# Patient Record
Sex: Female | Born: 1935 | Race: White | Hispanic: No | Marital: Married | Smoking: Never smoker
Health system: Southern US, Community
[De-identification: ages and names within clinical notes are randomized; demographics above are authoritative.]

## PROBLEM LIST (undated history)

## (undated) DIAGNOSIS — G629 Polyneuropathy, unspecified: Secondary | ICD-10-CM

## (undated) DIAGNOSIS — S22089A Unspecified fracture of T11-T12 vertebra, initial encounter for closed fracture: Secondary | ICD-10-CM

## (undated) DIAGNOSIS — F329 Major depressive disorder, single episode, unspecified: Secondary | ICD-10-CM

## (undated) DIAGNOSIS — D0510 Intraductal carcinoma in situ of unspecified breast: Secondary | ICD-10-CM

## (undated) DIAGNOSIS — R232 Flushing: Principal | ICD-10-CM

## (undated) DIAGNOSIS — I351 Nonrheumatic aortic (valve) insufficiency: Secondary | ICD-10-CM

## (undated) DIAGNOSIS — E039 Hypothyroidism, unspecified: Secondary | ICD-10-CM

## (undated) DIAGNOSIS — R1310 Dysphagia, unspecified: Principal | ICD-10-CM

## (undated) DIAGNOSIS — I2510 Atherosclerotic heart disease of native coronary artery without angina pectoris: Secondary | ICD-10-CM

## (undated) DIAGNOSIS — C9000 Multiple myeloma not having achieved remission: Secondary | ICD-10-CM

## (undated) DIAGNOSIS — C50919 Malignant neoplasm of unspecified site of unspecified female breast: Secondary | ICD-10-CM

## (undated) DIAGNOSIS — I10 Essential (primary) hypertension: Secondary | ICD-10-CM

## (undated) DIAGNOSIS — F411 Generalized anxiety disorder: Secondary | ICD-10-CM

## (undated) HISTORY — DX: Intraductal carcinoma in situ of unspecified breast: D05.10

## (undated) HISTORY — DX: Multiple myeloma not having achieved remission: C90.00

## (undated) HISTORY — DX: Major depressive disorder, single episode, unspecified: F32.9

## (undated) HISTORY — DX: Hypothyroidism, unspecified: E03.9

## (undated) HISTORY — DX: Flushing: R23.2

## (undated) HISTORY — PX: INCISIONAL HERNIA REPAIR: SHX193

## (undated) HISTORY — DX: Dysphagia, unspecified: R13.10

## (undated) HISTORY — DX: Nonrheumatic aortic (valve) insufficiency: I35.1

## (undated) HISTORY — DX: Malignant neoplasm of unspecified site of unspecified female breast: C50.919

## (undated) HISTORY — PX: OTHER SURGICAL HISTORY: SHX169

## (undated) HISTORY — DX: Polyneuropathy, unspecified: G62.9

## (undated) HISTORY — DX: Essential (primary) hypertension: I10

---

## 1998-05-20 ENCOUNTER — Ambulatory Visit (HOSPITAL_COMMUNITY): Admission: RE | Admit: 1998-05-20 | Discharge: 1998-05-20 | Payer: Self-pay | Admitting: Emergency Medicine

## 1998-06-29 ENCOUNTER — Emergency Department (HOSPITAL_COMMUNITY): Admission: EM | Admit: 1998-06-29 | Discharge: 1998-06-29 | Payer: Self-pay

## 1998-07-24 ENCOUNTER — Observation Stay (HOSPITAL_COMMUNITY): Admission: RE | Admit: 1998-07-24 | Discharge: 1998-07-25 | Payer: Self-pay | Admitting: General Surgery

## 1998-07-24 ENCOUNTER — Encounter: Payer: Self-pay | Admitting: General Surgery

## 1999-04-13 ENCOUNTER — Other Ambulatory Visit: Admission: RE | Admit: 1999-04-13 | Discharge: 1999-04-13 | Payer: Self-pay | Admitting: Gynecology

## 1999-04-30 ENCOUNTER — Encounter: Admission: RE | Admit: 1999-04-30 | Discharge: 1999-04-30 | Payer: Self-pay | Admitting: Gynecology

## 1999-04-30 ENCOUNTER — Encounter: Payer: Self-pay | Admitting: Gynecology

## 1999-05-05 ENCOUNTER — Encounter: Admission: RE | Admit: 1999-05-05 | Discharge: 1999-05-05 | Payer: Self-pay | Admitting: Emergency Medicine

## 1999-05-05 ENCOUNTER — Encounter: Payer: Self-pay | Admitting: Emergency Medicine

## 2000-03-28 ENCOUNTER — Encounter: Payer: Self-pay | Admitting: Emergency Medicine

## 2000-03-28 ENCOUNTER — Emergency Department (HOSPITAL_COMMUNITY): Admission: EM | Admit: 2000-03-28 | Discharge: 2000-03-28 | Payer: Self-pay | Admitting: Emergency Medicine

## 2000-05-12 ENCOUNTER — Encounter: Payer: Self-pay | Admitting: Emergency Medicine

## 2000-05-12 ENCOUNTER — Encounter: Admission: RE | Admit: 2000-05-12 | Discharge: 2000-05-12 | Payer: Self-pay | Admitting: Emergency Medicine

## 2000-09-07 ENCOUNTER — Other Ambulatory Visit: Admission: RE | Admit: 2000-09-07 | Discharge: 2000-09-07 | Payer: Self-pay | Admitting: Gynecology

## 2000-09-13 ENCOUNTER — Encounter: Payer: Self-pay | Admitting: Gynecology

## 2000-09-13 ENCOUNTER — Encounter: Admission: RE | Admit: 2000-09-13 | Discharge: 2000-09-13 | Payer: Self-pay | Admitting: Gynecology

## 2001-04-22 ENCOUNTER — Encounter (INDEPENDENT_AMBULATORY_CARE_PROVIDER_SITE_OTHER): Payer: Self-pay | Admitting: *Deleted

## 2001-04-22 ENCOUNTER — Ambulatory Visit (HOSPITAL_BASED_OUTPATIENT_CLINIC_OR_DEPARTMENT_OTHER): Admission: RE | Admit: 2001-04-22 | Discharge: 2001-04-22 | Payer: Self-pay | Admitting: Surgery

## 2001-09-28 ENCOUNTER — Encounter: Payer: Self-pay | Admitting: Specialist

## 2001-09-28 ENCOUNTER — Encounter: Admission: RE | Admit: 2001-09-28 | Discharge: 2001-09-28 | Payer: Self-pay | Admitting: Specialist

## 2001-09-29 ENCOUNTER — Ambulatory Visit (HOSPITAL_BASED_OUTPATIENT_CLINIC_OR_DEPARTMENT_OTHER): Admission: RE | Admit: 2001-09-29 | Discharge: 2001-09-29 | Payer: Self-pay | Admitting: Specialist

## 2001-12-08 ENCOUNTER — Encounter: Payer: Self-pay | Admitting: Emergency Medicine

## 2001-12-08 ENCOUNTER — Encounter: Admission: RE | Admit: 2001-12-08 | Discharge: 2001-12-08 | Payer: Self-pay | Admitting: Emergency Medicine

## 2002-02-06 ENCOUNTER — Other Ambulatory Visit: Admission: RE | Admit: 2002-02-06 | Discharge: 2002-02-06 | Payer: Self-pay | Admitting: Gynecology

## 2003-02-05 ENCOUNTER — Encounter: Admission: RE | Admit: 2003-02-05 | Discharge: 2003-02-05 | Payer: Self-pay | Admitting: Obstetrics and Gynecology

## 2003-02-05 ENCOUNTER — Encounter: Payer: Self-pay | Admitting: Obstetrics and Gynecology

## 2003-06-07 ENCOUNTER — Encounter: Admission: RE | Admit: 2003-06-07 | Discharge: 2003-06-07 | Payer: Self-pay | Admitting: Emergency Medicine

## 2003-06-20 ENCOUNTER — Encounter: Admission: RE | Admit: 2003-06-20 | Discharge: 2003-06-20 | Payer: Self-pay | Admitting: Obstetrics and Gynecology

## 2003-07-04 ENCOUNTER — Encounter: Admission: RE | Admit: 2003-07-04 | Discharge: 2003-07-04 | Payer: Self-pay | Admitting: Obstetrics and Gynecology

## 2003-08-29 ENCOUNTER — Ambulatory Visit (HOSPITAL_COMMUNITY): Admission: RE | Admit: 2003-08-29 | Discharge: 2003-08-29 | Payer: Self-pay | Admitting: Gastroenterology

## 2004-07-02 ENCOUNTER — Ambulatory Visit (HOSPITAL_BASED_OUTPATIENT_CLINIC_OR_DEPARTMENT_OTHER): Admission: RE | Admit: 2004-07-02 | Discharge: 2004-07-02 | Payer: Self-pay | Admitting: General Surgery

## 2004-07-02 ENCOUNTER — Ambulatory Visit (HOSPITAL_COMMUNITY): Admission: RE | Admit: 2004-07-02 | Discharge: 2004-07-02 | Payer: Self-pay | Admitting: General Surgery

## 2004-10-07 ENCOUNTER — Encounter: Admission: RE | Admit: 2004-10-07 | Discharge: 2004-10-07 | Payer: Self-pay | Admitting: Obstetrics and Gynecology

## 2004-12-16 ENCOUNTER — Other Ambulatory Visit: Admission: RE | Admit: 2004-12-16 | Discharge: 2004-12-16 | Payer: Self-pay | Admitting: Obstetrics and Gynecology

## 2005-01-13 ENCOUNTER — Ambulatory Visit: Payer: Self-pay | Admitting: Cardiovascular Disease

## 2005-01-19 ENCOUNTER — Ambulatory Visit: Payer: Self-pay

## 2005-01-23 ENCOUNTER — Encounter: Admission: RE | Admit: 2005-01-23 | Discharge: 2005-01-23 | Payer: Self-pay | Admitting: General Surgery

## 2005-01-27 ENCOUNTER — Ambulatory Visit (HOSPITAL_COMMUNITY): Admission: RE | Admit: 2005-01-27 | Discharge: 2005-01-27 | Payer: Self-pay | Admitting: General Surgery

## 2005-01-27 ENCOUNTER — Ambulatory Visit (HOSPITAL_BASED_OUTPATIENT_CLINIC_OR_DEPARTMENT_OTHER): Admission: RE | Admit: 2005-01-27 | Discharge: 2005-01-27 | Payer: Self-pay | Admitting: General Surgery

## 2005-01-27 ENCOUNTER — Encounter (INDEPENDENT_AMBULATORY_CARE_PROVIDER_SITE_OTHER): Payer: Self-pay | Admitting: *Deleted

## 2005-03-13 ENCOUNTER — Ambulatory Visit: Payer: Self-pay | Admitting: Cardiovascular Disease

## 2005-04-17 ENCOUNTER — Ambulatory Visit: Payer: Self-pay | Admitting: Cardiovascular Disease

## 2005-06-04 ENCOUNTER — Ambulatory Visit: Payer: Self-pay

## 2005-06-04 ENCOUNTER — Encounter: Payer: Self-pay | Admitting: Internal Medicine

## 2005-06-08 ENCOUNTER — Ambulatory Visit: Payer: Self-pay | Admitting: Cardiology

## 2005-06-08 ENCOUNTER — Ambulatory Visit: Payer: Self-pay | Admitting: Cardiovascular Disease

## 2005-10-14 ENCOUNTER — Encounter: Admission: RE | Admit: 2005-10-14 | Discharge: 2005-10-14 | Payer: Self-pay | Admitting: Obstetrics and Gynecology

## 2005-12-02 ENCOUNTER — Ambulatory Visit: Payer: Self-pay | Admitting: Cardiovascular Disease

## 2006-03-11 ENCOUNTER — Ambulatory Visit: Payer: Self-pay | Admitting: Cardiovascular Disease

## 2006-05-13 ENCOUNTER — Encounter: Payer: Self-pay | Admitting: Cardiology

## 2006-05-13 ENCOUNTER — Ambulatory Visit: Payer: Self-pay

## 2006-06-08 ENCOUNTER — Ambulatory Visit: Payer: Self-pay | Admitting: Cardiovascular Disease

## 2006-10-21 ENCOUNTER — Encounter: Admission: RE | Admit: 2006-10-21 | Discharge: 2006-10-21 | Payer: Self-pay | Admitting: Family Medicine

## 2007-06-10 ENCOUNTER — Ambulatory Visit: Payer: Self-pay | Admitting: Cardiovascular Disease

## 2007-06-21 ENCOUNTER — Encounter: Payer: Self-pay | Admitting: Cardiovascular Disease

## 2007-06-21 ENCOUNTER — Ambulatory Visit: Payer: Self-pay

## 2007-08-03 ENCOUNTER — Encounter: Admission: RE | Admit: 2007-08-03 | Discharge: 2007-08-03 | Payer: Self-pay | Admitting: Obstetrics and Gynecology

## 2007-08-31 ENCOUNTER — Encounter: Admission: RE | Admit: 2007-08-31 | Discharge: 2007-08-31 | Payer: Self-pay | Admitting: Emergency Medicine

## 2007-11-17 ENCOUNTER — Ambulatory Visit: Payer: Self-pay

## 2008-01-04 ENCOUNTER — Encounter: Admission: RE | Admit: 2008-01-04 | Discharge: 2008-01-04 | Payer: Self-pay | Admitting: Obstetrics and Gynecology

## 2008-01-23 ENCOUNTER — Encounter: Admission: RE | Admit: 2008-01-23 | Discharge: 2008-01-23 | Payer: Self-pay | Admitting: General Surgery

## 2008-01-31 ENCOUNTER — Encounter: Admission: RE | Admit: 2008-01-31 | Discharge: 2008-01-31 | Payer: Self-pay | Admitting: General Surgery

## 2008-02-22 ENCOUNTER — Ambulatory Visit: Payer: Self-pay | Admitting: Hematology

## 2008-03-01 ENCOUNTER — Ambulatory Visit: Admission: RE | Admit: 2008-03-01 | Discharge: 2008-05-30 | Payer: Self-pay | Admitting: Radiation Oncology

## 2008-05-10 ENCOUNTER — Ambulatory Visit: Payer: Self-pay | Admitting: Hematology

## 2008-05-10 LAB — COMPREHENSIVE METABOLIC PANEL
ALT: 12 U/L (ref 0–35)
Albumin: 3.6 g/dL (ref 3.5–5.2)
CO2: 25 mEq/L (ref 19–32)
Calcium: 8.9 mg/dL (ref 8.4–10.5)
Chloride: 103 mEq/L (ref 96–112)
Glucose, Bld: 77 mg/dL (ref 70–99)
Potassium: 4.2 mEq/L (ref 3.5–5.3)
Sodium: 137 mEq/L (ref 135–145)
Total Protein: 6.8 g/dL (ref 6.0–8.3)

## 2008-05-10 LAB — CBC WITH DIFFERENTIAL/PLATELET
BASO%: 0.4 % (ref 0.0–2.0)
Eosinophils Absolute: 0 10*3/uL (ref 0.0–0.5)
LYMPH%: 25.5 % (ref 14.0–48.0)
MCHC: 34.8 g/dL (ref 32.0–36.0)
MONO#: 0.2 10*3/uL (ref 0.1–0.9)
NEUT#: 1.6 10*3/uL (ref 1.5–6.5)
Platelets: 189 10*3/uL (ref 145–400)
RBC: 3.34 10*6/uL — ABNORMAL LOW (ref 3.70–5.32)
RDW: 13.7 % (ref 11.3–14.5)
WBC: 2.5 10*3/uL — ABNORMAL LOW (ref 3.9–10.0)
lymph#: 0.6 10*3/uL — ABNORMAL LOW (ref 0.9–3.3)

## 2008-06-05 ENCOUNTER — Ambulatory Visit: Payer: Self-pay | Admitting: Cardiovascular Disease

## 2008-06-21 ENCOUNTER — Ambulatory Visit: Payer: Self-pay

## 2008-06-21 ENCOUNTER — Encounter: Payer: Self-pay | Admitting: Cardiovascular Disease

## 2008-08-06 ENCOUNTER — Ambulatory Visit: Payer: Self-pay | Admitting: Oncology

## 2008-08-08 LAB — COMPREHENSIVE METABOLIC PANEL
Albumin: 3.6 g/dL (ref 3.5–5.2)
Alkaline Phosphatase: 36 U/L — ABNORMAL LOW (ref 39–117)
BUN: 19 mg/dL (ref 6–23)
CO2: 23 mEq/L (ref 19–32)
Calcium: 8.2 mg/dL — ABNORMAL LOW (ref 8.4–10.5)
Chloride: 102 mEq/L (ref 96–112)
Glucose, Bld: 91 mg/dL (ref 70–99)
Potassium: 4.6 mEq/L (ref 3.5–5.3)
Sodium: 140 mEq/L (ref 135–145)
Total Protein: 6.8 g/dL (ref 6.0–8.3)

## 2008-08-08 LAB — CBC WITH DIFFERENTIAL/PLATELET
Basophils Absolute: 0 10*3/uL (ref 0.0–0.1)
Eosinophils Absolute: 0 10*3/uL (ref 0.0–0.5)
HGB: 10.9 g/dL — ABNORMAL LOW (ref 11.6–15.9)
MONO#: 0.2 10*3/uL (ref 0.1–0.9)
MONO%: 5.7 % (ref 0.0–13.0)
NEUT#: 2.4 10*3/uL (ref 1.5–6.5)
RBC: 3.08 10*6/uL — ABNORMAL LOW (ref 3.70–5.32)
RDW: 14.2 % (ref 11.3–14.5)
WBC: 3.5 10*3/uL — ABNORMAL LOW (ref 3.9–10.0)
lymph#: 0.9 10*3/uL (ref 0.9–3.3)

## 2008-08-21 ENCOUNTER — Encounter: Admission: RE | Admit: 2008-08-21 | Discharge: 2008-08-21 | Payer: Self-pay | Admitting: Oncology

## 2008-11-01 ENCOUNTER — Ambulatory Visit: Payer: Self-pay | Admitting: Oncology

## 2008-11-15 ENCOUNTER — Other Ambulatory Visit: Admission: RE | Admit: 2008-11-15 | Discharge: 2008-11-15 | Payer: Self-pay | Admitting: Oncology

## 2008-11-15 ENCOUNTER — Ambulatory Visit (HOSPITAL_COMMUNITY): Admission: RE | Admit: 2008-11-15 | Discharge: 2008-11-15 | Payer: Self-pay | Admitting: Oncology

## 2008-11-15 ENCOUNTER — Encounter: Payer: Self-pay | Admitting: Oncology

## 2008-11-15 LAB — CBC WITH DIFFERENTIAL/PLATELET
Basophils Absolute: 0 10*3/uL (ref 0.0–0.1)
EOS%: 0.4 % (ref 0.0–7.0)
Eosinophils Absolute: 0 10*3/uL (ref 0.0–0.5)
HCT: 31.4 % — ABNORMAL LOW (ref 34.8–46.6)
HGB: 10.9 g/dL — ABNORMAL LOW (ref 11.6–15.9)
MCH: 35.2 pg — ABNORMAL HIGH (ref 25.1–34.0)
NEUT#: 1.4 10*3/uL — ABNORMAL LOW (ref 1.5–6.5)
NEUT%: 59.5 % (ref 38.4–76.8)
RDW: 13.4 % (ref 11.2–14.5)
lymph#: 0.8 10*3/uL — ABNORMAL LOW (ref 0.9–3.3)

## 2008-11-16 LAB — KAPPA/LAMBDA LIGHT CHAINS
Kappa free light chain: 4.29 mg/dL — ABNORMAL HIGH (ref 0.33–1.94)
Kappa:Lambda Ratio: 3.49 — ABNORMAL HIGH (ref 0.26–1.65)
Lambda Free Lght Chn: 1.23 mg/dL (ref 0.57–2.63)

## 2008-12-17 ENCOUNTER — Ambulatory Visit: Payer: Self-pay | Admitting: Oncology

## 2008-12-19 LAB — CBC WITH DIFFERENTIAL/PLATELET
BASO%: 0 % (ref 0.0–2.0)
EOS%: 0.2 % (ref 0.0–7.0)
MCH: 35.9 pg — ABNORMAL HIGH (ref 25.1–34.0)
MCHC: 35.3 g/dL (ref 31.5–36.0)
RBC: 2.98 10*6/uL — ABNORMAL LOW (ref 3.70–5.45)
RDW: 13.6 % (ref 11.2–14.5)
lymph#: 0.7 10*3/uL — ABNORMAL LOW (ref 0.9–3.3)

## 2008-12-19 LAB — BASIC METABOLIC PANEL
Chloride: 102 mEq/L (ref 96–112)
Creatinine, Ser: 0.75 mg/dL (ref 0.40–1.20)
Potassium: 4.2 mEq/L (ref 3.5–5.3)
Sodium: 138 mEq/L (ref 135–145)

## 2008-12-24 LAB — IMMUNOFIXATION ELECTROPHORESIS
IgA: 30 mg/dL — ABNORMAL LOW (ref 68–378)
IgG (Immunoglobin G), Serum: 1960 mg/dL — ABNORMAL HIGH (ref 694–1618)

## 2009-01-03 DIAGNOSIS — L259 Unspecified contact dermatitis, unspecified cause: Secondary | ICD-10-CM

## 2009-01-03 DIAGNOSIS — M129 Arthropathy, unspecified: Secondary | ICD-10-CM | POA: Insufficient documentation

## 2009-01-03 DIAGNOSIS — Z780 Asymptomatic menopausal state: Secondary | ICD-10-CM | POA: Insufficient documentation

## 2009-01-04 ENCOUNTER — Ambulatory Visit: Payer: Self-pay | Admitting: Cardiovascular Disease

## 2009-01-04 DIAGNOSIS — C9002 Multiple myeloma in relapse: Secondary | ICD-10-CM | POA: Insufficient documentation

## 2009-01-04 DIAGNOSIS — I10 Essential (primary) hypertension: Secondary | ICD-10-CM | POA: Insufficient documentation

## 2009-01-10 LAB — COMPREHENSIVE METABOLIC PANEL
Albumin: 3.3 g/dL — ABNORMAL LOW (ref 3.5–5.2)
BUN: 19 mg/dL (ref 6–23)
Calcium: 8.7 mg/dL (ref 8.4–10.5)
Chloride: 100 mEq/L (ref 96–112)
Glucose, Bld: 91 mg/dL (ref 70–99)
Potassium: 4.5 mEq/L (ref 3.5–5.3)

## 2009-01-10 LAB — CBC WITH DIFFERENTIAL/PLATELET
Basophils Absolute: 0 10*3/uL (ref 0.0–0.1)
Eosinophils Absolute: 0 10*3/uL (ref 0.0–0.5)
HCT: 29.3 % — ABNORMAL LOW (ref 34.8–46.6)
HGB: 10.1 g/dL — ABNORMAL LOW (ref 11.6–15.9)
MCV: 100.9 fL (ref 79.5–101.0)
NEUT#: 2.6 10*3/uL (ref 1.5–6.5)
NEUT%: 77.6 % — ABNORMAL HIGH (ref 38.4–76.8)
RDW: 13.9 % (ref 11.2–14.5)
lymph#: 0.7 10*3/uL — ABNORMAL LOW (ref 0.9–3.3)

## 2009-01-10 LAB — URIC ACID: Uric Acid, Serum: 4.8 mg/dL (ref 2.4–7.0)

## 2009-01-10 LAB — LACTATE DEHYDROGENASE: LDH: 117 U/L (ref 94–250)

## 2009-01-15 ENCOUNTER — Ambulatory Visit: Payer: Self-pay | Admitting: Oncology

## 2009-01-17 LAB — COMPREHENSIVE METABOLIC PANEL
Albumin: 3.3 g/dL — ABNORMAL LOW (ref 3.5–5.2)
CO2: 26 mEq/L (ref 19–32)
Calcium: 8.5 mg/dL (ref 8.4–10.5)
Chloride: 103 mEq/L (ref 96–112)
Glucose, Bld: 90 mg/dL (ref 70–99)
Sodium: 136 mEq/L (ref 135–145)
Total Bilirubin: 0.4 mg/dL (ref 0.3–1.2)
Total Protein: 6.2 g/dL (ref 6.0–8.3)

## 2009-01-17 LAB — LACTATE DEHYDROGENASE: LDH: 130 U/L (ref 94–250)

## 2009-01-17 LAB — CBC WITH DIFFERENTIAL/PLATELET
Eosinophils Absolute: 0 10*3/uL (ref 0.0–0.5)
HCT: 28.5 % — ABNORMAL LOW (ref 34.8–46.6)
LYMPH%: 21.3 % (ref 14.0–49.7)
MONO#: 0.2 10*3/uL (ref 0.1–0.9)
NEUT#: 1.7 10*3/uL (ref 1.5–6.5)
Platelets: 110 10*3/uL — ABNORMAL LOW (ref 145–400)
RBC: 2.88 10*6/uL — ABNORMAL LOW (ref 3.70–5.45)
WBC: 2.5 10*3/uL — ABNORMAL LOW (ref 3.9–10.3)

## 2009-01-17 LAB — URIC ACID: Uric Acid, Serum: 4.1 mg/dL (ref 2.4–7.0)

## 2009-01-28 ENCOUNTER — Telehealth: Payer: Self-pay | Admitting: Cardiovascular Disease

## 2009-01-28 LAB — CBC WITH DIFFERENTIAL/PLATELET
BASO%: 1.3 % (ref 0.0–2.0)
EOS%: 0.6 % (ref 0.0–7.0)
HGB: 9.2 g/dL — ABNORMAL LOW (ref 11.6–15.9)
MCH: 33.5 pg (ref 25.1–34.0)
MCHC: 34.1 g/dL (ref 31.5–36.0)
MONO%: 4.4 % (ref 0.0–14.0)
RBC: 2.75 10*6/uL — ABNORMAL LOW (ref 3.70–5.45)
RDW: 14.8 % — ABNORMAL HIGH (ref 11.2–14.5)
lymph#: 0.7 10*3/uL — ABNORMAL LOW (ref 0.9–3.3)

## 2009-01-30 LAB — SPEP & IFE WITH QIG
Albumin ELP: 51.1 % — ABNORMAL LOW (ref 55.8–66.1)
Beta 2: 2.3 % — ABNORMAL LOW (ref 3.2–6.5)
Gamma Globulin: 24.6 % — ABNORMAL HIGH (ref 11.1–18.8)
IgA: 32 mg/dL — ABNORMAL LOW (ref 68–378)
IgM, Serum: 92 mg/dL (ref 60–263)

## 2009-01-30 LAB — KAPPA/LAMBDA LIGHT CHAINS
Kappa:Lambda Ratio: 2.34 — ABNORMAL HIGH (ref 0.26–1.65)
Lambda Free Lght Chn: 3.41 mg/dL — ABNORMAL HIGH (ref 0.57–2.63)

## 2009-01-30 LAB — COMPREHENSIVE METABOLIC PANEL
AST: 15 U/L (ref 0–37)
Alkaline Phosphatase: 48 U/L (ref 39–117)
Glucose, Bld: 87 mg/dL (ref 70–99)
Sodium: 140 mEq/L (ref 135–145)
Total Bilirubin: 0.3 mg/dL (ref 0.3–1.2)
Total Protein: 6.3 g/dL (ref 6.0–8.3)

## 2009-02-05 LAB — CBC WITH DIFFERENTIAL/PLATELET
Basophils Absolute: 0 10*3/uL (ref 0.0–0.1)
Eosinophils Absolute: 0 10*3/uL (ref 0.0–0.5)
HCT: 26.6 % — ABNORMAL LOW (ref 34.8–46.6)
HGB: 8.9 g/dL — ABNORMAL LOW (ref 11.6–15.9)
LYMPH%: 66.7 % — ABNORMAL HIGH (ref 14.0–49.7)
MCV: 98.9 fL (ref 79.5–101.0)
MONO%: 3.7 % (ref 0.0–14.0)
NEUT#: 0.3 10*3/uL — CL (ref 1.5–6.5)
NEUT%: 27.7 % — ABNORMAL LOW (ref 38.4–76.8)
Platelets: 119 10*3/uL — ABNORMAL LOW (ref 145–400)

## 2009-02-18 ENCOUNTER — Ambulatory Visit: Payer: Self-pay | Admitting: Oncology

## 2009-02-20 LAB — CBC WITH DIFFERENTIAL/PLATELET
BASO%: 0.7 % (ref 0.0–2.0)
EOS%: 0.6 % (ref 0.0–7.0)
HCT: 28.2 % — ABNORMAL LOW (ref 34.8–46.6)
MCH: 35.4 pg — ABNORMAL HIGH (ref 25.1–34.0)
MCHC: 34.3 g/dL (ref 31.5–36.0)
MONO#: 0.3 10*3/uL (ref 0.1–0.9)
NEUT%: 59.2 % (ref 38.4–76.8)
RBC: 2.73 10*6/uL — ABNORMAL LOW (ref 3.70–5.45)
WBC: 3.5 10*3/uL — ABNORMAL LOW (ref 3.9–10.3)
lymph#: 1.1 10*3/uL (ref 0.9–3.3)

## 2009-02-20 LAB — BASIC METABOLIC PANEL
Calcium: 9 mg/dL (ref 8.4–10.5)
Chloride: 103 mEq/L (ref 96–112)
Creatinine, Ser: 1.03 mg/dL (ref 0.40–1.20)
Sodium: 136 mEq/L (ref 135–145)

## 2009-02-22 ENCOUNTER — Telehealth: Payer: Self-pay | Admitting: Cardiovascular Disease

## 2009-03-13 LAB — CBC WITH DIFFERENTIAL/PLATELET
BASO%: 0.2 % (ref 0.0–2.0)
Basophils Absolute: 0 10*3/uL (ref 0.0–0.1)
EOS%: 0.9 % (ref 0.0–7.0)
HCT: 27 % — ABNORMAL LOW (ref 34.8–46.6)
HGB: 9.4 g/dL — ABNORMAL LOW (ref 11.6–15.9)
LYMPH%: 11.8 % — ABNORMAL LOW (ref 14.0–49.7)
MCH: 36.1 pg — ABNORMAL HIGH (ref 25.1–34.0)
MCHC: 35 g/dL (ref 31.5–36.0)
MCV: 103.3 fL — ABNORMAL HIGH (ref 79.5–101.0)
NEUT%: 80 % — ABNORMAL HIGH (ref 38.4–76.8)
Platelets: 181 10*3/uL (ref 145–400)

## 2009-03-18 ENCOUNTER — Ambulatory Visit: Payer: Self-pay | Admitting: Oncology

## 2009-03-20 LAB — COMPREHENSIVE METABOLIC PANEL
AST: 21 U/L (ref 0–37)
Albumin: 3.3 g/dL — ABNORMAL LOW (ref 3.5–5.2)
BUN: 19 mg/dL (ref 6–23)
Calcium: 8.8 mg/dL (ref 8.4–10.5)
Chloride: 103 mEq/L (ref 96–112)
Potassium: 3.9 mEq/L (ref 3.5–5.3)

## 2009-03-20 LAB — CBC WITH DIFFERENTIAL/PLATELET
BASO%: 0.9 % (ref 0.0–2.0)
EOS%: 2 % (ref 0.0–7.0)
MCH: 34.3 pg — ABNORMAL HIGH (ref 25.1–34.0)
MCHC: 33.8 g/dL (ref 31.5–36.0)
MCV: 101.5 fL — ABNORMAL HIGH (ref 79.5–101.0)
MONO%: 9.9 % (ref 0.0–14.0)
NEUT%: 67.3 % (ref 38.4–76.8)
RDW: 16.7 % — ABNORMAL HIGH (ref 11.2–14.5)
lymph#: 0.9 10*3/uL (ref 0.9–3.3)

## 2009-03-22 LAB — PROTEIN ELECTROPHORESIS, SERUM
Alpha-2-Globulin: 12.4 % — ABNORMAL HIGH (ref 7.1–11.8)
Beta Globulin: 5.4 % (ref 4.7–7.2)
Gamma Globulin: 18.6 % (ref 11.1–18.8)
M-Spike, %: 0.98 g/dL

## 2009-03-22 LAB — KAPPA/LAMBDA LIGHT CHAINS: Kappa free light chain: 2.34 mg/dL — ABNORMAL HIGH (ref 0.33–1.94)

## 2009-03-22 LAB — IGG, IGA, IGM: IgM, Serum: 70 mg/dL (ref 60–263)

## 2009-04-15 ENCOUNTER — Ambulatory Visit: Payer: Self-pay | Admitting: Oncology

## 2009-04-17 LAB — CBC WITH DIFFERENTIAL/PLATELET
Basophils Absolute: 0 10*3/uL (ref 0.0–0.1)
Eosinophils Absolute: 0.3 10*3/uL (ref 0.0–0.5)
HGB: 9.9 g/dL — ABNORMAL LOW (ref 11.6–15.9)
MCV: 101.7 fL — ABNORMAL HIGH (ref 79.5–101.0)
MONO#: 0.3 10*3/uL (ref 0.1–0.9)
MONO%: 10.7 % (ref 0.0–14.0)
NEUT#: 1.3 10*3/uL — ABNORMAL LOW (ref 1.5–6.5)
RDW: 14.4 % (ref 11.2–14.5)

## 2009-04-19 LAB — PROTEIN ELECTROPHORESIS, SERUM
Alpha-2-Globulin: 12.7 % — ABNORMAL HIGH (ref 7.1–11.8)
Beta Globulin: 5.6 % (ref 4.7–7.2)
Gamma Globulin: 17.3 % (ref 11.1–18.8)
M-Spike, %: 0.84 g/dL

## 2009-04-19 LAB — COMPREHENSIVE METABOLIC PANEL
AST: 16 U/L (ref 0–37)
Alkaline Phosphatase: 52 U/L (ref 39–117)
BUN: 17 mg/dL (ref 6–23)
Calcium: 8.2 mg/dL — ABNORMAL LOW (ref 8.4–10.5)
Chloride: 103 mEq/L (ref 96–112)
Creatinine, Ser: 0.72 mg/dL (ref 0.40–1.20)
Glucose, Bld: 91 mg/dL (ref 70–99)

## 2009-04-19 LAB — IGG, IGA, IGM
IgA: 46 mg/dL — ABNORMAL LOW (ref 68–378)
IgM, Serum: 76 mg/dL (ref 60–263)

## 2009-04-19 LAB — KAPPA/LAMBDA LIGHT CHAINS: Lambda Free Lght Chn: 2.1 mg/dL (ref 0.57–2.63)

## 2009-04-24 LAB — CBC WITH DIFFERENTIAL/PLATELET
Basophils Absolute: 0.1 10*3/uL (ref 0.0–0.1)
Eosinophils Absolute: 0.1 10*3/uL (ref 0.0–0.5)
HCT: 31.1 % — ABNORMAL LOW (ref 34.8–46.6)
HGB: 10.3 g/dL — ABNORMAL LOW (ref 11.6–15.9)
LYMPH%: 34.6 % (ref 14.0–49.7)
MCHC: 33.1 g/dL (ref 31.5–36.0)
MONO#: 0.3 10*3/uL (ref 0.1–0.9)
NEUT#: 1.2 10*3/uL — ABNORMAL LOW (ref 1.5–6.5)
NEUT%: 47.3 % (ref 38.4–76.8)
Platelets: 207 10*3/uL (ref 145–400)
WBC: 2.4 10*3/uL — ABNORMAL LOW (ref 3.9–10.3)

## 2009-05-15 ENCOUNTER — Ambulatory Visit: Payer: Self-pay | Admitting: Oncology

## 2009-05-15 LAB — CBC WITH DIFFERENTIAL/PLATELET
BASO%: 2.5 % — ABNORMAL HIGH (ref 0.0–2.0)
Basophils Absolute: 0.1 10*3/uL (ref 0.0–0.1)
EOS%: 3.2 % (ref 0.0–7.0)
HCT: 32.4 % — ABNORMAL LOW (ref 34.8–46.6)
LYMPH%: 22.3 % (ref 14.0–49.7)
MCH: 33.9 pg (ref 25.1–34.0)
MCHC: 33.6 g/dL (ref 31.5–36.0)
NEUT%: 57.8 % (ref 38.4–76.8)
Platelets: 144 10*3/uL — ABNORMAL LOW (ref 145–400)

## 2009-05-17 LAB — COMPREHENSIVE METABOLIC PANEL
Albumin: 3.9 g/dL (ref 3.5–5.2)
Alkaline Phosphatase: 53 U/L (ref 39–117)
BUN: 16 mg/dL (ref 6–23)
CO2: 26 mEq/L (ref 19–32)
Calcium: 8.8 mg/dL (ref 8.4–10.5)
Chloride: 100 mEq/L (ref 96–112)
Glucose, Bld: 76 mg/dL (ref 70–99)
Potassium: 4.1 mEq/L (ref 3.5–5.3)

## 2009-05-17 LAB — SPEP & IFE WITH QIG
Alpha-1-Globulin: 4.7 % (ref 2.9–4.9)
Alpha-2-Globulin: 12.6 % — ABNORMAL HIGH (ref 7.1–11.8)
Beta 2: 2.7 % — ABNORMAL LOW (ref 3.2–6.5)
Beta Globulin: 5.8 % (ref 4.7–7.2)
Gamma Globulin: 14.3 % (ref 11.1–18.8)

## 2009-05-17 LAB — KAPPA/LAMBDA LIGHT CHAINS
Kappa free light chain: 1.79 mg/dL (ref 0.33–1.94)
Kappa:Lambda Ratio: 1.13 (ref 0.26–1.65)
Lambda Free Lght Chn: 1.58 mg/dL (ref 0.57–2.63)

## 2009-06-03 ENCOUNTER — Encounter: Payer: Self-pay | Admitting: Cardiovascular Disease

## 2009-06-04 ENCOUNTER — Ambulatory Visit: Payer: Self-pay

## 2009-06-04 ENCOUNTER — Ambulatory Visit (HOSPITAL_COMMUNITY): Admission: RE | Admit: 2009-06-04 | Discharge: 2009-06-04 | Payer: Self-pay | Admitting: Cardiovascular Disease

## 2009-06-04 ENCOUNTER — Ambulatory Visit: Payer: Self-pay | Admitting: Cardiology

## 2009-06-04 ENCOUNTER — Encounter: Payer: Self-pay | Admitting: Cardiovascular Disease

## 2009-06-05 ENCOUNTER — Ambulatory Visit: Payer: Self-pay | Admitting: Cardiovascular Disease

## 2009-06-05 DIAGNOSIS — I447 Left bundle-branch block, unspecified: Secondary | ICD-10-CM

## 2009-06-13 ENCOUNTER — Ambulatory Visit: Payer: Self-pay | Admitting: Oncology

## 2009-06-18 LAB — CBC WITH DIFFERENTIAL/PLATELET
Basophils Absolute: 0 10*3/uL (ref 0.0–0.1)
EOS%: 6.1 % (ref 0.0–7.0)
HGB: 10 g/dL — ABNORMAL LOW (ref 11.6–15.9)
MCH: 32.8 pg (ref 25.1–34.0)
MCV: 100.7 fL (ref 79.5–101.0)
MONO%: 17.8 % — ABNORMAL HIGH (ref 0.0–14.0)
NEUT#: 1.4 10*3/uL — ABNORMAL LOW (ref 1.5–6.5)
RBC: 3.05 10*6/uL — ABNORMAL LOW (ref 3.70–5.45)
RDW: 15.6 % — ABNORMAL HIGH (ref 11.2–14.5)
lymph#: 0.6 10*3/uL — ABNORMAL LOW (ref 0.9–3.3)
nRBC: 0 % (ref 0–0)

## 2009-06-20 LAB — PROTEIN ELECTROPHORESIS, SERUM
Albumin ELP: 63.4 % (ref 55.8–66.1)
Alpha-1-Globulin: 4.7 % (ref 2.9–4.9)
Alpha-2-Globulin: 11.4 % (ref 7.1–11.8)
Beta 2: 2.1 % — ABNORMAL LOW (ref 3.2–6.5)
Gamma Globulin: 12.6 % (ref 11.1–18.8)

## 2009-06-20 LAB — COMPREHENSIVE METABOLIC PANEL
AST: 30 U/L (ref 0–37)
Albumin: 3.5 g/dL (ref 3.5–5.2)
Alkaline Phosphatase: 48 U/L (ref 39–117)
Glucose, Bld: 84 mg/dL (ref 70–99)
Potassium: 4.1 mEq/L (ref 3.5–5.3)
Sodium: 137 mEq/L (ref 135–145)
Total Bilirubin: 0.5 mg/dL (ref 0.3–1.2)
Total Protein: 5.6 g/dL — ABNORMAL LOW (ref 6.0–8.3)

## 2009-06-20 LAB — IGG, IGA, IGM
IgA: 41 mg/dL — ABNORMAL LOW (ref 68–378)
IgM, Serum: 79 mg/dL (ref 60–263)

## 2009-06-20 LAB — KAPPA/LAMBDA LIGHT CHAINS: Lambda Free Lght Chn: 2.03 mg/dL (ref 0.57–2.63)

## 2009-06-21 ENCOUNTER — Encounter: Payer: Self-pay | Admitting: Oncology

## 2009-06-21 ENCOUNTER — Ambulatory Visit: Admission: RE | Admit: 2009-06-21 | Discharge: 2009-06-21 | Payer: Self-pay | Admitting: Oncology

## 2009-06-21 ENCOUNTER — Ambulatory Visit: Payer: Self-pay | Admitting: Vascular Surgery

## 2009-07-11 ENCOUNTER — Ambulatory Visit: Payer: Self-pay | Admitting: Oncology

## 2009-07-15 LAB — CBC WITH DIFFERENTIAL/PLATELET
BASO%: 1.2 % (ref 0.0–2.0)
Basophils Absolute: 0 10*3/uL (ref 0.0–0.1)
EOS%: 4.2 % (ref 0.0–7.0)
Eosinophils Absolute: 0.1 10*3/uL (ref 0.0–0.5)
MONO#: 0.5 10*3/uL (ref 0.1–0.9)
MONO%: 13.9 % (ref 0.0–14.0)
NEUT%: 56.1 % (ref 38.4–76.8)
RDW: 15.5 % — ABNORMAL HIGH (ref 11.2–14.5)
WBC: 3.4 10*3/uL — ABNORMAL LOW (ref 3.9–10.3)
lymph#: 0.8 10*3/uL — ABNORMAL LOW (ref 0.9–3.3)
nRBC: 0 % (ref 0–0)

## 2009-07-17 LAB — PROTEIN ELECTROPHORESIS, SERUM
Alpha-1-Globulin: 4.2 % (ref 2.9–4.9)
Beta Globulin: 5.6 % (ref 4.7–7.2)
Gamma Globulin: 12.6 % (ref 11.1–18.8)
M-Spike, %: 0.41 g/dL
Total Protein, Serum Electrophoresis: 5.4 g/dL — ABNORMAL LOW (ref 6.0–8.3)

## 2009-07-17 LAB — COMPREHENSIVE METABOLIC PANEL
ALT: 23 U/L (ref 0–35)
BUN: 15 mg/dL (ref 6–23)
CO2: 26 mEq/L (ref 19–32)
Chloride: 103 mEq/L (ref 96–112)
Creatinine, Ser: 0.93 mg/dL (ref 0.40–1.20)
Glucose, Bld: 83 mg/dL (ref 70–99)
Sodium: 136 mEq/L (ref 135–145)

## 2009-07-26 ENCOUNTER — Telehealth: Payer: Self-pay | Admitting: Cardiovascular Disease

## 2009-08-12 ENCOUNTER — Ambulatory Visit: Payer: Self-pay | Admitting: Oncology

## 2009-08-12 LAB — CBC WITH DIFFERENTIAL/PLATELET
BASO%: 0.5 % (ref 0.0–2.0)
EOS%: 1.1 % (ref 0.0–7.0)
HGB: 11.1 g/dL — ABNORMAL LOW (ref 11.6–15.9)
MCH: 33.3 pg (ref 25.1–34.0)
MCHC: 33.3 g/dL (ref 31.5–36.0)
RBC: 3.33 10*6/uL — ABNORMAL LOW (ref 3.70–5.45)
RDW: 15.3 % — ABNORMAL HIGH (ref 11.2–14.5)
lymph#: 1.2 10*3/uL (ref 0.9–3.3)
nRBC: 0 % (ref 0–0)

## 2009-08-12 LAB — COMPREHENSIVE METABOLIC PANEL
AST: 29 U/L (ref 0–37)
Albumin: 3.7 g/dL (ref 3.5–5.2)
Alkaline Phosphatase: 50 U/L (ref 39–117)
Potassium: 4 mEq/L (ref 3.5–5.3)
Sodium: 134 mEq/L — ABNORMAL LOW (ref 135–145)
Total Protein: 6.5 g/dL (ref 6.0–8.3)

## 2009-08-14 LAB — PROTEIN ELECTROPHORESIS, SERUM
Beta 2: 3.4 % (ref 3.2–6.5)
Beta Globulin: 5.5 % (ref 4.7–7.2)
Gamma Globulin: 14.3 % (ref 11.1–18.8)

## 2009-08-22 ENCOUNTER — Encounter: Admission: RE | Admit: 2009-08-22 | Discharge: 2009-08-22 | Payer: Self-pay | Admitting: Oncology

## 2009-09-09 LAB — CBC WITH DIFFERENTIAL/PLATELET
BASO%: 1 % (ref 0.0–2.0)
EOS%: 2 % (ref 0.0–7.0)
HCT: 31 % — ABNORMAL LOW (ref 34.8–46.6)
LYMPH%: 28.8 % (ref 14.0–49.7)
MCH: 33 pg (ref 25.1–34.0)
MCHC: 33.5 g/dL (ref 31.5–36.0)
MONO%: 8.6 % (ref 0.0–14.0)
NEUT%: 59.6 % (ref 38.4–76.8)
lymph#: 1.1 10*3/uL (ref 0.9–3.3)

## 2009-09-09 LAB — BASIC METABOLIC PANEL
BUN: 17 mg/dL (ref 6–23)
Potassium: 4.5 mEq/L (ref 3.5–5.3)
Sodium: 135 mEq/L (ref 135–145)

## 2009-09-11 ENCOUNTER — Ambulatory Visit: Payer: Self-pay | Admitting: Oncology

## 2009-09-11 LAB — IGG, IGA, IGM
IgA: 75 mg/dL (ref 68–378)
IgG (Immunoglobin G), Serum: 785 mg/dL (ref 694–1618)

## 2009-09-11 LAB — PROTEIN ELECTROPHORESIS, SERUM
Alpha-1-Globulin: 4.5 % (ref 2.9–4.9)
Beta 2: 3.2 % (ref 3.2–6.5)
Gamma Globulin: 15.4 % (ref 11.1–18.8)

## 2009-09-11 LAB — KAPPA/LAMBDA LIGHT CHAINS
Kappa free light chain: 1.81 mg/dL (ref 0.33–1.94)
Lambda Free Lght Chn: 2.97 mg/dL — ABNORMAL HIGH (ref 0.57–2.63)

## 2009-09-12 ENCOUNTER — Ambulatory Visit (HOSPITAL_COMMUNITY): Admission: RE | Admit: 2009-09-12 | Discharge: 2009-09-12 | Payer: Self-pay | Admitting: Oncology

## 2009-10-07 LAB — CBC WITH DIFFERENTIAL/PLATELET
BASO%: 1.8 % (ref 0.0–2.0)
EOS%: 5.7 % (ref 0.0–7.0)
MCH: 32.7 pg (ref 25.1–34.0)
MCV: 98.4 fL (ref 79.5–101.0)
MONO%: 12.9 % (ref 0.0–14.0)
RBC: 3.06 10*6/uL — ABNORMAL LOW (ref 3.70–5.45)
RDW: 15 % — ABNORMAL HIGH (ref 11.2–14.5)

## 2009-10-07 LAB — COMPREHENSIVE METABOLIC PANEL
AST: 23 U/L (ref 0–37)
BUN: 12 mg/dL (ref 6–23)
Calcium: 8.8 mg/dL (ref 8.4–10.5)
Chloride: 100 mEq/L (ref 96–112)
Creatinine, Ser: 0.69 mg/dL (ref 0.40–1.20)
Total Bilirubin: 0.8 mg/dL (ref 0.3–1.2)

## 2009-10-09 LAB — KAPPA/LAMBDA LIGHT CHAINS
Kappa free light chain: 1.71 mg/dL (ref 0.33–1.94)
Lambda Free Lght Chn: 2.6 mg/dL (ref 0.57–2.63)

## 2009-10-09 LAB — IGG, IGA, IGM
IgG (Immunoglobin G), Serum: 649 mg/dL — ABNORMAL LOW (ref 694–1618)
IgM, Serum: 96 mg/dL (ref 60–263)

## 2009-10-09 LAB — PROTEIN ELECTROPHORESIS, SERUM
Albumin ELP: 62 % (ref 55.8–66.1)
Alpha-2-Globulin: 12.9 % — ABNORMAL HIGH (ref 7.1–11.8)
M-Spike, %: 0.38 g/dL
Total Protein, Serum Electrophoresis: 5.6 g/dL — ABNORMAL LOW (ref 6.0–8.3)

## 2009-10-16 ENCOUNTER — Encounter (INDEPENDENT_AMBULATORY_CARE_PROVIDER_SITE_OTHER): Payer: Self-pay | Admitting: *Deleted

## 2009-11-01 ENCOUNTER — Ambulatory Visit: Payer: Self-pay | Admitting: Oncology

## 2009-11-04 LAB — CBC WITH DIFFERENTIAL/PLATELET
BASO%: 0.6 % (ref 0.0–2.0)
HCT: 30.8 % — ABNORMAL LOW (ref 34.8–46.6)
LYMPH%: 18.4 % (ref 14.0–49.7)
MCH: 33.3 pg (ref 25.1–34.0)
MCHC: 33.8 g/dL (ref 31.5–36.0)
MCV: 98.7 fL (ref 79.5–101.0)
MONO%: 16.2 % — ABNORMAL HIGH (ref 0.0–14.0)
NEUT%: 62.3 % (ref 38.4–76.8)
Platelets: 132 10*3/uL — ABNORMAL LOW (ref 145–400)
RBC: 3.12 10*6/uL — ABNORMAL LOW (ref 3.70–5.45)
nRBC: 0 % (ref 0–0)

## 2009-11-04 LAB — COMPREHENSIVE METABOLIC PANEL
ALT: 24 U/L (ref 0–35)
AST: 22 U/L (ref 0–37)
BUN: 14 mg/dL (ref 6–23)
Calcium: 8.3 mg/dL — ABNORMAL LOW (ref 8.4–10.5)
Creatinine, Ser: 0.71 mg/dL (ref 0.40–1.20)
Total Bilirubin: 0.7 mg/dL (ref 0.3–1.2)

## 2009-11-06 LAB — PROTEIN ELECTROPHORESIS, SERUM
Alpha-1-Globulin: 6.7 % — ABNORMAL HIGH (ref 2.9–4.9)
Alpha-2-Globulin: 12.4 % — ABNORMAL HIGH (ref 7.1–11.8)
Beta Globulin: 6.2 % (ref 4.7–7.2)
Gamma Globulin: 10.2 % — ABNORMAL LOW (ref 11.1–18.8)
M-Spike, %: 0.31 g/dL
Total Protein, Serum Electrophoresis: 5.8 g/dL — ABNORMAL LOW (ref 6.0–8.3)

## 2009-11-06 LAB — KAPPA/LAMBDA LIGHT CHAINS
Kappa free light chain: 1.34 mg/dL (ref 0.33–1.94)
Lambda Free Lght Chn: 1.37 mg/dL (ref 0.57–2.63)

## 2009-11-29 ENCOUNTER — Ambulatory Visit: Payer: Self-pay | Admitting: Oncology

## 2009-12-03 LAB — COMPREHENSIVE METABOLIC PANEL
ALT: 27 U/L (ref 0–35)
Albumin: 3.5 g/dL (ref 3.5–5.2)
CO2: 28 mEq/L (ref 19–32)
Calcium: 8.8 mg/dL (ref 8.4–10.5)
Chloride: 100 mEq/L (ref 96–112)
Glucose, Bld: 124 mg/dL — ABNORMAL HIGH (ref 70–99)
Potassium: 3.6 mEq/L (ref 3.5–5.3)
Sodium: 136 mEq/L (ref 135–145)
Total Bilirubin: 0.6 mg/dL (ref 0.3–1.2)
Total Protein: 6.4 g/dL (ref 6.0–8.3)

## 2009-12-03 LAB — CBC WITH DIFFERENTIAL/PLATELET
Basophils Absolute: 0 10*3/uL (ref 0.0–0.1)
Eosinophils Absolute: 0 10*3/uL (ref 0.0–0.5)
HGB: 11.2 g/dL — ABNORMAL LOW (ref 11.6–15.9)
MCV: 97.9 fL (ref 79.5–101.0)
MONO#: 0.1 10*3/uL (ref 0.1–0.9)
NEUT#: 2.4 10*3/uL (ref 1.5–6.5)
RBC: 3.35 10*6/uL — ABNORMAL LOW (ref 3.70–5.45)
RDW: 15.5 % — ABNORMAL HIGH (ref 11.2–14.5)
WBC: 2.7 10*3/uL — ABNORMAL LOW (ref 3.9–10.3)
nRBC: 0 % (ref 0–0)

## 2009-12-05 LAB — KAPPA/LAMBDA LIGHT CHAINS: Lambda Free Lght Chn: 1.9 mg/dL (ref 0.57–2.63)

## 2009-12-05 LAB — PROTEIN ELECTROPHORESIS, SERUM
Albumin ELP: 59.5 % (ref 55.8–66.1)
Beta Globulin: 6.9 % (ref 4.7–7.2)
M-Spike, %: 0.26 g/dL
Total Protein, Serum Electrophoresis: 6.2 g/dL (ref 6.0–8.3)

## 2009-12-18 ENCOUNTER — Ambulatory Visit: Payer: Self-pay | Admitting: Cardiovascular Disease

## 2010-01-01 ENCOUNTER — Ambulatory Visit: Payer: Self-pay | Admitting: Oncology

## 2010-01-03 LAB — CBC WITH DIFFERENTIAL/PLATELET
Eosinophils Absolute: 0.2 10*3/uL (ref 0.0–0.5)
HCT: 30.6 % — ABNORMAL LOW (ref 34.8–46.6)
LYMPH%: 19.4 % (ref 14.0–49.7)
MCHC: 33.3 g/dL (ref 31.5–36.0)
MONO#: 0.6 10*3/uL (ref 0.1–0.9)
NEUT#: 1.5 10*3/uL (ref 1.5–6.5)
NEUT%: 50.2 % (ref 38.4–76.8)
Platelets: 191 10*3/uL (ref 145–400)
WBC: 3 10*3/uL — ABNORMAL LOW (ref 3.9–10.3)

## 2010-01-03 LAB — COMPREHENSIVE METABOLIC PANEL
BUN: 18 mg/dL (ref 6–23)
CO2: 26 mEq/L (ref 19–32)
Calcium: 8.5 mg/dL (ref 8.4–10.5)
Chloride: 103 mEq/L (ref 96–112)
Creatinine, Ser: 0.88 mg/dL (ref 0.40–1.20)
Glucose, Bld: 87 mg/dL (ref 70–99)

## 2010-01-08 LAB — KAPPA/LAMBDA LIGHT CHAINS
Kappa free light chain: 1.2 mg/dL (ref 0.33–1.94)
Lambda Free Lght Chn: 1.74 mg/dL (ref 0.57–2.63)

## 2010-01-08 LAB — PROTEIN ELECTROPHORESIS, SERUM
Alpha-1-Globulin: 6.7 % — ABNORMAL HIGH (ref 2.9–4.9)
Alpha-2-Globulin: 13.9 % — ABNORMAL HIGH (ref 7.1–11.8)
Total Protein, Serum Electrophoresis: 6.2 g/dL (ref 6.0–8.3)

## 2010-01-31 ENCOUNTER — Ambulatory Visit: Payer: Self-pay | Admitting: Oncology

## 2010-01-31 LAB — CBC WITH DIFFERENTIAL/PLATELET
BASO%: 0.3 % (ref 0.0–2.0)
Eosinophils Absolute: 0 10*3/uL (ref 0.0–0.5)
MCHC: 33.4 g/dL (ref 31.5–36.0)
MCV: 98.5 fL (ref 79.5–101.0)
MONO#: 0.1 10*3/uL (ref 0.1–0.9)
MONO%: 1.7 % (ref 0.0–14.0)
NEUT#: 3.4 10*3/uL (ref 1.5–6.5)
RBC: 3.28 10*6/uL — ABNORMAL LOW (ref 3.70–5.45)
RDW: 15.8 % — ABNORMAL HIGH (ref 11.2–14.5)
WBC: 3.6 10*3/uL — ABNORMAL LOW (ref 3.9–10.3)
nRBC: 0 % (ref 0–0)

## 2010-01-31 LAB — COMPREHENSIVE METABOLIC PANEL
ALT: 21 U/L (ref 0–35)
AST: 30 U/L (ref 0–37)
Alkaline Phosphatase: 59 U/L (ref 39–117)
CO2: 25 mEq/L (ref 19–32)
Sodium: 137 mEq/L (ref 135–145)
Total Bilirubin: 0.7 mg/dL (ref 0.3–1.2)
Total Protein: 5.7 g/dL — ABNORMAL LOW (ref 6.0–8.3)

## 2010-01-31 LAB — MAGNESIUM: Magnesium: 1.8 mg/dL (ref 1.5–2.5)

## 2010-02-04 LAB — PROTEIN ELECTROPHORESIS, SERUM
Alpha-1-Globulin: 4.7 % (ref 2.9–4.9)
Alpha-2-Globulin: 12.2 % — ABNORMAL HIGH (ref 7.1–11.8)
Beta 2: 3.5 % (ref 3.2–6.5)
Beta Globulin: 6.3 % (ref 4.7–7.2)
Gamma Globulin: 9.3 % — ABNORMAL LOW (ref 11.1–18.8)
M-Spike, %: 0.2 g/dL

## 2010-02-28 LAB — CBC WITH DIFFERENTIAL/PLATELET
BASO%: 0.2 % (ref 0.0–2.0)
EOS%: 0.2 % (ref 0.0–7.0)
MCH: 32.2 pg (ref 25.1–34.0)
MCHC: 33 g/dL (ref 31.5–36.0)
MCV: 97.6 fL (ref 79.5–101.0)
MONO%: 2.7 % (ref 0.0–14.0)
RBC: 3.35 10*6/uL — ABNORMAL LOW (ref 3.70–5.45)
RDW: 15.3 % — ABNORMAL HIGH (ref 11.2–14.5)

## 2010-03-04 LAB — PROTEIN ELECTROPHORESIS, SERUM
Alpha-1-Globulin: 6 % — ABNORMAL HIGH (ref 2.9–4.9)
Beta 2: 3.3 % (ref 3.2–6.5)
Gamma Globulin: 9.3 % — ABNORMAL LOW (ref 11.1–18.8)

## 2010-03-04 LAB — COMPREHENSIVE METABOLIC PANEL
ALT: 20 U/L (ref 0–35)
AST: 23 U/L (ref 0–37)
Albumin: 3.7 g/dL (ref 3.5–5.2)
Alkaline Phosphatase: 65 U/L (ref 39–117)
BUN: 17 mg/dL (ref 6–23)
Potassium: 4.1 mEq/L (ref 3.5–5.3)
Sodium: 137 mEq/L (ref 135–145)

## 2010-03-06 ENCOUNTER — Ambulatory Visit: Payer: Self-pay | Admitting: Oncology

## 2010-03-28 LAB — CBC WITH DIFFERENTIAL/PLATELET
Basophils Absolute: 0 10*3/uL (ref 0.0–0.1)
EOS%: 7 % (ref 0.0–7.0)
Eosinophils Absolute: 0.2 10*3/uL (ref 0.0–0.5)
LYMPH%: 23.6 % (ref 14.0–49.7)
MCH: 32.7 pg (ref 25.1–34.0)
MCV: 98.1 fL (ref 79.5–101.0)
MONO%: 18.7 % — ABNORMAL HIGH (ref 0.0–14.0)
NEUT#: 1.4 10*3/uL — ABNORMAL LOW (ref 1.5–6.5)
Platelets: 158 10*3/uL (ref 145–400)
RBC: 3.18 10*6/uL — ABNORMAL LOW (ref 3.70–5.45)
RDW: 15.9 % — ABNORMAL HIGH (ref 11.2–14.5)

## 2010-03-28 LAB — BASIC METABOLIC PANEL
BUN: 12 mg/dL (ref 6–23)
Calcium: 8.6 mg/dL (ref 8.4–10.5)
Glucose, Bld: 89 mg/dL (ref 70–99)
Sodium: 138 mEq/L (ref 135–145)

## 2010-04-01 LAB — KAPPA/LAMBDA LIGHT CHAINS
Kappa free light chain: 1.29 mg/dL (ref 0.33–1.94)
Kappa:Lambda Ratio: 0.65 (ref 0.26–1.65)
Lambda Free Lght Chn: 1.97 mg/dL (ref 0.57–2.63)

## 2010-04-01 LAB — PROTEIN ELECTROPHORESIS, SERUM
Albumin ELP: 64.4 % (ref 55.8–66.1)
M-Spike, %: 0.18 g/dL
Total Protein, Serum Electrophoresis: 5.6 g/dL — ABNORMAL LOW (ref 6.0–8.3)

## 2010-04-23 ENCOUNTER — Ambulatory Visit: Payer: Self-pay | Admitting: Oncology

## 2010-04-25 LAB — CBC WITH DIFFERENTIAL/PLATELET
BASO%: 0.3 % (ref 0.0–2.0)
Basophils Absolute: 0 10*3/uL (ref 0.0–0.1)
Eosinophils Absolute: 0.1 10*3/uL (ref 0.0–0.5)
HGB: 10.5 g/dL — ABNORMAL LOW (ref 11.6–15.9)
LYMPH%: 21.9 % (ref 14.0–49.7)
MCV: 97.5 fL (ref 79.5–101.0)
MONO%: 20.3 % — ABNORMAL HIGH (ref 0.0–14.0)
NEUT#: 2.1 10*3/uL (ref 1.5–6.5)
NEUT%: 55.7 % (ref 38.4–76.8)
RBC: 3.26 10*6/uL — ABNORMAL LOW (ref 3.70–5.45)
RDW: 15.9 % — ABNORMAL HIGH (ref 11.2–14.5)
WBC: 3.8 10*3/uL — ABNORMAL LOW (ref 3.9–10.3)

## 2010-04-25 LAB — BASIC METABOLIC PANEL
CO2: 32 mEq/L (ref 19–32)
Chloride: 101 mEq/L (ref 96–112)
Glucose, Bld: 86 mg/dL (ref 70–99)
Potassium: 3.5 mEq/L (ref 3.5–5.3)
Sodium: 137 mEq/L (ref 135–145)

## 2010-04-29 LAB — PROTEIN ELECTROPHORESIS, SERUM
Albumin ELP: 63.7 % (ref 55.8–66.1)
Beta 2: 3.1 % — ABNORMAL LOW (ref 3.2–6.5)
Beta Globulin: 6.5 % (ref 4.7–7.2)
M-Spike, %: 0.19 g/dL

## 2010-04-29 LAB — KAPPA/LAMBDA LIGHT CHAINS
Kappa free light chain: 1.23 mg/dL (ref 0.33–1.94)
Kappa:Lambda Ratio: 0.55 (ref 0.26–1.65)

## 2010-05-23 ENCOUNTER — Ambulatory Visit: Payer: Self-pay | Admitting: Oncology

## 2010-05-23 LAB — CBC WITH DIFFERENTIAL/PLATELET
Basophils Absolute: 0 10*3/uL (ref 0.0–0.1)
EOS%: 1.8 % (ref 0.0–7.0)
HCT: 32.3 % — ABNORMAL LOW (ref 34.8–46.6)
HGB: 10.9 g/dL — ABNORMAL LOW (ref 11.6–15.9)
MCH: 32.8 pg (ref 25.1–34.0)
MONO#: 0.6 10*3/uL (ref 0.1–0.9)
NEUT#: 2.5 10*3/uL (ref 1.5–6.5)
NEUT%: 63.3 % (ref 38.4–76.8)
RDW: 15.5 % — ABNORMAL HIGH (ref 11.2–14.5)
WBC: 4 10*3/uL (ref 3.9–10.3)
lymph#: 0.8 10*3/uL — ABNORMAL LOW (ref 0.9–3.3)

## 2010-05-23 LAB — BASIC METABOLIC PANEL
BUN: 18 mg/dL (ref 6–23)
Chloride: 102 mEq/L (ref 96–112)
Potassium: 4.1 mEq/L (ref 3.5–5.3)
Sodium: 137 mEq/L (ref 135–145)

## 2010-05-28 LAB — PROTEIN ELECTROPHORESIS, SERUM
Albumin ELP: 64.5 % (ref 55.8–66.1)
Alpha-1-Globulin: 4.7 % (ref 2.9–4.9)
Alpha-2-Globulin: 11.4 % (ref 7.1–11.8)
Beta 2: 3.5 % (ref 3.2–6.5)
Beta Globulin: 6.1 % (ref 4.7–7.2)
Total Protein, Serum Electrophoresis: 5.7 g/dL — ABNORMAL LOW (ref 6.0–8.3)

## 2010-05-28 LAB — KAPPA/LAMBDA LIGHT CHAINS
Kappa:Lambda Ratio: 0.61 (ref 0.26–1.65)
Lambda Free Lght Chn: 1.52 mg/dL (ref 0.57–2.63)

## 2010-06-20 LAB — CBC WITH DIFFERENTIAL/PLATELET
BASO%: 0.6 % (ref 0.0–2.0)
EOS%: 2.4 % (ref 0.0–7.0)
HCT: 32.4 % — ABNORMAL LOW (ref 34.8–46.6)
MCH: 33.5 pg (ref 25.1–34.0)
MCHC: 34.1 g/dL (ref 31.5–36.0)
MCV: 98.4 fL (ref 79.5–101.0)
MONO%: 18.9 % — ABNORMAL HIGH (ref 0.0–14.0)
NEUT%: 54.5 % (ref 38.4–76.8)
lymph#: 0.6 10*3/uL — ABNORMAL LOW (ref 0.9–3.3)

## 2010-06-20 LAB — BASIC METABOLIC PANEL
BUN: 18 mg/dL (ref 6–23)
Calcium: 9.1 mg/dL (ref 8.4–10.5)
Chloride: 104 mEq/L (ref 96–112)
Creatinine, Ser: 0.77 mg/dL (ref 0.40–1.20)

## 2010-06-25 ENCOUNTER — Ambulatory Visit: Payer: Self-pay | Admitting: Cardiovascular Disease

## 2010-06-25 ENCOUNTER — Ambulatory Visit (HOSPITAL_COMMUNITY)
Admission: RE | Admit: 2010-06-25 | Discharge: 2010-06-25 | Payer: Self-pay | Source: Home / Self Care | Attending: Cardiovascular Disease | Admitting: Cardiovascular Disease

## 2010-06-25 ENCOUNTER — Encounter: Payer: Self-pay | Admitting: Cardiovascular Disease

## 2010-06-25 ENCOUNTER — Ambulatory Visit: Payer: Self-pay

## 2010-06-25 DIAGNOSIS — G608 Other hereditary and idiopathic neuropathies: Secondary | ICD-10-CM | POA: Insufficient documentation

## 2010-07-04 ENCOUNTER — Ambulatory Visit
Admission: RE | Admit: 2010-07-04 | Discharge: 2010-07-04 | Payer: Self-pay | Source: Home / Self Care | Attending: Vascular Surgery | Admitting: Vascular Surgery

## 2010-07-16 ENCOUNTER — Ambulatory Visit: Payer: Self-pay | Admitting: Oncology

## 2010-07-18 LAB — BASIC METABOLIC PANEL
BUN: 17 mg/dL (ref 6–23)
CO2: 27 mEq/L (ref 19–32)
Calcium: 8.8 mg/dL (ref 8.4–10.5)
Chloride: 103 mEq/L (ref 96–112)
Creatinine, Ser: 0.72 mg/dL (ref 0.40–1.20)
Glucose, Bld: 93 mg/dL (ref 70–99)
Potassium: 5.5 mEq/L — ABNORMAL HIGH (ref 3.5–5.3)
Sodium: 137 mEq/L (ref 135–145)

## 2010-07-18 LAB — CBC WITH DIFFERENTIAL/PLATELET
BASO%: 1.3 % (ref 0.0–2.0)
Basophils Absolute: 0 10*3/uL (ref 0.0–0.1)
EOS%: 1.6 % (ref 0.0–7.0)
Eosinophils Absolute: 0.1 10*3/uL (ref 0.0–0.5)
HCT: 32.3 % — ABNORMAL LOW (ref 34.8–46.6)
HGB: 11.1 g/dL — ABNORMAL LOW (ref 11.6–15.9)
LYMPH%: 19.1 % (ref 14.0–49.7)
MCH: 33.7 pg (ref 25.1–34.0)
MCHC: 34.5 g/dL (ref 31.5–36.0)
MCV: 97.9 fL (ref 79.5–101.0)
MONO#: 0.5 10*3/uL (ref 0.1–0.9)
MONO%: 13.1 % (ref 0.0–14.0)
NEUT#: 2.4 10*3/uL (ref 1.5–6.5)
NEUT%: 64.9 % (ref 38.4–76.8)
Platelets: 143 10*3/uL — ABNORMAL LOW (ref 145–400)
RBC: 3.3 10*6/uL — ABNORMAL LOW (ref 3.70–5.45)
RDW: 15.9 % — ABNORMAL HIGH (ref 11.2–14.5)
WBC: 3.7 10*3/uL — ABNORMAL LOW (ref 3.9–10.3)
lymph#: 0.7 10*3/uL — ABNORMAL LOW (ref 0.9–3.3)

## 2010-07-26 ENCOUNTER — Other Ambulatory Visit: Payer: Self-pay | Admitting: Oncology

## 2010-07-26 ENCOUNTER — Encounter: Payer: Self-pay | Admitting: Obstetrics and Gynecology

## 2010-07-26 DIAGNOSIS — Z9889 Other specified postprocedural states: Secondary | ICD-10-CM

## 2010-07-28 ENCOUNTER — Encounter: Payer: Self-pay | Admitting: Oncology

## 2010-08-07 ENCOUNTER — Other Ambulatory Visit: Payer: Self-pay | Admitting: Oncology

## 2010-08-07 DIAGNOSIS — Z9889 Other specified postprocedural states: Secondary | ICD-10-CM

## 2010-08-07 NOTE — Letter (Signed)
Summary: Appointment - Reminder 2  [employer], Main Office  [address]   [place], [place] [address]   Phone: [phone]  Fax: [phone]     October 16, 2009 MRN: [id number]   [facility] [address] [place], [place]  [address]   Dear Ms. Gillum,  Our records indicate that it is time to schedule a follow-up appointment with Dr. Eden Emms. It is very important that we reach you to schedule this appointment. We look forward to participating in your health care needs. Please contact us at the number listed above at your earliest convenience to schedule your appointment.  If you are unable to make an appointment at this time, give [place] a call so we can update our records.     Sincerely,  [facility] D[facility] Scheduling Team

## 2010-08-07 NOTE — Assessment & Plan Note (Signed)
Summary: 6 months/sl/need echo sameday per nishan/sl   Primary Natasha Jackson:  Dr. Jarold Motto  CC:  pt stopped taken diovan due to fatigue.  History of Present Illness: Natasha Jackson is seen today for F/U of her AR and HTN.  She has been through a lot lately.  She had her therapy for breast cancer including XRT.  unfortunately she has now been diagnosed with multiple myeloma.  This was worked up because of anemia.  As confirmed by bone marrow biopsy.  She apparently has bone lesions on her forehead and right leg.  His been seen for second opinion at [facility].  Her M spike is declining and her disease seems to be stabilized for now..  Her cardiac standpoint her hypertension is well controlled.  She has moderate aortic insufficiency.  I don't think the XRT would've involved her aortic valve as they are able to target radiation therapy very well these days..  She has not had significant LV cavity dilatation or dysfunction in the past.  She has not had any exertional dyspnea PND or orthopnea no palpitations or syncope.  Her functional status is still excellent. I reviewed her echo from today and she has mild LVE, moderate AR with trileaflet valve and moderate MR which is a little worse than I remember her having  Current Problems (verified): 1)  Left Bundle Branch Block  (ICD-426.3) 2)  Multiple Myeloma  (ICD-203.00) 3)  Essential Hypertension, Benign  (ICD-401.1) 4)  Arthritis  (ICD-716.90) 5)  Dermatitis  (ICD-692.9) 6)  Postmenopausal Status  (ICD-V49.81) 7)  Aortic Insufficiency  (ICD-424.1)  Current Medications (verified): 1)  Multivitamins   Tabs (Multiple Vitamin) .[name].. 1 Tab By Mouth Once Daily 2)  Revlimid  (Lenalidomide) .[name].. 1 Tab By Mouth Once Daily For 21 Days Then Wait 8 Days Then Start Again 3)  Aspirin Ec 325 Mg Tbec (Aspirin) .... Take One Tablet By Mouth Daily 4)  Levothyroxine Sodium 25 Mcg Tabs (Levothyroxine Sodium) .[name].. 1 Tab By Mouth Once Daily 5)  Diovan 160 Mg Tabs (Valsartan) .... Has  Not Taken For A Couple Months Take One Tablet By Mouth Daily  Allergies (verified): No Known Drug Allergies  Past History:  Past Medical History: Last updated: 01/04/2009 Breast Cancer Multiple Myeloma Aortic Regurgitattion Hypertension  Past Surgical History: Last updated: 01/03/2009  Excision of lymph node, left groin.  Repair of incisional hernia at the umbilicus with mesh.  Colonoscopy.  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   Vital Signs:  Patient profile:   77 year old female Height:      65 inches Weight:      128 pounds BMI:     21.38 Pulse rate:   66 / minute Resp:     12 per minute BP sitting:   134 / 54  (left arm)  Vitals Entered By: Kem Parkinson (June 25, 2010 1:55 PM)  Physical Exam  General:  Affect appropriate Healthy:  appears stated age HEENT: normal Neck supple with no adenopathy JVP normal no bruits no thyromegaly Lungs clear with no wheezing and good diaphragmatic motion Heart:  S1/S2 AR murmur  murmur,rub, gallop or click  PMI normal Abdomen: benighn, BS positve, no tenderness, no AAA no bruit.  No HSM or HJR Distal pulses intact with no bruits No edema Neuro non-focal Skin warm and dry    Impression & Recommendations:  Problem # 1:  ESSENTIAL HYPERTENSION, BENIGN (ICD-401.1) Well contolled  Her updated medication list for this problem includes:    Aspirin Ec 325 Mg Tbec (Aspirin) .[name]... Take one tablet by mouth daily    Diovan 160 Mg Tabs (Valsartan)  ..... Has not taken for a couple months take one tablet by mouth daily  Orders: EKG w/ Interpretation (93000)  Problem # 2:  MULTIPLE  MYELOMA (ICD-203.00) F/U Dr Stephani Police bone survey stable and on maint Rx.  F.U lab work oncology  Problem # 3:  AORTIC INSUFFICIENCY (ICD-424.1) Moderate with compensated ventricle no indication for surgery.  Prognosis limited by myeloma and age Her updated medication list for this problem includes:    Diovan 160 Mg Tabs (Valsartan) ..... Has not taken for a couple months take one tablet by mouth daily  Problem # 4:  OTHER SPECIFIED IDIOPATHIC PERIPHERAL NEUROPATHY (ICD-356.8) Complains of tingling in feet.  Pulses are fine  F/U primary consider trial of neurontin or gabipentin  Patient Instructions: 1)  Your physician recommends that you schedule a follow-up appointment in: 6 months with Dr. Eden Emms 2)  Your physician recommends that you continue on your current medications as directed. Please refer to the Current Medication list given to you today.

## 2010-08-07 NOTE — Progress Notes (Signed)
Summary: new medication - pennsaid   Phone Note Call from Patient Call back at Home Phone [phone]   Caller: Patient Reason for Call: Talk to Nurse Details for Reason: pt went to pcp on 1/20 gave pt pennsaid -  cream . pt read side effect, regarding heart problem. would like an opinion on  should she use this or not.  Initial call taken by: Lorne Skeens,  July 26, 2009 4:17 PM  Follow-up for Phone Call        spoke with pt, she has only used the cream once and was concerned about the side effects. will foward to dr Eden Emms for his review Deliah Goody, RN  July 26, 2009 4:31 PM   Additional Follow-up for Phone Call Additional follow up Details #1::        No history of CAD.  Should be ok to take it especially topically Additional Follow-up by: Colon Branch, MD, [facility],  July 28, 2009 9:31 PM     Appended Document: new medication - pennsaid  pt aware

## 2010-08-07 NOTE — Assessment & Plan Note (Signed)
Summary: f27m/rsc per pt call from NOS on 6/2/lg   Primary Provider:  Dr. Jarold Motto  CC:  Check up.  History of Present Illness: Natasha Jackson is seen today for F/U of her AR and HTN.  She has been through a lot lately.  She had her therapy for breast cancer including XRT.  unfortunately she has now been diagnosed with multiple myeloma.  This was worked up because of anemia.  As confirmed by bone marrow biopsy.  She apparently has bone lesions on her forehead and right leg.  His been seen for second opinion at [facility].  Her M spike is declining and her disease seems to be stabilized for now..  Her cardiac standpoint her hypertension is well controlled.  She has moderate aortic insufficiency.  I don't think the XRT would've involved her aortic valve as they are able to target radiation therapy very well these days..  She has not had significant LV cavity dilatation or dysfunction in the past.  She has not had any exertional dyspnea PND or orthopnea no palpitations or syncope.  Her functional status is still excellent.  I reviewed her echo from 06/04/2009 and her AR continues to be moderate with a compensated ventricle.  She will have a F/U echo in 6 months  Current Problems (verified): 1)  Left Bundle Branch Block  (ICD-426.3) 2)  Multiple Myeloma  (ICD-203.00) 3)  Essential Hypertension, Benign  (ICD-401.1) 4)  Arthritis  (ICD-716.90) 5)  Dermatitis  (ICD-692.9) 6)  Postmenopausal Status  (ICD-V49.81) 7)  Aortic Insufficiency  (ICD-424.1)  Current Medications (verified): 1)  Multivitamins   Tabs (Multiple Vitamin) .[name].. 1 Tab By Mouth Once Daily 2)  Prednisone 20 Mg Tabs (Prednisone) .... 1/2 Tab   Once Weekly 3)  Revlimid  (Lenalidomide) .[name].. 1 Tab By Mouth Once Daily For 21 Days Then Wait 8 Days Then Start Again 4)  Aspirin Ec 325 Mg Tbec (Aspirin) .... Take One Tablet By Mouth Daily 5)  Levothyroxine Sodium 25 Mcg Tabs (Levothyroxine Sodium) .[name].. 1 Tab By Mouth Once Daily 6)  Diovan 160 Mg Tabs  (Valsartan) .... Take One Tablet By Mouth Daily  Allergies (verified): No Known Drug Allergies  Past History:  Past Medical History: Last updated: 01/04/2009 Breast Cancer Multiple Myeloma Aortic Regurgitattion Hypertension  Past Surgical History: Last updated: 01/03/2009  Excision of lymph node, left groin.  Repair of incisional hernia at the umbilicus with mesh.  Colonoscopy.  Family History: Last updated: 01/04/2009 non-contributory  Social History: Last updated: 01/04/2009 Non-smoker Non-drinker Active  Review of Systems       Denies fever, malais, weight loss, blurry vision, decreased visual acuity, cough, sputum, SOB, hemoptysis, pleuritic pain, palpitaitons, heartburn, abdominal pain, melena, lower extremity edema, claudication, or rash.   Vital Signs:  Patient profile:   77 year old female Height:      65 inches Weight:      128 pounds BMI:     21.38 Pulse rate:   59 / minute BP sitting:   130 / 57  (left arm)  Vitals Entered By: Kem Parkinson (December 18, 2009 11:23 AM)  Physical Exam  General:  Affect appropriate Healthy:  appears stated age HEENT: normal Neck supple with no adenopathy JVP normal no bruits no thyromegaly Lungs clear with no wheezing and good diaphragmatic motion no rub, gallop or click PMI normal Abdomen: benighn, BS positve, no tenderness, no AAA no bruit.  No HSM or HJR Distal pulses intact with no bruits No edema Neuro non-focal Skin  warm and dry    Impression & Recommendations:  Problem # 1:  ESSENTIAL HYPERTENSION, BENIGN (ICD-401.1)  Well controlled Her updated medication list for this problem includes:    Aspirin Ec 325 Mg Tbec (Aspirin) .[name]... Take one tablet by mouth daily    Diovan 160 Mg Tabs (Valsartan) .[name]... Take one tablet by mouth daily  Her updated medication list for this problem includes:    Aspirin Ec 325 Mg Tbec (Aspirin) .[name]... Take one tablet by mouth daily    Diovan 160 Mg Tabs (Valsartan) .[name]...  Take one tablet by mouth daily  Problem # 2:  LEFT BUNDLE BRANCH BLOCK (ICD-426.3) Chronic no change from 11/10  no evidence of high grade heart block Her updated medication list for this problem includes:    Aspirin Ec 325 Mg Tbec (Aspirin) .[name]... Take one tablet by mouth daily  Problem # 3:  AORTIC INSUFFICIENCY (ICD-424.1) Pulse pressure not wide, and no change in murmur. F/U echo in 6 months Continue afterload reduction with ARB Her updated medication list for this problem includes:    Diovan 160 Mg Tabs (Valsartan) .[name]... Take one tablet by mouth daily  Orders: Echocardiogram (Echo)  Problem # 4:  MULTIPLE  MYELOMA (ICD-203.00) Continue F[facility]U at [facility].  Will likely get repeat bone survey and surveilance SPEP and UPEP Follow for worsening anema  Patient Instructions: 1)  Your physician recommends that you schedule a follow-up appointment in: 6 months with dr Eden Emms echo same day 2)  Your physician recommends that you continue on your current medications as directed. Please refer to the Current Medication list given to you today. 3)  Your physician has requested that you have an echocardiogram.  Echocardiography is a painless test that uses sound waves to create images of your heart. It provides your doctor with information about the size and shape of your heart and how well your heart's chambers and valves are working.  This procedure takes approximately one hour. There are no restrictions for this procedure.   EKG Report  Procedure date:  06/05/2009  Findings:      NSR 52 LAD LBBB

## 2010-08-15 ENCOUNTER — Other Ambulatory Visit: Payer: Self-pay | Admitting: Oncology

## 2010-08-15 ENCOUNTER — Encounter (HOSPITAL_BASED_OUTPATIENT_CLINIC_OR_DEPARTMENT_OTHER): Payer: Medicare Other | Admitting: Oncology

## 2010-08-15 DIAGNOSIS — C9000 Multiple myeloma not having achieved remission: Secondary | ICD-10-CM

## 2010-08-15 DIAGNOSIS — D0590 Unspecified type of carcinoma in situ of unspecified breast: Secondary | ICD-10-CM

## 2010-08-15 LAB — COMPREHENSIVE METABOLIC PANEL
AST: 29 U/L (ref 0–37)
BUN: 18 mg/dL (ref 6–23)
CO2: 30 mEq/L (ref 19–32)
Calcium: 9.1 mg/dL (ref 8.4–10.5)
Chloride: 102 mEq/L (ref 96–112)
Glucose, Bld: 93 mg/dL (ref 70–99)
Potassium: 3.9 mEq/L (ref 3.5–5.3)
Sodium: 139 mEq/L (ref 135–145)
Total Bilirubin: 0.7 mg/dL (ref 0.3–1.2)

## 2010-08-15 LAB — CBC WITH DIFFERENTIAL/PLATELET
HCT: 32.5 % — ABNORMAL LOW (ref 34.8–46.6)
HGB: 11 g/dL — ABNORMAL LOW (ref 11.6–15.9)
LYMPH%: 21.8 % (ref 14.0–49.7)
MCV: 97.6 fL (ref 79.5–101.0)
NEUT#: 2.1 10*3/uL (ref 1.5–6.5)
RBC: 3.34 10*6/uL — ABNORMAL LOW (ref 3.70–5.45)
lymph#: 0.7 10*3/uL — ABNORMAL LOW (ref 0.9–3.3)

## 2010-08-19 LAB — PROTEIN ELECTROPHORESIS, SERUM
Alpha-1-Globulin: 4.6 % (ref 2.9–4.9)
Alpha-2-Globulin: 11.7 % (ref 7.1–11.8)
Beta 2: 3.7 % (ref 3.2–6.5)
Gamma Globulin: 11.9 % (ref 11.1–18.8)
Total Protein, Serum Electrophoresis: 6.1 g/dL (ref 6.0–8.3)

## 2010-08-19 LAB — KAPPA/LAMBDA LIGHT CHAINS: Kappa:Lambda Ratio: 1.24 (ref 0.26–1.65)

## 2010-08-22 ENCOUNTER — Encounter (HOSPITAL_BASED_OUTPATIENT_CLINIC_OR_DEPARTMENT_OTHER): Payer: Medicare Other | Admitting: Oncology

## 2010-08-22 DIAGNOSIS — D0590 Unspecified type of carcinoma in situ of unspecified breast: Secondary | ICD-10-CM

## 2010-08-22 DIAGNOSIS — C9000 Multiple myeloma not having achieved remission: Secondary | ICD-10-CM

## 2010-08-25 ENCOUNTER — Ambulatory Visit
Admission: RE | Admit: 2010-08-25 | Discharge: 2010-08-25 | Disposition: A | Discharge status: Home / Self Care | Payer: Medicare Other | Source: Ambulatory Visit | Attending: Oncology | Admitting: Oncology

## 2010-08-25 DIAGNOSIS — Z9889 Other specified postprocedural states: Secondary | ICD-10-CM

## 2010-08-25 MED ORDER — GADOBENATE DIMEGLUMINE 529 MG/ML IV SOLN
12.0000 mL | Freq: Once | INTRAVENOUS | Status: AC | PRN
  Administered 2010-08-25: 12 mL via INTRAVENOUS

## 2010-09-12 ENCOUNTER — Other Ambulatory Visit: Payer: Self-pay | Admitting: Oncology

## 2010-09-12 ENCOUNTER — Encounter (HOSPITAL_BASED_OUTPATIENT_CLINIC_OR_DEPARTMENT_OTHER): Payer: Medicare Other | Admitting: Oncology

## 2010-09-12 DIAGNOSIS — D0590 Unspecified type of carcinoma in situ of unspecified breast: Secondary | ICD-10-CM

## 2010-09-12 DIAGNOSIS — C9000 Multiple myeloma not having achieved remission: Secondary | ICD-10-CM

## 2010-09-12 DIAGNOSIS — Z23 Encounter for immunization: Secondary | ICD-10-CM

## 2010-09-12 LAB — BASIC METABOLIC PANEL
BUN: 17 mg/dL (ref 6–23)
Chloride: 102 mEq/L (ref 96–112)
Creatinine, Ser: 0.88 mg/dL (ref 0.40–1.20)
Glucose, Bld: 93 mg/dL (ref 70–99)
Potassium: 4.2 mEq/L (ref 3.5–5.3)

## 2010-09-12 LAB — CBC WITH DIFFERENTIAL/PLATELET
Basophils Absolute: 0.1 10*3/uL (ref 0.0–0.1)
Eosinophils Absolute: 0.1 10*3/uL (ref 0.0–0.5)
HCT: 31.2 % — ABNORMAL LOW (ref 34.8–46.6)
HGB: 10.5 g/dL — ABNORMAL LOW (ref 11.6–15.9)
MCH: 32.1 pg (ref 25.1–34.0)
MCV: 95.4 fL (ref 79.5–101.0)
MONO%: 20.8 % — ABNORMAL HIGH (ref 0.0–14.0)
NEUT#: 1.6 10*3/uL (ref 1.5–6.5)
NEUT%: 50.9 % (ref 38.4–76.8)
RDW: 15.8 % — ABNORMAL HIGH (ref 11.2–14.5)
lymph#: 0.7 10*3/uL — ABNORMAL LOW (ref 0.9–3.3)

## 2010-10-10 ENCOUNTER — Other Ambulatory Visit: Payer: Self-pay | Admitting: Oncology

## 2010-10-10 ENCOUNTER — Encounter (HOSPITAL_BASED_OUTPATIENT_CLINIC_OR_DEPARTMENT_OTHER): Payer: Medicare Other | Admitting: Oncology

## 2010-10-10 DIAGNOSIS — C9000 Multiple myeloma not having achieved remission: Secondary | ICD-10-CM

## 2010-10-10 DIAGNOSIS — Z23 Encounter for immunization: Secondary | ICD-10-CM

## 2010-10-10 DIAGNOSIS — D0590 Unspecified type of carcinoma in situ of unspecified breast: Secondary | ICD-10-CM

## 2010-10-10 LAB — CBC WITH DIFFERENTIAL/PLATELET
BASO%: 1.7 % (ref 0.0–2.0)
EOS%: 1.4 % (ref 0.0–7.0)
LYMPH%: 20.8 % (ref 14.0–49.7)
MCH: 32.1 pg (ref 25.1–34.0)
MCHC: 33.3 g/dL (ref 31.5–36.0)
MCV: 96.3 fL (ref 79.5–101.0)
MONO%: 13.8 % (ref 0.0–14.0)
Platelets: 155 10*3/uL (ref 145–400)
RBC: 3.49 10*6/uL — ABNORMAL LOW (ref 3.70–5.45)
RDW: 15.9 % — ABNORMAL HIGH (ref 11.2–14.5)
nRBC: 0 % (ref 0–0)

## 2010-10-10 LAB — BASIC METABOLIC PANEL
BUN: 15 mg/dL (ref 6–23)
CO2: 27 mEq/L (ref 19–32)
Chloride: 104 mEq/L (ref 96–112)
Creatinine, Ser: 0.77 mg/dL (ref 0.40–1.20)
Glucose, Bld: 88 mg/dL (ref 70–99)

## 2010-11-07 ENCOUNTER — Encounter (HOSPITAL_BASED_OUTPATIENT_CLINIC_OR_DEPARTMENT_OTHER): Payer: Medicare Other | Admitting: Oncology

## 2010-11-07 ENCOUNTER — Other Ambulatory Visit: Payer: Self-pay | Admitting: Oncology

## 2010-11-07 DIAGNOSIS — D0590 Unspecified type of carcinoma in situ of unspecified breast: Secondary | ICD-10-CM

## 2010-11-07 DIAGNOSIS — Z23 Encounter for immunization: Secondary | ICD-10-CM

## 2010-11-07 DIAGNOSIS — C9000 Multiple myeloma not having achieved remission: Secondary | ICD-10-CM

## 2010-11-07 LAB — CBC WITH DIFFERENTIAL/PLATELET
Basophils Absolute: 0.1 10*3/uL (ref 0.0–0.1)
EOS%: 1.1 % (ref 0.0–7.0)
Eosinophils Absolute: 0.1 10*3/uL (ref 0.0–0.5)
HCT: 31.8 % — ABNORMAL LOW (ref 34.8–46.6)
HGB: 10.7 g/dL — ABNORMAL LOW (ref 11.6–15.9)
MCH: 32.1 pg (ref 25.1–34.0)
MCV: 95.5 fL (ref 79.5–101.0)
MONO%: 11.1 % (ref 0.0–14.0)
NEUT%: 67.9 % (ref 38.4–76.8)

## 2010-11-07 LAB — COMPREHENSIVE METABOLIC PANEL
Albumin: 3.6 g/dL (ref 3.5–5.2)
Alkaline Phosphatase: 75 U/L (ref 39–117)
BUN: 16 mg/dL (ref 6–23)
CO2: 27 mEq/L (ref 19–32)
Glucose, Bld: 93 mg/dL (ref 70–99)
Potassium: 3.6 mEq/L (ref 3.5–5.3)
Total Bilirubin: 0.3 mg/dL (ref 0.3–1.2)

## 2010-11-11 LAB — KAPPA/LAMBDA LIGHT CHAINS
Kappa free light chain: 1.28 mg/dL (ref 0.33–1.94)
Lambda Free Lght Chn: 2.81 mg/dL — ABNORMAL HIGH (ref 0.57–2.63)

## 2010-11-11 LAB — PROTEIN ELECTROPHORESIS, SERUM
Alpha-2-Globulin: 11 % (ref 7.1–11.8)
Beta Globulin: 6.1 % (ref 4.7–7.2)
Gamma Globulin: 11.9 % (ref 11.1–18.8)
Total Protein, Serum Electrophoresis: 6.1 g/dL (ref 6.0–8.3)

## 2010-11-14 ENCOUNTER — Encounter: Payer: Medicare Other | Admitting: Oncology

## 2010-11-18 NOTE — Assessment & Plan Note (Signed)
[facility]                            CARDIOLOGY OFFICE NOTE   Natasha Jackson                         MRN:          [id number]  DATE:06/05/2008                            DOB:          04-23-1926    HISTORY OF PRESENT ILLNESS:  Natasha Jackson returns today for followup.  When I  last saw her, she had some medical concerns.  She had recurrent lesions  on her forehead with a history of squamous cell carcinoma.  She saw Dr.  Donzetta Starch and had some topical therapy for it.  She subsequently was  diagnosed with breast cancer, this involved the right breast.  She had a  lumpectomy type surgery and then had adjuvant radiation.  Fortunately  most of the radiation port was on the right side.  She has moderate  aortic insufficiency and I talked to Dr. Dayton Scrape about trying not to  radiate the left side in regards to accelerating any aortic valve  degeneration.   She has been doing well.  She seems little less depressed.  She is not  thrilled about being on tamoxifen.  I tried to reassure her about this.  She was on estrogen replacement and this has had to be stopped.  She has  debilitating hot flashes.  She was placed on Effexor to try to mitigate  this.  I told her to look for some natural remedies as well.   Otherwise, she is doing well.  She is not having significant  palpitations, PND, or orthopnea.  There is no dyspnea.   There is no chest pain.  She has a long standing history of an IVCD that  is likely related to her aortic insufficiency and no documented coronary  artery disease.  Her last Myoview in Nov 17, 2007 was nonischemic with  an EF of 65%.   MEDICATIONS:  Her meds include  1. Multivitamin.  2. Thiamine 160 a day.  3. Aspirin a day.  4. Meloxicam.  5. Levothyroxine 3 times a week.  6. Effexor 37.5.  7. Tamoxifen tablets once a day.   PHYSICAL EXAMINATION:  GENERAL:  Remarkable for healthy-appearing middle-  aged white female, in no distress.   She does have some papular type  lesions on her forehead.  VITAL SIGNS:  Weight is 134, blood pressure 112/60, pulse 67 and  regular, respiratory rate 14, afebrile.  HEENT:  Unremarkable.  NECK:  Carotids are normal without bruit.  No lymphadenopathy,  thyromegaly, or JVP elevation.  LUNGS:  Clear.  Good diaphragmatic motion.  No wheezing.  S1 and S2 with  an aortic insufficiency murmur.  PMI normal.  ABDOMEN:  Benign.  Bowel  sounds positive.  No AAA, no tenderness, no bruit, no  hepatosplenomegaly, no hepatojugular reflux, and no tenderness.  EXTREMITIES:  Distal pulses are intact.  No edema.  NEUROLOGIC:  Nonfocal.  SKIN:  Warm and dry.  No edema status post right lumpectomy in the right  lower quadrant.   IMPRESSION:  1. Aortic insufficiency, moderate.  Followup 2-D echocardiogram,      particularly in the setting  of recent radiation.  Continue      afterload reduction with Diovan.  2. Postmenopausal with significant hot flashes.  Estrogen stopped due      to hormone positive breast cancer.  Continue Effexor.  3. Dermatitis.  Followup with Dr. Donzetta Starch, currently stable.      Continue to apply topical solution to the forehead.  Overall, I      think she is doing well.  I will see her back in 6 months.     Natasha Pick. Eden Emms, MD, [facility]  Electronically Signed    PCN/MedQ  DD: 06/05/2008  DT: 06/06/2008  Job #: [id number]

## 2010-11-18 NOTE — Assessment & Plan Note (Signed)
[facility]                            CARDIOLOGY OFFICE NOTE   Natasha Jackson, Natasha Jackson                         MRN:          [id number]  DATE:06/10/2007                            DOB:          [birth year]    Natasha Jackson returns today for a followup.  She had multiple issues to discuss  today.   I follow her for aortic insufficiency.  Her last echo was May 25, 2006.  She needs to have a followup echo this year.  Her EF was 55%.  Her left ventricular end diastolic dimension was 47 and systolic  dimension was 28.  She had moderate aortic insufficiency.   She has been doing well.  She takes good care of her teeth and sees a  dentist every 6 months.  There has been no fever.  No evidence of PND or  orthopnea.  No congestive heart failure, palpitations, or syncope.   The patient has had a stress Myoview in 2006, but none since then.  She  is not getting any chest pain.  She has been compliant with her meds.  She has hypertension and takes Diovan for afterload reduction and to  treat her hypertension.   REVIEW OF SYSTEMS:  Remarkable for multiple issues.  She has recently  been diagnosed with hypothyroidism and is on replacement.  She had  multiple questions about side effects of this medicine.  I tried to  reassure her that, if anything, it should help give her more energy and  boost her metabolism.  She has significant arthritis in her hands and is  on meloxicam.  I told her this would be fine.  More concerning, is she  has a history of melanoma and squamous cell carcinoma.  The melanoma was  on the left thigh, the squamous cell carcinoma was on the right leg.  She has had a fairly progressive rash on her forehead in the last 2 to 3  months.  She has tried some Benadryl cream because it is itchy.  However, I think it needs to be seen by Dr. Donzetta Starch, her  dermatologist, sooner rather than later.  Review of systems otherwise  negative.   CURRENT  MEDICATIONS:  1. Premarin 0.625 a day.  2. Multivitamin.  3. Diovan 160.  4. An aspirin a day.  5. Meloxicam.  6. Levothyroxine dose unknown.   EXAM:  Remarkable for a healthy-appearing middle-aged white female in no  distress.  Blood pressure 131/53 with a mildly widened pulse pressure, pulse 57,  weight 135 and afebrile, respiratory rate 14.  HEENT:  Remarkable for a rash on her forehead.  She has fairly flat  papular-type lesions over the forehead.  There are a couple under her  eyes.  They are not erythematous.  They are not scaly, but I do not know  exactly what they are.  No carotid bruits.  No lymphadenopathy,  thyromegaly, or JVP elevation.  Carotids are not bisferiens.  LUNGS:  Clear with good diaphragmatic motion.  No wheezing.  S1, S2 with an aortic insufficiency murmur.  ABDOMEN:  Benign.  Bowel sounds are positive.  No AAA.  No  hepatosplenomegaly.  No hepatojugular reflux.  Distal pulses are intact.  No edema.  NEURO:  Nonfocal.  No muscular weakness.  SKIN:  Warm and dry other than a rash on her forehead.   EKG shows an IVCD, which is chronic.  Poor R wave progression, left axis  deviation.   IMPRESSION:  1. Aortic insufficiency, followup echo.  No need for SBE prophylaxis.      Continue afterload reduction.  2. Hypertension.  Low salt diet.  Continue Diovan 160 a day.  3. Rash on forehead, somewhat concerning given her history of squamous      cell carcinoma and melanoma.  Followup with Dr. Donzetta Starch next      week.  We will try to call and expedite the appointment.  4. Recently diagnosed hypothyroidism, follow up with Dr. Lorenz Coaster.  TSH      and T4.  Continue Synthroid at current dose.  5. Intraventricular conduction defect with abnormal EKG.  It really      has not changed at all since comparison in May 2007.  I suspect      that we will want to do a stress test on her next year since it has      been 2 years since her last one.  6. Arthritis.  Currently  fairly stable.  Continue aspirin therapy and      meloxicam p.r.n.   I will see her back in about 6 months so long as her echo does not show  a change in the severity of her AR or left ventricular function.     [name]. Eden Emms, MD, [facility]  Electronically Signed    PCN/MedQ  DD: 06/10/2007  DT: 06/10/2007  Job #: [id number]

## 2010-11-18 NOTE — Procedures (Signed)
DUPLEX DEEP VENOUS EXAM - LOWER EXTREMITY   INDICATION:  Right leg pain for 2 months.   HISTORY:  Edema:  Two months.  Trauma/Surgery:  Basal cell carcinoma removal, right calf.  Pain:  Yes.  PE:  No.  Previous DVT:  No.  Anticoagulants:  No.  Other:   DUPLEX EXAM:                CFV   SFV   PopV  PTV    GSV                R  L  R  L  R  L  R   L  R  L  Thrombosis    o  o  o     o     o      o  Spontaneous   +  +  +     +     o      +  Phasic        +  +  +     +     o      +  Augmentation  +  +  +     +     +      +  Compressible  +  +  +     +     o      +  Competent     +  +  +     +     o      +   Legend:  + - yes  o - no  p - partial  D - decreased   IMPRESSION:  1. No evidence of deep venous thrombosis in the right lower extremity.  2. Left common femoral vein is within normal limits.   Results called to Fannie Knee Drinkard on 07/04/2010 at 2:30 p.m.     _____________________________  Larina Earthly, M.D.   LT/MEDQ  D:  07/04/2010  T:  07/04/2010  Job:  [id number]

## 2010-11-21 ENCOUNTER — Encounter (HOSPITAL_BASED_OUTPATIENT_CLINIC_OR_DEPARTMENT_OTHER): Payer: Medicare Other | Admitting: Oncology

## 2010-11-21 DIAGNOSIS — D0590 Unspecified type of carcinoma in situ of unspecified breast: Secondary | ICD-10-CM

## 2010-11-21 DIAGNOSIS — C9000 Multiple myeloma not having achieved remission: Secondary | ICD-10-CM

## 2010-11-21 NOTE — Op Note (Signed)
NAMEJAMIN, Natasha Jackson                            ACCOUNT NO.:  [id number]   MEDICAL RECORD NO.:  [id number]                   PATIENT TYPE:  AMB   LOCATION:  ENDO                                 FACILITY:  [facility]   PHYSICIAN:  Petra Kuba, M.D.                 DATE OF BIRTH:  [birth year]   DATE OF PROCEDURE:  08/29/2003  DATE OF DISCHARGE:                                 OPERATIVE REPORT   PROCEDURE:  Colonoscopy.   INDICATIONS FOR PROCEDURE:  Screening.  Consent was signed after risks,  benefits, methods, and options were thoroughly discussed in the office.   MEDICATIONS USED:  Demerol 70, Versed 7.   PROCEDURE:  Rectal inspection was pertinent for external hemorrhoids, small.  Digital exam was negative.  First, the pediatric video adjustable  colonoscope was inserted but due to increased looping and tortuosity was  unable to advance through the sigmoid despite abdominal pressure, so we  elected to withdraw.  Other than a rare occasional left sided diverticula,  no abnormalities were seen.  Anorectal pull through and retroflexion  confirmed some small hemorrhoids and a small anal papillae.  The scope was  removed and we inserted the upper endoscope and, again, we turned her on her  left side and had difficulty getting through this area, but in rolling her  on her back, we were able to advance through the sigmoid.  Once through the  sigmoid, with abdominal pressure, was able to be advanced to the mid  transverse.  With withdrawing the loop, we were easily able to advance to  the cecum which was identified by the appendiceal orifice and the ileocecal  valve.  The prep on the right side was fair, there was some stool adherent  to the wall which could not be washed off, the rest of the prep was  adequate.  On slow withdrawal through the colon, it was a tortuous colon and  when we fell back around the loop we tried to readvance around the curves to  decrease the chance of missing things,  but other than a rare left sided  diverticula, no abnormalities were seen.  The sigmoid was not well seen due  to tortuosity, looping, and some wall edema from advancing the scopes, but  no obvious abnormality was seen.  Again, anorectal pull through and  retroflexion back in the rectum confirmed the above findings.  The scope was  straightened, air was suctioned, scope removed.  The patient tolerated the  procedure well.  There was no obvious complications.   ENDOSCOPIC DIAGNOSIS:  1. Internal and external small hemorrhoids with a small anal papillae.  2. Tortuous looping sigmoid.  3. Rare left sided tiny diverticulum.  4. Unable to advance the pediatric colonoscope but able to advance the upper     endoscope to the cecum without any other abnormalities being seen.   PLAN:  If  doing well medically in five years, may want to consider a virtual  colonoscopy at that junction if widely available.  Happy to see back p.r.n.  Otherwise, return to the care of Dr. Lorenz Coaster for the customary health care  maintenance to include yearly rectals and guaiacs.                                               Petra Kuba, M.D.    MEM/MEDQ  D:  08/29/2003  T:  08/29/2003  Job:  [id number]   cc:   Reuben Likes, M.D.  [address]  [place]  [place] [address]  Fax: [phone]

## 2010-11-21 NOTE — Assessment & Plan Note (Signed)
[facility]                            CARDIOLOGY OFFICE NOTE   [name], [name]                         MRN:          [id number]  DATE:06/08/2006                            DOB:          1925-12-31    Natasha Jackson returns today for followup.  She has moderate aortic insufficiency.  She had a negative stress test in July 2006.   Her last echo done May 25, 2006, showed end diastolic dimension of  47 mm and then a systolic dimension of 28 mm with normal LV function  with moderate aortic insufficiency.   She has been on afterload reduction with Diovan 160 daily.  The patient  has had no significant shortness of breath, PND, or orthopnea.   MEDICATIONS:  Include:  1. Premarin 0.625 mg daily.  2. Diovan 160.  3. Aspirin daily.   EXAM:  HEENT:  Normal.  There is no widened pulse pressure.  VITAL SIGNS:  Blood pressure is 124/70.  Pulse is 80 and regular.  LUNGS:  Clear.  There is a diastolic AI murmur.  ABDOMEN:  Benign.  Lower extremities intact pulses.  No edema.  neuro: non-focal  Skin: warma and dry   IMPRESSION:  Moderate aortic insufficiency.  Good left ventricular size.  Afterload reduction with Diovan.  Followup echocardiogram in a year.  Subacute bacterial endocarditis prophylaxis will continue.  She is doing  well and is asymptomatic.     [name]. Eden Emms, MD, [facility]  Electronically Signed    PCN/MedQ  DD: 06/08/2006  DT: 06/09/2006  Job #: [id number]

## 2010-11-21 NOTE — Assessment & Plan Note (Signed)
[facility]                              CARDIOLOGY OFFICE NOTE   Natasha, Jackson                         MRN:          [id number]  DATE:03/11/2006                            DOB:          1925/11/08    Natasha Jackson returns today for followup.  She looks wonderful.  She has moderate  aortic insufficiency.  She is due to have a followup echocardiogram in  November.  She has not had any significant PND or orthopnea.  She does  occasionally feel a strong forceful pulse in her carotids.  I explained the  reason for this to her in regards to increased forward stroke volume.   The patient has been active.   Her teeth are in good shape.   PHYSICAL EXAMINATION:  VITAL SIGNS:  Blood pressure 120/60.  Her pulse  pressure is not particular wide.  She is in sinus rhythm at a rate of 60.  LUNGS:  Clear.  NECK:  Carotids were prominent.  CARDIOVASCULAR:  There was an S1 and S2 with an aortic insufficiency murmur.  ABDOMEN:  Benign.  EXTREMITIES:  Lower extremities with intact pulses and no edema.   She has had some problem with a bit of a cough.  I do not think this is from  her medications.  I think she has postnasal drip.  She apparently was  treated for some reflux empirically by Dr. Lorenz Jackson.  I explained to her that  taking some Sudafed or an expectorant at night may be helpful.   She is tolerating her Diovan at 160 a day for outflow reduction.   IMPRESSION:  Stable.  Moderate aortic insufficiency, asymptomatic.  Followup  echocardiogram to assess left ventricular size and compensation.   Continue Diovan.   Followup with Dr. Lorenz Jackson regarding postnasal drip and reflux.                               Natasha Jackson. Natasha Emms, MD, [facility]    PCN/MedQ  DD:  03/11/2006  DT:  03/11/2006  Job #:  [id number]   cc:   Natasha Jackson, M.D.

## 2010-11-21 NOTE — Op Note (Signed)
NAMEMISSI, MCMACKIN                  ACCOUNT NO.:  [id number]   MEDICAL RECORD NO.:  [id number]          PATIENT TYPE:  AMB   LOCATION:  DSC                          FACILITY:  [facility]   PHYSICIAN:  Leonie Man, M.D.   DATE OF BIRTH:  01-18-1926   DATE OF PROCEDURE:  07/02/2004  DATE OF DISCHARGE:                                 OPERATIVE REPORT   PREOPERATIVE DIAGNOSES:  Incisional hernias; 1 at the umbilicus and 2 in the  epigastrium.   POSTOPERATIVE DIAGNOSES:  Incisional hernias; 1 at the umbilicus and 2 in  the epigastrium.   PROCEDURE:  1.  Repair of incisional hernia at the umbilicus with mesh.  2.  Repair of incisional hernia in the epigastrium, no mesh.   ANESTHESIA:  General.   INDICATIONS FOR PROCEDURE:  Ms. Knack is a 77 year old lady who is status  post laparoscopic cholecystectomy.  She is also status post pelvic surgery  and presents with an incisional hernia presenting at the umbilicus, just  adjacent to her incision at the umbilicus, and again in the epigastrium just  adjacent to the epigastric port placement.  These have been causing some  pain and discomfort and she comes to the operating room for repair, after  the risks and potential benefits have been discussed.  All questions  answered and consent obtained.   DESCRIPTION OF PROCEDURE:  Following the induction of satisfactory general  anesthesia, the patient was positioned supinely and the abdomen prepped and  draped routinely.  The umbilical hernia incision was approached from an  infraumbilical incision, deepened through the skin and subcutaneous tissue,  carried down to the hernia sac and dissecting the hernia sac from the  underside of the umbilicus, carrying the dissection cephalad and inferior.  The sac was freed and carried down to the fascia, and the redundant sac was  removed and discarded.   The incarcerated omental contents were reduced back into the peritoneal  cavity.  I placed a Seprafilm  slurry into the abdominal cavity to ward off  adhesions to the mesh. I used a Bard mesh plug of medium size, and this was  sutured into the defect with interrupted  0-Novofil sutures.  This was placed in interrupted fashion.  The repair was  noted to be intact.  Sponge and instrument counts were verified.  The  subcutaneous tissues were closed in a running suture of 2-0 Vicryl, skin was  closed with running 4-0 Monocryl suture.   Attention was then turned to the epigastric area where the previously  transverse epigastric incision was opened up through the skin, and  subcutaneous tissue, deepened to the region of the hernia.  The hernia was  dissected free on all sides, and the incarcerated contents were reduced into  the peritoneal cavity.  There was no tension on the suture line, and the  defect was then closed directly with interrupted  0-Novofil sutures.  Sponge, instruments and sharps counts again verified.  All areas of dissection checked for hemostasis and noted to be dry.   Subcutaneous tissues closed with running 3-0 Vicryl suture  and skin was  closed with running 4-0 Monocryl suture. All the wounds were then reinforced  with Steri-Strips and sterile dressings applied, the anesthetic reversed.  The patient was removed from the operating room to the recovery room in  stable condition.  She tolerated the procedure well.      Patr   PB/MEDQ  D:  07/02/2004  T:  07/02/2004  Job:  [id number]   cc:   Reuben Likes, M.D.  [address]  [place]  [place] [address]  Fax: [phone]

## 2010-11-21 NOTE — Op Note (Signed)
[place]  Patient:    Natasha Jackson, Natasha Jackson Visit Number: [id number] MRN: [id number]          Service Type: DSU Location: [facility] Attending Physician:  Charlton Haws Dictated by:   Currie Paris, M.D. Proc. Date: 04/22/01 Admit Date:  04/22/2001   CC:         Gretta Cool, M.D.   Operative Report  ACCOUNT NO. [id number]. CCS [id number].  PREOPERATIVE DIAGNOSIS:  Ventral incisional hernia.  POSTOPERATIVE DIAGNOSIS:  Lipoma of incision.  PROCEDURE:  Excision lipoma, lower midline incision.  SURGEON:  Currie Paris, M.D.  ANESTHESIA:  MAC.  CLINICAL HISTORY:  This patient has presented with a soft, rounded mass of the lower midline incision, which I thought was some fat chronically incarcerated within a small ventral incisional hernia.  After discussion with the patient, we elected to explore the area with plans to repair the hernia.  DESCRIPTION OF PROCEDURE:  Patient brought to the operating room, and the area in question was identified and marked.  She was given IV sedation.  The area was prepped with Betadine and anesthetized with a combination of 1% Xylocaine mixed equally with 0.5% Marcaine.  The old scar was excised and the fatty protrusion noted immediately subcutaneously.  It was fairly well-circumscribed and using blunt dissection and a little bit of cautery, I was able to get around it and find that it was a lipoma, which did not protrude through the scar but was through the fascia.  To make sure that I had not overlooked a hernia, I went down to the fascia and exposed it circumferentially for a few centimeters just to be as sure as possible that I was not overlooking a small ventral hernia.  Once everything appeared intact, I went ahead and closed using some 3-0 Vicryl to tack the subcutaneous tissues down to the fascia to try to reduce the chance of a seroma and then closed the skin with some 4-0 Monocryl  subcuticular plus Steri-Strips.  The patient tolerated the procedure well.  There were no operative complications.  All counts were correct. Dictated by:   Currie Paris, M.D. Attending Physician:  Charlton Haws DD:  04/22/01 TD:  04/23/01 Job: [id number]

## 2010-11-21 NOTE — Op Note (Signed)
Natasha, Jackson                  ACCOUNT NO.:  [id number]   MEDICAL RECORD NO.:  [id number]          PATIENT TYPE:  AMB   LOCATION:  DSC                          FACILITY:  [facility]   PHYSICIAN:  Leonie Man, M.D.   DATE OF BIRTH:  [birth year]   DATE OF PROCEDURE:  01/27/2005  DATE OF DISCHARGE:                                 OPERATIVE REPORT   PREOPERATIVE DIAGNOSIS:  The left inguinal lymphadenopathy, rule out  recurrent melanoma.   POSTOPERATIVE DIAGNOSIS:  The left inguinal lymphadenopathy, rule out  recurrent melanoma.   PROCEDURE:  Excision of lymph node, left groin.   SURGEON:  Leonie Man, M.D.   ASSISTANT:  OR tech.   ANESTHESIA:  General.   Natasha Jackson is a 77 year old lady who underwent excision of a melanoma of the  right thigh approximately three years ago. She has recently noted a mass in  the right groin which is nontender and has not increased in size  significantly since it was discovered some two to three weeks prior to her  first visit with [place]. Because of her previous history of a melanoma of the  thigh, the patient comes to the operating room now for excision of this node  for pathologic evaluation.   PROCEDURE:  Following the induction of satisfactory general anesthesia, the  patient is positioned supinely and the left groin is prepped and draped to  be included in a sterile operative field. An obliquely placed groin incision  is made over the mass, deepened through skin and subcutaneous tissue with  dissection carried down to the lymph nodes. There were two moderately large  lymph nodes matted together and this were dissected free from the  surrounding soft tissue maintaining hemostasis with electrocautery during  the course of the dissection.  At the end of dissection, the masses were  removed and forwarded for pathologic evaluation. Hemostasis was assured with  electrocautery and sponge, instrument and sharp counts verified. The  subcutaneous  tissues were then closed with a  running 3-0 Vicryl suture. Skin closed with running 4-0 Monocryl suture and  reinforced with Steri-Strips. Sterile dressings applied. Anesthetic  reversed. The patient removed from the operating room to the recovery room  in stable condition. She tolerated the procedure well.       PB/MEDQ  D:  01/27/2005  T:  01/27/2005  Job:  [id number]   cc:   Reuben Likes, M.D.  [address]  [place]  [place] [address]  Fax: [phone]

## 2010-11-21 NOTE — Op Note (Signed)
[place]. [facility]  Patient:    Natasha Jackson, Natasha Jackson Visit Number: [id number] MRN: [id number]          Service Type: DSU Location: [facility] Attending Physician:  Gustavus Messing Dictated by:   Yaakov Guthrie. Shon Hough, M.D. Proc. Date: 09/29/01 Admit Date:  09/29/2001                             Operative Report  PREOPERATIVE DIAGNOSIS:  POSTOPERATIVE DIAGNOSIS:  PROCEDURES: 1. Wide excision of lesion. 2. Full-thickness skin graft coverage taken from the right groin.  SURGEON:  Yaakov Guthrie. Shon Hough, M.D.  ANESTHESIA:  MAC.  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  The area measures approximately 1 x 1.5-2 cm, diffuse, and reddish.  DESCRIPTION OF PROCEDURE:  Preop was done to the areas of the right lower leg and right groin using Betadine soap solution and walled off with sterile towels and drapes so as to make a sterile field.  One percent Xylocaine with epinephrine was injected locally.  A total 20 cc excision was made of the lesion widely down to underlying fat.  The specimen knife and pickups were then taken from the table.  Hemostasis was maintained with the Bovie on coagulation.  A pattern was then drawn and superimposed upon the right groin area.  Full-thickness skin graft taken in elliptical fashion in the groin crease line down to underlying subcutaneous tissue.  Superior and inferior flaps were freed and then subcutaneous closure was done with 3-0 Vicryl x 2 layers and then a running subcuticular stitch of 3-0 Vicryl.  The full-thickness skin graft was defatted and then placed over the defect and secured with 4-0 Prolene.  A standard dressing was made using Xeroform, 4 x 4s, Kerlix, and ABD.  Pathology was sent for frozen sections, showing clearance of the margins.  The patient will be discharged today to be seen back in my office in about five days.  She is to  call for any medical problems.  Medications will include Tylox and Duricef for analgesia and antibiotic coverage respectively. Dictated by:   Yaakov Guthrie. Shon Hough, M.D. Attending Physician:  Gustavus Messing DD:  09/29/01 TD:  09/29/01 Job: [id number]

## 2010-11-28 ENCOUNTER — Encounter: Payer: Self-pay | Admitting: Cardiovascular Disease

## 2010-12-05 ENCOUNTER — Other Ambulatory Visit: Payer: Self-pay | Admitting: Oncology

## 2010-12-05 ENCOUNTER — Encounter (HOSPITAL_BASED_OUTPATIENT_CLINIC_OR_DEPARTMENT_OTHER): Payer: Medicare Other | Admitting: Oncology

## 2010-12-05 DIAGNOSIS — D0590 Unspecified type of carcinoma in situ of unspecified breast: Secondary | ICD-10-CM

## 2010-12-05 DIAGNOSIS — C9000 Multiple myeloma not having achieved remission: Secondary | ICD-10-CM

## 2010-12-05 LAB — CBC WITH DIFFERENTIAL/PLATELET
Basophils Absolute: 0 10*3/uL (ref 0.0–0.1)
Eosinophils Absolute: 0.1 10*3/uL (ref 0.0–0.5)
HGB: 10.6 g/dL — ABNORMAL LOW (ref 11.6–15.9)
LYMPH%: 17.2 % (ref 14.0–49.7)
MCV: 96 fL (ref 79.5–101.0)
MONO%: 14.2 % — ABNORMAL HIGH (ref 0.0–14.0)
NEUT#: 2.2 10*3/uL (ref 1.5–6.5)
Platelets: 143 10*3/uL — ABNORMAL LOW (ref 145–400)

## 2010-12-05 LAB — BASIC METABOLIC PANEL
CO2: 28 mEq/L (ref 19–32)
Calcium: 8.4 mg/dL (ref 8.4–10.5)
Creatinine, Ser: 0.6 mg/dL (ref 0.50–1.10)
Glucose, Bld: 88 mg/dL (ref 70–99)

## 2010-12-08 ENCOUNTER — Encounter: Payer: Self-pay | Admitting: Cardiovascular Disease

## 2010-12-08 ENCOUNTER — Ambulatory Visit (INDEPENDENT_AMBULATORY_CARE_PROVIDER_SITE_OTHER): Payer: Medicare Other | Admitting: Cardiovascular Disease

## 2010-12-08 DIAGNOSIS — I359 Nonrheumatic aortic valve disorder, unspecified: Secondary | ICD-10-CM

## 2010-12-08 DIAGNOSIS — I059 Rheumatic mitral valve disease, unspecified: Secondary | ICD-10-CM

## 2010-12-08 DIAGNOSIS — I351 Nonrheumatic aortic (valve) insufficiency: Secondary | ICD-10-CM

## 2010-12-08 DIAGNOSIS — I340 Nonrheumatic mitral (valve) insufficiency: Secondary | ICD-10-CM

## 2010-12-08 DIAGNOSIS — Z780 Asymptomatic menopausal state: Secondary | ICD-10-CM

## 2010-12-08 DIAGNOSIS — C9000 Multiple myeloma not having achieved remission: Secondary | ICD-10-CM

## 2010-12-08 NOTE — Assessment & Plan Note (Signed)
Sympathomimetic effect of Effexor not great for heart and patient has stomach intolerance.  FU ob/gyn to seek alternative Rx

## 2010-12-08 NOTE — Progress Notes (Signed)
Natasha Jackson is seen today for F/U of her AR and HTN. She has been through a lot lately. She had her therapy for breast cancer including XRT. unfortunately she has now been diagnosed with multiple myeloma. This was worked up because of anemia. As confirmed by bone marrow biopsy. She apparently has bone lesions on her forehead and right leg. His been seen for second opinion at [facility]. Her M spike is declining and her disease seems to be stabilized for now.. Her revlamid is down to 10mg   Her cardiac standpoint her hypertension is well controlled. She has moderate aortic insufficiency. I don't think the XRT would've involved her aortic valve as they are able to target radiation therapy very well these days.. She has not had significant LV cavity dilatation or dysfunction in the past. She has not had any exertional dyspnea PND or orthopnea no palpitations or syncope. Her functional status is still excellent. I reviewed her echo from12/11  and she has mild LVE, moderate AR with trileaflet valve and moderate MR.  On Metanx for neuropathy in R foot.  Tried Effexor for hot flashes but stomach could not tolerate it.  Encouraged her to F/U with Dr Milinda Pointer oncology regarding need for FU bone survey and Dr Duane Lope OB/GYN regarding alternative Rx for hot flahes  ROS: Denies fever, malais, weight loss, blurry vision, decreased visual acuity, cough, sputum, SOB, hemoptysis, pleuritic pain, palpitaitons, heartburn, abdominal pain, melena, lower extremity edema, claudication, or rash.  All other systems reviewed and negative  General: Affect appropriate Healthy:  appears stated age HEENT: normal Neck supple with no adenopathy JVP normal no bruits no thyromegaly Lungs clear with no wheezing and good diaphragmatic motion Heart:  S1/S2 AS/AR   Murmur, no rub, gallop or click PMI normal Abdomen: benighn, BS positve, no tenderness, no AAA no bruit.  No HSM or HJR Distal pulses intact with no bruits No edema Neuro non-focal Skin  warm and dry No muscular weakness   Current Outpatient Prescriptions  Medication Sig Dispense Refill  . aspirin 325 MG tablet Take 325 mg by mouth daily.        . Lenalidomide (REVLIMID PO) 1 tab daily for 21 days then wait 8 days and start again       . levothyroxine (SYNTHROID, LEVOTHROID) 25 MCG tablet Take 25 mcg by mouth daily.        . Multiple Vitamin (MULTIVITAMIN) tablet Take 1 tablet by mouth daily.          Allergies  Review of patient's allergies indicates not on file.  Electrocardiogram:  Assessment and Plan

## 2010-12-08 NOTE — Patient Instructions (Addendum)
Your physician recommends that you schedule a follow-up appointment in: December 2012 WITH  DR Eden Emms ECHO SAME DAY  Your physician recommends that you continue on your current medications as directed. Please refer to the Current Medication list given to you today.  Your physician has requested that you have an echocardiogram. Echocardiography is a painless test that uses sound waves to create images of your heart. It provides your doctor with information about the size and shape of your heart and how well your heart's chambers and valves are working. This procedure takes approximately one hour. There are no restrictions for this procedure. DUE IN DECEMBER  SEE DR Eden Emms SAME DAY DX AS AND MR

## 2010-12-08 NOTE — Assessment & Plan Note (Signed)
Murmur not impressive. F/U echo 12/12.  Did not tolerate Diovan.  Consider ARB if LV appears dilated or AR worsens

## 2010-12-08 NOTE — Assessment & Plan Note (Signed)
F/U Dr Milinda Pointer.  Probably needs bone survey.  Continue Revlamid and Metanx.  M spike improved

## 2010-12-09 LAB — PROTEIN ELECTROPHORESIS, SERUM: Gamma Globulin: 11.9 % (ref 11.1–18.8)

## 2011-01-02 ENCOUNTER — Encounter (HOSPITAL_BASED_OUTPATIENT_CLINIC_OR_DEPARTMENT_OTHER): Payer: Medicare Other | Admitting: Oncology

## 2011-01-02 ENCOUNTER — Other Ambulatory Visit: Payer: Self-pay | Admitting: Oncology

## 2011-01-02 DIAGNOSIS — D0590 Unspecified type of carcinoma in situ of unspecified breast: Secondary | ICD-10-CM

## 2011-01-02 DIAGNOSIS — C9000 Multiple myeloma not having achieved remission: Secondary | ICD-10-CM

## 2011-01-02 LAB — CBC WITH DIFFERENTIAL/PLATELET
BASO%: 0.6 % (ref 0.0–2.0)
EOS%: 2.6 % (ref 0.0–7.0)
LYMPH%: 15.5 % (ref 14.0–49.7)
MCHC: 34.5 g/dL (ref 31.5–36.0)
MONO#: 0.4 10*3/uL (ref 0.1–0.9)
Platelets: 157 10*3/uL (ref 145–400)
RBC: 3.17 10*6/uL — ABNORMAL LOW (ref 3.70–5.45)
WBC: 3.9 10*3/uL (ref 3.9–10.3)
lymph#: 0.6 10*3/uL — ABNORMAL LOW (ref 0.9–3.3)

## 2011-01-02 LAB — BASIC METABOLIC PANEL
CO2: 28 mEq/L (ref 19–32)
Calcium: 8.9 mg/dL (ref 8.4–10.5)
Sodium: 138 mEq/L (ref 135–145)

## 2011-01-06 LAB — PROTEIN ELECTROPHORESIS, SERUM
Albumin ELP: 62.8 % (ref 55.8–66.1)
Alpha-1-Globulin: 4.7 % (ref 2.9–4.9)
Alpha-2-Globulin: 11.3 % (ref 7.1–11.8)
Gamma Globulin: 11.5 % (ref 11.1–18.8)

## 2011-01-22 ENCOUNTER — Other Ambulatory Visit: Payer: Self-pay | Admitting: Obstetrics and Gynecology

## 2011-01-30 ENCOUNTER — Other Ambulatory Visit: Payer: Self-pay | Admitting: Medical

## 2011-01-30 ENCOUNTER — Encounter (HOSPITAL_BASED_OUTPATIENT_CLINIC_OR_DEPARTMENT_OTHER): Payer: Medicare Other | Admitting: Oncology

## 2011-01-30 DIAGNOSIS — Z170 Estrogen receptor positive status [ER+]: Secondary | ICD-10-CM

## 2011-01-30 DIAGNOSIS — C9000 Multiple myeloma not having achieved remission: Secondary | ICD-10-CM

## 2011-01-30 DIAGNOSIS — D0590 Unspecified type of carcinoma in situ of unspecified breast: Secondary | ICD-10-CM

## 2011-01-30 LAB — COMPREHENSIVE METABOLIC PANEL
BUN: 13 mg/dL (ref 6–23)
CO2: 28 mEq/L (ref 19–32)
Calcium: 8.6 mg/dL (ref 8.4–10.5)
Chloride: 100 mEq/L (ref 96–112)
Creatinine, Ser: 0.69 mg/dL (ref 0.50–1.10)
Glucose, Bld: 85 mg/dL (ref 70–99)
Total Bilirubin: 0.3 mg/dL (ref 0.3–1.2)

## 2011-01-30 LAB — CBC WITH DIFFERENTIAL/PLATELET
Basophils Absolute: 0.1 10*3/uL (ref 0.0–0.1)
Eosinophils Absolute: 0.1 10*3/uL (ref 0.0–0.5)
HCT: 32 % — ABNORMAL LOW (ref 34.8–46.6)
HGB: 10.9 g/dL — ABNORMAL LOW (ref 11.6–15.9)
LYMPH%: 19.9 % (ref 14.0–49.7)
MCHC: 34.1 g/dL (ref 31.5–36.0)
MONO#: 0.6 10*3/uL (ref 0.1–0.9)
NEUT#: 2.4 10*3/uL (ref 1.5–6.5)
NEUT%: 59.3 % (ref 38.4–76.8)
Platelets: 161 10*3/uL (ref 145–400)
WBC: 4.1 10*3/uL (ref 3.9–10.3)

## 2011-02-03 LAB — PROTEIN ELECTROPHORESIS, SERUM
Albumin ELP: 63.2 % (ref 55.8–66.1)
Alpha-2-Globulin: 11.5 % (ref 7.1–11.8)
Beta Globulin: 5.8 % (ref 4.7–7.2)
Total Protein, Serum Electrophoresis: 6 g/dL (ref 6.0–8.3)

## 2011-02-03 LAB — KAPPA/LAMBDA LIGHT CHAINS
Kappa free light chain: 1.8 mg/dL (ref 0.33–1.94)
Kappa:Lambda Ratio: 0.81 (ref 0.26–1.65)
Lambda Free Lght Chn: 2.21 mg/dL (ref 0.57–2.63)

## 2011-03-02 ENCOUNTER — Encounter (HOSPITAL_BASED_OUTPATIENT_CLINIC_OR_DEPARTMENT_OTHER): Payer: Medicare Other | Admitting: Oncology

## 2011-03-02 ENCOUNTER — Other Ambulatory Visit: Payer: Self-pay | Admitting: Oncology

## 2011-03-02 DIAGNOSIS — D0590 Unspecified type of carcinoma in situ of unspecified breast: Secondary | ICD-10-CM

## 2011-03-02 DIAGNOSIS — C9000 Multiple myeloma not having achieved remission: Secondary | ICD-10-CM

## 2011-03-02 LAB — CBC WITH DIFFERENTIAL/PLATELET
BASO%: 0.3 % (ref 0.0–2.0)
Eosinophils Absolute: 0.1 10*3/uL (ref 0.0–0.5)
LYMPH%: 22.7 % (ref 14.0–49.7)
MCHC: 33.6 g/dL (ref 31.5–36.0)
MONO#: 0.4 10*3/uL (ref 0.1–0.9)
MONO%: 13.3 % (ref 0.0–14.0)
NEUT#: 1.8 10*3/uL (ref 1.5–6.5)
Platelets: 169 10*3/uL (ref 145–400)
RBC: 3.19 10*6/uL — ABNORMAL LOW (ref 3.70–5.45)
RDW: 16.1 % — ABNORMAL HIGH (ref 11.2–14.5)
WBC: 3 10*3/uL — ABNORMAL LOW (ref 3.9–10.3)

## 2011-03-05 ENCOUNTER — Encounter (HOSPITAL_BASED_OUTPATIENT_CLINIC_OR_DEPARTMENT_OTHER): Payer: Medicare Other | Admitting: Oncology

## 2011-03-05 ENCOUNTER — Other Ambulatory Visit: Payer: Self-pay | Admitting: Oncology

## 2011-03-05 DIAGNOSIS — D0590 Unspecified type of carcinoma in situ of unspecified breast: Secondary | ICD-10-CM

## 2011-03-05 DIAGNOSIS — C9000 Multiple myeloma not having achieved remission: Secondary | ICD-10-CM

## 2011-03-05 LAB — CORTISOL: Cortisol, Plasma: 10.3 ug/dL

## 2011-03-17 ENCOUNTER — Other Ambulatory Visit: Payer: Self-pay | Admitting: Dermatology

## 2011-04-16 ENCOUNTER — Other Ambulatory Visit: Payer: Self-pay | Admitting: Internal Medicine

## 2011-04-16 DIAGNOSIS — H532 Diplopia: Secondary | ICD-10-CM

## 2011-04-22 ENCOUNTER — Other Ambulatory Visit: Payer: Medicare Other

## 2011-04-27 ENCOUNTER — Ambulatory Visit
Admission: RE | Admit: 2011-04-27 | Discharge: 2011-04-27 | Disposition: A | Discharge status: Home / Self Care | Payer: Medicare Other | Source: Ambulatory Visit | Attending: Internal Medicine | Admitting: Internal Medicine

## 2011-04-27 DIAGNOSIS — H532 Diplopia: Secondary | ICD-10-CM

## 2011-04-27 MED ORDER — GADOBENATE DIMEGLUMINE 529 MG/ML IV SOLN
12.0000 mL | Freq: Once | INTRAVENOUS | Status: AC | PRN
  Administered 2011-04-27: 12 mL via INTRAVENOUS

## 2011-04-29 ENCOUNTER — Other Ambulatory Visit: Payer: Self-pay | Admitting: Oncology

## 2011-04-29 ENCOUNTER — Encounter (HOSPITAL_BASED_OUTPATIENT_CLINIC_OR_DEPARTMENT_OTHER): Payer: Medicare Other | Admitting: Oncology

## 2011-04-29 DIAGNOSIS — D0590 Unspecified type of carcinoma in situ of unspecified breast: Secondary | ICD-10-CM

## 2011-04-29 DIAGNOSIS — C9000 Multiple myeloma not having achieved remission: Secondary | ICD-10-CM

## 2011-04-29 LAB — CBC WITH DIFFERENTIAL/PLATELET
Basophils Absolute: 0 10*3/uL (ref 0.0–0.1)
EOS%: 0.5 % (ref 0.0–7.0)
HCT: 31.6 % — ABNORMAL LOW (ref 34.8–46.6)
HGB: 10.7 g/dL — ABNORMAL LOW (ref 11.6–15.9)
LYMPH%: 29.2 % (ref 14.0–49.7)
MCH: 33.1 pg (ref 25.1–34.0)
MCV: 98.2 fL (ref 79.5–101.0)
MONO%: 12.2 % (ref 0.0–14.0)
NEUT%: 57.6 % (ref 38.4–76.8)

## 2011-05-01 ENCOUNTER — Encounter: Payer: Self-pay | Admitting: *Deleted

## 2011-05-01 LAB — COMPREHENSIVE METABOLIC PANEL
AST: 47 U/L — ABNORMAL HIGH (ref 0–37)
Alkaline Phosphatase: 91 U/L (ref 39–117)
BUN: 17 mg/dL (ref 6–23)
Creatinine, Ser: 0.81 mg/dL (ref 0.50–1.10)

## 2011-05-01 LAB — PROTEIN ELECTROPHORESIS, SERUM
Alpha-1-Globulin: 4.6 % (ref 2.9–4.9)
Alpha-2-Globulin: 11.5 % (ref 7.1–11.8)
Beta 2: 3.4 % (ref 3.2–6.5)
Gamma Globulin: 12.6 % (ref 11.1–18.8)

## 2011-05-01 LAB — KAPPA/LAMBDA LIGHT CHAINS
Kappa free light chain: 2.94 mg/dL — ABNORMAL HIGH (ref 0.33–1.94)
Kappa:Lambda Ratio: 1.2 (ref 0.26–1.65)

## 2011-05-04 ENCOUNTER — Other Ambulatory Visit: Payer: Self-pay | Admitting: Dermatology

## 2011-06-01 ENCOUNTER — Other Ambulatory Visit: Payer: Self-pay | Admitting: Oncology

## 2011-06-01 ENCOUNTER — Other Ambulatory Visit (HOSPITAL_BASED_OUTPATIENT_CLINIC_OR_DEPARTMENT_OTHER): Payer: Medicare Other | Admitting: Lab

## 2011-06-01 DIAGNOSIS — C9000 Multiple myeloma not having achieved remission: Secondary | ICD-10-CM

## 2011-06-01 DIAGNOSIS — D0590 Unspecified type of carcinoma in situ of unspecified breast: Secondary | ICD-10-CM

## 2011-06-01 LAB — CBC WITH DIFFERENTIAL/PLATELET
Eosinophils Absolute: 0 10*3/uL (ref 0.0–0.5)
HCT: 33.6 % — ABNORMAL LOW (ref 34.8–46.6)
LYMPH%: 16.9 % (ref 14.0–49.7)
MONO#: 0.4 10*3/uL (ref 0.1–0.9)
NEUT#: 3.2 10*3/uL (ref 1.5–6.5)
Platelets: 197 10*3/uL (ref 145–400)
RBC: 3.35 10*6/uL — ABNORMAL LOW (ref 3.70–5.45)
WBC: 4.3 10*3/uL (ref 3.9–10.3)
lymph#: 0.7 10*3/uL — ABNORMAL LOW (ref 0.9–3.3)

## 2011-06-03 LAB — COMPREHENSIVE METABOLIC PANEL
Albumin: 4.2 g/dL (ref 3.5–5.2)
CO2: 23 mEq/L (ref 19–32)
Calcium: 9.4 mg/dL (ref 8.4–10.5)
Chloride: 104 mEq/L (ref 96–112)
Glucose, Bld: 86 mg/dL (ref 70–99)
Sodium: 139 mEq/L (ref 135–145)
Total Bilirubin: 0.5 mg/dL (ref 0.3–1.2)
Total Protein: 6.2 g/dL (ref 6.0–8.3)

## 2011-06-03 LAB — PROTEIN ELECTROPHORESIS, SERUM
Albumin ELP: 63.9 % (ref 55.8–66.1)
Beta Globulin: 5.3 % (ref 4.7–7.2)
Total Protein, Serum Electrophoresis: 6.2 g/dL (ref 6.0–8.3)

## 2011-06-03 LAB — KAPPA/LAMBDA LIGHT CHAINS
Kappa free light chain: 1.38 mg/dL (ref 0.33–1.94)
Kappa:Lambda Ratio: 1.02 (ref 0.26–1.65)
Lambda Free Lght Chn: 1.35 mg/dL (ref 0.57–2.63)

## 2011-06-05 ENCOUNTER — Telehealth: Payer: Self-pay | Admitting: Oncology

## 2011-06-05 ENCOUNTER — Ambulatory Visit (HOSPITAL_BASED_OUTPATIENT_CLINIC_OR_DEPARTMENT_OTHER): Payer: Medicare Other | Admitting: Oncology

## 2011-06-05 VITALS — BP 152/68 | HR 57 | Temp 97.0°F | Resp 20 | Ht 64.5 in | Wt 129.1 lb

## 2011-06-05 DIAGNOSIS — Z853 Personal history of malignant neoplasm of breast: Secondary | ICD-10-CM

## 2011-06-05 DIAGNOSIS — C9000 Multiple myeloma not having achieved remission: Secondary | ICD-10-CM

## 2011-06-05 DIAGNOSIS — R5381 Other malaise: Secondary | ICD-10-CM

## 2011-06-05 DIAGNOSIS — D0590 Unspecified type of carcinoma in situ of unspecified breast: Secondary | ICD-10-CM

## 2011-06-05 DIAGNOSIS — G589 Mononeuropathy, unspecified: Secondary | ICD-10-CM

## 2011-06-05 NOTE — Progress Notes (Signed)
[facility]  OFFICE PROGRESS NOTE  DIAGNOSIS AND PAST THERAPY:   1. 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  She finished adjuvant radiation therapy May 25, 2008.  She was on adjuvant tamoxifen from November 2009 to June 2010, which was discontinued due to intolerance of hot flash/night sweat and new diagnosis of myeloma. 2.  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  CURRENT THERAPY: 1.  For history of DCIS, she is on watchful observation. 2.  For multiple myeloma, she was started on Revlimid/melphalan/dexamethasone.  She had grade 3 pancytopenia.  Therefore since the second cycle she has Jackson receiving Revlimid day 1, through 21 every 28 day and dexamet  INTERVAL HISTORY: Natasha Jackson 77 y.o. female returns for regular follow up.  She is here with her husband.  She reports feeling relatively well.  Despite stopping Revlimid 3 months ago, she still has cold sensation and sometime numbness in her bilateral ankles but spares her hands.  This sensation does not interfere with her activities of daily living.  She still has hot flash during the day time and not during the night time.  She does self breast exam but has not found any abnormal breast findings.  She has light appetite but stable weight.    Patient denies fatigue, headache, visual changes, confusion, drenching night sweats, palpable lymph node swelling, mucositis, odynophagia, dysphagia, nausea vomiting, jaundice,  chest pain, palpitation, shortness of breath, dyspnea on exertion, productive cough, gum bleeding, epistaxis, hematemesis, hemoptysis, abdominal pain, abdominal swelling, early satiety, melena, hematochezia, hematuria, skin rash, spontaneous bleeding, joint swelling, joint pain, heat or cold intolerance, bowel bladder incontinence, back pain, focal motor weakness, paresthesia, depression, suicidal or homocidal ideation, feeling hopelessness.   MEDICAL HISTORY: Past Medical History  Diagnosis Date  . Breast cancer   . Multiple myeloma   . Aortic regurgitation   . HTN (hypertension)     SURGICAL HISTORY:  Past Surgical History  Procedure Date  . Lymph node excision - left groin   . Incisional hernia repair     MEDICATIONS: Current Outpatient Prescriptions  Medication Sig Dispense Refill  . aspirin 325 MG tablet Take 325 mg by mouth daily.        [name] gabapentin (NEURONTIN) 100 MG capsule Take 100 mg by mouth 2 (two) times daily. May increase to twice daily if tolerates      . L-Methylfolate-B6-B12 (METANX PO) Take by mouth.        . levothyroxine (SYNTHROID, LEVOTHROID) 25 MCG tablet Take 50 mcg by mouth daily.       . meloxicam (MOBIC) 7.5 MG tablet       . Multiple Vitamin (MULTIVITAMIN) tablet Take 1 tablet by mouth daily.        [name] VITAMIN D-VITAMIN K PO Take 1 tablet by mouth daily.          ALLERGIES:   has no known allergies.  REVIEW OF SYSTEMS:  The rest of the 14-point review of system was  negative.   Filed Vitals:   06/05/11 1055  BP: 152/68  Pulse: 57  Temp: 97 F (36.1 C)  Resp: 20   Wt Readings from Last 3 Encounters:  06/05/11 129 lb 1.6 oz (58.559 kg)  03/05/11 128 lb 4.8 oz (58.196 kg)  12/08/10 129 lb (58.514 kg)   ECOG Performance status: 0  PHYSICAL EXAMINATION:   General:  well-nourished in no acute distress.  Eyes:  no scleral icterus.  ENT:  There were no oropharyngeal lesions.  Neck was without thyromegaly.  Lymphatics:  Negative cervical,  supraclavicular or axillary adenopathy.  Respiratory: lungs were clear bilaterally without wheezing or crackles.  Cardiovascular:  Regular rate and rhythm, S1/S2, without murmur, rub or gallop.  There was no pedal edema.  GI:  abdomen was soft, flat, nontender, nondistended, without organomegaly.  Muscoloskeletal:  no spinal tenderness of palpation of vertebral spine.  Skin exam was without echymosis, petichae.  Neuro exam was nonfocal.  Patient was able to get on and off exam table without assistance.  Gait was normal.  Patient was alerted and oriented.  Attention was good.   Language was appropriate.  Mood was normal without depression.  Speech was not pressured.  Thought content was not tangential.  Bilateral breast exam was negative.     LABORATORY/RADIOLOGY DATA: CBC    Component Value Date/Time   WBC 4.3 06/01/2011 1208   RBC 3.35* 06/01/2011 1208   HGB 11.5* 06/01/2011 1208   HCT 33.6* 06/01/2011 1208   PLT 197 06/01/2011 1208   MCV 100.3 06/01/2011 1208   MCH 34.4* 06/01/2011 1208   MCH 33.7 07/18/2010 1534   MCHC 34.3 06/01/2011 1208   RDW 16.7* 06/01/2011 1208   LYMPHSABS 0.7* 06/01/2011 1208   MONOABS 0.4 06/01/2011 1208   EOSABS 0.0 06/01/2011 1208   BASOSABS 0.0 06/01/2011 1208   Cr 0.8; Ca 9.4; normal kappa:lambda ratio; SPEP:  M-spike negative.    ASSESSMENT AND PLAN:   1. History of multiple myeloma:  I discussed with her that there is no evidence of disease recurrence despite being off of maintenance Revlimid.  Her cold sensation in the feet is thus unlikely due to Revlimid due to the fact that most drug-related neuropathy involves all extremities and not just low extremities and that she has Jackson off of Revlimid for 3 months, this should improve.  Regardless, she feels comfortable continuing with observation and not resuming Revlimid.  There is no definitive, robust overall survival benefit, just disease free survival with maintenance therapy.  Therefore, I support her  decision to continue observation.  Next SPEP and light chain here will be in 3 months.  2. Hot flash and night sweats secondary to history of menopause:  She does not want any med for this.  We discussed again the risk of hormonal replacement therapy and increased risk of recurrent breast cancer.  She does not want to resume hormonal replacement therapy.  3. Neuropathy:  On gabapentin per PCP. 4. Fatigue:  Improved from prior being off of Revlimid.  5. History of ductal carcinoma in situ:  On observation.  Her next mammogram is due in January 2013.  She declined any more therapy because of severe hot flash and night sweat.  The length of time of the face-to-face encounter was 30 minutes. More than 50% of time was spent counseling and coordination of care.

## 2011-06-05 NOTE — Telephone Encounter (Signed)
gve the pt her feb,march 2013 appt calendar

## 2011-06-06 ENCOUNTER — Other Ambulatory Visit: Payer: Self-pay | Admitting: Oncology

## 2011-06-08 ENCOUNTER — Telehealth: Payer: Self-pay | Admitting: Oncology

## 2011-06-08 NOTE — Telephone Encounter (Signed)
S/w the pt and she is aware of her cancelled appts for dec and jan. And to keep the appts for feb and march 2013

## 2011-06-22 ENCOUNTER — Other Ambulatory Visit: Payer: Medicare Other | Admitting: Lab

## 2011-07-20 ENCOUNTER — Telehealth: Payer: Self-pay | Admitting: *Deleted

## 2011-07-20 NOTE — Telephone Encounter (Signed)
Pt called to ask if she can have a referral from Dr. Gaylyn Rong for some Hot Water Therapy to help the neuropathy in her feet?  Per Dr. Gaylyn Rong, pt should speak w/ her PCP regarding this as he feels her neuropathy is not related to her Multiple Myeloma or her treatment here.   Called pt and instructed on above.  She states she has appt to see her Family Practitioner in Feb. And will ask him at that time.

## 2011-07-27 ENCOUNTER — Other Ambulatory Visit: Payer: Medicare Other | Admitting: Lab

## 2011-07-27 ENCOUNTER — Ambulatory Visit: Payer: Medicare Other | Admitting: Oncology

## 2011-08-12 ENCOUNTER — Telehealth: Payer: Self-pay

## 2011-08-12 NOTE — Telephone Encounter (Signed)
Pt called complaining of chest tightness off and on x 3-4 days (5/10 in severity).  The pain is worse when she takes a deep breath.  It is exertional and relieved by rest.  The chest tightness is accompanied by sob and nausea.  I told pt to go to the ER now.

## 2011-08-28 ENCOUNTER — Ambulatory Visit
Admission: RE | Admit: 2011-08-28 | Discharge: 2011-08-28 | Disposition: A | Discharge status: Home / Self Care | Payer: Medicare Other | Source: Ambulatory Visit | Attending: Oncology | Admitting: Oncology

## 2011-08-28 DIAGNOSIS — Z853 Personal history of malignant neoplasm of breast: Secondary | ICD-10-CM

## 2011-08-28 DIAGNOSIS — C9000 Multiple myeloma not having achieved remission: Secondary | ICD-10-CM

## 2011-09-03 ENCOUNTER — Other Ambulatory Visit (HOSPITAL_BASED_OUTPATIENT_CLINIC_OR_DEPARTMENT_OTHER): Payer: Medicare Other | Admitting: Lab

## 2011-09-03 DIAGNOSIS — Z853 Personal history of malignant neoplasm of breast: Secondary | ICD-10-CM

## 2011-09-03 DIAGNOSIS — C9000 Multiple myeloma not having achieved remission: Secondary | ICD-10-CM

## 2011-09-03 LAB — CBC WITH DIFFERENTIAL/PLATELET
BASO%: 0.2 % (ref 0.0–2.0)
EOS%: 0.6 % (ref 0.0–7.0)
MCH: 34.2 pg — ABNORMAL HIGH (ref 25.1–34.0)
MCHC: 33.8 g/dL (ref 31.5–36.0)
MCV: 101.2 fL — ABNORMAL HIGH (ref 79.5–101.0)
MONO%: 6.9 % (ref 0.0–14.0)
RDW: 13.8 % (ref 11.2–14.5)
lymph#: 1 10*3/uL (ref 0.9–3.3)

## 2011-09-08 LAB — COMPREHENSIVE METABOLIC PANEL
ALT: 18 U/L (ref 0–35)
AST: 30 U/L (ref 0–37)
Albumin: 3.8 g/dL (ref 3.5–5.2)
Alkaline Phosphatase: 61 U/L (ref 39–117)
BUN: 19 mg/dL (ref 6–23)
Calcium: 9.2 mg/dL (ref 8.4–10.5)
Chloride: 101 mEq/L (ref 96–112)
Creatinine, Ser: 0.96 mg/dL (ref 0.50–1.10)
Potassium: 4.2 mEq/L (ref 3.5–5.3)

## 2011-09-08 LAB — PROTEIN ELECTROPHORESIS, SERUM, WITH REFLEX
Alpha-1-Globulin: 4 % (ref 2.9–4.9)
Alpha-2-Globulin: 10.8 % (ref 7.1–11.8)
Beta 2: 3.9 % (ref 3.2–6.5)
Gamma Globulin: 11.3 % (ref 11.1–18.8)

## 2011-09-08 LAB — IFE INTERPRETATION

## 2011-09-08 LAB — KAPPA/LAMBDA LIGHT CHAINS: Kappa free light chain: 1.68 mg/dL (ref 0.33–1.94)

## 2011-09-10 ENCOUNTER — Ambulatory Visit (HOSPITAL_BASED_OUTPATIENT_CLINIC_OR_DEPARTMENT_OTHER): Payer: Medicare Other | Admitting: Oncology

## 2011-09-10 DIAGNOSIS — D0590 Unspecified type of carcinoma in situ of unspecified breast: Secondary | ICD-10-CM

## 2011-09-10 DIAGNOSIS — D0510 Intraductal carcinoma in situ of unspecified breast: Secondary | ICD-10-CM

## 2011-09-10 DIAGNOSIS — C9000 Multiple myeloma not having achieved remission: Secondary | ICD-10-CM

## 2011-09-13 NOTE — Progress Notes (Signed)
[facility] OFFICE PROGRESS NOTE  DIAGNOSIS AND PAST THERAPY:   1. 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  She finished adjuvant radiation therapy May 25, 2008.  She was on adjuvant tamoxifen from November 2009 to June 2010, which was discontinued due to intolerance of hot flash/night sweat and new diagnosis of myeloma. 2.  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  CURRENT THERAPY: 1.  For history of DCIS, she is on watchful observation. 2.  For multiple myeloma, she was started on Revlimid/melphalan/dexamethasone.  She had grade 3 pancytopenia.  Therefore since the second cycle she has been receiving Revlimid day 1, through 21 every 28 day and dexamet  INTERVAL HISTORY: Natasha Jackson 78 y.o. female returns for regular follow up with her husband.  She reports feeling well.  She still has neuropathy in the bilateral feet but not in her hands.  She is taking Neurontin with some improvement of her symptoms. She her dose of Neurontin was increased, she felt quite sleepy and thus is only taking Neurontin 100mg  PO daily at this time.  She still has hot flash but no night sweat.  She thinks that with taking Neurontin, her hot flash has also improved.  She performs self breast exam about once a month and has not found any breast abnormality.  With regard to her history of myeloma, she denies bone pain above and beyond her mild OA in hips and shoulders. She has mild fatigue; however,  she is independent of all activities of daily living.   Patient denies headache, visual changes, confusion, drenching night sweats, palpable lymph node swelling, mucositis, odynophagia, dysphagia, nausea vomiting, jaundice, chest pain, palpitation, shortness of breath, dyspnea on exertion, productive cough, gum bleeding, epistaxis, hematemesis, hemoptysis, abdominal pain, abdominal swelling, early satiety, melena, hematochezia, hematuria, skin rash, spontaneous bleeding, joint swelling, joint pain, heat or cold intolerance, bowel bladder incontinence, back pain, focal motor weakness, paresthesia, depression, suicidal or homocidal ideation, feeling hopelessness.    MEDICAL HISTORY: Past Medical History  Diagnosis Date  . Breast cancer   . Multiple myeloma   . Aortic regurgitation   . HTN (hypertension)     SURGICAL HISTORY:  Past Surgical History  Procedure Date  . Lymph node excision - left groin   . Incisional hernia repair     MEDICATIONS: Current Outpatient Prescriptions  Medication Sig Dispense Refill  . aspirin 325 MG tablet Take 325 mg by mouth daily.        [name] gabapentin (NEURONTIN) 100 MG capsule Take 100 mg by mouth at bedtime. May increase to twice daily if tolerates      . levothyroxine (SYNTHROID, LEVOTHROID) 25 MCG tablet Take 50 mcg by mouth daily.       . meloxicam (MOBIC) 7.5 MG tablet Take 7.5 mg by mouth 2 (two) times daily as needed.       . Multiple Vitamin (MULTIVITAMIN) tablet Take 1 tablet by mouth daily.        [name] VITAMIN  D-VITAMIN K PO Take 1 tablet by mouth daily.          ALLERGIES:   has no known allergies.  REVIEW OF SYSTEMS:  The rest of the 14-point review of system was negative.   Filed Vitals:   09/10/11 1454  BP: 147/63  Pulse: 58  Temp: 97.6 F (36.4 C)   Wt Readings from Last 3 Encounters:  09/10/11 133 lb 14.4 oz (60.737 kg)  06/05/11 129 lb 1.6 oz (58.559 kg)  03/05/11 128 lb 4.8 oz (58.196 kg)   ECOG Performance status:  0  PHYSICAL EXAMINATION:   General:  Thin-appearing woman in no acute distress.  Eyes:  no scleral icterus.  ENT:  There were no oropharyngeal lesions.  Neck was without thyromegaly.  Lymphatics:  Negative cervical, supraclavicular or axillary adenopathy.  Respiratory: lungs were clear bilaterally without wheezing or crackles.  Cardiovascular:  Regular rate and rhythm, S1/S2, without murmur, rub or gallop.  There was no pedal edema.  GI:  abdomen was soft, flat, nontender, nondistended, without organomegaly.  Muscoloskeletal:  no spinal tenderness of palpation of vertebral spine.  Skin exam was without echymosis, petichae.  Neuro exam was nonfocal.  Patient was able to get on and off exam table without assistance.  Gait was normal.  Patient was alerted and oriented.  Attention was good.   Language was appropriate.  Mood was normal without depression.  Speech was not pressured.  Thought content was not tangential.  Bilateral breast exam was negative.     LABORATORY/RADIOLOGY DATA: CBC    Component Value Date/Time   WBC 4.1 09/03/2011 1434   RBC 3.47* 09/03/2011 1434   HGB 11.9 09/03/2011 1434   HCT 35.1 09/03/2011 1434   PLT 184 09/03/2011 1434   MCV 101.2* 09/03/2011 1434   MCH 34.2* 09/03/2011 1434   MCH 33.7 07/18/2010 1534   MCHC 33.8 09/03/2011 1434   RDW 13.8 09/03/2011 1434   LYMPHSABS 1.0 09/03/2011 1434   MONOABS 0.3 09/03/2011 1434   EOSABS 0.0 09/03/2011 1434   BASOSABS 0.0 09/03/2011 1434   Cr 0.96; Ca 9.2; normal kappa:lambda ratio; SPEP:  M-spike negative.    ASSESSMENT AND PLAN:   1. History of multiple myeloma: There is no evidence of disease recurrence.  Her Hgb, Cr, Calcium were normal.  Her M-spike, light chains were both negative.  I again recommended watchful observation as she did not tolerate maintenance therapy Revlimid very well with fatigue and neuropathy.   2. Hot flash and night sweats secondary to history of menopause:  She has slight improvement with Neurontin.   3. Neuropathy:  Cannot tell whether this is 100% from Revlmid or not. Her symptoms persisted after discontinuation of Revlmid.  I advised her to increased gabapentin to either 200mg  PO qhs or 100mg  PO BID.  4. Fatigue:  Improved from prior being off of Revlimid.  5. History of ductal carcinoma in situ:  There is no evidence of disease recurrence.  On observation now.  She did not tolerate tamoxifen. Her las mammogram in Feb 2013 was negative.  Her next mammogram is due in Feb 2014.   6. Follow up:  Lab/RV with me in about 5 months.   The length of time of the face-to-face encounter was 15 minutes. More than 50% of time was spent counseling and coordination of care.

## 2011-11-02 ENCOUNTER — Other Ambulatory Visit: Payer: Self-pay | Admitting: Dermatology

## 2011-11-26 ENCOUNTER — Other Ambulatory Visit: Payer: Self-pay | Admitting: Cardiovascular Disease

## 2011-12-23 ENCOUNTER — Other Ambulatory Visit: Payer: Self-pay | Admitting: Oncology

## 2011-12-23 ENCOUNTER — Telehealth: Payer: Self-pay | Admitting: *Deleted

## 2011-12-23 ENCOUNTER — Encounter: Payer: Self-pay | Admitting: Oncology

## 2011-12-23 DIAGNOSIS — G620 Drug-induced polyneuropathy: Secondary | ICD-10-CM | POA: Insufficient documentation

## 2011-12-23 DIAGNOSIS — R232 Flushing: Secondary | ICD-10-CM | POA: Insufficient documentation

## 2011-12-23 DIAGNOSIS — T451X5A Adverse effect of antineoplastic and immunosuppressive drugs, initial encounter: Secondary | ICD-10-CM | POA: Insufficient documentation

## 2011-12-23 DIAGNOSIS — G629 Polyneuropathy, unspecified: Secondary | ICD-10-CM

## 2011-12-23 HISTORY — DX: Polyneuropathy, unspecified: G62.9

## 2011-12-23 HISTORY — DX: Flushing: R23.2

## 2011-12-23 MED ORDER — VENLAFAXINE HCL 25 MG PO TABS: 25.0000 mg | ORAL_TABLET | Freq: Two times a day (BID) | ORAL | Status: DC

## 2011-12-23 NOTE — Telephone Encounter (Signed)
VM from pt states wants to try Effexor again.  Has not taken in over a year, but heard it might help her neuropathy in her feet and she wants to try it again for her hot flashes as well.  Asks if Dr. Gaylyn Rong will be willing to re- prescribe the Effexor?

## 2011-12-23 NOTE — Telephone Encounter (Signed)
Called pt to notify Dr. Gaylyn Rong did prescribe the Effexor, Rx to [facility].  If effective and pt tolerates well then he may increase dose.  She verbalized understanding.

## 2012-01-05 ENCOUNTER — Ambulatory Visit (INDEPENDENT_AMBULATORY_CARE_PROVIDER_SITE_OTHER): Payer: Medicare Other | Admitting: Cardiovascular Disease

## 2012-01-05 ENCOUNTER — Encounter: Payer: Self-pay | Admitting: Cardiovascular Disease

## 2012-01-05 VITALS — BP 153/64 | HR 58 | Wt 132.0 lb

## 2012-01-05 DIAGNOSIS — G589 Mononeuropathy, unspecified: Secondary | ICD-10-CM

## 2012-01-05 DIAGNOSIS — I359 Nonrheumatic aortic valve disorder, unspecified: Secondary | ICD-10-CM

## 2012-01-05 DIAGNOSIS — G629 Polyneuropathy, unspecified: Secondary | ICD-10-CM

## 2012-01-05 DIAGNOSIS — I351 Nonrheumatic aortic (valve) insufficiency: Secondary | ICD-10-CM

## 2012-01-05 DIAGNOSIS — I10 Essential (primary) hypertension: Secondary | ICD-10-CM

## 2012-01-05 DIAGNOSIS — C9000 Multiple myeloma not having achieved remission: Secondary | ICD-10-CM

## 2012-01-05 DIAGNOSIS — I447 Left bundle-branch block, unspecified: Secondary | ICD-10-CM

## 2012-01-05 MED ORDER — LOSARTAN POTASSIUM 25 MG PO TABS: 25.0000 mg | ORAL_TABLET | Freq: Every day | ORAL | Status: DC

## 2012-01-05 NOTE — Assessment & Plan Note (Signed)
No help wit Metanx Continue neurontin

## 2012-01-05 NOTE — Assessment & Plan Note (Signed)
Stable no m-spike on recent lab work  F/U oncology

## 2012-01-05 NOTE — Assessment & Plan Note (Signed)
F/U echo Need to watch ascending aortic root as well.  Cozaar should help with BP and afterload reduction

## 2012-01-05 NOTE — Patient Instructions (Addendum)
Your physician wants you to follow-up in:  6  MONTHS WITH DR Haywood Filler will receive a reminder letter in the mail two months in advance. If you don't receive a letter, please call our office to schedule the follow-up appointment. Your physician has recommended you make the following change in your medication: START LOSARTAN 25 MG  1 EVERY DAY Your physician has requested that you have an echocardiogram. Echocardiography is a painless test that uses sound waves to create images of your heart. It provides your doctor with information about the size and shape of your heart and how well your heart's chambers and valves are working. This procedure takes approximately one hour. There are no restrictions for this procedure. DX AR

## 2012-01-05 NOTE — Assessment & Plan Note (Signed)
Add Cozaar Low sodium diet Advized to buy Omron cuff for home

## 2012-01-05 NOTE — Progress Notes (Signed)
Patient ID: Natasha Jackson, female   DOB: 16-Apr-1926, 78 y.o.   MRN: [id number] Jakerria is seen today for F/U of her AR and HTN  She is a breast cancer survivor and has myeloma that is stable.  She does have lytic bone lesions but no pain and is currently not on rvlimide   She has not had significant LV cavity dilatation or dysfunction in the past. She has not had any exertional dyspnea PND or orthopnea no palpitations or syncope. Her functional status is still excellent. I reviewed her echo from12/11 and she has mild LVE, moderate AR with trileaflet valve and moderate MR. On gabipentan  for neuropathy in R foot. Tried Effexor for hot flashes but stomach could not tolerate it. Encouraged her to F/U with Dr Milinda Pointer oncology regarding need for FU bone survey and Dr Duane Lope OB/GYN regarding alternative Rx for hot flahes.  BP seems to be running higher.  Discussed starting cozaar for this and AR   ROS: Denies fever, malais, weight loss, blurry vision, decreased visual acuity, cough, sputum, SOB, hemoptysis, pleuritic pain, palpitaitons, heartburn, abdominal pain, melena, lower extremity edema, claudication, or rash.  All other systems reviewed and negative  General: Affect appropriate Healthy:  appears stated age HEENT: normal Neck supple with no adenopathy JVP normal no bruits no thyromegaly Lungs clear with no wheezing and good diaphragmatic motion Heart:  S1/S2 AR  murmur, no rub, gallop or click PMI normal Abdomen: benighn, BS positve, no tenderness, no AAA no bruit.  No HSM or HJR Distal pulses intact with no bruits No edema Neuro non-focal Skin warm and dry No muscular weakness   Current Outpatient Prescriptions  Medication Sig Dispense Refill  . ALPHA LIPOIC ACID PO Take 1 tablet by mouth daily.      [name] gabapentin (NEURONTIN) 100 MG capsule Take 100 mg by mouth at bedtime. May increase to twice daily if tolerates      . levothyroxine (SYNTHROID, LEVOTHROID) 50 MCG tablet Take 50 mcg by mouth  daily.      . meloxicam (MOBIC) 7.5 MG tablet Take 7.5 mg by mouth 2 (two) times daily as needed.       [name] VITAMIN D-VITAMIN K PO Take 1 tablet by mouth daily.          Allergies  Review of patient's allergies indicates no known allergies.  Electrocardiogram:  NSR rate 58 LBBB LAD  Assessment and Plan

## 2012-01-05 NOTE — Assessment & Plan Note (Signed)
Stable with no change on ECG  NO syncope or high grade AV block

## 2012-01-12 ENCOUNTER — Ambulatory Visit (HOSPITAL_COMMUNITY): Payer: Medicare Other | Attending: Cardiology | Admitting: Radiology

## 2012-01-12 DIAGNOSIS — C9000 Multiple myeloma not having achieved remission: Secondary | ICD-10-CM | POA: Insufficient documentation

## 2012-01-12 DIAGNOSIS — I517 Cardiomegaly: Secondary | ICD-10-CM | POA: Insufficient documentation

## 2012-01-12 DIAGNOSIS — I059 Rheumatic mitral valve disease, unspecified: Secondary | ICD-10-CM | POA: Insufficient documentation

## 2012-01-12 DIAGNOSIS — I10 Essential (primary) hypertension: Secondary | ICD-10-CM | POA: Insufficient documentation

## 2012-01-12 DIAGNOSIS — C50919 Malignant neoplasm of unspecified site of unspecified female breast: Secondary | ICD-10-CM | POA: Insufficient documentation

## 2012-01-12 DIAGNOSIS — I351 Nonrheumatic aortic (valve) insufficiency: Secondary | ICD-10-CM

## 2012-01-12 DIAGNOSIS — I447 Left bundle-branch block, unspecified: Secondary | ICD-10-CM | POA: Insufficient documentation

## 2012-01-12 DIAGNOSIS — I359 Nonrheumatic aortic valve disorder, unspecified: Secondary | ICD-10-CM | POA: Insufficient documentation

## 2012-01-12 DIAGNOSIS — Z923 Personal history of irradiation: Secondary | ICD-10-CM | POA: Insufficient documentation

## 2012-01-12 NOTE — Progress Notes (Signed)
Echocardiogram performed.  

## 2012-01-15 ENCOUNTER — Telehealth: Payer: Self-pay | Admitting: *Deleted

## 2012-01-15 NOTE — Telephone Encounter (Signed)
SPOKE WITH PT RECEIVED MESSAGE WHEN PT WAS HERE FOR ECHO THE OTHER DAY C/O NAUSEA AND DIARRHEA  AFTER STARTING LOSARTAN  STOPPED MED AND SYMPTOMS RESOLVED . PT WILL MONITOR B/P AND CALL IN COUPLE OF WEEKS WITH READINGS  LAST READING WAS GOOD PER PT WILL FORWARD TO DR Eden Emms FOR REVIEW./CY

## 2012-01-15 NOTE — Telephone Encounter (Signed)
Ok to observe BP

## 2012-01-26 ENCOUNTER — Other Ambulatory Visit: Payer: Self-pay | Admitting: Dermatology

## 2012-01-29 ENCOUNTER — Other Ambulatory Visit (HOSPITAL_BASED_OUTPATIENT_CLINIC_OR_DEPARTMENT_OTHER): Payer: Medicare Other | Admitting: Lab

## 2012-01-29 DIAGNOSIS — C9000 Multiple myeloma not having achieved remission: Secondary | ICD-10-CM

## 2012-01-29 DIAGNOSIS — D0510 Intraductal carcinoma in situ of unspecified breast: Secondary | ICD-10-CM

## 2012-01-29 DIAGNOSIS — D0590 Unspecified type of carcinoma in situ of unspecified breast: Secondary | ICD-10-CM

## 2012-01-29 LAB — COMPREHENSIVE METABOLIC PANEL
ALT: 16 U/L (ref 0–35)
CO2: 29 mEq/L (ref 19–32)
Calcium: 9 mg/dL (ref 8.4–10.5)
Chloride: 102 mEq/L (ref 96–112)
Creatinine, Ser: 0.76 mg/dL (ref 0.50–1.10)
Glucose, Bld: 81 mg/dL (ref 70–99)
Sodium: 137 mEq/L (ref 135–145)
Total Protein: 6.1 g/dL (ref 6.0–8.3)

## 2012-01-29 LAB — CBC WITH DIFFERENTIAL/PLATELET
BASO%: 0.5 % (ref 0.0–2.0)
HGB: 11.5 g/dL — ABNORMAL LOW (ref 11.6–15.9)
MCH: 34.1 pg — ABNORMAL HIGH (ref 25.1–34.0)
MCV: 101.8 fL — ABNORMAL HIGH (ref 79.5–101.0)
NEUT%: 69.9 % (ref 38.4–76.8)
Platelets: 174 10*3/uL (ref 145–400)
RBC: 3.38 10*6/uL — ABNORMAL LOW (ref 3.70–5.45)
RDW: 13.6 % (ref 11.2–14.5)

## 2012-02-02 LAB — PROTEIN ELECTROPHORESIS, SERUM
M-Spike, %: 0.26 g/dL
Total Protein, Serum Electrophoresis: 6 g/dL (ref 6.0–8.3)

## 2012-02-02 LAB — KAPPA/LAMBDA LIGHT CHAINS
Kappa free light chain: 1.36 mg/dL (ref 0.33–1.94)
Lambda Free Lght Chn: 1.18 mg/dL (ref 0.57–2.63)

## 2012-02-04 ENCOUNTER — Telehealth: Payer: Self-pay | Admitting: Oncology

## 2012-02-04 ENCOUNTER — Ambulatory Visit (HOSPITAL_BASED_OUTPATIENT_CLINIC_OR_DEPARTMENT_OTHER): Payer: Medicare Other | Admitting: Oncology

## 2012-02-04 VITALS — BP 147/58 | HR 61 | Temp 96.8°F | Ht 64.5 in | Wt 132.6 lb

## 2012-02-04 DIAGNOSIS — D0590 Unspecified type of carcinoma in situ of unspecified breast: Secondary | ICD-10-CM

## 2012-02-04 DIAGNOSIS — C9000 Multiple myeloma not having achieved remission: Secondary | ICD-10-CM

## 2012-02-04 NOTE — Patient Instructions (Signed)
1. History of myeloma; off of Revlimid chemo for a while.  Now, M-spike starts to accumulate.  You still do not have anemia, renal failure, high calcium.  This is best classified as smoldering myeloma, not active.   2.  Recommendation:  Repeat bone marrow biopsy.  If plasma cell >10%, we may consider resuming Revlimid chemo.  If plasma cell <10%, I would recommend watchful observation.

## 2012-02-04 NOTE — Progress Notes (Signed)
[facility]  Telephone:([phone] [phone]   OFFICE PROGRESS NOTE   Cc:  Thayer Headings, MD  DIAGNOSIS AND PAST THERAPY:  1. 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 She finished adjuvant radiation therapy May 25, 2008. She was on adjuvant tamoxifen from November 2009 to June 2010, which was discontinued due to intolerance of hot flash/night sweat and new diagnosis of myeloma.  2. 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 She presented with anemia.  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   CURRENT THERAPY:  1. For history of DCIS, she is on watchful observation.  2. For multiple myeloma, she was started on Revlimid/melphalan/dexamethasone. She had grade 3 pancytopenia. Therefore since the second cycle she has been receiving Revlimid day 1, through 21 every 28 day and dexamethasone.   She stopped Revlimid in 02/2011 due to persistent neuropathy.  She has been on watchful observation for myeloma.    INTERVAL HISTORY: Natasha Jackson 78 y.o. female returns for regular follow up with her husband.  She still has moderate bilateral feet neuropathy.  She cannot walk barefeet.  It wakes her up at night.  She does not have any neuropathy in her hands.  Otherwise, she feels well.  She performs self-breast exam on a regular basis and has not had any problem.  She denied SOB, CP, DOE, bone pain.   Patient denies fever, anorexia, weight loss, fatigue, headache, visual changes, confusion, drenching night  sweats, palpable lymph node swelling, mucositis, odynophagia, dysphagia, nausea vomiting, jaundice, chest pain, palpitation, shortness of breath, dyspnea on exertion, productive cough, gum bleeding, epistaxis, hematemesis, hemoptysis, abdominal pain, abdominal swelling, early satiety, melena, hematochezia, hematuria, skin rash, spontaneous bleeding, joint swelling, joint pain, heat or cold intolerance, bowel bladder incontinence, back pain, focal motor weakness, depression.   Past Medical History  Diagnosis Date  . Breast cancer   . Multiple myeloma   . Aortic regurgitation   . HTN (hypertension)   . Hot flash not due to menopause 12/23/2011  . Neuropathy 12/23/2011    Past Surgical History  Procedure Date  . Lymph node excision - left groin   . Incisional hernia repair     Current Outpatient Prescriptions  Medication Sig Dispense Refill  . aspirin 325 MG EC tablet Take 325 mg by mouth daily.      [name] gabapentin (NEURONTIN) 100 MG capsule Take 100 mg by mouth at bedtime. May increase to twice daily if tolerates      . levothyroxine (SYNTHROID, LEVOTHROID) 50 MCG tablet Take 50 mcg by mouth daily.      [name] losartan (COZAAR) 25 MG tablet Take 1 tablet (25 mg total) by mouth daily.  90 tablet  3  . meloxicam (MOBIC) 7.5 MG tablet Take 7.5 mg by mouth 2 (two) times daily as needed.       [name] VITAMIN D-VITAMIN K PO Take 1 tablet by mouth daily.          ALLERGIES:   has no known allergies.  REVIEW OF SYSTEMS:  The rest of the 14-point review of system was negative.   Filed Vitals:   02/04/12 1331  BP: 147/58  Pulse: 61  Temp: 96.8 F (36 C)   Wt Readings from Last 3 Encounters:  02/04/12 132 lb 9.6 oz (60.147 kg)  01/05/12 132 lb (59.875 kg)  09/10/11 133 lb 14.4 oz (60.737 kg)   ECOG Performance status: 1  PHYSICAL EXAMINATION:   General: thin-appearing woman, in no acute distress.  Eyes:  no scleral icterus.  ENT:  There were no oropharyngeal lesions.  Neck was without  thyromegaly.  Lymphatics:  Negative cervical, supraclavicular or axillary adenopathy.  Respiratory: lungs were clear bilaterally without wheezing or crackles.  Cardiovascular:  Regular rate and rhythm, S1/S2, without murmur, rub or gallop.  There was no pedal edema.  GI:  abdomen was soft, flat, nontender, nondistended, without organomegaly.  Muscoloskeletal:  no spinal tenderness of palpation of vertebral spine.  Skin exam was without echymosis, petichae.  Neuro exam was nonfocal.  Patient was able to get on and off exam table without assistance.  Gait was normal.  Patient was alerted and oriented.  Attention was good.   Language was appropriate.  Mood was normal without depression.  Speech was not pressured.  Thought content was not tangential.     LABORATORY/RADIOLOGY DATA:  Lab Results  Component Value Date   WBC 4.2 01/29/2012   HGB 11.5* 01/29/2012   HCT 34.4* 01/29/2012   PLT 174 01/29/2012   GLUCOSE 81 01/29/2012   ALKPHOS 68 01/29/2012   ALT 16 01/29/2012   AST 25 01/29/2012   NA 137 01/29/2012   K 4.5 01/29/2012   CL 102 01/29/2012   CREATININE 0.76 01/29/2012   BUN 20 01/29/2012   CO2 29 01/29/2012    ASSESSMENT AND PLAN:   1. History of multiple myeloma:  Her M spike is again positive. However this is very low. This is most likely due to smoldering myeloma or possible MGUS or possible spurious positive M spike. She does not have anemia, renal insufficiency, or hypercalcemia to say this is active multiple myeloma. I recommended two options. The first option is aggressive which is proceeding with diagnostic bone marrow biopsy at this time. If the bone a biopsy shows more than 10% of cells and she can resume myeloma therapy. On the 10th of the bone biopsy now she doesn't have percent that we should recommend watchful observation. The second option is conservative which is repeating M spike in about 2 months. She would like to wait for repeat lab test about 2 months. We talked about potential  option for therapy if she were to resume myeloma therapy. The first option is to resume Revlimid however she does want to do this because of possible worsening of her neuropathy. The second option is subcutaneous Velcade which is very good chance of response however it is a slight chance of neuropathy as well. In addition, Velcade would require weekly injection in the clinic.  The last option is pamolidomide was approved by the FDA for patients who have recurrent multiple myeloma which may have less chance of neuropathy. She like this option the breast. 2. Hot flash and night sweats secondary to history of menopause: She does not want any med for this. We discussed again the risk of hormonal replacement therapy and increased risk of recurrent breast cancer. She does not want to resume hormonal replacement therapy.  3. Neuropathy: On gabapentin per PCP. 4. Hypothyroidism:  She is on synthroid per  PCP.  5. History of ductal carcinoma in situ: On observation. Her next mammogram is due in Feb 2014.  Her last mammogram in 08/2011 was negative.  She declined any more therapy because of severe hot flash and night sweat.

## 2012-02-04 NOTE — Telephone Encounter (Signed)
appts made and printed for pt,pt req to see dr ha!

## 2012-03-25 ENCOUNTER — Telehealth: Payer: Self-pay | Admitting: *Deleted

## 2012-03-25 NOTE — Telephone Encounter (Signed)
Pt called to ask if ok w/ Dr. Gaylyn Rong for her to take tylenol pm to help her sleep.  Informed pt ok to take any over the counter sleep aids as directed on package per Dr. Gaylyn Rong.  Will not interfere w/ her multiple myeloma.   Instructed if insomnia continues to be a problem to consult w/ her PCP.  She verbalized understanding.

## 2012-05-02 ENCOUNTER — Other Ambulatory Visit (HOSPITAL_BASED_OUTPATIENT_CLINIC_OR_DEPARTMENT_OTHER): Payer: Medicare Other | Admitting: Lab

## 2012-05-02 DIAGNOSIS — C9000 Multiple myeloma not having achieved remission: Secondary | ICD-10-CM

## 2012-05-02 LAB — CBC WITH DIFFERENTIAL/PLATELET
Eosinophils Absolute: 0 10*3/uL (ref 0.0–0.5)
HCT: 35.5 % (ref 34.8–46.6)
LYMPH%: 20.6 % (ref 14.0–49.7)
MCV: 102 fL — ABNORMAL HIGH (ref 79.5–101.0)
MONO#: 0.3 10*3/uL (ref 0.1–0.9)
MONO%: 6.5 % (ref 0.0–14.0)
NEUT#: 3.1 10*3/uL (ref 1.5–6.5)
NEUT%: 71.7 % (ref 38.4–76.8)
Platelets: 192 10*3/uL (ref 145–400)
RBC: 3.48 10*6/uL — ABNORMAL LOW (ref 3.70–5.45)

## 2012-05-02 LAB — COMPREHENSIVE METABOLIC PANEL (CC13)
AST: 24 U/L (ref 5–34)
Albumin: 3.7 g/dL (ref 3.5–5.0)
BUN: 23 mg/dL (ref 7.0–26.0)
CO2: 24 mEq/L (ref 22–29)
Calcium: 9.2 mg/dL (ref 8.4–10.4)
Chloride: 102 mEq/L (ref 98–107)
Creatinine: 1 mg/dL (ref 0.6–1.1)
Glucose: 93 mg/dl (ref 70–99)
Potassium: 4.3 mEq/L (ref 3.5–5.1)

## 2012-05-03 NOTE — Patient Instructions (Addendum)
1.  Diagnosis: history of multiple myeloma. 2.  Impression:  No evidence of active disease at this time.  There is no anemia, renal problem, or high calcium.  Mspike and serum light chain from 05/02/12 have slightly increased.  However, again, no end organ damage to suggest active myeloma.  Therefore, the best diagnosis would be smoldering myeloma (as opposed to active myeloma) 3.  Recommendation:  Follow up in about 3 months with myeloma lab again.

## 2012-05-04 ENCOUNTER — Ambulatory Visit (HOSPITAL_BASED_OUTPATIENT_CLINIC_OR_DEPARTMENT_OTHER): Payer: Medicare Other | Admitting: Oncology

## 2012-05-04 ENCOUNTER — Telehealth: Payer: Self-pay | Admitting: Oncology

## 2012-05-04 VITALS — BP 140/62 | HR 60 | Temp 97.3°F | Resp 18 | Ht 64.5 in | Wt 134.8 lb

## 2012-05-04 DIAGNOSIS — Z23 Encounter for immunization: Secondary | ICD-10-CM

## 2012-05-04 DIAGNOSIS — R079 Chest pain, unspecified: Secondary | ICD-10-CM

## 2012-05-04 DIAGNOSIS — D0510 Intraductal carcinoma in situ of unspecified breast: Secondary | ICD-10-CM

## 2012-05-04 DIAGNOSIS — C9000 Multiple myeloma not having achieved remission: Secondary | ICD-10-CM

## 2012-05-04 DIAGNOSIS — D0590 Unspecified type of carcinoma in situ of unspecified breast: Secondary | ICD-10-CM

## 2012-05-04 DIAGNOSIS — R0781 Pleurodynia: Secondary | ICD-10-CM

## 2012-05-04 LAB — PROTEIN ELECTROPHORESIS, SERUM
Alpha-2-Globulin: 11 % (ref 7.1–11.8)
Beta 2: 4 % (ref 3.2–6.5)
Beta Globulin: 5.8 % (ref 4.7–7.2)
Gamma Globulin: 12.3 % (ref 11.1–18.8)
M-Spike, %: 0.42 g/dL
Total Protein, Serum Electrophoresis: 6.3 g/dL (ref 6.0–8.3)

## 2012-05-04 MED ORDER — INFLUENZA VIRUS VACC SPLIT PF IM SUSP
0.5000 mL | INTRAMUSCULAR | Status: AC
  Administered 2012-05-04: 0.5 mL via INTRAMUSCULAR
  Filled 2012-05-04: qty 0.5

## 2012-05-04 NOTE — Telephone Encounter (Signed)
appts made and printed for pt aom °

## 2012-05-04 NOTE — Progress Notes (Signed)
[facility]  Telephone:([phone] Fax:([phone]   OFFICE PROGRESS NOTE   Cc:  Thayer Headings, MD  DIAGNOSIS AND PAST THERAPY:  1. 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 She finished adjuvant radiation therapy May 25, 2008. She was on adjuvant tamoxifen from November 2009 to June 2010, which was discontinued due to intolerance of hot flash/night sweat and new diagnosis of myeloma.  2. 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 She presented with anemia. 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   CURRENT THERAPY:  1. For history of DCIS, she is on watchful observation.  2. For multiple myeloma, she was started on Revlimid/melphalan/dexamethasone. She had grade 3 pancytopenia. Therefore since the second cycle she has been receiving Revlimid day 1, through 21 every 28 day and dexamethasone. She stopped Revlimid in 02/2011 due to persistent neuropathy. She has been on watchful observation for myeloma.   INTERVAL HISTORY: Natasha Jackson 78 y.o. female returns for regular follow up with her husband.  She reports feeling relatively well.  She still has lower extremity neuropathy, mild; worse with cold weather, improved with massage or warmth, not interfere with her sleep or walking.  She has mild left upper posterior rib discomfort.  Pain is mild, crampy, not clear inciting/alleviating factors. It does not interfere with her breathing, activities.  She has intermittent discomfort in the right breast lumpectomy  scar.   Patient denies fever, anorexia, weight loss, fatigue, headache, visual changes, confusion, drenching night sweats, palpable lymph node swelling, mucositis, odynophagia, dysphagia, nausea vomiting, jaundice, chest pain, palpitation, shortness of breath, dyspnea on exertion, productive cough, gum bleeding, epistaxis, hematemesis, hemoptysis, abdominal pain, abdominal swelling, early satiety, melena, hematochezia, hematuria, skin rash, spontaneous bleeding, joint swelling, heat or cold intolerance, bowel bladder incontinence, focal motor weakness, depression.    Past Medical History  Diagnosis Date  . Breast cancer   . Multiple myeloma   . Aortic regurgitation   . HTN (hypertension)   . Hot flash not due to menopause 12/23/2011  . Neuropathy 12/23/2011    Past Surgical History  Procedure Date  . Lymph node excision - left groin   . Incisional hernia repair     Current Outpatient Prescriptions  Medication Sig Dispense Refill  . aspirin 325 MG EC tablet Take 325 mg by mouth daily.      [name] gabapentin (NEURONTIN) 100 MG capsule Take 200 mg by mouth at bedtime. May increase to twice daily if tolerates      . levothyroxine (SYNTHROID, LEVOTHROID) 50 MCG tablet Take 50 mcg by mouth daily.      [name] losartan (COZAAR) 25 MG tablet Take 1 tablet (25 mg total) by mouth daily.  90 tablet  3  . meloxicam (MOBIC) 7.5 MG tablet Take 7.5 mg by mouth 2 (two) times daily as needed.       [name] VITAMIN D-VITAMIN K PO Take 1 tablet by mouth daily. With Calcium 500 mg       Current Facility-Administered Medications  Medication Dose Route Frequency Provider  Last Rate Last Dose  . influenza  inactive virus vaccine (FLUZONE/FLUARIX) injection 0.5 mL  0.5 mL Intramuscular Tomorrow-1000 Exie Parody, MD   0.5 mL at 05/04/12 1401    ALLERGIES:   has no known allergies.  REVIEW OF SYSTEMS:  The rest of the 14-point review of system was negative.   Filed Vitals:   05/04/12 1330  BP: 140/62  Pulse: 60  Temp:  97.3 F (36.3 C)  Resp: 18   Wt Readings from Last 3 Encounters:  05/04/12 134 lb 12.8 oz (61.145 kg)  02/04/12 132 lb 9.6 oz (60.147 kg)  01/05/12 132 lb (59.875 kg)   ECOG Performance status: 0  PHYSICAL EXAMINATION:   General:  Thin-appearing woman, in no acute distress.  Eyes:  no scleral icterus.  ENT:  There were no oropharyngeal lesions.  Neck was without thyromegaly.  Lymphatics:  Negative cervical, supraclavicular or axillary adenopathy.  Respiratory: lungs were clear bilaterally without wheezing or crackles.  Cardiovascular:  Regular rate and rhythm, S1/S2, without murmur, rub or gallop.  There was no pedal edema.  GI:  abdomen was soft, flat, nontender, nondistended, without organomegaly.  Muscoloskeletal:  no spinal tenderness of palpation of vertebral spine.  There was mild discomfort to palpation of left upper back by the scapula without erythema, skin rash, palpable mass.  Skin exam was without echymosis, petichae.  Neuro exam was nonfocal.  Patient was able to get on and off exam table without assistance.  Gait was normal.  Patient was alerted and oriented.  Attention was good.   Language was appropriate.  Mood was normal without depression.  Speech was not pressured.  Thought content was not tangential.  Bilateral breast exam was negative for any palpable mass, nipple discharge, nipple inversion, skin thickening. There was normal right breast lumpectomy scar. There was pain on breast exam.    LABORATORY/RADIOLOGY DATA:  Lab Results  Component Value Date   WBC 4.3 05/02/2012   HGB 12.2 05/02/2012   HCT 35.5 05/02/2012   PLT 192 05/02/2012   GLUCOSE 93 05/02/2012   ALKPHOS 67 05/02/2012   ALT 19 05/02/2012   AST 24 05/02/2012   NA 135* 05/02/2012   K 4.3 05/02/2012   CL 102 05/02/2012   CREATININE 1.0 05/02/2012   BUN 23.0 05/02/2012   CO2 24 05/02/2012    ASSESSMENT AND PLAN:   1. History of multiple myeloma: Her M spike slightly increased again but her light  chain were normal.  She does not have anemia, renal failure, hypercalcemia.  I requested skeletal survey to see if there are lytic lesion to warrant treatment.  If there is no new lytic lesion, I will recommend watchful observation.  Patient was OK with this plan.  2. Rib pain: plain film to rule out lytic lesion.  If there is severe lytic lesion, we may consider irradiation.  3. Neuropathy: On gabapentin per PCP. 4. Hypothyroidism: She is on synthroid per PCP.  5. History of ductal carcinoma in situ: On observation. Her next mammogram is due in Feb 2014 which I ordered today.  6. Follow up:  In about 3 months unless skeletal Xray is abnormal.  7. Primary care:  She expressed informed understanding and received a flu shot today.     The length of time of the face-to-face encounter was 20 minutes. More than 50% of time was spent counseling and coordination of care.

## 2012-05-10 ENCOUNTER — Ambulatory Visit (HOSPITAL_COMMUNITY)
Admission: RE | Admit: 2012-05-10 | Discharge: 2012-05-10 | Disposition: A | Discharge status: Home / Self Care | Payer: Medicare Other | Source: Ambulatory Visit | Attending: Oncology | Admitting: Oncology

## 2012-05-10 DIAGNOSIS — R079 Chest pain, unspecified: Secondary | ICD-10-CM | POA: Insufficient documentation

## 2012-05-10 DIAGNOSIS — R0781 Pleurodynia: Secondary | ICD-10-CM

## 2012-05-10 DIAGNOSIS — Z23 Encounter for immunization: Secondary | ICD-10-CM

## 2012-05-10 DIAGNOSIS — C9000 Multiple myeloma not having achieved remission: Secondary | ICD-10-CM | POA: Insufficient documentation

## 2012-05-11 ENCOUNTER — Encounter: Payer: Self-pay | Admitting: Oncology

## 2012-05-19 ENCOUNTER — Telehealth: Payer: Self-pay | Admitting: *Deleted

## 2012-05-19 NOTE — Telephone Encounter (Signed)
That’s fine with me

## 2012-05-19 NOTE — Telephone Encounter (Signed)
Called pt back and informed ok w/ Dr. Gaylyn Rong to get the Tdap vaccine.  She verbalized understanding.

## 2012-05-19 NOTE — Telephone Encounter (Signed)
Pt left VM asking if ok to get the whooping cough or Tdap vaccine?   States it was recommended so she can be around newborn great grand baby due in January.  States would need the vaccine before Christmas if ok w/ Dr. Gaylyn Rong.

## 2012-07-28 ENCOUNTER — Other Ambulatory Visit (HOSPITAL_BASED_OUTPATIENT_CLINIC_OR_DEPARTMENT_OTHER): Payer: Medicare Other | Admitting: Lab

## 2012-07-28 DIAGNOSIS — Z23 Encounter for immunization: Secondary | ICD-10-CM

## 2012-07-28 DIAGNOSIS — C9000 Multiple myeloma not having achieved remission: Secondary | ICD-10-CM

## 2012-07-28 DIAGNOSIS — R0781 Pleurodynia: Secondary | ICD-10-CM

## 2012-07-28 LAB — CBC WITH DIFFERENTIAL/PLATELET
BASO%: 0.6 % (ref 0.0–2.0)
EOS%: 0.7 % (ref 0.0–7.0)
HCT: 36.9 % (ref 34.8–46.6)
MCH: 34 pg (ref 25.1–34.0)
MCHC: 33.9 g/dL (ref 31.5–36.0)
MONO#: 0.2 10*3/uL (ref 0.1–0.9)
NEUT%: 72.6 % (ref 38.4–76.8)
RBC: 3.68 10*6/uL — ABNORMAL LOW (ref 3.70–5.45)
RDW: 14.5 % (ref 11.2–14.5)
WBC: 4.5 10*3/uL (ref 3.9–10.3)
lymph#: 1 10*3/uL (ref 0.9–3.3)

## 2012-07-28 LAB — COMPREHENSIVE METABOLIC PANEL (CC13)
AST: 26 U/L (ref 5–34)
Albumin: 3.6 g/dL (ref 3.5–5.0)
Alkaline Phosphatase: 70 U/L (ref 40–150)
Potassium: 4.1 mEq/L (ref 3.5–5.1)
Sodium: 137 mEq/L (ref 136–145)
Total Bilirubin: 0.46 mg/dL (ref 0.20–1.20)
Total Protein: 7.1 g/dL (ref 6.4–8.3)

## 2012-08-01 LAB — PROTEIN ELECTROPHORESIS, SERUM
Albumin ELP: 60.2 % (ref 55.8–66.1)
Alpha-1-Globulin: 3.8 % (ref 2.9–4.9)
Alpha-2-Globulin: 11.6 % (ref 7.1–11.8)
Total Protein, Serum Electrophoresis: 6.8 g/dL (ref 6.0–8.3)

## 2012-08-01 LAB — KAPPA/LAMBDA LIGHT CHAINS
Kappa free light chain: 2.21 mg/dL — ABNORMAL HIGH (ref 0.33–1.94)
Lambda Free Lght Chn: 1.44 mg/dL (ref 0.57–2.63)

## 2012-08-04 ENCOUNTER — Telehealth: Payer: Self-pay | Admitting: Oncology

## 2012-08-04 ENCOUNTER — Ambulatory Visit (HOSPITAL_BASED_OUTPATIENT_CLINIC_OR_DEPARTMENT_OTHER): Payer: Medicare Other | Admitting: Oncology

## 2012-08-04 ENCOUNTER — Other Ambulatory Visit: Payer: Self-pay | Admitting: *Deleted

## 2012-08-04 ENCOUNTER — Encounter: Payer: Self-pay | Admitting: Oncology

## 2012-08-04 VITALS — BP 146/66 | HR 60 | Temp 96.7°F | Resp 18 | Ht 64.5 in | Wt 135.8 lb

## 2012-08-04 DIAGNOSIS — G589 Mononeuropathy, unspecified: Secondary | ICD-10-CM

## 2012-08-04 DIAGNOSIS — D0590 Unspecified type of carcinoma in situ of unspecified breast: Secondary | ICD-10-CM

## 2012-08-04 DIAGNOSIS — D0510 Intraductal carcinoma in situ of unspecified breast: Secondary | ICD-10-CM

## 2012-08-04 DIAGNOSIS — C9000 Multiple myeloma not having achieved remission: Secondary | ICD-10-CM

## 2012-08-04 HISTORY — DX: Intraductal carcinoma in situ of unspecified breast: D05.10

## 2012-08-04 MED ORDER — DEXAMETHASONE 4 MG PO TABS: 20.0000 mg | ORAL_TABLET | ORAL | Status: DC

## 2012-08-04 MED ORDER — LENALIDOMIDE 10 MG PO CAPS: 10.0000 mg | ORAL_CAPSULE | Freq: Every day | ORAL | Status: DC

## 2012-08-04 MED ORDER — GABAPENTIN 100 MG PO CAPS: 200.0000 mg | ORAL_CAPSULE | Freq: Two times a day (BID) | ORAL | Status: DC

## 2012-08-04 NOTE — Telephone Encounter (Signed)
appts made and printed for  Pt aom °

## 2012-08-04 NOTE — Telephone Encounter (Signed)
Pt re-enrolled in [employer] Revlimid program.  Berkley Harvey #[id number].  Rx and Software engineer forwarded to Thelma Barge in [facility] Dept.

## 2012-08-04 NOTE — Progress Notes (Signed)
[facility]  [phone] Fax:(336) [phone]   OFFICE PROGRESS NOTE   Cc:  Thayer Headings, MD  DIAGNOSIS AND PAST THERAPY:  1. 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 She finished adjuvant radiation therapy May 25, 2008. She was on adjuvant tamoxifen from November 2009 to June 2010, which was discontinued due to intolerance of hot flash/night sweat and new diagnosis of myeloma.  2. 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 She presented with anemia. 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   CURRENT THERAPY:  1. For history of DCIS, she is on watchful observation.  2. For multiple myeloma, she was started on Revlimid/melphalan/dexamethasone. She had grade 3 pancytopenia. Therefore since the second cycle she has been receiving Revlimid day 1, through 21 every 28 day and dexamethasone. She stopped Revlimid in 02/2011 due to persistent neuropathy. She has been on watchful observation for myeloma.   INTERVAL HISTORY: Natasha Jackson 79 y.o. female returns for regular follow up with her husband.  She reports feeling relatively well.  She still has lower extremity neuropathy; thinks it has worsened since her last visit. It not interfere with her sleep or walking.  On Neurontin 200 mg at bedtime. She has asked if she can take 200 mg BID. Denies pain. She has intermittent discomfort in the right breast lumpectomy scar.   Patient denies fever, anorexia, weight loss, fatigue, headache, visual changes,  confusion, drenching night sweats, palpable lymph node swelling, mucositis, odynophagia, dysphagia, nausea vomiting, jaundice, chest pain, palpitation, shortness of breath, dyspnea on exertion, productive cough, gum bleeding, epistaxis, hematemesis, hemoptysis, abdominal pain, abdominal swelling, early satiety, melena, hematochezia, hematuria, skin rash, spontaneous bleeding, joint swelling, heat or cold intolerance, bowel bladder incontinence, focal motor weakness, depression.    Past Medical History  Diagnosis Date  . Breast cancer   . Multiple myeloma(203.0)   . Aortic regurgitation   . HTN (hypertension)   . Hot flash not due to menopause 12/23/2011  . Neuropathy 12/23/2011  . DCIS (ductal carcinoma in situ) 08/04/2012    Past Surgical History  Procedure Date  . Lymph node excision - left groin   . Incisional hernia repair     Current Outpatient Prescriptions  Medication Sig Dispense Refill  . aspirin 325 MG EC tablet Take 325 mg by mouth daily.      [name] dexamethasone (DECADRON) 4 MG tablet Take 5 tablets (20 mg total) by mouth once a week.  60 tablet  1  . gabapentin (NEURONTIN) 100 MG capsule Take 2 capsules (200 mg total) by mouth 2 (two) times daily.  120 capsule  3  . lenalidomide (REVLIMID) 10 MG capsule Take 1 capsule (10 mg total) by mouth daily. Take for 21 days then take 7 days off.  21 capsule  0  . levothyroxine (SYNTHROID, LEVOTHROID) 50 MCG tablet Take 50 mcg by mouth daily.      [name] losartan (COZAAR) 25 MG tablet Take 1 tablet (25 mg total) by mouth daily.  90 tablet  3  . meloxicam (  MOBIC) 7.5 MG tablet Take 7.5 mg by mouth 2 (two) times daily as needed.       [name] VITAMIN D-VITAMIN K PO Take 1 tablet by mouth daily. With Calcium 500 mg        ALLERGIES:   has no known allergies.  REVIEW OF SYSTEMS:  The rest of the 14-point review of system was negative.   Filed Vitals:   08/04/12 1352  BP: 146/66  Pulse: 60  Temp: 96.7 F (35.9 C)  Resp: 18   Wt Readings from  Last 3 Encounters:  08/04/12 135 lb 12.8 oz (61.598 kg)  05/04/12 134 lb 12.8 oz (61.145 kg)  02/04/12 132 lb 9.6 oz (60.147 kg)   ECOG Performance status: 0  PHYSICAL EXAMINATION:   General:  Thin-appearing woman, in no acute distress.  Eyes:  no scleral icterus.  ENT:  There were no oropharyngeal lesions.  Neck was without thyromegaly.  Lymphatics:  Negative cervical, supraclavicular or axillary adenopathy.  Respiratory: lungs were clear bilaterally without wheezing or crackles.  Cardiovascular:  Regular rate and rhythm, S1/S2, without murmur, rub or gallop.  There was no pedal edema.  GI:  abdomen was soft, flat, nontender, nondistended, without organomegaly.  Muscoloskeletal:  no spinal tenderness of palpation of vertebral spine.  There was mild discomfort to palpation of left upper back by the scapula without erythema, skin rash, palpable mass.  Skin exam was without echymosis, petichae.  Neuro exam was nonfocal.  Patient was able to get on and off exam table without assistance.  Gait was normal.  Patient was alerted and oriented.  Attention was good.   Language was appropriate.  Mood was normal without depression.  Speech was not pressured.  Thought content was not tangential.  Bilateral breast exam was negative for any palpable mass, nipple discharge, nipple inversion, skin thickening. There was normal right breast lumpectomy scar. There was pain on breast exam.    LABORATORY/RADIOLOGY DATA:  Lab Results  Component Value Date   WBC 4.5 07/28/2012   HGB 12.5 07/28/2012   HCT 36.9 07/28/2012   PLT 210 07/28/2012   GLUCOSE 116* 07/28/2012   ALKPHOS 70 07/28/2012   ALT 20 07/28/2012   AST 26 07/28/2012   NA 137 07/28/2012   K 4.1 07/28/2012   CL 101 07/28/2012   CREATININE 0.9 07/28/2012   BUN 21.0 07/28/2012   CO2 27 07/28/2012    ASSESSMENT AND PLAN:   1. History of multiple myeloma: Her M spike slightly increased again and light chains rising. She does not have anemia, renal failure,  hypercalcemia.  Discussed options with her today including watchful observation versus Revlimid 10 mg daily; 3 weeks on and 1 week off with Dexamethasone 10 mg weekly. Patient expresses some concern about side effects, but ultimately decided to pursue treatment with Revlimid and Dexamethasone. Consent signed. Rx given to managed care  To help with co-assistance.  2. Neuropathy: Worsening due to her report today. I have written a new prescription for Neurontin 200 mg BID per her request. 3. Hypothyroidism: She is on synthroid per PCP.  4. History of ductal carcinoma in situ: On observation. Her next mammogram is scheduled for Feb 2014.  5. Follow up:  6 weeks.      The length of time of the face-to-face encounter was 30 minutes. More than 50% of time was spent counseling and coordination of care.

## 2012-08-09 ENCOUNTER — Other Ambulatory Visit: Payer: Self-pay

## 2012-08-09 DIAGNOSIS — C9000 Multiple myeloma not having achieved remission: Secondary | ICD-10-CM

## 2012-08-09 MED ORDER — LENALIDOMIDE 10 MG PO CAPS: 10.0000 mg | ORAL_CAPSULE | Freq: Every day | ORAL | Status: DC

## 2012-08-09 NOTE — Telephone Encounter (Signed)
Faxed Rx for Revlimid to [facility] [phone] / [phone]-office); Revlimid scheduled to be delivered to patient within 24 hours after receiving Rx; patient aware.

## 2012-08-11 ENCOUNTER — Encounter: Payer: Self-pay | Admitting: Oncology

## 2012-08-11 ENCOUNTER — Telehealth: Payer: Self-pay | Admitting: *Deleted

## 2012-08-11 NOTE — Telephone Encounter (Signed)
Pt asks if Dr. Gaylyn Rong has opinion on her continuing to take losartan?  Asks if this is hard on her kidneys?   Her Revlimid has not arrived yet.  I spoke w/ [facility] and they have processed her Rx but will contact pt regarding her co payment. Pt may need assistance w/ her co pay and they will help pt apply if needed.  They will arrange for delivery as soon as possible.  Pt wants to know if Dr. Gaylyn Rong wanted to see her 6 weeks after last appt or 6 weeks after she starts Revlimid? Asks if next appt will need to be pushed back due to not getting the revlimid yet?

## 2012-08-11 NOTE — Telephone Encounter (Signed)
I do not have opinion re: Losartan.  She should ask her PCP re: this mediation risk and benefit.  However, there is not problem of taking Losartan and Revlimid together.  We monitor her kidney function on a regular basis.  Thanks.

## 2012-08-11 NOTE — Progress Notes (Signed)
Faxed Revlimid Rx to Ellis Savage at [facility] [phone] Fax and her Phone number is  [phone] ext [phone].

## 2012-08-12 NOTE — Telephone Encounter (Signed)
Informed pt of Dr. Lodema Pilot message below regarding Losartan.  Informed her [employer] [phone]) should be calling her soon to discuss Revlimid and help her to enroll for co pay assistance if needed.  Instructed her to call us when she actually starts the Revlimid or needs any help in getting it.  Informed we will ask Dr. Gaylyn Rong if her f/u visit needs to be moved once we actually know she is starting the revlimid.  She verbalized understanding.

## 2012-08-17 ENCOUNTER — Telehealth: Payer: Self-pay | Admitting: *Deleted

## 2012-08-17 ENCOUNTER — Encounter: Payer: Self-pay | Admitting: *Deleted

## 2012-08-17 NOTE — Progress Notes (Signed)
Received fax from [employer] ph# [phone],  Fax# [phone].  Pt has been enrolled in [employer] program.  Pt's co pay is $25 for Revlimid now for duration of 2014 to max benefit of $10,000.

## 2012-08-17 NOTE — Telephone Encounter (Signed)
Instructed pt to see PCP regarding her cold and sore throat.  Instructed to delay starting Revlimid for 10 days,  To start on 08/27/12 but call us if she is still stick.  Informed office visit will be delayed by a week and scheduling will call her w/ new date/time.  She verbalized understanding.

## 2012-08-17 NOTE — Telephone Encounter (Signed)
She can delay starting Revlimid for about 10 days.   Please send a POF to delay her visit accordingly.  THanks.

## 2012-08-17 NOTE — Telephone Encounter (Signed)
Pt will received Revlimid in mail tomorrow.  She asks if she should start it yet because she has a cold w/ coughing, congestion and sore throat x 3 days now?  Pt denies fever.  Also wants to know how long after she starts Revlimid Dr. Gaylyn Rong wants to see her?  At this point she is scheduled to return in 4 weeks.

## 2012-08-18 ENCOUNTER — Telehealth: Payer: Self-pay | Admitting: Oncology

## 2012-08-18 NOTE — Telephone Encounter (Signed)
s.w. pt and advised on 3.19.14 appt....pt ok and aware

## 2012-08-25 ENCOUNTER — Encounter: Payer: Self-pay | Admitting: Oncology

## 2012-08-29 ENCOUNTER — Ambulatory Visit
Admission: RE | Admit: 2012-08-29 | Discharge: 2012-08-29 | Disposition: A | Discharge status: Home / Self Care | Payer: Medicare Other | Source: Ambulatory Visit | Attending: Oncology | Admitting: Oncology

## 2012-08-29 DIAGNOSIS — D0510 Intraductal carcinoma in situ of unspecified breast: Secondary | ICD-10-CM

## 2012-09-07 ENCOUNTER — Other Ambulatory Visit: Payer: Self-pay | Admitting: *Deleted

## 2012-09-07 DIAGNOSIS — C9000 Multiple myeloma not having achieved remission: Secondary | ICD-10-CM

## 2012-09-07 MED ORDER — LENALIDOMIDE 10 MG PO CAPS: 10.0000 mg | ORAL_CAPSULE | Freq: Every day | ORAL | Status: DC

## 2012-09-15 ENCOUNTER — Ambulatory Visit: Payer: Medicare Other | Admitting: Oncology

## 2012-09-15 ENCOUNTER — Other Ambulatory Visit: Payer: Medicare Other | Admitting: Lab

## 2012-09-21 ENCOUNTER — Other Ambulatory Visit: Payer: Self-pay

## 2012-09-21 ENCOUNTER — Telehealth: Payer: Self-pay | Admitting: Cardiology

## 2012-09-21 ENCOUNTER — Telehealth: Payer: Self-pay | Admitting: *Deleted

## 2012-09-21 ENCOUNTER — Other Ambulatory Visit (HOSPITAL_BASED_OUTPATIENT_CLINIC_OR_DEPARTMENT_OTHER): Payer: Medicare Other | Admitting: Lab

## 2012-09-21 ENCOUNTER — Ambulatory Visit (HOSPITAL_BASED_OUTPATIENT_CLINIC_OR_DEPARTMENT_OTHER): Payer: Medicare Other | Admitting: Oncology

## 2012-09-21 VITALS — BP 127/58 | HR 47 | Temp 97.2°F | Resp 18 | Ht 64.5 in | Wt 137.1 lb

## 2012-09-21 DIAGNOSIS — C9002 Multiple myeloma in relapse: Secondary | ICD-10-CM

## 2012-09-21 DIAGNOSIS — Z87898 Personal history of other specified conditions: Secondary | ICD-10-CM

## 2012-09-21 DIAGNOSIS — C9000 Multiple myeloma not having achieved remission: Secondary | ICD-10-CM

## 2012-09-21 DIAGNOSIS — R197 Diarrhea, unspecified: Secondary | ICD-10-CM

## 2012-09-21 DIAGNOSIS — I498 Other specified cardiac arrhythmias: Secondary | ICD-10-CM

## 2012-09-21 LAB — COMPREHENSIVE METABOLIC PANEL (CC13)
ALT: 24 U/L (ref 0–55)
AST: 22 U/L (ref 5–34)
Alkaline Phosphatase: 60 U/L (ref 40–150)
Creatinine: 0.8 mg/dL (ref 0.6–1.1)
Total Bilirubin: 0.45 mg/dL (ref 0.20–1.20)

## 2012-09-21 LAB — CBC WITH DIFFERENTIAL/PLATELET
BASO%: 0.9 % (ref 0.0–2.0)
EOS%: 2.7 % (ref 0.0–7.0)
HCT: 32.7 % — ABNORMAL LOW (ref 34.8–46.6)
LYMPH%: 18.3 % (ref 14.0–49.7)
MCH: 33.8 pg (ref 25.1–34.0)
MCHC: 34.1 g/dL (ref 31.5–36.0)
NEUT%: 69.8 % (ref 38.4–76.8)
Platelets: 194 10*3/uL (ref 145–400)
lymph#: 0.9 10*3/uL (ref 0.9–3.3)

## 2012-09-21 NOTE — Progress Notes (Signed)
[facility]  Telephone:([phone] Fax:([phone]) [phone]   OFFICE PROGRESS NOTE   Cc:  Charlton Haws, MD  DIAGNOSIS AND PAST THERAPY:  1. 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 She finished adjuvant radiation therapy May 25, 2008. She was on adjuvant tamoxifen from November 2009 to June 2010, which was discontinued due to intolerance of hot flash/night sweat and new diagnosis of myeloma.  2. 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 She presented with anemia. 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   CURRENT THERAPY:  1. For history of DCIS, she is on watchful observation.  2. For multiple myeloma, she was started on Revlimid/melphalan/dexamethasone. She had grade 3 pancytopenia. Therefore since the second cycle she has been receiving Revlimid day 1, through 21 every 28 day and dexamethasone. She stopped Revlimid in 02/2011 due to persistent neuropathy. She had persistent increase in Mspike and resumed on Revlimid and Dexamethasone on 09/03/2012.   INTERVAL HISTORY: Natasha Jackson 79 y.o. female returns for regular follow up with her husband.  She reports feeling relatively well since starting Rev/Dex.  She had prolonged sinusitis in Feb 2014 and was given a long course of Zpak with complete resolution of her sinus pain/discharge.  She has not had worsening of her lower extremity neuropathy with resumption of her Revlimid. She started having diarrhea about once a day since starting Revlimid.  She denied abdominal  pain, rectal bleeding, nausea/vomiting. She has been taking Imodium if she spends a lot of time outside her house.  She does not drink a lot of water.  She had one episode of dizziness today after lunch.  She did not have this type of symptom before.  She denied chest pain, SOB, headache, palpitation.       Past Medical History  Diagnosis Date  . Breast cancer   . Multiple myeloma(203.0)   . Aortic regurgitation   . HTN (hypertension)   . Hot flash not due to menopause 12/23/2011  . Neuropathy 12/23/2011  . DCIS (ductal carcinoma in situ) 08/04/2012    Past Surgical History  Procedure Laterality Date  . Lymph node excision - left groin    . Incisional hernia repair      Current Outpatient Prescriptions  Medication Sig Dispense Refill  . aspirin 325 MG EC tablet Take 325 mg by mouth daily.      [name] dexamethasone (DECADRON) 4 MG tablet Take 5 tablets (20 mg total) by mouth once a week.  60 tablet  1  . gabapentin (NEURONTIN) 100 MG capsule Take 2 capsules (200 mg total) by mouth 2 (two) times daily.  120 capsule  3  . lenalidomide (REVLIMID) 10 MG capsule Take 1 capsule (10 mg total) by mouth daily. Take for 21 days then take 7 days off.  21 capsule  0  . levothyroxine (SYNTHROID, LEVOTHROID) 50 MCG tablet Take 50 mcg by mouth daily.      [name] loperamide (IMODIUM A-D) 2 MG tablet Take 2 mg by mouth as needed for diarrhea or  loose stools (as directed.).      [name] losartan (COZAAR) 25 MG tablet Take 1 tablet (25 mg total) by mouth daily.  90 tablet  3  . meloxicam (MOBIC) 7.5 MG tablet Take 7.5 mg by mouth 2 (two) times daily as needed.       [name] VITAMIN D-VITAMIN K PO Take 1 tablet by mouth daily. With Calcium 500 mg       No current facility-administered medications for this visit.    ALLERGIES:  has No Known Allergies.  REVIEW OF SYSTEMS:  The rest of the 14-point review of system was negative.   Filed Vitals:   09/21/12 1410  BP: 127/58  Pulse: 47  Temp: 97.2 F (36.2 C)  Resp: 18    Wt Readings from Last 3 Encounters:  09/21/12 137 lb 1.6 oz (62.188 kg)  08/04/12 135 lb 12.8 oz (61.598 kg)  05/04/12 134 lb 12.8 oz (61.145 kg)   ECOG Performance status: 0  PHYSICAL EXAMINATION:   General:  Thin-appearing woman, in no acute distress.  Eyes:  no scleral icterus.  ENT:  There were no oropharyngeal lesions.  Neck was without thyromegaly.  Lymphatics:  Negative cervical, supraclavicular or axillary adenopathy.  Respiratory: lungs were clear bilaterally without wheezing or crackles.  Cardiovascular:  Regular rate and rhythm, S1/S2, without murmur, rub or gallop.  There was no pedal edema.  GI:  abdomen was soft, flat, nontender, nondistended, without organomegaly.  Muscoloskeletal:  no spinal tenderness of palpation of vertebral spine.  There was mild discomfort to palpation of left upper back by the scapula without erythema, skin rash, palpable mass.  Skin exam was without echymosis, petichae.  Neuro exam was nonfocal.  Patient was able to get on and off exam table without assistance.  Gait was normal.  Patient was alert and oriented.  Attention was good.   Language was appropriate.  Mood was normal without depression.  Speech was not pressured.  Thought content was not tangential.  Bilateral breast exam was negative for any palpable mass, nipple discharge, nipple inversion, skin thickening. There was normal right breast lumpectomy scar. There was pain on breast exam.    LABORATORY/RADIOLOGY DATA:  Lab Results  Component Value Date   WBC 5.0 09/21/2012   HGB 11.1* 09/21/2012   HCT 32.7* 09/21/2012   PLT 194 09/21/2012   GLUCOSE 90 09/21/2012   ALKPHOS 60 09/21/2012   ALT 24 09/21/2012   AST 22 09/21/2012   NA 137 09/21/2012   K 3.7 09/21/2012   CL 103 09/21/2012   CREATININE 0.8 09/21/2012   BUN 14.2 09/21/2012   CO2 26 09/21/2012    EKG from today showed sinus bradycardia at 45 bpm; normal PR/QRS interval.  There was left axis deviation and left bundle branch block.    ASSESSMENT AND PLAN:   1. Relapsed multiple myeloma: she restarted Revlimid and Dexamethasone 19 days ago.  Plan for 21 days on, 7 days off.  She has grade 2 diarrhea.  This is most likely due to Revlimid.  I advised her to continue current therapy.  Mspike from today is still pending to assess response.   2. Symptomatic bradycardia:  She has had sinus bradycardia and left bundle branch block since 2011 per EKG on EPIC.  However, she never had symptom or this level of bradycardia and relative hypotension.  I personally contacted her cardiologist's office (Dr. Eden Emms) and spoke with Dr. Freddy Finner who graciously worked patient in to see Dr. Daleen Squibb tomorrow.  I  advised her to go to ED tonight if she has syncope or chest pain or worsening dizziness.  I also advised her to drink plenty of fluid and ask Dr. Daleen Squibb to see if she should stop her Losartan.  Revlimid chemo is not known to cause bradycardia.  That risk is <1% in case series of patients on Revlimid.  3.  Neuropathy: On gabapentin per PCP. 4. Hypothyroidism: She is on synthroid per PCP.  5. History of ductal carcinoma in situ: On observation. Her next mammogram is due in Feb 2015; her last mammogram in 10/2012 was negative.  6. Diarrhea:  Most likely due to Revlimid. She does not have fever, abdominal pain, or severe diarrhea to suggest Cdif.  I advised her to continue Imodium prn. I recommended her to refrain from dairy products.   I advised her to contact us her her diarrhea worsens.   7. Follow up:  In 1 month before the 3rd cycle of chemo.      The length of time of the face-to-face encounter was 25 minutes. More than 50% of time was spent counseling and coordination of care.

## 2012-09-21 NOTE — Telephone Encounter (Signed)
Dr. Gaylyn Rong called regarding this patient this afternoon.  She is at the oncology clinic currently being treated today.  He would like to get her seen.  She has LBBB which is chronic and HR is now down in the 40-50 range with some dizziness intermittently.  He wanted Dr. Eden Emms to see the patient but he is not here until Monday.  Added to the DOD schedule for tomorrow.

## 2012-09-21 NOTE — Telephone Encounter (Signed)
Dr. Gaylyn Rong s/w Dr. Riley Kill regarding pt's bradycardia and c/o near syncope.Natasha Jackson  Appt made for pt at [facility] for tomorrow 3/20 at 1:45 pm to see Dr. Daleen Squibb.  Left pt VM informing of this appt and to please call us back to confirm she received this message.

## 2012-09-22 ENCOUNTER — Ambulatory Visit: Payer: Medicare Other | Admitting: Cardiology

## 2012-09-22 ENCOUNTER — Ambulatory Visit (INDEPENDENT_AMBULATORY_CARE_PROVIDER_SITE_OTHER): Payer: Medicare Other | Admitting: Cardiology

## 2012-09-22 ENCOUNTER — Encounter: Payer: Self-pay | Admitting: Cardiology

## 2012-09-22 VITALS — BP 127/59 | HR 53 | Wt 134.0 lb

## 2012-09-22 DIAGNOSIS — I498 Other specified cardiac arrhythmias: Secondary | ICD-10-CM

## 2012-09-22 DIAGNOSIS — R001 Bradycardia, unspecified: Secondary | ICD-10-CM | POA: Insufficient documentation

## 2012-09-22 NOTE — Patient Instructions (Addendum)
Your physician has recommended that you wear a 24 hour holter monitor. Holter monitors are medical devices that record the heart's electrical activity. Doctors most often use these monitors to diagnose arrhythmias. Arrhythmias are problems with the speed or rhythm of the heartbeat. The monitor is a small, portable device. You can wear one while you do your normal daily activities. This is usually used to diagnose what is causing palpitations/syncope (passing out).  Your physician recommends that you schedule a follow-up appointment in: 4-6 weeks.

## 2012-09-22 NOTE — Assessment & Plan Note (Signed)
Patient with recent dizziness for 10 seconds not clearly related to bradycardia. She does have a left bundle branch block but this is old when compared to previous electrocardiograms. I will place a 24-hour Holter monitor. Note she does have multiple myeloma and would be at risk for amyloidosis as a possible contributor to conduction disease. However she is being treated for her myeloma and I think this is less likely. Cardiac MRI can be pursued in the future if her conduction system disease worsens.

## 2012-09-22 NOTE — Assessment & Plan Note (Signed)
Moderate on most recent echocardiogram. Repeat study July 2014.

## 2012-09-22 NOTE — Progress Notes (Signed)
   HPI: pleasant female followed by Dr. Eden Emms for evaluation of bradycardia; has h/o aortic insufficiency and hypertension. Last echocardiogram in July of 2013 showed an ejection fraction of 45-50% and moderate aortic insufficiency. There was mild mitral regurgitation and mild left atrial enlargement.  Patient denies dyspnea on exertion, orthopnea, PND, pedal edema, palpitations or syncope. Yesterday she turned her head and had transient dizziness for 10 seconds not associated with chest pain, palpitations and no syncope. Seen in oncology clinic and noted to be bradycardic with a heart rate of 47. Dr. Gaylyn Rong discussed with Dr Riley Kill and patient referred for evaluation today.   Current Outpatient Prescriptions  Medication Sig Dispense Refill  . aspirin 325 MG EC tablet Take 325 mg by mouth daily.      [name] dexamethasone (DECADRON) 4 MG tablet Take 5 tablets (20 mg total) by mouth once a week.  60 tablet  1  . gabapentin (NEURONTIN) 100 MG capsule 2 tabs po qhs      . lenalidomide (REVLIMID) 10 MG capsule Take 1 capsule (10 mg total) by mouth daily. Take for 21 days then take 7 days off.  21 capsule  0  . levothyroxine (SYNTHROID, LEVOTHROID) 50 MCG tablet Take 50 mcg by mouth daily.      [name] loperamide (IMODIUM A-D) 2 MG tablet Take 2 mg by mouth as needed for diarrhea or loose stools (as directed.).      [name] losartan (COZAAR) 25 MG tablet Take 1 tablet (25 mg total) by mouth daily.  90 tablet  3  . meloxicam (MOBIC) 7.5 MG tablet Take 7.5 mg by mouth 2 (two) times daily as needed.       [name] VITAMIN D-VITAMIN K PO Take 1 tablet by mouth daily. With Calcium 500 mg       No current facility-administered medications for this visit.     Past Medical History  Diagnosis Date  . Breast cancer   . Multiple myeloma(203.0)   . Aortic regurgitation   . HTN (hypertension)   . Hot flash not due to menopause 12/23/2011  . Neuropathy 12/23/2011  . DCIS (ductal carcinoma in situ) 08/04/2012    Past Surgical History    Procedure Laterality Date  . Lymph node excision - left groin    . Incisional hernia repair      History   Social History  . Marital Status: Married    Spouse Name: N/A    Number of Children: N/A  . Years of Education: N/A   Occupational History  . Not on file.   Social History Main Topics  . Smoking status: Never Smoker   . Smokeless tobacco: Not on file  . Alcohol Use: No  . Drug Use: Not on file  . Sexually Active: Not on file   Other Topics Concern  . Not on file   Social History Narrative  . No narrative on file    ROS: no fevers or chills, productive cough, hemoptysis, dysphasia, odynophagia, melena, hematochezia, dysuria, hematuria, rash, seizure activity, orthopnea, PND, pedal edema, claudication. Remaining systems are negative.  Physical Exam: Well-developed well-nourished in no acute distress.  Skin is warm and dry.  HEENT is normal.  Neck is supple.  Chest is clear to auscultation with normal expansion.  Cardiovascular exam is bradycardic Abdominal exam nontender or distended. No masses palpated. Extremities show no edema. neuro grossly intact  ECG 09/21/12 marked sinus bradycardia at a rate of 47. Left bundle branch block.

## 2012-09-22 NOTE — Assessment & Plan Note (Signed)
Blood pressure controlled. Continue present medications. 

## 2012-09-23 LAB — PROTEIN ELECTROPHORESIS, SERUM
Albumin ELP: 59.5 % (ref 55.8–66.1)
Alpha-2-Globulin: 12.3 % — ABNORMAL HIGH (ref 7.1–11.8)
Beta Globulin: 6.1 % (ref 4.7–7.2)
Total Protein, Serum Electrophoresis: 6.4 g/dL (ref 6.0–8.3)

## 2012-09-29 ENCOUNTER — Ambulatory Visit (INDEPENDENT_AMBULATORY_CARE_PROVIDER_SITE_OTHER): Payer: Medicare Other

## 2012-09-29 DIAGNOSIS — R001 Bradycardia, unspecified: Secondary | ICD-10-CM

## 2012-09-29 DIAGNOSIS — I498 Other specified cardiac arrhythmias: Secondary | ICD-10-CM

## 2012-09-30 ENCOUNTER — Encounter: Payer: Self-pay | Admitting: Cardiology

## 2012-09-30 NOTE — Progress Notes (Signed)
Placed a 24 hr  monitor and went over instructions on how to use it and when to return it.  

## 2012-10-03 ENCOUNTER — Other Ambulatory Visit: Payer: Self-pay | Admitting: Oncology

## 2012-10-03 ENCOUNTER — Telehealth: Payer: Self-pay | Admitting: *Deleted

## 2012-10-03 DIAGNOSIS — C9000 Multiple myeloma not having achieved remission: Secondary | ICD-10-CM

## 2012-10-03 NOTE — Telephone Encounter (Signed)
I'll turn in a POF.  Thanks.  

## 2012-10-03 NOTE — Telephone Encounter (Signed)
Pt left VM, she has no appts scheduled.  Says she last saw Dr. Gaylyn Rong on 3/19 and was told to f/u in one month before 3rd cycle of chemo.

## 2012-10-04 ENCOUNTER — Telehealth: Payer: Self-pay | Admitting: Oncology

## 2012-10-04 NOTE — Telephone Encounter (Signed)
S/w pt re appt for 4/28.

## 2012-10-10 ENCOUNTER — Telehealth: Payer: Self-pay

## 2012-10-10 NOTE — Telephone Encounter (Signed)
**Note De-Identified  Obfuscation** Pt given holter results and advised that Dr. Eden Emms will go over results in more detail with her at her OV on 4/22, she verbalized understanding.

## 2012-10-25 ENCOUNTER — Encounter: Payer: Self-pay | Admitting: Cardiovascular Disease

## 2012-10-25 ENCOUNTER — Telehealth: Payer: Self-pay | Admitting: *Deleted

## 2012-10-25 ENCOUNTER — Ambulatory Visit (INDEPENDENT_AMBULATORY_CARE_PROVIDER_SITE_OTHER): Payer: Medicare Other | Admitting: Cardiovascular Disease

## 2012-10-25 VITALS — BP 141/54 | HR 50 | Wt 134.0 lb

## 2012-10-25 DIAGNOSIS — I498 Other specified cardiac arrhythmias: Secondary | ICD-10-CM

## 2012-10-25 DIAGNOSIS — G629 Polyneuropathy, unspecified: Secondary | ICD-10-CM

## 2012-10-25 DIAGNOSIS — I447 Left bundle-branch block, unspecified: Secondary | ICD-10-CM

## 2012-10-25 DIAGNOSIS — R001 Bradycardia, unspecified: Secondary | ICD-10-CM

## 2012-10-25 DIAGNOSIS — I10 Essential (primary) hypertension: Secondary | ICD-10-CM

## 2012-10-25 DIAGNOSIS — I359 Nonrheumatic aortic valve disorder, unspecified: Secondary | ICD-10-CM

## 2012-10-25 DIAGNOSIS — G589 Mononeuropathy, unspecified: Secondary | ICD-10-CM

## 2012-10-25 DIAGNOSIS — C9000 Multiple myeloma not having achieved remission: Secondary | ICD-10-CM

## 2012-10-25 MED ORDER — LENALIDOMIDE 10 MG PO CAPS: 10.0000 mg | ORAL_CAPSULE | Freq: Every day | ORAL | Status: DC

## 2012-10-25 NOTE — Assessment & Plan Note (Signed)
Moderate with no cardiac enlargement BP controlled Not an operative candidate  Follow clinically

## 2012-10-25 NOTE — Telephone Encounter (Signed)
Pt called to state has not gotten refill on Revlimid yet.  She is due to start next cycle on 10/28/12.   Her next lab/office visit w/ Belenda Cruise in on 4/28.   Rx left on Dr. Lodema Pilot desk for review and signature.  Berkley Harvey #[id number].

## 2012-10-25 NOTE — Progress Notes (Signed)
Patient ID: Natasha Jackson, female   DOB: 1926-06-01, 79 y.o.   MRN: [id number] HPI: pleasant female followed by last seen by Dr Jens Som  09/22/12 for evaluation of bradycardia; has h/o aortic insufficiency and hypertension. Last echocardiogram in July of 2013 showed an ejection fraction of 45-50% and moderate aortic insufficiency. There was mild mitral regurgitation and mild left atrial enlargement. Patient denies dyspnea on exertion, orthopnea, PND, pedal edema, palpitations or syncope. Yesterday she turned her head and had transient dizziness for 10 seconds not associated with chest pain, palpitations and no syncope. Currently being Rx for myeloma with revlimid and decadron.  Bothered most by neuropathy in feet  F/U event monitor showed SR with PAC;s and PVC;s mean HR 62 with min rate early in am 46 with sinus bradycaria no AV block  ROS: Denies fever, malais, weight loss, blurry vision, decreased visual acuity, cough, sputum, SOB, hemoptysis, pleuritic pain, palpitaitons, heartburn, abdominal pain, melena, lower extremity edema, claudication, or rash.  All other systems reviewed and negative  General: Affect appropriate Healthy:  appears stated age HEENT: normal Neck supple with no adenopathy JVP normal no bruits no thyromegaly Lungs clear with no wheezing and good diaphragmatic motion Heart:  S1/S2 AR murmur, no rub, gallop or click PMI normal Abdomen: benighn, BS positve, no tenderness, no AAA no bruit.  No HSM or HJR Distal pulses intact with no bruits No edema Neuro non-focal Skin warm and dry No muscular weakness   Current Outpatient Prescriptions  Medication Sig Dispense Refill  . aspirin 325 MG EC tablet Take 325 mg by mouth daily.      [name] dexamethasone (DECADRON) 4 MG tablet Take 5 tablets (20 mg total) by mouth once a week.  60 tablet  1  . gabapentin (NEURONTIN) 100 MG capsule 2 tabs po qhs      . levothyroxine (SYNTHROID, LEVOTHROID) 50 MCG tablet Take 50 mcg by mouth daily.       [name] loperamide (IMODIUM A-D) 2 MG tablet Take 2 mg by mouth as needed for diarrhea or loose stools (as directed.).      [name] losartan (COZAAR) 25 MG tablet Take 1 tablet (25 mg total) by mouth daily.  90 tablet  3  . meloxicam (MOBIC) 7.5 MG tablet Take 7.5 mg by mouth 2 (two) times daily as needed.       [name] VITAMIN D-VITAMIN K PO Take 1 tablet by mouth daily. With Calcium 500 mg      . lenalidomide (REVLIMID) 10 MG capsule Take 1 capsule (10 mg total) by mouth daily. Take for 21 days then take 7 days off.  21 capsule  0   No current facility-administered medications for this visit.    Allergies  Review of patient's allergies indicates no known allergies.  Electrocardiogram:  3/19  SR rate 47 LBBB LAD  Assessment and Plan

## 2012-10-25 NOTE — Assessment & Plan Note (Signed)
Asymptomatic no indication for pacer Observe

## 2012-10-25 NOTE — Assessment & Plan Note (Signed)
Stable no AV block on event monitor q 6 month ECG

## 2012-10-25 NOTE — Assessment & Plan Note (Signed)
F/U with pimary Not a diabetic Consider trial of neurontin and neurology referral

## 2012-10-25 NOTE — Assessment & Plan Note (Signed)
Well controlled.  Continue current medications and low sodium Dash type diet.    

## 2012-10-25 NOTE — Patient Instructions (Signed)
Your physician wants you to follow-up in:  6 MONTHS WITH DR NISHAN  You will receive a reminder letter in the mail two months in advance. If you don't receive a letter, please call our office to schedule the follow-up appointment. Your physician recommends that you continue on your current medications as directed. Please refer to the Current Medication list given to you today. 

## 2012-10-25 NOTE — Assessment & Plan Note (Signed)
Stable f/u Dr Gaylyn Rong on revlimid and decadron

## 2012-10-28 ENCOUNTER — Telehealth: Payer: Self-pay | Admitting: *Deleted

## 2012-10-28 NOTE — Telephone Encounter (Signed)
Call from pt states she s/w [facility] and they still do not have refill on Revlimid.   This RN faxed the Revlimid rx to [facility] yesterday.   Called [employer] to check on Revlimid order.  I was told

## 2012-10-28 NOTE — Telephone Encounter (Signed)
Re- faxed Revlimid Rx to [facility] with "Adult Female Not of reproductive potential" written on Rx.  Notified pt of rx faxed again w/ more information that was requested by pharmacy.

## 2012-10-31 ENCOUNTER — Encounter: Payer: Self-pay | Admitting: Oncology

## 2012-10-31 ENCOUNTER — Ambulatory Visit (HOSPITAL_BASED_OUTPATIENT_CLINIC_OR_DEPARTMENT_OTHER): Payer: Medicare Other | Admitting: Oncology

## 2012-10-31 ENCOUNTER — Other Ambulatory Visit (HOSPITAL_BASED_OUTPATIENT_CLINIC_OR_DEPARTMENT_OTHER): Payer: Medicare Other | Admitting: Lab

## 2012-10-31 ENCOUNTER — Telehealth: Payer: Self-pay | Admitting: Oncology

## 2012-10-31 VITALS — BP 149/47 | HR 74 | Temp 97.8°F | Resp 18 | Ht 64.5 in | Wt 134.6 lb

## 2012-10-31 DIAGNOSIS — C9002 Multiple myeloma in relapse: Secondary | ICD-10-CM

## 2012-10-31 DIAGNOSIS — I498 Other specified cardiac arrhythmias: Secondary | ICD-10-CM

## 2012-10-31 DIAGNOSIS — G589 Mononeuropathy, unspecified: Secondary | ICD-10-CM

## 2012-10-31 DIAGNOSIS — D0590 Unspecified type of carcinoma in situ of unspecified breast: Secondary | ICD-10-CM

## 2012-10-31 DIAGNOSIS — C9000 Multiple myeloma not having achieved remission: Secondary | ICD-10-CM

## 2012-10-31 LAB — CBC WITH DIFFERENTIAL/PLATELET
BASO%: 3.4 % — ABNORMAL HIGH (ref 0.0–2.0)
Basophils Absolute: 0.1 10*3/uL (ref 0.0–0.1)
EOS%: 2.6 % (ref 0.0–7.0)
HCT: 33.4 % — ABNORMAL LOW (ref 34.8–46.6)
HGB: 11.3 g/dL — ABNORMAL LOW (ref 11.6–15.9)
LYMPH%: 25 % (ref 14.0–49.7)
MCH: 33.3 pg (ref 25.1–34.0)
MCHC: 33.7 g/dL (ref 31.5–36.0)
MCV: 98.7 fL (ref 79.5–101.0)
MONO%: 12.6 % (ref 0.0–14.0)
NEUT%: 56.4 % (ref 38.4–76.8)
Platelets: 221 10*3/uL (ref 145–400)
lymph#: 0.9 10*3/uL (ref 0.9–3.3)

## 2012-10-31 LAB — COMPREHENSIVE METABOLIC PANEL (CC13)
ALT: 18 U/L (ref 0–55)
CO2: 27 mEq/L (ref 22–29)
Calcium: 9 mg/dL (ref 8.4–10.4)
Chloride: 102 mEq/L (ref 98–107)
Creatinine: 1.1 mg/dL (ref 0.6–1.1)

## 2012-10-31 NOTE — Progress Notes (Signed)
[facility]  Telephone:(336) [phone] Fax:(336) [phone]   OFFICE PROGRESS NOTE   Cc:  Charlton Haws, MD  DIAGNOSIS AND PAST THERAPY:  1. 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 She finished adjuvant radiation therapy May 25, 2008. She was on adjuvant tamoxifen from November 2009 to June 2010, which was discontinued due to intolerance of hot flash/night sweat and new diagnosis of myeloma.  2. 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 She presented with anemia. 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   CURRENT THERAPY:  1. For history of DCIS, she is on watchful observation.  2. For multiple myeloma, she was started on Revlimid/Melphalandexamethasone. She had grade 3 pancytopenia. Therefore since the second cycle she has been receiving Revlimid day 1, through 21 every 28 day and dexamethasone. She stopped Revlimid in 02/2011 due to persistent neuropathy. She had persistent increase in Mspike and resumed on Revlimid and Dexamethasone on 09/03/2012.   INTERVAL HISTORY: Natasha Jackson 79 y.o. female returns for regular follow up with her husband.  She reports feeling relatively well since starting Rev/Dex. She has not had worsening of her lower extremity neuropathy with resumption of her Revlimid. Diarrhea has resolved.  She denied abdominal pain, rectal bleeding, nausea/vomiting. She has been seen by cardiology since her last visit due to bradycardia. She has not had any persistent dizziness. No chest pain, shortness  of breath, dyspnea. She denied headache, palpitation.       Past Medical History  Diagnosis Date  . Breast cancer   . Multiple myeloma(203.0)   . Aortic regurgitation   . HTN (hypertension)   . Hot flash not due to menopause 12/23/2011  . Neuropathy 12/23/2011  . DCIS (ductal carcinoma in situ) 08/04/2012    Past Surgical History  Procedure Laterality Date  . Lymph node excision - left groin    . Incisional hernia repair      Current Outpatient Prescriptions  Medication Sig Dispense Refill  . aspirin 325 MG EC tablet Take 325 mg by mouth daily.      [name] dexamethasone (DECADRON) 4 MG tablet Take 5 tablets (20 mg total) by mouth once a week.  60 tablet  1  . gabapentin (NEURONTIN) 100 MG capsule 2 tabs po qhs      . lenalidomide (REVLIMID) 10 MG capsule Take 1 capsule (10 mg total) by mouth daily. Take for 21 days then take 7 days off.  21 capsule  0  . levothyroxine (SYNTHROID, LEVOTHROID) 50 MCG tablet Take 50 mcg by mouth daily.      [name] loperamide (IMODIUM A-D) 2 MG tablet Take 2 mg by mouth as needed for diarrhea or loose stools (as directed.).      [name] losartan (COZAAR) 25 MG tablet Take 1 tablet (25 mg total) by mouth daily.  90 tablet  3  . meloxicam (MOBIC) 7.5 MG tablet Take 7.5 mg by mouth 2 (two) times daily as needed.       [name] VITAMIN D-VITAMIN K PO Take 1 tablet by  mouth daily. With Calcium 500 mg       No current facility-administered medications for this visit.    ALLERGIES:  has No Known Allergies.  REVIEW OF SYSTEMS:  The rest of the 14-point review of system was negative.   Filed Vitals:   10/31/12 1402  BP: 149/47  Pulse: 74  Temp: 97.8 F (36.6 C)  Resp: 18   Wt Readings from Last 3 Encounters:  10/31/12 134 lb 9.6 oz (61.054 kg)  10/25/12 134 lb (60.782 kg)  09/22/12 134 lb (60.782 kg)   ECOG Performance status: 0  PHYSICAL EXAMINATION:   General:  Thin-appearing woman, in no acute distress.  Eyes:  no scleral icterus.  ENT:  There were no  oropharyngeal lesions.  Neck was without thyromegaly.  Lymphatics:  Negative cervical, supraclavicular or axillary adenopathy.  Respiratory: lungs were clear bilaterally without wheezing or crackles.  Cardiovascular:  Regular rate and rhythm, S1/S2, without murmur, rub or gallop.  There was no pedal edema.  GI:  abdomen was soft, flat, nontender, nondistended, without organomegaly.  Muscoloskeletal:  no spinal tenderness of palpation of vertebral spine.  There was mild discomfort to palpation of left upper back by the scapula without erythema, skin rash, palpable mass.  Skin exam was without echymosis, petichae.  Neuro exam was nonfocal.  Patient was able to get on and off exam table without assistance.  Gait was normal.  Patient was alert and oriented.  Attention was good.   Language was appropriate.  Mood was normal without depression.  Speech was not pressured.  Thought content was not tangential.  Bilateral breast exam was negative for any palpable mass, nipple discharge, nipple inversion, skin thickening. There was normal right breast lumpectomy scar. There was pain on breast exam.    LABORATORY/RADIOLOGY DATA:  Lab Results  Component Value Date   WBC 3.6* 10/31/2012   HGB 11.3* 10/31/2012   HCT 33.4* 10/31/2012   PLT 221 10/31/2012   GLUCOSE 78 10/31/2012   ALKPHOS 69 10/31/2012   ALT 18 10/31/2012   AST 25 10/31/2012   NA 136 10/31/2012   K 4.1 10/31/2012   CL 102 10/31/2012   CREATININE 1.1 10/31/2012   BUN 21.2 10/31/2012   CO2 27 10/31/2012    ASSESSMENT AND PLAN:   1. Relapsed multiple myeloma: Recommend that she proceed with cycle 3 of her dexamethasone and Revlimid.  Plan for 21 days on, 7 days off.  Mspike from today is still pending to assess response.   2. Symptomatic bradycardia:  She is asymptomatic today and her heart rate is normal. She has had a workup by cardiology. She'll be seeing them every 6 months. 3.  Neuropathy: On gabapentin per PCP. 4. Hypothyroidism: She is on synthroid  per PCP.  5. History of ductal carcinoma in situ: On observation. Her next mammogram is due in Feb 2015; her last mammogram in 10/2012 was negative.  6. Diarrhea:  Resolved. 7. Follow up:  In 1 month before the 4th cycle of chemo.      The length of time of the face-to-face encounter was 15 minutes. More than 50% of time was spent counseling and coordination of care.

## 2012-11-01 ENCOUNTER — Other Ambulatory Visit: Payer: Self-pay | Admitting: Dermatology

## 2012-11-02 LAB — KAPPA/LAMBDA LIGHT CHAINS: Kappa free light chain: 2.23 mg/dL — ABNORMAL HIGH (ref 0.33–1.94)

## 2012-11-02 LAB — PROTEIN ELECTROPHORESIS, SERUM
Alpha-1-Globulin: 4.2 % (ref 2.9–4.9)
Beta 2: 3.8 % (ref 3.2–6.5)
Gamma Globulin: 12.8 % (ref 11.1–18.8)
M-Spike, %: 0.39 g/dL

## 2012-11-23 ENCOUNTER — Other Ambulatory Visit: Payer: Self-pay | Admitting: *Deleted

## 2012-11-23 DIAGNOSIS — C9000 Multiple myeloma not having achieved remission: Secondary | ICD-10-CM

## 2012-11-23 MED ORDER — LENALIDOMIDE 10 MG PO CAPS: 10.0000 mg | ORAL_CAPSULE | Freq: Every day | ORAL | Status: DC

## 2012-11-25 ENCOUNTER — Telehealth: Payer: Self-pay | Admitting: Oncology

## 2012-11-25 ENCOUNTER — Ambulatory Visit (HOSPITAL_BASED_OUTPATIENT_CLINIC_OR_DEPARTMENT_OTHER): Payer: Medicare Other | Admitting: Oncology

## 2012-11-25 ENCOUNTER — Other Ambulatory Visit (HOSPITAL_BASED_OUTPATIENT_CLINIC_OR_DEPARTMENT_OTHER): Payer: Medicare Other

## 2012-11-25 VITALS — BP 140/41 | HR 67 | Temp 97.6°F | Resp 18 | Ht 64.5 in | Wt 135.1 lb

## 2012-11-25 DIAGNOSIS — C9000 Multiple myeloma not having achieved remission: Secondary | ICD-10-CM

## 2012-11-25 DIAGNOSIS — C50319 Malignant neoplasm of lower-inner quadrant of unspecified female breast: Secondary | ICD-10-CM

## 2012-11-25 LAB — COMPREHENSIVE METABOLIC PANEL (CC13)
ALT: 23 U/L (ref 0–55)
CO2: 26 mEq/L (ref 22–29)
Calcium: 8.4 mg/dL (ref 8.4–10.4)
Chloride: 103 mEq/L (ref 98–107)
Potassium: 3.8 mEq/L (ref 3.5–5.1)
Sodium: 137 mEq/L (ref 136–145)
Total Protein: 5.9 g/dL — ABNORMAL LOW (ref 6.4–8.3)

## 2012-11-25 LAB — CBC WITH DIFFERENTIAL/PLATELET
BASO%: 1.4 % (ref 0.0–2.0)
HCT: 31.7 % — ABNORMAL LOW (ref 34.8–46.6)
MCHC: 33.8 g/dL (ref 31.5–36.0)
MONO#: 0.4 10*3/uL (ref 0.1–0.9)
NEUT%: 57.5 % (ref 38.4–76.8)
RBC: 3.18 10*6/uL — ABNORMAL LOW (ref 3.70–5.45)
RDW: 15.6 % — ABNORMAL HIGH (ref 11.2–14.5)
WBC: 3.4 10*3/uL — ABNORMAL LOW (ref 3.9–10.3)
lymph#: 0.7 10*3/uL — ABNORMAL LOW (ref 0.9–3.3)

## 2012-11-28 NOTE — Progress Notes (Signed)
[facility]  Telephone:(336) [phone] Fax:(336) [phone]   OFFICE PROGRESS NOTE   Cc:  Charlton Haws, MD  DIAGNOSIS AND PAST THERAPY:  1. 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 She finished adjuvant radiation therapy May 25, 2008. She was on adjuvant tamoxifen from November 2009 to June 2010, which was discontinued due to intolerance of hot flash/night sweat and new diagnosis of myeloma.  2. 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 She presented with anemia. 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   CURRENT THERAPY:  1. For history of DCIS, she is on watchful observation.  2. For multiple myeloma, she was started on Revlimid/melphalan/dexamethasone. She had grade 3 pancytopenia. Therefore since the second cycle she has been receiving Revlimid day 1, through 21 every 28 day and dexamethasone. She stopped Revlimid in 02/2011 due to persistent neuropathy. She had persistent increase in Mspike and resumed on Revlimid and Dexamethasone on 09/03/2012.   INTERVAL HISTORY: Natasha Jackson 79 y.o. female returns for regular follow up with her husband.  She reported mild fatigue a few days after Dexamethasone.  She also has mild diarrhea during the same days which improved with OTC Imodium.  Her bilateral feet neuropathy is stable and has not worsened with resumption of Revlimid.  Despite her transient fatigue, she was still able to perform all activities of daily living.  She does not feel fatigue today.  She denied  fever, mucositis, SOB, chest pain, abd pain, bleeding, skin rash.  The rest of the 14-point review of system was negative.     Past Medical History  Diagnosis Date  . Breast cancer   . Multiple myeloma(203.0)   . Aortic regurgitation   . HTN (hypertension)   . Hot flash not due to menopause 12/23/2011  . Neuropathy 12/23/2011  . DCIS (ductal carcinoma in situ) 08/04/2012    Past Surgical History  Procedure Laterality Date  . Lymph node excision - left groin    . Incisional hernia repair      Current Outpatient Prescriptions  Medication Sig Dispense Refill  . aspirin 325 MG EC tablet Take 325 mg by mouth daily.      [name] dexamethasone (DECADRON) 4 MG tablet Take 5 tablets (20 mg total) by mouth once a week.  60 tablet  1  . gabapentin (NEURONTIN) 100 MG capsule 2-3 tabs po qhs      . lenalidomide (REVLIMID) 10 MG capsule Take 1 capsule (10 mg total) by mouth daily. Take for 21 days then take 7 days off.  21 capsule  0  . levothyroxine (SYNTHROID, LEVOTHROID) 50 MCG tablet Take 50 mcg by mouth daily.      [name] loperamide (IMODIUM A-D) 2 MG tablet Take 2 mg by mouth as needed for diarrhea or loose stools (as directed.).      [name] losartan (COZAAR) 25 MG tablet Take 1 tablet (25 mg total) by mouth daily.  90 tablet  3  . meloxicam (MOBIC) 7.5 MG tablet Take 7.5 mg by mouth 2 (two)  times daily as needed.       [name] VITAMIN D-VITAMIN K PO Take 1 tablet by mouth daily. With Calcium 500 mg       No current facility-administered medications for this visit.    ALLERGIES:  has No Known Allergies.  REVIEW OF SYSTEMS:  The rest of the 14-point review of system was negative.   Filed Vitals:   11/25/12 1345  BP: 140/41  Pulse: 67  Temp: 97.6 F (36.4 C)  Resp: 18   Wt Readings from Last 3 Encounters:  11/25/12 135 lb 1.6 oz (61.281 kg)  10/31/12 134 lb 9.6 oz (61.054 kg)  10/25/12 134 lb (60.782 kg)   ECOG Performance status: 0  PHYSICAL EXAMINATION:   General:  Thin-appearing woman, in no  acute distress.  Eyes:  no scleral icterus.  ENT:  There were no oropharyngeal lesions.  Neck was without thyromegaly.  Lymphatics:  Negative cervical, supraclavicular or axillary adenopathy.  Respiratory: lungs were clear bilaterally without wheezing or crackles.  Cardiovascular:  Regular rate and rhythm, S1/S2, without murmur, rub or gallop.  There was no pedal edema.  GI:  abdomen was soft, flat, nontender, nondistended, without organomegaly.  Muscoloskeletal:  no spinal tenderness of palpation of vertebral spine.  There was mild discomfort to palpation of left upper back by the scapula without erythema, skin rash, palpable mass.  Skin exam was without echymosis, petichae.  Neuro exam was nonfocal.  Patient was able to get on and off exam table without assistance.  Gait was normal.  Patient was alert and oriented.  Attention was good.   Language was appropriate.  Mood was normal without depression.  Speech was not pressured.  Thought content was not tangential.    LABORATORY/RADIOLOGY DATA:  Lab Results  Component Value Date   WBC 3.4* 11/25/2012   HGB 10.7* 11/25/2012   HCT 31.7* 11/25/2012   PLT 202 11/25/2012   GLUCOSE 91 11/25/2012   ALKPHOS 64 11/25/2012   ALT 23 11/25/2012   AST 25 11/25/2012   NA 137 11/25/2012   K 3.8 11/25/2012   CL 103 11/25/2012   CREATININE 0.7 11/25/2012   BUN 14.8 11/25/2012   CO2 26 11/25/2012      ASSESSMENT AND PLAN:   1. Relapsed multiple myeloma: she has grade 1 fatigue, grade 1 diarrhea, grade 1 leukopenia and thrombocytopenia.  She has been having response with decreasing M-spike.  I recommended decreasing Decadron from 20mg  to 15 or 10mg .  However, she declined dose change for now.  Will continue the next cycle of Revlimid/Dex without dose change.  In the future, if her transient fatigue worsens, we will recommend dose reduction of Dexamethasone again and maybe also Revlimid if symptoms do not improve.  2. History of bradycardia:  No longer symptomatic.  She  was seen by Dr. Eden Emms with no further evaluation or intervention indicated.  3.  Neuropathy: On gabapentin per PCP.  This is most likely not due to Revlimid since it predated Revlimid and has not worsened with resumption of Revlimid this time.  4. Hypothyroidism: She is on synthroid per PCP.  5. History of ductal carcinoma in situ: On observation. Her next mammogram is due in Feb 2015; her last mammogram in 10/2012 was negative.  6. Transient diarrhea:  Due to most likely Revlimid.  It is grade 1.  She has OTC Imodium prn.  7. Follow up:  In 2 months.    I informed Ms. Soyars that I am leaving the practice.  [facility] to see a new provider when she returns.      The length of time of the face-to-face encounter was 25 minutes. More than 50% of time was spent counseling and coordination of care.

## 2012-11-30 ENCOUNTER — Other Ambulatory Visit: Payer: Self-pay | Admitting: Oncology

## 2012-11-30 ENCOUNTER — Other Ambulatory Visit: Payer: Self-pay | Admitting: *Deleted

## 2012-11-30 ENCOUNTER — Encounter: Payer: Self-pay | Admitting: Oncology

## 2012-11-30 ENCOUNTER — Telehealth: Payer: Self-pay | Admitting: *Deleted

## 2012-11-30 LAB — PROTEIN ELECTROPHORESIS, SERUM
Beta 2: 3 % — ABNORMAL LOW (ref 3.2–6.5)
Beta Globulin: 6.6 % (ref 4.7–7.2)
M-Spike, %: 0.4 g/dL

## 2012-11-30 LAB — KAPPA/LAMBDA LIGHT CHAINS: Kappa free light chain: 1.95 mg/dL — ABNORMAL HIGH (ref 0.33–1.94)

## 2012-11-30 MED ORDER — LOSARTAN POTASSIUM 25 MG PO TABS: 25.0000 mg | ORAL_TABLET | Freq: Every day | ORAL | Status: DC

## 2012-11-30 NOTE — Telephone Encounter (Signed)
Spoke w/ husband as pt was not at home.  Read him letter that Dr. Gaylyn Rong sent out today w/ M-spike results and to continue current treatment.  He verbalized understanding.

## 2012-11-30 NOTE — Telephone Encounter (Signed)
Pt calling for results of M-Spike.  They are not viewable on MyChart.  Asks if they can be released to MyChart or called w/ result.

## 2012-12-02 ENCOUNTER — Other Ambulatory Visit: Payer: Self-pay | Admitting: *Deleted

## 2012-12-02 NOTE — Telephone Encounter (Signed)
Faxed new Revlimid Rx to [facility] at Fax #[phone].   Auth # [id number].   A Rx was faxed on 5/21, but apparently to wrong fax #.  Resent today.

## 2012-12-06 ENCOUNTER — Telehealth: Payer: Self-pay | Admitting: *Deleted

## 2012-12-06 NOTE — Telephone Encounter (Signed)
Pt asking if ok to take benadryl occasionally?  She thinks Dr. Gaylyn Rong told her to avoid benadryl for some reason, but she has been taking Tylenol PM w/ 25 mg benadryl to help her sleep 2 to 3 times per month.  She wants to know if this is ok w/ Dr. Gaylyn Rong?

## 2012-12-07 NOTE — Telephone Encounter (Signed)
Infomed pt ok to take benadryl occasionally per Dr. Gaylyn Rong.  He just cautions that it can cause urinary retention if taken on regular basis.  Pt verbalized understanding.

## 2012-12-19 ENCOUNTER — Telehealth: Payer: Self-pay | Admitting: *Deleted

## 2012-12-19 NOTE — Telephone Encounter (Signed)
VM from Peggy at Dr. Elder Cyphers dental office.  Asks if any precautions they need to take with patient as far as dental procedures.  Pt possibly needs a Crown in the near future.

## 2012-12-19 NOTE — Telephone Encounter (Signed)
No precaution needed.  She is no longer on Zometa.  Thanks.

## 2012-12-20 NOTE — Telephone Encounter (Signed)
S/w Peggy at Dr. Yetta Barre office.  She requests letter or note faxed to them stating no special precautions required for dental procedures.  Letter left for Dr. Lodema Pilot signature.

## 2012-12-21 NOTE — Progress Notes (Signed)
Patient states that this is her 2nd day without taking Revlimid, medication has not been delivered to her yet.

## 2013-01-19 ENCOUNTER — Other Ambulatory Visit: Payer: Self-pay | Admitting: *Deleted

## 2013-01-19 DIAGNOSIS — C9000 Multiple myeloma not having achieved remission: Secondary | ICD-10-CM

## 2013-01-19 MED ORDER — LENALIDOMIDE 10 MG PO CAPS: 10.0000 mg | ORAL_CAPSULE | Freq: Every day | ORAL | Status: DC

## 2013-01-19 NOTE — Telephone Encounter (Signed)
Pt called yesterday to report she took her last Revlimid of this cycle on 7/15.  She will need refill soon.   Rx sent electronically to [facility].

## 2013-01-25 ENCOUNTER — Ambulatory Visit (HOSPITAL_BASED_OUTPATIENT_CLINIC_OR_DEPARTMENT_OTHER): Payer: Medicare Other | Admitting: Oncology

## 2013-01-25 ENCOUNTER — Other Ambulatory Visit (HOSPITAL_BASED_OUTPATIENT_CLINIC_OR_DEPARTMENT_OTHER): Payer: Medicare Other | Admitting: Lab

## 2013-01-25 ENCOUNTER — Encounter: Payer: Self-pay | Admitting: Oncology

## 2013-01-25 ENCOUNTER — Telehealth: Payer: Self-pay | Admitting: *Deleted

## 2013-01-25 VITALS — BP 137/58 | HR 54 | Temp 98.1°F | Resp 18 | Ht 64.0 in | Wt 132.8 lb

## 2013-01-25 DIAGNOSIS — C9000 Multiple myeloma not having achieved remission: Secondary | ICD-10-CM

## 2013-01-25 DIAGNOSIS — E039 Hypothyroidism, unspecified: Secondary | ICD-10-CM

## 2013-01-25 LAB — CBC WITH DIFFERENTIAL/PLATELET
BASO%: 2.6 % — ABNORMAL HIGH (ref 0.0–2.0)
LYMPH%: 18.5 % (ref 14.0–49.7)
MCHC: 33.5 g/dL (ref 31.5–36.0)
MCV: 99 fL (ref 79.5–101.0)
MONO#: 0.7 10*3/uL (ref 0.1–0.9)
MONO%: 15.2 % — ABNORMAL HIGH (ref 0.0–14.0)
Platelets: 230 10*3/uL (ref 145–400)
RBC: 3.28 10*6/uL — ABNORMAL LOW (ref 3.70–5.45)
WBC: 4.6 10*3/uL (ref 3.9–10.3)

## 2013-01-25 LAB — COMPREHENSIVE METABOLIC PANEL (CC13)
AST: 21 U/L (ref 5–34)
Albumin: 3.2 g/dL — ABNORMAL LOW (ref 3.5–5.0)
BUN: 19.4 mg/dL (ref 7.0–26.0)
CO2: 26 mEq/L (ref 22–29)
Calcium: 8.6 mg/dL (ref 8.4–10.4)
Chloride: 103 mEq/L (ref 98–109)
Potassium: 3.6 mEq/L (ref 3.5–5.1)

## 2013-01-25 LAB — TSH CHCC: TSH: 2.745 m(IU)/L (ref 0.308–3.960)

## 2013-01-25 NOTE — Telephone Encounter (Signed)
Message copied by Wende Mott on Wed Jan 25, 2013  4:22 PM ------      Message from: Clenton Pare R      Created: Wed Jan 25, 2013  3:54 PM       Please call pt and let her know that TSH is normal. No change in Synthroid dose. ------

## 2013-01-25 NOTE — Progress Notes (Signed)
[facility]  Telephone:(336) [phone] Fax:(336) [phone]   OFFICE PROGRESS NOTE   Cc:  Charlton Haws, MD  DIAGNOSIS AND PAST THERAPY:  1. 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 She finished adjuvant radiation therapy May 25, 2008. She was on adjuvant tamoxifen from November 2009 to June 2010, which was discontinued due to intolerance of hot flash/night sweat and new diagnosis of myeloma.  2. 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 She presented with anemia. 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   CURRENT THERAPY:  1. For history of DCIS, she is on watchful observation.  2. For multiple myeloma, she was started on Revlimid/melphalan/dexamethasone. She had grade 3 pancytopenia. Therefore since the second cycle she has been receiving Revlimid day 1, through 21 every 28 day and dexamethasone. She stopped Revlimid in 02/2011 due to persistent neuropathy. She had persistent increase in Mspike and resumed on Revlimid and Dexamethasone on 09/03/2012.   INTERVAL HISTORY: Natasha Jackson 79 y.o. female returns for regular follow up by herself.  She reported mild fatigue a few days after Dexamethasone.  She also has mild diarrhea during the same days which improved with OTC Imodium.  Her bilateral feet neuropathy is stable and has not worsened with resumption of Revlimid.  Despite her transient fatigue, she was still able to perform all activities of daily living. She denied fever, mucositis, SOB, chest pain, abd pain,  bleeding, skin rash.  Reports increased irritability and tearfulness at times. The rest of the 14-point review of system was negative.     Past Medical History  Diagnosis Date  . Breast cancer   . Multiple myeloma   . Aortic regurgitation   . HTN (hypertension)   . Hot flash not due to menopause 12/23/2011  . Neuropathy 12/23/2011  . DCIS (ductal carcinoma in situ) 08/04/2012    Past Surgical History  Procedure Laterality Date  . Lymph node excision - left groin    . Incisional hernia repair      Current Outpatient Prescriptions  Medication Sig Dispense Refill  . aspirin 325 MG EC tablet Take 325 mg by mouth daily.      [name] dexamethasone (DECADRON) 4 MG tablet Take 5 tablets (20 mg total) by mouth once a week.  60 tablet  1  . gabapentin (NEURONTIN) 100 MG capsule 2-3 tabs po qhs      . lenalidomide (REVLIMID) 10 MG capsule Take 1 capsule (10 mg total) by mouth daily. Take for 21 days then take 7 days off.  21 capsule  0  . levothyroxine (SYNTHROID, LEVOTHROID) 50 MCG tablet Take 50 mcg by mouth daily.      [name] loperamide (IMODIUM A-D) 2 MG tablet Take 2 mg by mouth as needed for diarrhea or loose stools (as directed.).      [name] losartan (COZAAR) 25 MG tablet Take 1 tablet (25 mg total) by mouth daily.  90 tablet  3  . meloxicam (MOBIC) 7.5 MG tablet Take 7.5 mg by mouth 2 (two) times daily  as needed.       [name] VITAMIN D-VITAMIN K PO Take 1 tablet by mouth daily. With Calcium 500 mg       No current facility-administered medications for this visit.    ALLERGIES:  has No Known Allergies.  REVIEW OF SYSTEMS:  The rest of the 14-point review of system was negative.   Filed Vitals:   01/25/13 1438  BP: 137/58  Pulse: 54  Temp: 98.1 F (36.7 C)  Resp: 18   Wt Readings from Last 3 Encounters:  01/25/13 132 lb 12.8 oz (60.238 kg)  11/25/12 135 lb 1.6 oz (61.281 kg)  10/31/12 134 lb 9.6 oz (61.054 kg)   ECOG Performance status: 0  PHYSICAL EXAMINATION:   General:  Thin-appearing  woman, in no acute distress.  Eyes:  no scleral icterus.  ENT:  There were no oropharyngeal lesions.  Neck was without thyromegaly.  Lymphatics:  Negative cervical, supraclavicular or axillary adenopathy.  Respiratory: lungs were clear bilaterally without wheezing or crackles.  Cardiovascular:  Regular rate and rhythm, S1/S2, without murmur, rub or gallop.  There was no pedal edema.  GI:  abdomen was soft, flat, nontender, nondistended, without organomegaly.  Muscoloskeletal:  no spinal tenderness of palpation of vertebral spine.  There was mild discomfort to palpation of left upper back by the scapula without erythema, skin rash, palpable mass.  Skin exam was without echymosis, petichae.  Neuro exam was nonfocal.  Patient was able to get on and off exam table without assistance.  Gait was normal.  Patient was alert and oriented.  Attention was good.   Language was appropriate.  Mood was normal without depression.  Speech was not pressured.  Thought content was not tangential.    LABORATORY/RADIOLOGY DATA:  Lab Results  Component Value Date   WBC 4.6 01/25/2013   HGB 10.9* 01/25/2013   HCT 32.5* 01/25/2013   PLT 230 01/25/2013   GLUCOSE 77 01/25/2013   ALKPHOS 61 01/25/2013   ALT 21 01/25/2013   AST 21 01/25/2013   NA 136 01/25/2013   K 3.6 01/25/2013   CL 103 11/25/2012   CREATININE 0.8 01/25/2013   BUN 19.4 01/25/2013   CO2 26 01/25/2013      ASSESSMENT AND PLAN:   1. Relapsed multiple myeloma: she has grade 1 fatigue, grade 1 diarrhea, grade 1 leukopenia and thrombocytopenia.  She has been having response with decreasing M-spike.  I recommended decreasing Decadron from 20mg  to 15 or 10mg .  However, she declined dose change for now.  She prefers to wait on her M spike results which return next week. If there is a decrease in the M spike, and she will will consider decreasing the dexamethasone dose. Will continue the next cycle of Revlimid/Dex without dose change.  2. History of bradycardia:  No  longer symptomatic.  She was seen by Dr. Eden Emms with no further evaluation or intervention indicated.  3.  Neuropathy: On gabapentin per PCP.  This is most likely not due to Revlimid since it predated Revlimid and has not worsened with resumption of Revlimid this time. She inquired about other medications to treat her neuropathy. We discussed increasing the dose of Neurontin versus changing to Lyrica versus changing to Cymbalta. For now, the patient prefers to stay on Neurontin until the results of her M spike return. 4. Hypothyroidism: She is on synthroid per PCP. She requested a TSH today due to her fatigue. 5. History of ductal carcinoma in situ: On observation. Her next mammogram is  due in April 2015; her last mammogram in 10/2012 was negative.  6. Transient diarrhea:  Due to most likely Revlimid.  It is grade 1.  She has OTC Imodium prn.  7. Irritability: Likely due to her dexamethasone. We discussed decreasing the dose, but again she prefers to wait until her M spike returns. We discussed adding on an antidepressant such as Zoloft or Cymbalta and she will consider this pending the results of her lab work. 8. Follow up:  In 2 months.      The length of time of the face-to-face encounter was 30 minutes. More than 50% of time was spent counseling and coordination of care.

## 2013-01-25 NOTE — Telephone Encounter (Signed)
Informed pt of TSH wnl and to continue to take same dose of synthroid.  She verbalized understanding.

## 2013-01-27 LAB — PROTEIN ELECTROPHORESIS, SERUM
Albumin ELP: 61.6 % (ref 55.8–66.1)
Alpha-1-Globulin: 4.3 % (ref 2.9–4.9)
Alpha-2-Globulin: 12.4 % — ABNORMAL HIGH (ref 7.1–11.8)
Beta Globulin: 6.8 % (ref 4.7–7.2)
Total Protein, Serum Electrophoresis: 5.5 g/dL — ABNORMAL LOW (ref 6.0–8.3)

## 2013-02-03 ENCOUNTER — Telehealth: Payer: Self-pay | Admitting: *Deleted

## 2013-02-03 NOTE — Telephone Encounter (Signed)
Pt left VM states she was given Rx for Clarithromycin for Respiratory infection.  She wants to make sure this does not interact w/ Revlimid.  Searched for interactions on [email or web address] and no interactions found between these two medications.  Attempted to call pt back and got busy signal on multiple attempts.

## 2013-02-08 ENCOUNTER — Other Ambulatory Visit: Payer: Self-pay

## 2013-02-20 ENCOUNTER — Telehealth: Payer: Self-pay | Admitting: *Deleted

## 2013-02-20 DIAGNOSIS — C9000 Multiple myeloma not having achieved remission: Secondary | ICD-10-CM

## 2013-02-20 MED ORDER — LENALIDOMIDE 10 MG PO CAPS: 10.0000 mg | ORAL_CAPSULE | Freq: Every day | ORAL | Status: DC

## 2013-02-20 NOTE — Telephone Encounter (Signed)
Pt left VM this morning states has not got Revlimid yet and due to restart tomorrow.  Rx signed and sent to [facility] for refill.  Notified pt.Natasha Jackson

## 2013-02-28 ENCOUNTER — Telehealth: Payer: Self-pay | Admitting: *Deleted

## 2013-02-28 ENCOUNTER — Other Ambulatory Visit: Payer: Self-pay | Admitting: Oncology

## 2013-02-28 DIAGNOSIS — D0510 Intraductal carcinoma in situ of unspecified breast: Secondary | ICD-10-CM

## 2013-02-28 NOTE — Telephone Encounter (Signed)
I have requested a left breast diagnostic mammogram within 1 week.

## 2013-02-28 NOTE — Telephone Encounter (Signed)
Informed pt of mammogram ordered.  She verbalized understanding.

## 2013-02-28 NOTE — Telephone Encounter (Signed)
Pt reports tender spot under left breast, no palpable lump.  States tender if she presses hard on spot, but usually only hurts when she puts on her bra or lays down flat at night.  It has been tender for a little over one month now. [facility] but they told her it was too soon,  She just had Mammogram in Feb this year.  Requesting order/referral for mammogram at [facility].

## 2013-03-01 ENCOUNTER — Other Ambulatory Visit: Payer: Self-pay | Admitting: Oncology

## 2013-03-01 ENCOUNTER — Telehealth: Payer: Self-pay | Admitting: Oncology

## 2013-03-01 DIAGNOSIS — N63 Unspecified lump in unspecified breast: Secondary | ICD-10-CM

## 2013-03-01 NOTE — Telephone Encounter (Signed)
s.w. pt and advised on 9.2.14 mammo at [facility] @ 1:30

## 2013-03-03 ENCOUNTER — Other Ambulatory Visit: Payer: Self-pay

## 2013-03-03 DIAGNOSIS — C9000 Multiple myeloma not having achieved remission: Secondary | ICD-10-CM

## 2013-03-03 MED ORDER — DEXAMETHASONE 4 MG PO TABS: 20.0000 mg | ORAL_TABLET | ORAL | Status: DC

## 2013-03-03 NOTE — Telephone Encounter (Signed)
Called pt to let her know her dexamethasone is refilled.

## 2013-03-07 ENCOUNTER — Other Ambulatory Visit: Payer: Medicare Other

## 2013-03-08 ENCOUNTER — Ambulatory Visit
Admission: RE | Admit: 2013-03-08 | Discharge: 2013-03-08 | Disposition: A | Discharge status: Home / Self Care | Payer: Medicare Other | Source: Ambulatory Visit | Attending: Oncology | Admitting: Oncology

## 2013-03-08 DIAGNOSIS — N63 Unspecified lump in unspecified breast: Secondary | ICD-10-CM

## 2013-03-08 DIAGNOSIS — D0510 Intraductal carcinoma in situ of unspecified breast: Secondary | ICD-10-CM

## 2013-03-15 ENCOUNTER — Other Ambulatory Visit: Payer: Self-pay | Admitting: *Deleted

## 2013-03-15 DIAGNOSIS — C9000 Multiple myeloma not having achieved remission: Secondary | ICD-10-CM

## 2013-03-15 MED ORDER — LENALIDOMIDE 10 MG PO CAPS: 10.0000 mg | ORAL_CAPSULE | Freq: Every day | ORAL | Status: DC

## 2013-03-21 ENCOUNTER — Other Ambulatory Visit: Payer: Medicare Other

## 2013-03-24 ENCOUNTER — Telehealth: Payer: Self-pay | Admitting: *Deleted

## 2013-03-24 NOTE — Telephone Encounter (Signed)
sw pt gv appt for 03/29/13 w/labs@ 2:15pm and ov@ 2:45pm. Pt is aware...td

## 2013-03-29 ENCOUNTER — Telehealth: Payer: Self-pay | Admitting: Hematology and Oncology

## 2013-03-29 ENCOUNTER — Ambulatory Visit (HOSPITAL_BASED_OUTPATIENT_CLINIC_OR_DEPARTMENT_OTHER): Payer: Medicare Other | Admitting: Hematology and Oncology

## 2013-03-29 ENCOUNTER — Other Ambulatory Visit (HOSPITAL_BASED_OUTPATIENT_CLINIC_OR_DEPARTMENT_OTHER): Payer: Medicare Other

## 2013-03-29 ENCOUNTER — Encounter: Payer: Self-pay | Admitting: Hematology and Oncology

## 2013-03-29 VITALS — BP 139/57 | HR 57 | Temp 97.1°F | Resp 18 | Ht 64.0 in | Wt 130.2 lb

## 2013-03-29 DIAGNOSIS — C9000 Multiple myeloma not having achieved remission: Secondary | ICD-10-CM

## 2013-03-29 LAB — CBC WITH DIFFERENTIAL/PLATELET
BASO%: 1.9 % (ref 0.0–2.0)
EOS%: 1.8 % (ref 0.0–7.0)
HGB: 11.2 g/dL — ABNORMAL LOW (ref 11.6–15.9)
MCH: 33.4 pg (ref 25.1–34.0)
MCHC: 33.8 g/dL (ref 31.5–36.0)
NEUT#: 3.1 10*3/uL (ref 1.5–6.5)
NEUT%: 70.7 % (ref 38.4–76.8)
WBC: 4.3 10*3/uL (ref 3.9–10.3)
lymph#: 0.7 10*3/uL — ABNORMAL LOW (ref 0.9–3.3)

## 2013-03-29 LAB — COMPREHENSIVE METABOLIC PANEL (CC13)
Alkaline Phosphatase: 64 U/L (ref 40–150)
BUN: 16.7 mg/dL (ref 7.0–26.0)
Creatinine: 0.8 mg/dL (ref 0.6–1.1)
Glucose: 101 mg/dl (ref 70–140)
Sodium: 137 mEq/L (ref 136–145)
Total Bilirubin: 0.61 mg/dL (ref 0.20–1.20)

## 2013-03-29 MED ORDER — VITAMIN D 1000 UNITS PO TABS: 1000.0000 [IU] | ORAL_TABLET | Freq: Every day | ORAL | Status: DC

## 2013-03-29 NOTE — Progress Notes (Signed)
[facility] OFFICE PROGRESS NOTE  Charlton Haws, MD [phone] [address] [place] [address]  DIAGNOSIS: History of right breast DCIS status post lumpectomy, radiation therapy and endocrine therapy and multiple myeloma undergoing chemotherapy  SUMMARY OF ONCOLOGIC HISTORY: 1. 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 She finished adjuvant radiation therapy May 25, 2008. She was on adjuvant tamoxifen from November 2009 to June 2010, which was discontinued due to intolerance of hot flash/night sweat and new diagnosis of myeloma.  2. 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  CURRENT THERAPY:  1. For history of DCIS, she is on watchful observation.  2. For multiple myeloma, she was started on Revlimid/melphalan/dexamethasone. She had grade 3 pancytopenia. Therefore since the second cycle she has been receiving Revlimid day 1, through 21 every 28 day and dexamethasone.   INTERVAL HISTORY: Natasha Jackson 79 y.o. female returns for routine followup returning to her history of multiple myeloma. The patient is currently on 10 mg days 1-21, rest 7days, and weekly dexamethasone at 20 mg she takes on Sunday evening. She has not been given IV Zometa for some time. She complained of intermittent left chest wall discomfort radiating to her back. She had a mammogram evaluation done recently and diagnostic ultrasound is unrevealing. According to her at a rate pain is persistent but  she does not take any pain medication for that. He radiates to the back pressure reading at about 2-3/10 pain. Approximately a month ago, she was diagnosed with upper respiratory tract infection was placed on an inhaler and clarithromycin and cause altered taste sensation. It has not improved her cough is getting better. She usually get diarrhea after dexamethasone 2 days later associated with cramps. She has occasional mild hot flashes. She has not lost any weight recently. No recent bleeding.  I have reviewed the past medical history, past surgical history, social history and family history with the patient and they are unchanged from previous note.  ALLERGIES:  has No Known Allergies.  MEDICATIONS: has a current medication list which includes the following prescription(s): aspirin, dexamethasone, gabapentin, lenalidomide, levothyroxine, loperamide, losartan, meloxicam, vitamin d-vitamin k, and cholecalciferol.  REVIEW OF SYSTEMS:   Constitutional: Denies fevers, chills or abnormal weight loss Eyes: Denies blurriness of vision Ears, nose, mouth, throat, and face: Denies mucositis or sore throat Respiratory: Denies cough, dyspnea or wheezes Cardiovascular: Denies palpitation, chest discomfort or lower extremity swelling Gastrointestinal:  Denies nausea, heartburn or change in bowel habits Skin: Denies abnormal skin rashes Lymphatics: Denies new lymphadenopathy or easy bruising Neurological:Denies numbness, tingling or new weaknesses Behavioral/Psych: Mood is stable, no new changes  All other systems were reviewed with the patient and are negative.  PHYSICAL EXAMINATION: ECOG PERFORMANCE STATUS: 1 - Symptomatic but completely ambulatory  Filed Vitals:   03/29/13 1439  BP: 139/57  Pulse: 57  Temp: 97.1 F (36.2 C)  Resp: 18    GENERAL:alert, no distress and comfortable SKIN: skin color, texture, turgor are normal, no rashes  or significant lesions EYES: normal, Conjunctiva are pink  and non-injected, sclera clear OROPHARYNX:no exudate, no erythema and lips, buccal mucosa, and tongue normal  NECK: supple, thyroid normal size, non-tender, without nodularity LYMPH:  no palpable lymphadenopathy in the cervical, axillary or inguinal LUNGS: clear to auscultation and percussion with normal breathing effort HEART: regular rate & rhythm and no murmurs and no lower extremity edema ABDOMEN:abdomen soft, non-tender and normal bowel sounds Musculoskeletal:no cyanosis of digits and no clubbing. Chest wall examination did not reveal any lumps or tenderness on palpation  NEURO: alert & oriented x 3 with fluent speech, no focal motor/sensory deficits  LABORATORY DATA:  I have reviewed the data as listed    Component Value Date/Time   NA 137 03/29/2013 1422   NA 137 01/29/2012 1513   K 3.3* 03/29/2013 1422   K 4.5 01/29/2012 1513   CL 103 11/25/2012 1328   CL 102 01/29/2012 1513   CO2 25 03/29/2013 1422   CO2 29 01/29/2012 1513   GLUCOSE 101 03/29/2013 1422   GLUCOSE 91 11/25/2012 1328   GLUCOSE 81 01/29/2012 1513   BUN 16.7 03/29/2013 1422   BUN 20 01/29/2012 1513   CREATININE 0.8 03/29/2013 1422   CREATININE 0.76 01/29/2012 1513   CALCIUM 8.5 03/29/2013 1422   CALCIUM 9.0 01/29/2012 1513   PROT 5.9* 03/29/2013 1422   PROT 6.1 01/29/2012 1513   ALBUMIN 3.3* 03/29/2013 1422   ALBUMIN 3.5 01/29/2012 1513   AST 17 03/29/2013 1422   AST 25 01/29/2012 1513   ALT 14 03/29/2013 1422   ALT 16 01/29/2012 1513   ALKPHOS 64 03/29/2013 1422   ALKPHOS 68 01/29/2012 1513   BILITOT 0.61 03/29/2013 1422   BILITOT 0.3 01/29/2012 1513    I No results found for this basename: SPEP, UPEP,  kappa and lambda light chains    Lab Results  Component Value Date   WBC 4.3 03/29/2013   NEUTROABS 3.1 03/29/2013   HGB 11.2* 03/29/2013   HCT 33.0* 03/29/2013   MCV 98.8 03/29/2013   PLT 199 03/29/2013      Chemistry      Component Value Date/Time   NA 137 03/29/2013 1422   NA 137 01/29/2012 1513   K 3.3* 03/29/2013  1422   K 4.5 01/29/2012 1513   CL 103 11/25/2012 1328   CL 102 01/29/2012 1513   CO2 25 03/29/2013 1422   CO2 29 01/29/2012 1513   BUN 16.7 03/29/2013 1422   BUN 20 01/29/2012 1513   CREATININE 0.8 03/29/2013 1422   CREATININE 0.76 01/29/2012 1513      Component Value Date/Time   CALCIUM 8.5 03/29/2013 1422   CALCIUM 9.0 01/29/2012 1513   ALKPHOS 64 03/29/2013 1422   ALKPHOS 68 01/29/2012 1513   AST 17 03/29/2013 1422   AST 25 01/29/2012 1513   ALT 14 03/29/2013 1422   ALT 16 01/29/2012 1513   BILITOT 0.61 03/29/2013 1422   BILITOT 0.3 01/29/2012 1513     RADIOGRAPHIC STUDIES: I have reviewed the results of her recent mammogram which was reported as negative  ASSESSMENT: Multiple myeloma, on dexamethasone and history of DCIS on observation   PLAN:  #1 multiple myeloma Her last myeloma panel showed that her M spike is improving. She is tolerating Revlimid well. Her NCCN guidelines, IV Zometa should be given along with her Revlimid for treatment of multiple myeloma. I recommended she get a dental evaluation over the next few weeks. On the next return visit, I  will schedule Zometa to be given on the same day after she sees me. I recommend to reduce the dexamethasone by one pill every week. In the future, I hope we can get away with minimum amount of dexamethasone as she's not tolerating it well. I also recommended she take dexamethasone in the morning instead of evening. #2 history of DCIS Her most recent mammogram was negative #3 musculoskeletal pain She could have a possible hairline fracture. I recommend increase vitamin D supplements. I would order a skeletal survey to be done before her next visit. #4 Preventive care Should have received influenza vaccination recently #5 hypokalemia This is likely due to recent diarrhea. She is taking Imodium as needed. She felt that her diarrhea is related to dexamethasone. Hopefully by reducing the dose, diarrhea might improve #6 altered taste  sensation This could be residual effect from her recent antibiotic therapy and her upper respiratory tract infection. We will observe for now.  All questions were answered. The patient knows to call the clinic with any problems, questions or concerns. We can certainly see the patient much sooner if necessary. No barriers to learning was detected.   [facility], Eviana Sibilia, MD 03/29/2013 3:16 PM

## 2013-03-29 NOTE — Telephone Encounter (Signed)
gv and pritned appt sched and avs for pt for OCT °

## 2013-03-30 ENCOUNTER — Telehealth: Payer: Self-pay | Admitting: *Deleted

## 2013-03-30 LAB — KAPPA/LAMBDA LIGHT CHAINS: Kappa free light chain: 1.46 mg/dL (ref 0.33–1.94)

## 2013-03-30 NOTE — Telephone Encounter (Signed)
Per staff message and POF I have scheduled appts.  JMW  

## 2013-04-01 ENCOUNTER — Other Ambulatory Visit: Payer: Self-pay | Admitting: Oncology

## 2013-04-03 LAB — PROTEIN ELECTROPHORESIS, SERUM
Alpha-1-Globulin: 6 % — ABNORMAL HIGH (ref 2.9–4.9)
Gamma Globulin: 9.6 % — ABNORMAL LOW (ref 11.1–18.8)
M-Spike, %: 0.27 g/dL

## 2013-04-04 ENCOUNTER — Telehealth: Payer: Self-pay

## 2013-04-04 NOTE — Telephone Encounter (Signed)
Told Ms. Natasha Jackson that her M-spike and light chain levels look good from 03-29-13 per Dr. Bertis Ruddy. Keep appointment as scheduled on 04-26-13.

## 2013-04-10 ENCOUNTER — Telehealth: Payer: Self-pay | Admitting: *Deleted

## 2013-04-10 NOTE — Telephone Encounter (Signed)
Informed pt being on chemo and steroid can increase risk of infections.  She can try pyridium otc for UTI symptoms per Dr. Bertis Ruddy, her symptoms may not be UTI and follow w/ PCP for symptoms. Pt states has appt w/ PCP tomorrow and she is taking pyridium for the discomfort.

## 2013-04-10 NOTE — Telephone Encounter (Signed)
Message copied by Wende Mott on Mon Apr 10, 2013  2:47 PM ------      Message from: [facility], [place]      Created: Mon Apr 10, 2013  2:09 PM      Regarding: UTI symptoms       To answer the question about UTI symptoms, not directly related to dexamethasone. But any chemo patients can get infections.            Sometimes, a symptom like UTI may not necessary be UTI but could be due to vaginal irritation or just discomfort. She can get pyridium OTC and try that ------

## 2013-04-10 NOTE — Telephone Encounter (Signed)
Pt left VM to report she has recurrent UTI symptoms.  Completed course of Nitrofurantoin 2 days ago.  Her symptoms resolved but have now returned.  She has a call into her PCP to report symptoms.  She wants Dr. Bertis Ruddy to be aware and asks if UTI can be "side effect" from the dexamethasone and if Dr. Bertis Ruddy has any instructions?

## 2013-04-19 ENCOUNTER — Ambulatory Visit (HOSPITAL_COMMUNITY)
Admission: RE | Admit: 2013-04-19 | Discharge: 2013-04-19 | Disposition: A | Discharge status: Home / Self Care | Payer: Medicare Other | Source: Ambulatory Visit | Attending: Hematology and Oncology | Admitting: Hematology and Oncology

## 2013-04-19 ENCOUNTER — Other Ambulatory Visit: Payer: Self-pay | Admitting: *Deleted

## 2013-04-19 ENCOUNTER — Other Ambulatory Visit (HOSPITAL_BASED_OUTPATIENT_CLINIC_OR_DEPARTMENT_OTHER): Payer: Medicare Other | Admitting: Lab

## 2013-04-19 DIAGNOSIS — M899 Disorder of bone, unspecified: Secondary | ICD-10-CM | POA: Insufficient documentation

## 2013-04-19 DIAGNOSIS — C50919 Malignant neoplasm of unspecified site of unspecified female breast: Secondary | ICD-10-CM

## 2013-04-19 DIAGNOSIS — M5030 Other cervical disc degeneration, unspecified cervical region: Secondary | ICD-10-CM | POA: Insufficient documentation

## 2013-04-19 DIAGNOSIS — I517 Cardiomegaly: Secondary | ICD-10-CM | POA: Insufficient documentation

## 2013-04-19 DIAGNOSIS — C9000 Multiple myeloma not having achieved remission: Secondary | ICD-10-CM

## 2013-04-19 DIAGNOSIS — R079 Chest pain, unspecified: Secondary | ICD-10-CM | POA: Insufficient documentation

## 2013-04-19 LAB — CBC WITH DIFFERENTIAL/PLATELET
BASO%: 2.4 % — ABNORMAL HIGH (ref 0.0–2.0)
Eosinophils Absolute: 0.1 10*3/uL (ref 0.0–0.5)
MCHC: 33.1 g/dL (ref 31.5–36.0)
MONO#: 0.7 10*3/uL (ref 0.1–0.9)
MONO%: 16.3 % — ABNORMAL HIGH (ref 0.0–14.0)
NEUT#: 2.2 10*3/uL (ref 1.5–6.5)
RBC: 3.19 10*6/uL — ABNORMAL LOW (ref 3.70–5.45)
RDW: 17.1 % — ABNORMAL HIGH (ref 11.2–14.5)
WBC: 4.2 10*3/uL (ref 3.9–10.3)

## 2013-04-19 LAB — COMPREHENSIVE METABOLIC PANEL (CC13)
Albumin: 3.1 g/dL — ABNORMAL LOW (ref 3.5–5.0)
BUN: 21 mg/dL (ref 7.0–26.0)
CO2: 24 mEq/L (ref 22–29)
Calcium: 8.8 mg/dL (ref 8.4–10.4)
Chloride: 105 mEq/L (ref 98–109)
Glucose: 87 mg/dl (ref 70–140)
Potassium: 3.7 mEq/L (ref 3.5–5.1)

## 2013-04-19 MED ORDER — LENALIDOMIDE 10 MG PO CAPS: 10.0000 mg | ORAL_CAPSULE | Freq: Every day | ORAL | Status: DC

## 2013-04-19 NOTE — Telephone Encounter (Signed)
Notified pt of Revlimid refill faxed to [facility].  She verbalized understanding.

## 2013-04-21 LAB — SPEP & IFE WITH QIG
Alpha-1-Globulin: 4.7 % (ref 2.9–4.9)
Alpha-2-Globulin: 11.7 % (ref 7.1–11.8)
Beta Globulin: 6.5 % (ref 4.7–7.2)
IgG (Immunoglobin G), Serum: 517 mg/dL — ABNORMAL LOW (ref 690–1700)
M-Spike, %: 0.25 g/dL
Total Protein, Serum Electrophoresis: 5.5 g/dL — ABNORMAL LOW (ref 6.0–8.3)

## 2013-04-21 LAB — BETA 2 MICROGLOBULIN, SERUM: Beta-2 Microglobulin: 1.49 mg/L (ref 1.01–1.73)

## 2013-04-21 LAB — KAPPA/LAMBDA LIGHT CHAINS: Kappa free light chain: 1.5 mg/dL (ref 0.33–1.94)

## 2013-04-26 ENCOUNTER — Telehealth: Payer: Self-pay | Admitting: *Deleted

## 2013-04-26 ENCOUNTER — Ambulatory Visit (HOSPITAL_BASED_OUTPATIENT_CLINIC_OR_DEPARTMENT_OTHER): Payer: Medicare Other | Admitting: Hematology and Oncology

## 2013-04-26 ENCOUNTER — Ambulatory Visit: Payer: BC Managed Care – PPO

## 2013-04-26 ENCOUNTER — Ambulatory Visit: Payer: BC Managed Care – PPO | Admitting: Lab

## 2013-04-26 VITALS — BP 140/60 | HR 61 | Temp 97.0°F | Resp 20 | Ht 64.0 in | Wt 130.7 lb

## 2013-04-26 DIAGNOSIS — Z853 Personal history of malignant neoplasm of breast: Secondary | ICD-10-CM

## 2013-04-26 DIAGNOSIS — C9000 Multiple myeloma not having achieved remission: Secondary | ICD-10-CM

## 2013-04-26 DIAGNOSIS — R232 Flushing: Secondary | ICD-10-CM

## 2013-04-26 DIAGNOSIS — IMO0001 Reserved for inherently not codable concepts without codable children: Secondary | ICD-10-CM

## 2013-04-26 MED ORDER — CITALOPRAM HYDROBROMIDE 20 MG PO TABS
20.0000 mg | ORAL_TABLET | Freq: Every day | ORAL | Status: DC
Start: 2013-04-26 — End: 2013-05-01

## 2013-04-26 MED ORDER — VITAMIN D 1000 UNITS PO TABS: 1000.0000 [IU] | ORAL_TABLET | Freq: Every day | ORAL | Status: DC

## 2013-04-26 NOTE — Progress Notes (Signed)
[facility] OFFICE PROGRESS NOTE  Patient Care Team: Wendall Stade, MD as PCP - General (Cardiology) Thayer Headings, MD (Internal Medicine) Gaylord Shih, MD as Attending Physician (Cardiology)  DIAGNOSIS: History of right breast DCIS status post lumpectomy, radiation therapy and endocrine therapy and multiple myeloma undergoing chemotherapy  SUMMARY OF ONCOLOGIC HISTORY: 1. 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 She finished adjuvant radiation therapy May 25, 2008. She was on adjuvant tamoxifen from November 2009 to June 2010, which was discontinued due to intolerance of hot flash/night sweat and new diagnosis of myeloma.  2. 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  CURRENT THERAPY:  1. For history of DCIS, she is on watchful observation.  2. For multiple myeloma, she was started on Revlimid/melphalan/dexamethasone. She had grade 3 pancytopenia. Therefore since the second cycle she has been receiving Revlimid day 1, through 21 every 28 day and dexamethasone. The patient had been on current treatment for 4 years.  INTERVAL HISTORY: Natasha Jackson 79 y.o. female returns for further followup. She continued to complain of intermittent hot flashes. She would like to take something for this. She denies any recent back pain. Denies any probable lumps or bumps. She denies any recent fever, chills, night sweats or abnormal weight loss  I have reviewed the past medical  history, past surgical history, social history and family history with the patient and they are unchanged from previous note.  ALLERGIES:  has No Known Allergies.  MEDICATIONS:  Current Outpatient Prescriptions  Medication Sig Dispense Refill  . aspirin 325 MG EC tablet Take 325 mg by mouth daily.      [name] dexamethasone (DECADRON) 4 MG tablet Take 4 mg by mouth once a week.      . gabapentin (NEURONTIN) 100 MG capsule 2-3 tabs po qhs      . lenalidomide (REVLIMID) 10 MG capsule Take 1 capsule (10 mg total) by mouth daily. Take for 21 days then take 7 days off.  21 capsule  0  . levothyroxine (SYNTHROID, LEVOTHROID) 50 MCG tablet Take 50 mcg by mouth daily.      [name] loperamide (IMODIUM A-D) 2 MG tablet Take 2 mg by mouth as needed for diarrhea or loose stools (as directed.).      [name] losartan (COZAAR) 25 MG tablet Take 1 tablet (25 mg total) by mouth daily.  90 tablet  3  . meloxicam (MOBIC) 7.5 MG tablet Take 7.5 mg by mouth 2 (two) times daily as needed.       [name] VITAMIN D-VITAMIN K PO Take 1 tablet by mouth daily. With Calcium 500 mg      . cholecalciferol (VITAMIN D) 1000 UNITS tablet Take 1 tablet (1,000 Units total) by mouth daily.  90 tablet  3  . citalopram (CELEXA) 20 MG tablet Take 1 tablet (20 mg total) by mouth daily.  30 tablet  3   No current facility-administered medications for this visit.    REVIEW OF SYSTEMS:  Constitutional: Denies fevers, chills or abnormal weight loss Eyes: Denies blurriness of vision Ears, nose, mouth, throat, and face: Denies mucositis or sore throat Respiratory: Denies cough, dyspnea or wheezes Cardiovascular: Denies palpitation, chest discomfort or lower extremity swelling Gastrointestinal:  Denies nausea, heartburn or change in bowel habits Skin: Denies abnormal skin rashes Lymphatics: Denies new lymphadenopathy or easy bruising Neurological:Denies numbness, tingling or new weaknesses Behavioral/Psych: Mood is stable, no new changes  All other  systems were reviewed with the patient and are negative.  PHYSICAL EXAMINATION: ECOG PERFORMANCE STATUS: 0 - Asymptomatic  Filed Vitals:   04/26/13 1357  BP: 140/60  Pulse: 61  Temp: 97 F (36.1 C)  Resp: 20   Filed Weights   04/26/13 1357  Weight: 130 lb 11.2 oz (59.285 kg)    GENERAL:alert, no distress and comfortable. Patient and look thin but not cachectic. SKIN: skin color, texture, turgor are normal, no rashes or significant lesions EYES: normal, Conjunctiva are pink and non-injected, sclera clear OROPHARYNX:no exudate, no erythema and lips, buccal mucosa, and tongue normal  NECK: supple, thyroid normal size, non-tender, without nodularity LYMPH:  no palpable lymphadenopathy in the cervical, axillary or inguinal LUNGS: clear to auscultation and percussion with normal breathing effort HEART: regular rate & rhythm and no murmurs and no lower extremity edema ABDOMEN:abdomen soft, non-tender and normal bowel sounds Musculoskeletal:no cyanosis of digits and no clubbing  NEURO: alert & oriented x 3 with fluent speech, no focal motor/sensory deficits  LABORATORY DATA:  I have reviewed the data as listed    Component Value Date/Time   NA 136 04/19/2013 1431   NA 137 01/29/2012 1513   K 3.7 04/19/2013 1431   K 4.5 01/29/2012 1513   CL 103 11/25/2012 1328   CL 102 01/29/2012 1513   CO2 24 04/19/2013 1431   CO2 29 01/29/2012 1513   GLUCOSE 87 04/19/2013 1431   GLUCOSE 91 11/25/2012 1328   GLUCOSE 81 01/29/2012 1513   BUN 21.0 04/19/2013 1431   BUN 20 01/29/2012 1513   CREATININE 0.8 04/19/2013 1431   CREATININE 0.76 01/29/2012 1513   CALCIUM 8.8 04/19/2013 1431   CALCIUM 9.0 01/29/2012 1513   PROT 5.8* 04/19/2013 1431   PROT 6.1 01/29/2012 1513   ALBUMIN 3.1* 04/19/2013 1431   ALBUMIN 3.5 01/29/2012 1513   AST 18 04/19/2013 1431   AST 25 01/29/2012 1513   ALT 15 04/19/2013 1431   ALT 16 01/29/2012 1513   ALKPHOS 55 04/19/2013 1431   ALKPHOS 68 01/29/2012 1513   BILITOT 0.47  04/19/2013 1431   BILITOT 0.3 01/29/2012 1513    No results found for this basename: SPEP, UPEP,  kappa and lambda light chains    Lab Results  Component Value Date   WBC 4.2 04/19/2013   NEUTROABS 2.2 04/19/2013   HGB 10.4* 04/19/2013   HCT 31.6* 04/19/2013   MCV 98.8 04/19/2013   PLT 231 04/19/2013      Chemistry      Component Value Date/Time   NA 136 04/19/2013 1431   NA 137 01/29/2012 1513   K 3.7 04/19/2013 1431   K 4.5 01/29/2012 1513   CL 103 11/25/2012 1328   CL 102 01/29/2012 1513   CO2 24 04/19/2013 1431   CO2 29 01/29/2012 1513   BUN 21.0 04/19/2013 1431   BUN 20 01/29/2012 1513   CREATININE 0.8 04/19/2013 1431   CREATININE 0.76 01/29/2012 1513      Component Value Date/Time   CALCIUM 8.8 04/19/2013 1431  CALCIUM 9.0 01/29/2012 1513   ALKPHOS 55 04/19/2013 1431   ALKPHOS 68 01/29/2012 1513   AST 18 04/19/2013 1431   AST 25 01/29/2012 1513   ALT 15 04/19/2013 1431   ALT 16 01/29/2012 1513   BILITOT 0.47 04/19/2013 1431   BILITOT 0.3 01/29/2012 1513     RADIOGRAPHIC STUDIES: I have personally reviewed the radiological images as listed and agreed with the findings in the report. Skeletal survey showed no evidence of fracture or new disease  ASSESSMENT:  #1 multiple myeloma #2 hot flashes  PLAN:  #1 multiple myeloma The patient has been on Revlimid for 4 years. She had not had a bone marrow biopsy repeated. I did not find a 24-hour urine collection either. Or a 24-hour urine collection for light chains. I'm going to order a bone marrow aspirate and biopsy to assess her response to myeloma. The patient would like to procedure to be done under sedation. Her blood work suggests that she has achieved very good partial response. At that week should probably hold her treatment until further notice. She is in agreement. #2 hot flashes I recommend that trial off citalopram to treat her hot flashes. #3 preventive care I recommend calcium and vitamin D supplements. I  gave her prescription of vitamin D to take. #4 musculoskeletal pain A skeletal survey showed no evidence of new fracture. We will observe.  Orders Placed This Encounter  Procedures  . Immunofixation interpretive, urine    Standing Status: Future     Number of Occurrences:      Standing Expiration Date: 04/26/2014  . Protein Electro, 24-Hour Urine    Standing Status: Future     Number of Occurrences:      Standing Expiration Date: 04/26/2014  . IFE, Urine (with Tot Prot)    Standing Status: Future     Number of Occurrences:      Standing Expiration Date: 04/26/2014   All questions were answered. The patient knows to call the clinic with any problems, questions or concerns. No barriers to learning was detected.    [facility], Landon Bassford, MD 04/26/2013 2:25 PM

## 2013-04-26 NOTE — Telephone Encounter (Signed)
appts made and printed...td 

## 2013-04-27 ENCOUNTER — Telehealth: Payer: Self-pay | Admitting: *Deleted

## 2013-04-27 NOTE — Telephone Encounter (Signed)
Pt scheduled for [facility] in Short Stay on Nov 7th at 8am.  She needs to arrive at 7 am.  Notified Lupita Leash in [employer].   Dr. Bertis Ruddy aware.  Left VM for pt to return call to nurse back for appt details, instructions.

## 2013-04-28 NOTE — Telephone Encounter (Signed)
Informed pt of date/time for [facility] on Nov 7,  To arrive at Short Stay at 7 am,  NPO after midnight and need driver home.  She verbalized understanding.

## 2013-05-01 ENCOUNTER — Telehealth: Payer: Self-pay | Admitting: *Deleted

## 2013-05-01 DIAGNOSIS — R232 Flushing: Secondary | ICD-10-CM

## 2013-05-01 DIAGNOSIS — C9000 Multiple myeloma not having achieved remission: Secondary | ICD-10-CM

## 2013-05-01 MED ORDER — CITALOPRAM HYDROBROMIDE 20 MG PO TABS: 20.0000 mg | ORAL_TABLET | Freq: Every day | ORAL | Status: DC

## 2013-05-01 MED ORDER — VITAMIN D 1000 UNITS PO TABS: 1000.0000 [IU] | ORAL_TABLET | Freq: Every day | ORAL | Status: DC

## 2013-05-01 NOTE — Telephone Encounter (Signed)
Pt left VM states she lost the Rx Dr. Bertis Ruddy gave her for Vitamin D and Celexa.  Reordered Rxs electronically to [facility].  Notified pt.

## 2013-05-02 ENCOUNTER — Other Ambulatory Visit: Payer: Self-pay | Admitting: Dermatology

## 2013-05-03 LAB — UIFE/LIGHT CHAINS/TP QN, 24-HR UR
Albumin, U: DETECTED
Free Kappa/Lambda Ratio: 12 ratio — ABNORMAL HIGH (ref 2.04–10.37)
Free Lambda Excretion/Day: 1.69 mg/d
Free Lambda Lt Chains,Ur: 0.13 mg/dL (ref 0.02–0.67)
Free Lt Chn Excr Rate: 20.28 mg/d
Time: 24 hours
Total Protein, Urine-Ur/day: 29 mg/d (ref 10–140)
Total Protein, Urine: 2.2 mg/dL
Volume, Urine: 1300 mL

## 2013-05-03 LAB — UPEP/TP, 24-HR URINE
Collection Interval: 24 hours
Total Protein, Urine: <3 mg/dL
Total Volume, Urine: 1300 mL

## 2013-05-09 ENCOUNTER — Encounter (HOSPITAL_COMMUNITY): Payer: Self-pay | Admitting: Pharmacy Technician

## 2013-05-10 ENCOUNTER — Other Ambulatory Visit: Payer: Self-pay | Admitting: Hematology and Oncology

## 2013-05-10 DIAGNOSIS — C9000 Multiple myeloma not having achieved remission: Secondary | ICD-10-CM

## 2013-05-10 MED ORDER — MIDAZOLAM HCL 10 MG/2ML IJ SOLN
10.0000 mg | Freq: Once | INTRAMUSCULAR | Status: DC
  Filled 2013-05-10: qty 2

## 2013-05-10 MED ORDER — MORPHINE SULFATE 10 MG/ML IJ SOLN: 10.0000 mg | Freq: Once | INTRAMUSCULAR | Status: DC

## 2013-05-11 ENCOUNTER — Other Ambulatory Visit: Payer: Self-pay

## 2013-05-12 ENCOUNTER — Encounter (HOSPITAL_COMMUNITY): Payer: Self-pay

## 2013-05-12 ENCOUNTER — Ambulatory Visit (HOSPITAL_COMMUNITY)
Admission: RE | Admit: 2013-05-12 | Discharge: 2013-05-12 | Disposition: A | Discharge status: Home / Self Care | Payer: Medicare Other | Source: Ambulatory Visit | Attending: Hematology and Oncology | Admitting: Hematology and Oncology

## 2013-05-12 VITALS — BP 150/47 | HR 62 | Temp 97.5°F | Resp 16 | Ht 64.5 in | Wt 130.0 lb

## 2013-05-12 DIAGNOSIS — Z79899 Other long term (current) drug therapy: Secondary | ICD-10-CM | POA: Insufficient documentation

## 2013-05-12 DIAGNOSIS — D72819 Decreased white blood cell count, unspecified: Secondary | ICD-10-CM | POA: Insufficient documentation

## 2013-05-12 DIAGNOSIS — C9000 Multiple myeloma not having achieved remission: Secondary | ICD-10-CM | POA: Insufficient documentation

## 2013-05-12 DIAGNOSIS — D72822 Plasmacytosis: Secondary | ICD-10-CM | POA: Insufficient documentation

## 2013-05-12 DIAGNOSIS — D649 Anemia, unspecified: Secondary | ICD-10-CM | POA: Insufficient documentation

## 2013-05-12 LAB — CBC WITH DIFFERENTIAL/PLATELET
Basophils Absolute: 0 10*3/uL (ref 0.0–0.1)
Eosinophils Absolute: 0.1 10*3/uL (ref 0.0–0.7)
Eosinophils Relative: 4 % (ref 0–5)
Hemoglobin: 11.6 g/dL — ABNORMAL LOW (ref 12.0–15.0)
MCH: 33 pg (ref 26.0–34.0)
MCV: 96.3 fL (ref 78.0–100.0)
Platelets: 197 10*3/uL (ref 150–400)
RDW: 15.7 % — ABNORMAL HIGH (ref 11.5–15.5)

## 2013-05-12 LAB — BONE MARROW EXAM: Bone Marrow Exam: 788

## 2013-05-12 MED ORDER — MIDAZOLAM HCL 5 MG/5ML IJ SOLN
INTRAMUSCULAR | Status: DC | PRN
  Administered 2013-05-12: 2 mg via INTRAVENOUS

## 2013-05-12 MED ORDER — MIDAZOLAM HCL 10 MG/2ML IJ SOLN
10.0000 mg | Freq: Once | INTRAMUSCULAR | Status: DC
Start: 2013-05-12 — End: 2013-05-13
  Filled 2013-05-12: qty 2

## 2013-05-12 MED ORDER — MIDAZOLAM HCL 5 MG/5ML IJ SOLN
INTRAMUSCULAR | Status: AC | PRN
  Administered 2013-05-12: 4 mg via INTRAVENOUS

## 2013-05-12 MED ORDER — SODIUM CHLORIDE 0.9 % IV SOLN
INTRAVENOUS | Status: DC
  Administered 2013-05-12: 20 mL/h via INTRAVENOUS

## 2013-05-12 MED ORDER — MORPHINE SULFATE 10 MG/ML IJ SOLN
INTRAMUSCULAR | Status: AC | PRN
  Administered 2013-05-12: 3 mg via INTRAVENOUS

## 2013-05-12 MED ORDER — MORPHINE SULFATE 10 MG/ML IJ SOLN
10.0000 mg | Freq: Once | INTRAMUSCULAR | Status: DC
  Filled 2013-05-12: qty 1

## 2013-05-12 NOTE — ED Notes (Addendum)
Patient denies pain and is resting comfortably.  

## 2013-05-12 NOTE — Progress Notes (Signed)
Brief examination was performed. ENT: adequate airway clearance Heart: regular rate and rhythm.No Murmurs Lungs: clear to auscultation, no wheezes, normal respiratory effort  Bone Marrow Biopsy and Aspiration Procedure Note   Informed consent was obtained and potential risks including bleeding, infection and pain were reviewed with the patient. I verified that the patient has been fasting since midnight.  The patient's name, date of birth, identification, consent and allergies were verified prior to the start of procedure and time out was performed.  A total of 6 mg of Versed was given. 3 mg of IV morphine was given  The left posterior iliac crest was chosen as the site of biopsy.  The skin was prepped with Betadine solution.   3 cc of 2% lidocaine was used to provide local anaesthesia.   10 cc of bone marrow aspirate was obtained followed by 1 inch biopsy.   The procedure was tolerated well and there were no complications.  The patient was stable at the end of the procedure.  Specimens sent for flow cytometry, cytogenetics and additional studies.

## 2013-05-15 ENCOUNTER — Other Ambulatory Visit: Payer: Self-pay | Admitting: Hematology and Oncology

## 2013-05-19 ENCOUNTER — Encounter (HOSPITAL_COMMUNITY): Payer: Self-pay

## 2013-05-23 ENCOUNTER — Other Ambulatory Visit: Payer: Self-pay | Admitting: Hematology and Oncology

## 2013-05-23 ENCOUNTER — Telehealth: Payer: Self-pay | Admitting: Hematology and Oncology

## 2013-05-23 ENCOUNTER — Ambulatory Visit (HOSPITAL_BASED_OUTPATIENT_CLINIC_OR_DEPARTMENT_OTHER): Payer: Medicare Other | Admitting: Hematology and Oncology

## 2013-05-23 ENCOUNTER — Other Ambulatory Visit (HOSPITAL_BASED_OUTPATIENT_CLINIC_OR_DEPARTMENT_OTHER): Payer: Medicare Other | Admitting: Lab

## 2013-05-23 VITALS — BP 159/65 | HR 64 | Temp 97.0°F | Resp 20 | Ht 64.5 in | Wt 130.8 lb

## 2013-05-23 DIAGNOSIS — C9000 Multiple myeloma not having achieved remission: Secondary | ICD-10-CM

## 2013-05-23 DIAGNOSIS — Z853 Personal history of malignant neoplasm of breast: Secondary | ICD-10-CM

## 2013-05-23 DIAGNOSIS — D649 Anemia, unspecified: Secondary | ICD-10-CM

## 2013-05-23 LAB — COMPREHENSIVE METABOLIC PANEL (CC13)
Albumin: 3.4 g/dL — ABNORMAL LOW (ref 3.5–5.0)
Alkaline Phosphatase: 70 U/L (ref 40–150)
Anion Gap: 9 mEq/L (ref 3–11)
BUN: 18 mg/dL (ref 7.0–26.0)
CO2: 26 mEq/L (ref 22–29)
Creatinine: 0.8 mg/dL (ref 0.6–1.1)
Glucose: 115 mg/dl (ref 70–140)
Potassium: 3.9 mEq/L (ref 3.5–5.1)
Total Bilirubin: 0.37 mg/dL (ref 0.20–1.20)

## 2013-05-23 LAB — CBC WITH DIFFERENTIAL/PLATELET
BASO%: 0.6 % (ref 0.0–2.0)
Basophils Absolute: 0 10*3/uL (ref 0.0–0.1)
EOS%: 1.2 % (ref 0.0–7.0)
Eosinophils Absolute: 0 10*3/uL (ref 0.0–0.5)
HCT: 34 % — ABNORMAL LOW (ref 34.8–46.6)
HGB: 11.2 g/dL — ABNORMAL LOW (ref 11.6–15.9)
LYMPH%: 19.9 % (ref 14.0–49.7)
MCH: 32.5 pg (ref 25.1–34.0)
MCHC: 32.9 g/dL (ref 31.5–36.0)
MCV: 99 fL (ref 79.5–101.0)
MONO#: 0.3 10*3/uL (ref 0.1–0.9)
MONO%: 7.1 % (ref 0.0–14.0)
NEUT#: 2.8 10*3/uL (ref 1.5–6.5)
NEUT%: 71.2 % (ref 38.4–76.8)
Platelets: 228 10*3/uL (ref 145–400)
RBC: 3.44 10*6/uL — ABNORMAL LOW (ref 3.70–5.45)
RDW: 16 % — ABNORMAL HIGH (ref 11.2–14.5)
WBC: 4 10*3/uL (ref 3.9–10.3)
lymph#: 0.8 10*3/uL — ABNORMAL LOW (ref 0.9–3.3)

## 2013-05-23 NOTE — Telephone Encounter (Signed)
gv pt appt schedule for December 2014 and January 2015.

## 2013-05-23 NOTE — Progress Notes (Signed)
[facility] OFFICE PROGRESS NOTE  Patient Care Team: Wendall Stade, MD as PCP - General (Cardiology) Thayer Headings, MD (Internal Medicine) Gaylord Shih, MD as Attending Physician (Cardiology) Artis Delay, MD as Consulting Physician (Hematology and Oncology)  DIAGNOSIS: History of right breast DCIS status post lumpectomy, radiation therapy and endocrine therapy, and multiple myeloma  SUMMARY OF ONCOLOGIC HISTORY:   MULTIPLE  MYELOMA   11/12/2008 Imaging Skeletal survey showed lytic lesion in the right femur compatible with  myeloma. There were Questionable skull lesions   11/15/2008 Bone Marrow Biopsy BONE MARROW ASPIRATE AND BIOPSY: showed NORMOCELLULAR MARROW FOR AGE WITH PLASMA CELL DYSCRASIA  (PLASMA CELLS 25%). Cytogenetics showed 13q- and FISh was positive for CCND1   12/04/2008 -  Chemotherapy Patient was placed initially on Revlimid/Melphalan/Dexamethasone but developed severe pancytopenia. Subsequently she was placed on Revlimid & Dexamethasone alone    INTERVAL HISTORY: Natasha Jackson 79 y.o. female returns for further followup. She has improvement in energy level after discontinuation of Revlimid. Denies any bone pain. She denies any recent fever, chills, night sweats or abnormal weight loss  I have reviewed the past medical history, past surgical history, social history and family history with the patient and they are unchanged from previous note.  ALLERGIES:  has No Known Allergies.  MEDICATIONS:  Current Outpatient Prescriptions  Medication Sig Dispense Refill  . aspirin 325 MG EC tablet Take 325 mg by mouth daily.      . cholecalciferol (VITAMIN D) 1000 UNITS tablet Take 1,000 Units by mouth daily.      [name] gabapentin (NEURONTIN) 100 MG capsule Take 200 mg by mouth at bedtime. 2-3 tabs po qhs      . levothyroxine (SYNTHROID, LEVOTHROID) 50 MCG tablet Take 50 mcg by mouth daily.      [name] loperamide (IMODIUM A-D) 2 MG tablet Take 2 mg by mouth as needed for  diarrhea or loose stools (as directed.).      [name] losartan (COZAAR) 25 MG tablet Take 25 mg by mouth daily.      . meloxicam (MOBIC) 7.5 MG tablet Take 7.5 mg by mouth 2 (two) times daily as needed for pain.        No current facility-administered medications for this visit.    REVIEW OF SYSTEMS:   Constitutional: Denies fevers, chills or abnormal weight loss All other systems were reviewed with the patient and are negative.  PHYSICAL EXAMINATION: ECOG PERFORMANCE STATUS: 0 - Asymptomatic  Filed Vitals:   05/23/13 1430  BP: 159/65  Pulse: 64  Temp: 97 F (36.1 C)  Resp: 20   Filed Weights   05/23/13 1430  Weight: 130 lb 12.8 oz (59.33 kg)    GENERAL:alert, no distress and comfortable NEURO: alert & oriented x 3 with fluent speech, no focal motor/sensory deficits  LABORATORY DATA:  I have reviewed the data as listed    Component Value Date/Time   NA 136 04/19/2013 1431   NA 137 01/29/2012 1513   K 3.7 04/19/2013 1431   K 4.5 01/29/2012 1513   CL 103 11/25/2012 1328   CL 102 01/29/2012 1513   CO2 24 04/19/2013 1431   CO2 29 01/29/2012 1513   GLUCOSE 87 04/19/2013 1431   GLUCOSE 91 11/25/2012 1328   GLUCOSE 81 01/29/2012 1513   BUN 21.0 04/19/2013 1431   BUN 20 01/29/2012 1513   CREATININE 0.8 04/19/2013 1431   CREATININE 0.76 01/29/2012 1513   CALCIUM 8.8 04/19/2013 1431   CALCIUM 9.0  01/29/2012 1513   PROT 5.8* 04/19/2013 1431   PROT 6.1 01/29/2012 1513   ALBUMIN 3.1* 04/19/2013 1431   ALBUMIN 3.5 01/29/2012 1513   AST 18 04/19/2013 1431   AST 25 01/29/2012 1513   ALT 15 04/19/2013 1431   ALT 16 01/29/2012 1513   ALKPHOS 55 04/19/2013 1431   ALKPHOS 68 01/29/2012 1513   BILITOT 0.47 04/19/2013 1431   BILITOT 0.3 01/29/2012 1513    No results found for this basename: SPEP, UPEP,  kappa and lambda light chains    Lab Results  Component Value Date   WBC 4.0 05/23/2013   NEUTROABS 2.8 05/23/2013   HGB 11.2* 05/23/2013   HCT 34.0* 05/23/2013   MCV 99.0 05/23/2013    PLT 228 05/23/2013      Chemistry      Component Value Date/Time   NA 136 04/19/2013 1431   NA 137 01/29/2012 1513   K 3.7 04/19/2013 1431   K 4.5 01/29/2012 1513   CL 103 11/25/2012 1328   CL 102 01/29/2012 1513   CO2 24 04/19/2013 1431   CO2 29 01/29/2012 1513   BUN 21.0 04/19/2013 1431   BUN 20 01/29/2012 1513   CREATININE 0.8 04/19/2013 1431   CREATININE 0.76 01/29/2012 1513      Component Value Date/Time   CALCIUM 8.8 04/19/2013 1431   CALCIUM 9.0 01/29/2012 1513   ALKPHOS 55 04/19/2013 1431   ALKPHOS 68 01/29/2012 1513   AST 18 04/19/2013 1431   AST 25 01/29/2012 1513   ALT 15 04/19/2013 1431   ALT 16 01/29/2012 1513   BILITOT 0.47 04/19/2013 1431   BILITOT 0.3 01/29/2012 1513     RADIOGRAPHIC STUDIES: I have personally reviewed the radiological images as listed and agreed with the findings in the report. Her most recent skeletal survey shows no new bone lesions of fracture.   ASSESSMENT & PLAN:  #1 multiple myeloma The patient has achieved very good partial response. We review her case at the tumor board today. Bone marrow aspirate and biopsy show persistent disease. We discussed about possibility of giving her a chemotherapy holiday versus continue Revlimid indefinitely. After a lot of discussion the patient is in agreement to just hold off and have a chemotherapy holiday until January. I will repeat blood test in January to assess her disease status. #2 preventive care Skeletal survey showed no evidence of bone fracture. I recommend continue calcium and vitamin D supplement. The patient received intravenous Zometa in the past. We could consider restarting Zometa in the near future. #3 history of breast cancer She will continue mammogram on yearly basis. #4 anemia This is likely due to recent treatment. The patient denies recent history of bleeding such as epistaxis, hematuria or hematochezia. She is asymptomatic from the anemia. I will observe for now. This has  improved since we hold Revlimid.  Orders Placed This Encounter  Procedures  . CBC with Differential    Standing Status: Future     Number of Occurrences:      Standing Expiration Date: 02/12/2014  . Comprehensive metabolic panel    Standing Status: Future     Number of Occurrences:      Standing Expiration Date: 05/23/2014  . Beta 2 microglobuline, serum    Standing Status: Future     Number of Occurrences:      Standing Expiration Date: 05/23/2014  . SPEP & IFE with QIG    Standing Status: Future     Number of Occurrences:  Standing Expiration Date: 05/23/2014  . Kappa/lambda light chains    Standing Status: Future     Number of Occurrences:      Standing Expiration Date: 05/23/2014   All questions were answered. The patient knows to call the clinic with any problems, questions or concerns. No barriers to learning was detected.   [facility], Rohail Klees, MD 05/23/2013 2:43 PM

## 2013-05-24 ENCOUNTER — Telehealth: Payer: Self-pay | Admitting: Cardiovascular Disease

## 2013-05-24 NOTE — Telephone Encounter (Signed)
Needs to be on something for BP and leaky aortic valve Ok to stop cozaar but start diovan 160 mg daily

## 2013-05-24 NOTE — Telephone Encounter (Signed)
New message     On medication called losartan ----it is causing her to loose her hair. Can she switch to hydroalazine?

## 2013-05-24 NOTE — Telephone Encounter (Signed)
PT THINKS  LOSARTAN  IS  CAUSING HAIR LOSS   HAS BEEN  ON MED QUIT  SOME TIME  IS  WILLING  TO TAKE  HCTZ  FOR  B/P  IF  THAT MED  IS APPROPRIATE  HAS  HAD  THYROID  CHECKED   AND  VALUE IS OKAY PER PT  WILL FORWARD TO DR Eden Emms FOR  REVIEW .Natasha Jackson

## 2013-05-29 ENCOUNTER — Encounter (HOSPITAL_COMMUNITY): Payer: Self-pay

## 2013-05-30 NOTE — Telephone Encounter (Signed)
Follow Up  Pt returning call about a change in her BP medication//

## 2013-05-30 NOTE — Telephone Encounter (Signed)
SPOKE  WITH  PT  WOULD LIKE TO  REMAIN ON LOSARTAN UNTIL  SHE SPEAKS  TO PHARMACISTS  PT  THOUGHT  THAT  SHE MAY HAVE BEEN ON DIOVAN IN PAST   PT TO  CALL BACK IF  DECIDES TO CHANGE MED.

## 2013-05-30 NOTE — Telephone Encounter (Signed)
LMTCB ./CY 

## 2013-05-30 NOTE — Telephone Encounter (Signed)
BUSY  SIG   X1 ./CY 

## 2013-06-27 ENCOUNTER — Telehealth: Payer: Self-pay | Admitting: Cardiovascular Disease

## 2013-06-27 MED ORDER — VALSARTAN 160 MG PO TABS: 160.0000 mg | ORAL_TABLET | Freq: Every day | ORAL | Status: DC

## 2013-06-27 NOTE — Telephone Encounter (Signed)
RX sent into pharmacy as requested.   

## 2013-06-27 NOTE — Telephone Encounter (Signed)
New message     Pt is ready to start diovan, pls call it in to [facility] at [address].

## 2013-06-30 ENCOUNTER — Other Ambulatory Visit (HOSPITAL_BASED_OUTPATIENT_CLINIC_OR_DEPARTMENT_OTHER): Payer: Medicare Other

## 2013-06-30 DIAGNOSIS — C9000 Multiple myeloma not having achieved remission: Secondary | ICD-10-CM

## 2013-06-30 LAB — CBC WITH DIFFERENTIAL/PLATELET
Basophils Absolute: 0 10*3/uL (ref 0.0–0.1)
Eosinophils Absolute: 0 10*3/uL (ref 0.0–0.5)
HCT: 34.7 % — ABNORMAL LOW (ref 34.8–46.6)
HGB: 11.1 g/dL — ABNORMAL LOW (ref 11.6–15.9)
LYMPH%: 18.8 % (ref 14.0–49.7)
MCHC: 31.9 g/dL (ref 31.5–36.0)
MONO#: 0.3 10*3/uL (ref 0.1–0.9)
NEUT#: 2.7 10*3/uL (ref 1.5–6.5)
NEUT%: 71.8 % (ref 38.4–76.8)
Platelets: 183 10*3/uL (ref 145–400)
RBC: 3.49 10*6/uL — ABNORMAL LOW (ref 3.70–5.45)
WBC: 3.7 10*3/uL — ABNORMAL LOW (ref 3.9–10.3)
lymph#: 0.7 10*3/uL — ABNORMAL LOW (ref 0.9–3.3)

## 2013-06-30 LAB — COMPREHENSIVE METABOLIC PANEL (CC13)
ALT: 20 U/L (ref 0–55)
Alkaline Phosphatase: 71 U/L (ref 40–150)
Anion Gap: 9 mEq/L (ref 3–11)
BUN: 18.9 mg/dL (ref 7.0–26.0)
CO2: 26 mEq/L (ref 22–29)
Calcium: 9.2 mg/dL (ref 8.4–10.4)
Chloride: 103 mEq/L (ref 98–109)
Creatinine: 0.9 mg/dL (ref 0.6–1.1)
Glucose: 99 mg/dl (ref 70–140)
Sodium: 138 mEq/L (ref 136–145)
Total Bilirubin: 0.42 mg/dL (ref 0.20–1.20)

## 2013-07-03 ENCOUNTER — Other Ambulatory Visit: Payer: Self-pay | Admitting: Hematology and Oncology

## 2013-07-03 ENCOUNTER — Telehealth: Payer: Self-pay | Admitting: *Deleted

## 2013-07-03 DIAGNOSIS — C9000 Multiple myeloma not having achieved remission: Secondary | ICD-10-CM

## 2013-07-03 NOTE — Telephone Encounter (Signed)
Informed pt of order placed for 24 hr urine and instructed pt to collect and bring in on Friday.  Discussed she does not need to bring in earlier (soonest she could bring it is Wednesday) since the results would not be back by Friday any ways.  She verbalized understanding.

## 2013-07-03 NOTE — Telephone Encounter (Signed)
Can you check w the lab? I believe it was collected I put in another oder

## 2013-07-03 NOTE — Telephone Encounter (Signed)
Pt states was here for labs last week and given a Urine jug for 24 hr urine collection.  She was instructed by lab to call us today to ask if Dr. Bertis Ruddy wants to order a 24 hr urine collection prior to her office visit at the end of this week?

## 2013-07-04 LAB — SPEP & IFE WITH QIG
Albumin ELP: 64.6 % (ref 55.8–66.1)
Alpha-1-Globulin: 3.7 % (ref 2.9–4.9)
Alpha-2-Globulin: 10.4 % (ref 7.1–11.8)
Beta 2: 3.6 % (ref 3.2–6.5)
Beta Globulin: 5.8 % (ref 4.7–7.2)
Gamma Globulin: 11.9 % (ref 11.1–18.8)
IgA: 60 mg/dL — ABNORMAL LOW (ref 69–380)
IgG (Immunoglobin G), Serum: 756 mg/dL (ref 690–1700)
IgM, Serum: 69 mg/dL (ref 52–322)
M-Spike, %: 0.38 g/dL

## 2013-07-04 LAB — KAPPA/LAMBDA LIGHT CHAINS: Kappa:Lambda Ratio: 1.42 (ref 0.26–1.65)

## 2013-07-04 LAB — BETA 2 MICROGLOBULIN, SERUM: Beta-2 Microglobulin: 1.61 mg/L (ref 1.01–1.73)

## 2013-07-07 ENCOUNTER — Telehealth: Payer: Self-pay | Admitting: Hematology and Oncology

## 2013-07-07 ENCOUNTER — Encounter: Payer: Self-pay | Admitting: Hematology and Oncology

## 2013-07-07 ENCOUNTER — Ambulatory Visit (HOSPITAL_BASED_OUTPATIENT_CLINIC_OR_DEPARTMENT_OTHER): Payer: Medicare Other | Admitting: Hematology and Oncology

## 2013-07-07 VITALS — BP 136/49 | HR 66 | Temp 97.5°F | Resp 20 | Ht 64.0 in | Wt 130.0 lb

## 2013-07-07 DIAGNOSIS — C9000 Multiple myeloma not having achieved remission: Secondary | ICD-10-CM

## 2013-07-07 DIAGNOSIS — D649 Anemia, unspecified: Secondary | ICD-10-CM

## 2013-07-07 DIAGNOSIS — D72819 Decreased white blood cell count, unspecified: Secondary | ICD-10-CM

## 2013-07-07 DIAGNOSIS — Z853 Personal history of malignant neoplasm of breast: Secondary | ICD-10-CM

## 2013-07-07 NOTE — Progress Notes (Signed)
[facility] OFFICE PROGRESS NOTE  Patient Care Team: Josue Hector, MD as PCP - General (Cardiology) Thressa Sheller, MD (Internal Medicine) Renella Cunas, MD as Attending Physician (Cardiology) Heath Lark, MD as Consulting Physician (Hematology and Oncology)  DIAGNOSIS: IgG kappa multiple myeloma, in very good partial response and history of right breast DCIS status post lumpectomy radiation therapy and endocrine therapy   SUMMARY OF ONCOLOGIC HISTORY:   MULTIPLE  MYELOMA   11/12/2008 Imaging Skeletal survey showed lytic lesion in the right femur compatible with  myeloma. There were Questionable skull lesions   11/15/2008 Bone Marrow Biopsy BONE MARROW ASPIRATE AND BIOPSY: showed NORMOCELLULAR MARROW FOR AGE WITH PLASMA CELL DYSCRASIA  (PLASMA CELLS 25%). 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 Subsequently she was placed on Revlimid & Dexamethasone alone   05/12/2013 Bone Marrow Biopsy Bone marrow biopsy showed persistent plasma cells. Blood work confirmed VGPR status    INTERVAL HISTORY: Natasha Jackson 80 y.o. female returns for further followup. She is feeling well. Denies any palpable lumps or bumps. Denies any bone pain. Since we discontinued her chemotherapy in October 2014, she has better energy level. Denies any recent infection I have reviewed the past medical history, past surgical history, social history and family history with the patient and they are unchanged from previous note.  ALLERGIES:  has No Known Allergies.  MEDICATIONS:  Current Outpatient Prescriptions  Medication Sig Dispense Refill  . aspirin 325 MG EC tablet Take 325 mg by mouth daily.      . cholecalciferol (VITAMIN D) 1000 UNITS tablet Take 1,000 Units by mouth daily.      [name] gabapentin (NEURONTIN) 100 MG capsule Take 200 mg by mouth at  bedtime. 2-3 tabs po qhs      . levothyroxine (SYNTHROID, LEVOTHROID) 50 MCG tablet Take 50 mcg by mouth daily.      [name] loperamide (IMODIUM A-D) 2 MG tablet Take 2 mg by mouth as needed for diarrhea or loose stools (as directed.).      [name] meloxicam (MOBIC) 7.5 MG tablet Take 7.5 mg by mouth 2 (two) times daily as needed for pain.       . valsartan (DIOVAN) 160 MG tablet Take 1 tablet (160 mg total) by mouth daily.  30 tablet  6   No current facility-administered medications for this visit.    REVIEW OF SYSTEMS:   Constitutional: Denies fevers, chills or abnormal weight loss Eyes: Denies blurriness of vision Ears, nose, mouth, throat, and face: Denies mucositis or sore throat Respiratory: Denies cough, dyspnea or wheezes Cardiovascular: Denies palpitation, chest discomfort or lower extremity swelling Gastrointestinal:  Denies nausea, heartburn or change in bowel habits Skin: Denies abnormal skin rashes Lymphatics: Denies new lymphadenopathy or easy bruising Neurological:Denies numbness, tingling or new weaknesses Behavioral/Psych: Mood is stable, no new changes  All other systems were reviewed with the patient and are negative.  PHYSICAL EXAMINATION: ECOG PERFORMANCE STATUS: 0 - Asymptomatic  Filed Vitals:   07/07/13 1438  BP: 136/49  Pulse: 66  Temp: 97.5 F (36.4 C)  Resp: 20   Filed Weights   07/07/13 1438  Weight: 130 lb (58.968 kg)    GENERAL:alert, no distress and comfortable SKIN: skin color, texture, turgor are normal, no rashes or significant lesions EYES: normal, Conjunctiva are pink and non-injected, sclera clear Musculoskeletal:no cyanosis of digits and no clubbing  NEURO: alert &  oriented x 3 with fluent speech, no focal motor/sensory deficits  LABORATORY DATA:  I have reviewed the data as listed    Component Value Date/Time   NA 138 06/30/2013 1410   NA 137 01/29/2012 1513   K 4.4 06/30/2013 1410   K 4.5 01/29/2012 1513   CL 103 11/25/2012 1328   CL 102  01/29/2012 1513   CO2 26 06/30/2013 1410   CO2 29 01/29/2012 1513   GLUCOSE 99 06/30/2013 1410   GLUCOSE 91 11/25/2012 1328   GLUCOSE 81 01/29/2012 1513   BUN 18.9 06/30/2013 1410   BUN 20 01/29/2012 1513   CREATININE 0.9 06/30/2013 1410   CREATININE 0.76 01/29/2012 1513   CALCIUM 9.2 06/30/2013 1410   CALCIUM 9.0 01/29/2012 1513   PROT 6.4 06/30/2013 1410   PROT 6.1 01/29/2012 1513   ALBUMIN 3.6 06/30/2013 1410   ALBUMIN 3.5 01/29/2012 1513   AST 25 06/30/2013 1410   AST 25 01/29/2012 1513   ALT 20 06/30/2013 1410   ALT 16 01/29/2012 1513   ALKPHOS 71 06/30/2013 1410   ALKPHOS 68 01/29/2012 1513   BILITOT 0.42 06/30/2013 1410   BILITOT 0.3 01/29/2012 1513    No results found for this basename: SPEP,  UPEP,   kappa and lambda light chains    Lab Results  Component Value Date   WBC 3.7* 06/30/2013   NEUTROABS 2.7 06/30/2013   HGB 11.1* 06/30/2013   HCT 34.7* 06/30/2013   MCV 99.3 06/30/2013   PLT 183 06/30/2013      Chemistry      Component Value Date/Time   NA 138 06/30/2013 1410   NA 137 01/29/2012 1513   K 4.4 06/30/2013 1410   K 4.5 01/29/2012 1513   CL 103 11/25/2012 1328   CL 102 01/29/2012 1513   CO2 26 06/30/2013 1410   CO2 29 01/29/2012 1513   BUN 18.9 06/30/2013 1410   BUN 20 01/29/2012 1513   CREATININE 0.9 06/30/2013 1410   CREATININE 0.76 01/29/2012 1513      Component Value Date/Time   CALCIUM 9.2 06/30/2013 1410   CALCIUM 9.0 01/29/2012 1513   ALKPHOS 71 06/30/2013 1410   ALKPHOS 68 01/29/2012 1513   AST 25 06/30/2013 1410   AST 25 01/29/2012 1513   ALT 20 06/30/2013 1410   ALT 16 01/29/2012 1513   BILITOT 0.42 06/30/2013 1410   BILITOT 0.3 01/29/2012 1513      ASSESSMENT & PLAN:  #1 multiple myeloma The patient has achieved very good partial response. Bone marrow aspirate and biopsy show persistent disease but there is no evidence of an organ damage . We discussed about possibility of giving her a chemotherapy holiday versus continue Revlimid  indefinitely. After a lot of discussion the patient is in agreement to just hold off and have a chemotherapy holiday until  April. I will repeat blood test in  April  to assess her disease status. #2 preventive care Skeletal survey showed no evidence of bone fracture. I recommend continue calcium and vitamin D supplement. The patient received intravenous Zometa in the past. We could consider restarting Zometa in the near future. #3 history of breast cancer She will continue mammogram on yearly basis. #4 anemia This is likely due to recent treatment. The patient denies recent history of bleeding such as epistaxis, hematuria or hematochezia. She is asymptomatic from the anemia. I will observe for now. This has improved since we hold Revlimid. #5 leukopenia This is stable  Orders Placed This  Encounter  Procedures  . Comprehensive metabolic panel    Standing Status: Future     Number of Occurrences:      Standing Expiration Date: 07/07/2014  . CBC with Differential    Standing Status: Future     Number of Occurrences:      Standing Expiration Date: 03/29/2014  . Lactate dehydrogenase    Standing Status: Future     Number of Occurrences:      Standing Expiration Date: 07/07/2014  . Beta 2 microglobuline, serum    Standing Status: Future     Number of Occurrences:      Standing Expiration Date: 07/07/2014  . SPEP & IFE with QIG    Standing Status: Future     Number of Occurrences:      Standing Expiration Date: 07/07/2014  . Kappa/lambda light chains    Standing Status: Future     Number of Occurrences:      Standing Expiration Date: 07/07/2014   All questions were answered. The patient knows to call the clinic with any problems, questions or concerns. No barriers to learning was detected. I spent 15 minutes counseling the patient face to face. The total time spent in the appointment was 20 minutes and more than 50% was on counseling and review of test results     [facility], [place],  MD 07/07/2013 2:59 PM

## 2013-07-07 NOTE — Telephone Encounter (Signed)
gv and printed appt sched and avs for pt for April 2015  °

## 2013-07-11 LAB — UIFE/LIGHT CHAINS/TP QN, 24-HR UR
ALPHA 1 UR: DETECTED — AB
Albumin, U: DETECTED
Alpha 2, Urine: DETECTED — AB
Beta, Urine: DETECTED — AB
FREE KAPPA LT CHAINS, UR: 1.76 mg/dL (ref 0.14–2.42)
FREE LAMBDA EXCRETION/DAY: 2.64 mg/d
Free Kappa/Lambda Ratio: 14.67 ratio — ABNORMAL HIGH (ref 2.04–10.37)
Free Lambda Lt Chains,Ur: 0.12 mg/dL (ref 0.02–0.67)
Free Lt Chn Excr Rate: 38.72 mg/d
GAMMA UR: DETECTED — AB
TIME-UPE24: 24 h
Total Protein, Urine-Ur/day: 53 mg/d (ref 10–140)
Total Protein, Urine: 2.4 mg/dL
Volume, Urine: 2200 mL

## 2013-07-12 ENCOUNTER — Other Ambulatory Visit: Payer: Self-pay | Admitting: Hematology and Oncology

## 2013-07-12 ENCOUNTER — Other Ambulatory Visit: Payer: Self-pay

## 2013-07-12 DIAGNOSIS — Z1231 Encounter for screening mammogram for malignant neoplasm of breast: Secondary | ICD-10-CM

## 2013-07-12 DIAGNOSIS — Z853 Personal history of malignant neoplasm of breast: Secondary | ICD-10-CM

## 2013-07-13 ENCOUNTER — Encounter: Payer: Self-pay | Admitting: Neurology

## 2013-07-13 ENCOUNTER — Ambulatory Visit (INDEPENDENT_AMBULATORY_CARE_PROVIDER_SITE_OTHER): Payer: Medicare Other | Admitting: Neurology

## 2013-07-13 VITALS — BP 156/65 | HR 65 | Temp 97.8°F | Ht 64.0 in | Wt 131.0 lb

## 2013-07-13 DIAGNOSIS — I7300 Raynaud's syndrome without gangrene: Secondary | ICD-10-CM

## 2013-07-13 DIAGNOSIS — G622 Polyneuropathy due to other toxic agents: Secondary | ICD-10-CM

## 2013-07-13 DIAGNOSIS — E559 Vitamin D deficiency, unspecified: Secondary | ICD-10-CM

## 2013-07-13 DIAGNOSIS — C9000 Multiple myeloma not having achieved remission: Secondary | ICD-10-CM

## 2013-07-13 NOTE — Patient Instructions (Signed)
I think overall you are doing fairly well but I do want to suggest a few things today:  Remember to drink plenty of fluid, eat healthy meals and do not skip any meals. Try to eat protein with a every meal and eat a healthy snack such as fruit or nuts in between meals. Try to keep a regular sleep-wake schedule and try to exercise daily, particularly in the form of walking, 20-30 minutes a day, if you can.   Engage in social activities in your community and with your family and try to keep up with current events by reading the newspaper or watching the news.   As far as your medications are concerned, I would like to suggest a trial of Gralise 300 mg strength ONLY - that is, the white caplets only, not the colored ones (which are 600 mg). Take 1 at bedtime and you have enough for 9 days. Call us in a week to report back and I can call in a prescription for you at the time. The most common side effects reported are sedation or sleepiness. Rare side effects include balance problems, confusion.    As far as diagnostic testing: we will do blood work today. We will not do electrical nerve or muscle testing as yet.   I would like to see you back in 3 months, sooner if we need to. Please call us with any interim questions, concerns, problems, updates or refill requests.  Please also call us for any test results so we can go over those with you on the phone. Our nursing staff will answer any of your questions and relay your messages to me and also relay most of my messages to you.  Our phone number is [phone]. We also have an after hours call service for urgent matters and there is a physician on-call for urgent questions. For any emergencies you know to call 911 or go to the nearest emergency room.

## 2013-07-13 NOTE — Progress Notes (Signed)
Subjective:    Patient ID: Natasha Jackson is a 80 y.o. female.  HPI  Star Age, MD, PhD [facility] [address] [place], [place] [address]  Dear Dr. Noah Delaine,  I saw your patient, [name], upon your kind request in my neurologic clinic today for initial consultation of her peripheral neuropathy. The patient is unaccompanied today. As you know, [name] is a very pleasant 80 year old right-handed woman with an underlying medical history of multiple myeloma (diagnosed in May 2009, with chemo started in 7/09), hypertension, aortic insufficiency, hypothyroidism, arthritis, bradycardia and DCIS, who has an approximately 4 year history of peripheral neuropathy. She reports a burning sensation and numbness and paresthesias in her feet primarily. She has been treated with for her multiple myeloma with chemotherapy. She's currently off of chemotherapy. She is followed by hematology/oncology. She also has a cardiologist. She has been on gabapentin 200 mg to 300 mg each night which has been helpful somewhat, but with the 300 mg she feels off balance and somewhat sedated.  She has had burning sensation in her feet and her toes for about 5 years, slowly progressive. She feels cold in her feet and her toes often stay purple. For years, maybe for 10-15 years, she has had blanching in her fingers and pain, especially when she is cold or the temperature is low, like today.  She has been off of her chemotherapy, lenalidomide, since 10/14 and has previously been off of it for months in 2011, without change in her symptoms. She has no family Hx of PN and is not diabetic.   Her Past Medical History Is Significant For: Past Medical History  Diagnosis Date  . Breast cancer   . Multiple myeloma   . Aortic regurgitation   . HTN (hypertension)   . Hot flash not due to menopause 12/23/2011  . Neuropathy 12/23/2011  . DCIS (ductal carcinoma in situ) 08/04/2012    Her  Past Surgical History Is Significant For: Past Surgical History  Procedure Laterality Date  . Lymph node excision - left groin    . Incisional hernia repair      Her Family History Is Significant For: Family History  Problem Relation Age of Onset  . Heart failure Mother   . Stroke Father     Her Social History Is Significant For: History   Social History  . Marital Status: Married    Spouse Name: N/A    Number of Children: N/A  . Years of Education: N/A   Social History Main Topics  . Smoking status: Never Smoker   . Smokeless tobacco: None  . Alcohol Use: No  . Drug Use: None  . Sexual Activity: None   Other Topics Concern  . None   Social History Narrative  . None    Her Allergies Are:  No Known Allergies:   Her Current Medications Are:  Outpatient Encounter Prescriptions as of 07/13/2013  Medication Sig  . aspirin 325 MG EC tablet Take 325 mg by mouth daily.  . cholecalciferol (VITAMIN D) 1000 UNITS tablet Take 1,000 Units by mouth daily.  [name] gabapentin (NEURONTIN) 100 MG capsule Take 200 mg by mouth at bedtime. 2-3 tabs po qhs  . levothyroxine (SYNTHROID, LEVOTHROID) 50 MCG tablet Take 50 mcg by mouth daily.  [name] loperamide (IMODIUM A-D) 2 MG tablet Take 2 mg by mouth as needed for diarrhea or loose stools (as directed.).  [name] meloxicam (MOBIC) 7.5 MG tablet Take 7.5 mg  by mouth 2 (two) times daily as needed for pain.   . valsartan (DIOVAN) 160 MG tablet Take 1 tablet (160 mg total) by mouth daily.   Review of Systems:  Out of a complete 14 point review of systems, all are reviewed and negative with the exception of these symptoms as listed below:   Review of Systems  Constitutional: Negative.   HENT: Positive for tinnitus.   Eyes: Negative.   Respiratory: Negative.   Cardiovascular: Negative.   Gastrointestinal: Negative.   Endocrine: Negative.   Genitourinary: Negative.   Musculoskeletal: Positive for arthralgias.  Skin: Negative.    Allergic/Immunologic: Negative.   Neurological: Positive for numbness.  Hematological: Bruises/bleeds easily.  Psychiatric/Behavioral: Negative.     Objective:  Neurologic Exam  Physical Exam Physical Examination:   Filed Vitals:   07/13/13 1315  BP: 156/65  Pulse: 65  Temp: 97.8 F (36.6 C)    General Examination: The patient is a very pleasant 80 y.o. female in no acute distress. She appears well-developed and well-nourished and very well groomed.   HEENT: Normocephalic, atraumatic, pupils are equal, round and reactive to light and accommodation. Funduscopic exam is normal with sharp disc margins noted. Extraocular tracking is good without limitation to gaze excursion or nystagmus noted. Normal smooth pursuit is noted. Hearing is grossly intact. Face is symmetric with normal facial animation and normal facial sensation. Speech is clear with no dysarthria noted. There is no hypophonia. There is no lip, neck/head, jaw or voice tremor. Neck is supple with full range of passive and active motion. There are no carotid bruits on auscultation. Oropharynx exam reveals: mild mouth dryness, adequate dental hygiene and no significant airway crowding. Mallampati is class II. Tongue protrudes centrally and palate elevates symmetrically.   Chest: Clear to auscultation without wheezing, rhonchi or crackles noted.  Heart: S1+S2+0, regular and normal without murmurs, rubs or gallops noted.   Abdomen: Soft, non-tender and non-distended with normal bowel sounds appreciated on auscultation.  Extremities: There is no pitting edema in the distal lower extremities bilaterally. Pedal pulses are intact, but toes and fingers are colder to touch and discolored, purplish.  Skin: Warm and dry without trophic changes noted, with the exception of thickened toenails on the big toes bilaterally as well as purple discoloration of her toes and to a lesser degree her fingertips, with cold toes and fingers noted.  There are no varicose veins.  Musculoskeletal: exam reveals no obvious joint deformities, tenderness or joint swelling or erythema.   Neurologically:  Mental status: The patient is awake, alert and oriented in all 4 spheres. Her memory, attention, language and knowledge are appropriate. There is no aphasia, agnosia, apraxia or anomia. Speech is clear with normal prosody and enunciation. Thought process is linear. Mood is congruent and affect is normal.  Cranial nerves are as described above under HEENT exam. In addition, shoulder shrug is normal with equal shoulder height noted. Motor exam: Normal bulk, strength and tone is noted. There is no drift, tremor or rebound. Romberg is negative. Reflexes are 2+ throughout. Toes are downgoing bilaterally. Fine motor skills are intact with normal finger taps, normal hand movements, normal rapid alternating patting, normal foot taps and normal foot agility.  Cerebellar testing shows no dysmetria or intention tremor on finger to nose testing. Heel to shin is unremarkable bilaterally. There is no truncal or gait ataxia.  Sensory exam is significant for intact light touch sensation but decrease in vibration sense in the distal lower extremities as well as  decrease in temperature sense in her feet, especially her toes. She also has more pronounced pinprick sensation in the bottom of her feet. This feels uncomfortable to her. Proprioception is intact. Babinski's is negative.  Gait, station and balance: she stands up without difficulty and no veering to one side is noted. No leaning to one side is noted. Posture is age-appropriate and stance is narrow based. No problems turning are noted.   Assessment and Plan:    In summary, YALONDA SAMPLE is a very pleasant 80 y.o.-year old female with a history of multiple myeloma, on and off on chemotherapy for the past 5 years, DCIS, status post lumpectomy and radiation therapy, who presents with a 5 year history of slowly  progressive pain, burning sensation and abnormal sensation in her feet primarily. She also describes and appears to have Raynaud's phenomenon. This has been going on for many years. Her physical exam is in keeping with peripheral neuropathy. She has otherwise a fairly nonfocal exam. In multiple myeloma, peripheral neuropathy is typically considered secondary to the plasma cell dyscrasia itself, but with the advent of targeted drugs such as thalidomide and related drugs such as the medication she has been on, neurotoxicity has become an additional concern for etiology of peripheral neuropathy. I explained this to her. She also understands that nerve damage is typically not reversible. For symptomatic treatment she has been tried on gabapentin with some success but she has noticed side effects including balance problems and feeling sedated on the 300 mg. She currently continues to take 200 mg at night. I had a long chat with the patient about my findings and the diagnosis of PN, the prognosis and treatment options. We talked about medical treatments and non-pharmacological approaches. I wonder if there is a component of vascular disease. She has never seen a vascular specialist she states. At this juncture, I would like to check her ANA. For symptomatic treatment, I suggested a trial of Gralise, which is a once daily long acting gabapentin formulation. I provided her with samples of 300 mg for a total of 9 days. Her sample pack also includes a 600 mg strength but I would like to avoid that for fear of side effects. I talked to her about potential side effects. She is advised to use this medication in lieu of her generic gabapentin at this time to see if she feels improved. Alternatively we can also try her on Horizant 300 mg qHS down the Road. I have advised her to call us in about a week for an update as to how she's doing on this medication. If she feels improved in her symptoms we can call her in a prescription.  We will also call her with her blood work results. She is advised to make a followup appointment in about 3 months, sooner if the need arises. She is encouraged to call with any interim questions, concerns, problems or updates and refill requests.  Thank you very much for allowing me to participate in the care of this nice patient. If I can be of any further assistance to you please do not hesitate to call me at [phone].  Sincerely,   Star Age, MD, PhD

## 2013-07-17 LAB — HGB A1C W/O EAG: Hgb A1c MFr Bld: 5.7 % — ABNORMAL HIGH (ref 4.8–5.6)

## 2013-07-17 LAB — VITAMIN B1, WHOLE BLOOD: THIAMINE: 127.6 nmol/L (ref 66.5–200.0)

## 2013-07-17 LAB — ANA W/REFLEX: ANA: NEGATIVE

## 2013-07-19 NOTE — Progress Notes (Signed)
Quick Note:  Please call and advise the patient that the recent labs we checked were within normal limits. No further action is required on these tests at this time. Please remind patient to keep any upcoming appointments or tests and to call us with any interim questions, concerns, problems or updates. Thanks,  Star Age, MD, PhD    ______

## 2013-07-19 NOTE — Progress Notes (Signed)
Quick Note:  Shared normal results per Dr Guadelupe Sabin findings, she verbalized understanding ______

## 2013-08-24 ENCOUNTER — Other Ambulatory Visit: Payer: Self-pay | Admitting: Dermatology

## 2013-08-30 ENCOUNTER — Ambulatory Visit
Admission: RE | Admit: 2013-08-30 | Discharge: 2013-08-30 | Disposition: A | Discharge status: Home / Self Care | Payer: Medicare Other | Source: Ambulatory Visit | Attending: Hematology and Oncology | Admitting: Hematology and Oncology

## 2013-08-30 ENCOUNTER — Other Ambulatory Visit: Payer: Self-pay | Admitting: Hematology and Oncology

## 2013-08-30 ENCOUNTER — Other Ambulatory Visit: Payer: Self-pay

## 2013-08-30 DIAGNOSIS — Z853 Personal history of malignant neoplasm of breast: Secondary | ICD-10-CM

## 2013-08-30 DIAGNOSIS — N6320 Unspecified lump in the left breast, unspecified quadrant: Secondary | ICD-10-CM

## 2013-08-31 ENCOUNTER — Other Ambulatory Visit: Payer: Self-pay | Admitting: Hematology and Oncology

## 2013-08-31 ENCOUNTER — Telehealth: Payer: Self-pay | Admitting: *Deleted

## 2013-08-31 NOTE — Telephone Encounter (Signed)
Pt states having bad hot flashes.  She did not tolerate effexor in the past and Dr. Alvy Bimler had prescribed Celexa last year some time.  Pt states she only took the Celexa for a few days because it made her a little nauseated.  Now she wants to try it again and asks if Dr. Alvy Bimler will prescribe it again?   She threw out the bottle she had.

## 2013-08-31 NOTE — Telephone Encounter (Signed)
Informed pt Dr. Alvy Bimler would like to wait until pt has labs and sees her again in April.   Pt verbalized understanding.  She adds that her Hot flashes started after Dr. Lamonte Sakai took her off Premarin about 5 yrs ago.  Says she has them about 5 to 6 times per day and 3 to 4 times at nights.   She first suddenly feels very angry/frustrated/depressed,  Then her ankles hurt like a very tight rubber band around them,  Then the hot flash comes.  States all this happens in the span of about 10 minutes then goes away.

## 2013-09-08 ENCOUNTER — Telehealth: Payer: Self-pay | Admitting: Cardiovascular Disease

## 2013-09-08 NOTE — Telephone Encounter (Signed)
New problem   Pt has been having a pinching pain behind her left breast for 2wk. Pt stated pain comes periodically. Pt need to talk to nurse concerning this matter.

## 2013-09-08 NOTE — Telephone Encounter (Signed)
LMTCB ./CY 

## 2013-09-11 NOTE — Telephone Encounter (Signed)
LMTCB ./CY 

## 2013-09-12 ENCOUNTER — Telehealth: Payer: Self-pay | Admitting: Cardiovascular Disease

## 2013-09-12 NOTE — Telephone Encounter (Signed)
SPOKE  WITH  PT  RE   PREVIOUS  MESSAGE    PAIN  BEHIND  LEFT  BREAST   SHARP  PINCHING  SENSATION  OFF AND ON  FOR   2  WEEKS  THEN  BECOMING   MORE  FREQUENT  PER   PT  SINCE   LEAVING  MESSAGE   PAIN  HAS  WENT   AWAY .DOES  NOT  WISH TO PURSUE    AT  THIS TIME .REVIEWED  PT'S  CHART   AND  APPEARS PT IS  OVER DUE  FOR  APPT   . APPT  MADE  FOR   10-02-13  AT  11:15 AM

## 2013-09-12 NOTE — Telephone Encounter (Signed)
New message ° °Patient is returning your call. Please call back.  °

## 2013-10-02 ENCOUNTER — Encounter: Payer: Self-pay | Admitting: Cardiovascular Disease

## 2013-10-02 ENCOUNTER — Ambulatory Visit (INDEPENDENT_AMBULATORY_CARE_PROVIDER_SITE_OTHER): Payer: Medicare Other | Admitting: Cardiovascular Disease

## 2013-10-02 ENCOUNTER — Telehealth: Payer: Self-pay | Admitting: Hematology and Oncology

## 2013-10-02 VITALS — BP 138/70 | HR 56 | Ht 65.0 in | Wt 129.0 lb

## 2013-10-02 DIAGNOSIS — I351 Nonrheumatic aortic (valve) insufficiency: Secondary | ICD-10-CM

## 2013-10-02 DIAGNOSIS — Z780 Asymptomatic menopausal state: Secondary | ICD-10-CM

## 2013-10-02 DIAGNOSIS — I447 Left bundle-branch block, unspecified: Secondary | ICD-10-CM

## 2013-10-02 DIAGNOSIS — I359 Nonrheumatic aortic valve disorder, unspecified: Secondary | ICD-10-CM

## 2013-10-02 NOTE — Assessment & Plan Note (Signed)
Stable no high grade AV block No need for pacer  Interestingly her husband Reg sees [facility] for pacer

## 2013-10-02 NOTE — Assessment & Plan Note (Signed)
F/U primary meloxicam for severe pain

## 2013-10-02 NOTE — Patient Instructions (Signed)

## 2013-10-02 NOTE — Telephone Encounter (Signed)
cld & tlwd w/pt to adv that per Dr Alvy Bimler to chge appt for 4/10 @ 1:00pm.Pt was aware of chge and statd will be here.

## 2013-10-02 NOTE — Progress Notes (Signed)
Patient ID: Natasha KLOEPPEL, female   DOB: [birth year], 80 y.o.   MRN: [id number] HPI: pleasant female followed by Dr Stanford Breed  On  09/22/12 seen for evaluation of bradycardia; Has h/o aortic insufficiency and hypertension. Last echocardiogram in July of 2013 showed an ejection fraction of 45-50% and moderate aortic insufficiency. There was mild mitral regurgitation and mild left atrial enlargement. Patient denies dyspnea on exertion, orthopnea, PND, pedal edema, palpitations or syncope. Yesterday she turned her head and had transient dizziness for 10 seconds not associated with chest pain, palpitations and no syncope. Currently being Rx for myeloma with revlimid and decadron. Bothered most by neuropathy in feet   F/U event monitor showed SR with PAC;s and PVC;s mean HR 62 with min rate early in am 46 with sinus bradycaria no AV block  Primary issues are arthritis in back and hands and hot flashes    ROS: Denies fever, malais, weight loss, blurry vision, decreased visual acuity, cough, sputum, SOB, hemoptysis, pleuritic pain, palpitaitons, heartburn, abdominal pain, melena, lower extremity edema, claudication, or rash.  All other systems reviewed and negative  General: Affect appropriate Healthy:  appears stated age 55: normal Neck supple with no adenopathy JVP normal no bruits no thyromegaly Lungs clear with no wheezing and good diaphragmatic motion Heart:  S1/S2 AS/AR  murmur, no rub, gallop or click PMI normal Abdomen: benighn, BS positve, no tenderness, no AAA no bruit.  No HSM or HJR Distal pulses intact with no bruits No edema Neuro non-focal Skin warm and dry No muscular weakness   Current Outpatient Prescriptions  Medication Sig Dispense Refill  . aspirin 325 MG EC tablet Take 325 mg by mouth daily.      . cholecalciferol (VITAMIN D) 1000 UNITS tablet Take 1,000 Units by mouth daily.      [name] gabapentin (NEURONTIN) 100 MG capsule Take 200 mg by mouth at bedtime. 2-3 tabs po qhs       . levothyroxine (SYNTHROID, LEVOTHROID) 50 MCG tablet Take 50 mcg by mouth daily.      [name] loperamide (IMODIUM A-D) 2 MG tablet Take 2 mg by mouth as needed for diarrhea or loose stools (as directed.).      [name] meloxicam (MOBIC) 7.5 MG tablet Take 7.5 mg by mouth 2 (two) times daily as needed for pain.       . valsartan (DIOVAN) 160 MG tablet Take 1 tablet (160 mg total) by mouth daily.  30 tablet  6   No current facility-administered medications for this visit.    Allergies  Review of patient's allergies indicates no known allergies.  Electrocardiogram:  SR 57 LVVV Limb lead reversal new since 3/14  LBBB old   Assessment and Plan

## 2013-10-02 NOTE — Assessment & Plan Note (Signed)
Well controlled.  Continue current medications and low sodium Dash type diet.    

## 2013-10-02 NOTE — Assessment & Plan Note (Signed)
Two years since last echo moderate AR with EF 45%  F/u echo  Clinically stable and on ARB

## 2013-10-06 ENCOUNTER — Other Ambulatory Visit (HOSPITAL_BASED_OUTPATIENT_CLINIC_OR_DEPARTMENT_OTHER): Payer: Medicare Other

## 2013-10-06 DIAGNOSIS — D649 Anemia, unspecified: Secondary | ICD-10-CM

## 2013-10-06 DIAGNOSIS — D72819 Decreased white blood cell count, unspecified: Secondary | ICD-10-CM

## 2013-10-06 DIAGNOSIS — C9000 Multiple myeloma not having achieved remission: Secondary | ICD-10-CM

## 2013-10-06 LAB — COMPREHENSIVE METABOLIC PANEL (CC13)
ALT: 18 U/L (ref 0–55)
ANION GAP: 10 meq/L (ref 3–11)
AST: 26 U/L (ref 5–34)
Albumin: 3.6 g/dL (ref 3.5–5.0)
Alkaline Phosphatase: 70 U/L (ref 40–150)
BUN: 21.4 mg/dL (ref 7.0–26.0)
CALCIUM: 9.2 mg/dL (ref 8.4–10.4)
CO2: 22 meq/L (ref 22–29)
CREATININE: 0.8 mg/dL (ref 0.6–1.1)
Chloride: 105 mEq/L (ref 98–109)
Glucose: 132 mg/dl (ref 70–140)
Potassium: 4.1 mEq/L (ref 3.5–5.1)
Sodium: 138 mEq/L (ref 136–145)
Total Bilirubin: 0.35 mg/dL (ref 0.20–1.20)
Total Protein: 6.5 g/dL (ref 6.4–8.3)

## 2013-10-06 LAB — CBC WITH DIFFERENTIAL/PLATELET
BASO%: 0.5 % (ref 0.0–2.0)
BASOS ABS: 0 10*3/uL (ref 0.0–0.1)
EOS ABS: 0 10*3/uL (ref 0.0–0.5)
EOS%: 0.5 % (ref 0.0–7.0)
HCT: 33.5 % — ABNORMAL LOW (ref 34.8–46.6)
HEMOGLOBIN: 11.2 g/dL — AB (ref 11.6–15.9)
LYMPH%: 22.2 % (ref 14.0–49.7)
MCH: 33.3 pg (ref 25.1–34.0)
MCHC: 33.5 g/dL (ref 31.5–36.0)
MCV: 99.5 fL (ref 79.5–101.0)
MONO#: 0.3 10*3/uL (ref 0.1–0.9)
MONO%: 5.8 % (ref 0.0–14.0)
NEUT%: 71 % (ref 38.4–76.8)
NEUTROS ABS: 3.1 10*3/uL (ref 1.5–6.5)
Platelets: 202 10*3/uL (ref 145–400)
RBC: 3.37 10*6/uL — ABNORMAL LOW (ref 3.70–5.45)
RDW: 14.3 % (ref 11.2–14.5)
WBC: 4.4 10*3/uL (ref 3.9–10.3)
lymph#: 1 10*3/uL (ref 0.9–3.3)

## 2013-10-06 LAB — LACTATE DEHYDROGENASE (CC13): LDH: 191 U/L (ref 125–245)

## 2013-10-11 LAB — SPEP & IFE WITH QIG
Albumin ELP: 62.5 % (ref 55.8–66.1)
Alpha-1-Globulin: 3.3 % (ref 2.9–4.9)
Alpha-2-Globulin: 10.5 % (ref 7.1–11.8)
Beta 2: 2.7 % — ABNORMAL LOW (ref 3.2–6.5)
Beta Globulin: 5.7 % (ref 4.7–7.2)
GAMMA GLOBULIN: 15.3 % (ref 11.1–18.8)
IGA: 66 mg/dL — AB (ref 69–380)
IGM, SERUM: 69 mg/dL (ref 52–322)
IgG (Immunoglobin G), Serum: 997 mg/dL (ref 690–1700)
M-SPIKE, %: 0.62 g/dL
Total Protein, Serum Electrophoresis: 6.2 g/dL (ref 6.0–8.3)

## 2013-10-11 LAB — BETA 2 MICROGLOBULIN, SERUM: BETA 2 MICROGLOBULIN: 2.28 mg/L (ref <=2.51)

## 2013-10-11 LAB — KAPPA/LAMBDA LIGHT CHAINS
Kappa free light chain: 2.71 mg/dL — ABNORMAL HIGH (ref 0.33–1.94)
Kappa:Lambda Ratio: 2.28 — ABNORMAL HIGH (ref 0.26–1.65)
LAMBDA FREE LGHT CHN: 1.19 mg/dL (ref 0.57–2.63)

## 2013-10-13 ENCOUNTER — Ambulatory Visit (HOSPITAL_BASED_OUTPATIENT_CLINIC_OR_DEPARTMENT_OTHER): Payer: Medicare Other | Admitting: Hematology and Oncology

## 2013-10-13 ENCOUNTER — Ambulatory Visit: Payer: BC Managed Care – PPO | Admitting: Hematology and Oncology

## 2013-10-13 ENCOUNTER — Encounter: Payer: Self-pay | Admitting: Cardiology

## 2013-10-13 ENCOUNTER — Ambulatory Visit (HOSPITAL_COMMUNITY): Payer: Medicare Other | Attending: Cardiology | Admitting: Radiology

## 2013-10-13 VITALS — BP 143/54 | HR 77 | Temp 97.5°F | Resp 18 | Wt 131.1 lb

## 2013-10-13 DIAGNOSIS — Z853 Personal history of malignant neoplasm of breast: Secondary | ICD-10-CM

## 2013-10-13 DIAGNOSIS — I359 Nonrheumatic aortic valve disorder, unspecified: Secondary | ICD-10-CM | POA: Insufficient documentation

## 2013-10-13 DIAGNOSIS — C9000 Multiple myeloma not having achieved remission: Secondary | ICD-10-CM

## 2013-10-13 DIAGNOSIS — I351 Nonrheumatic aortic (valve) insufficiency: Secondary | ICD-10-CM

## 2013-10-13 DIAGNOSIS — G629 Polyneuropathy, unspecified: Secondary | ICD-10-CM

## 2013-10-13 DIAGNOSIS — D649 Anemia, unspecified: Secondary | ICD-10-CM

## 2013-10-13 DIAGNOSIS — G609 Hereditary and idiopathic neuropathy, unspecified: Secondary | ICD-10-CM

## 2013-10-13 DIAGNOSIS — M899 Disorder of bone, unspecified: Secondary | ICD-10-CM

## 2013-10-13 DIAGNOSIS — M949 Disorder of cartilage, unspecified: Secondary | ICD-10-CM

## 2013-10-13 MED ORDER — GABAPENTIN 300 MG PO CAPS: 300.0000 mg | ORAL_CAPSULE | Freq: Three times a day (TID) | ORAL | Status: DC

## 2013-10-13 NOTE — Progress Notes (Signed)
[place] OFFICE PROGRESS NOTE  Patient Care Team: Josue Hector, MD as PCP - General (Cardiology) Thressa Sheller, MD (Internal Medicine) Renella Cunas, MD as Attending Physician (Cardiology) Heath Lark, MD as Consulting Physician (Hematology and Oncology)  DIAGNOSIS: IgG kappa multiple myeloma and history of breast cancer status post lumpectomy, radiation therapy and endocrine therapy  SUMMARY OF ONCOLOGIC HISTORY:   MULTIPLE  MYELOMA   11/12/2008 Imaging Skeletal survey showed lytic lesion in the right femur compatible with  myeloma. There were Questionable skull lesions   11/15/2008 Bone Marrow Biopsy BONE MARROW ASPIRATE AND BIOPSY: showed NORMOCELLULAR MARROW FOR AGE WITH PLASMA CELL DYSCRASIA  (PLASMA CELLS 25%). 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 Subsequently she was placed on Revlimid & Dexamethasone alone   05/12/2013 Bone Marrow Biopsy Bone marrow biopsy showed persistent plasma cells. Blood work confirmed VGPR status    INTERVAL HISTORY: Natasha Jackson 80 y.o. female returns for further followup. She still feels well been off treatment. The only complaint is intermittent right hip pain and intermittent pain in the right angle of the jaw. She saw her dentist and was told it was not dental problem. She takes meloxicam and that controls her pain well. She has ongoing peripheral neuropathy in her feet and takes gabapentin intermittently. She denies any new palpable breast lumps. Denies any recent infection.  I have reviewed the past medical history, past surgical history, social history and family history with the patient and they are unchanged from previous note.  ALLERGIES:  has No Known Allergies.  MEDICATIONS:  Current Outpatient Prescriptions  Medication Sig Dispense Refill  . aspirin 325 MG EC  tablet Take 325 mg by mouth daily.      . cholecalciferol (VITAMIN D) 1000 UNITS tablet Take 1,000 Units by mouth daily.      [name] levothyroxine (SYNTHROID, LEVOTHROID) 50 MCG tablet Take 50 mcg by mouth daily.      [name] loperamide (IMODIUM A-D) 2 MG tablet Take 2 mg by mouth as needed for diarrhea or loose stools (as directed.).      [name] meloxicam (MOBIC) 7.5 MG tablet Take 7.5 mg by mouth 2 (two) times daily as needed for pain.       . valsartan (DIOVAN) 160 MG tablet Take 1 tablet (160 mg total) by mouth daily.  30 tablet  6  . gabapentin (NEURONTIN) 300 MG capsule Take 1 capsule (300 mg total) by mouth 3 (three) times daily.  90 capsule  3   No current facility-administered medications for this visit.    REVIEW OF SYSTEMS:   Constitutional: Denies fevers, chills or abnormal weight loss Eyes: Denies blurriness of vision Ears, nose, mouth, throat, and face: Denies mucositis or sore throat Respiratory: Denies cough, dyspnea or wheezes Cardiovascular: Denies palpitation, chest discomfort or lower extremity swelling Gastrointestinal:  Denies nausea, heartburn or change in bowel habits Skin: Denies abnormal skin rashes Lymphatics: Denies new lymphadenopathy or easy bruising Neurological:Denies numbness, tingling or new weaknesses Behavioral/Psych: Mood is stable, no new changes  All other systems were reviewed with the patient and are negative.  PHYSICAL EXAMINATION: ECOG PERFORMANCE STATUS: 1 - Symptomatic but completely ambulatory  There were no vitals filed for this visit. There were no vitals filed for this visit.  GENERAL:alert, no distress and comfortable. She looks thin but not cachectic SKIN: skin color, texture, turgor are normal, no rashes  or significant lesions EYES: normal, Conjunctiva are pink and non-injected, sclera clear OROPHARYNX:no exudate, no erythema and lips, buccal mucosa, and tongue normal  NECK: supple, thyroid normal size, non-tender, without nodularity LYMPH:  no  palpable lymphadenopathy in the cervical, axillary or inguinal LUNGS: clear to auscultation and percussion with normal breathing effort HEART: regular rate & rhythm and no murmurs and no lower extremity edema ABDOMEN:abdomen soft, non-tender and normal bowel sounds Musculoskeletal:no cyanosis of digits and no clubbing  NEURO: alert & oriented x 3 with fluent speech, no focal motor/sensory deficits  LABORATORY DATA:  I have reviewed the data as listed    Component Value Date/Time   NA 138 10/06/2013 1412   NA 137 01/29/2012 1513   K 4.1 10/06/2013 1412   K 4.5 01/29/2012 1513   CL 103 11/25/2012 1328   CL 102 01/29/2012 1513   CO2 22 10/06/2013 1412   CO2 29 01/29/2012 1513   GLUCOSE 132 10/06/2013 1412   GLUCOSE 91 11/25/2012 1328   GLUCOSE 81 01/29/2012 1513   BUN 21.4 10/06/2013 1412   BUN 20 01/29/2012 1513   CREATININE 0.8 10/06/2013 1412   CREATININE 0.76 01/29/2012 1513   CALCIUM 9.2 10/06/2013 1412   CALCIUM 9.0 01/29/2012 1513   PROT 6.5 10/06/2013 1412   PROT 6.1 01/29/2012 1513   ALBUMIN 3.6 10/06/2013 1412   ALBUMIN 3.5 01/29/2012 1513   AST 26 10/06/2013 1412   AST 25 01/29/2012 1513   ALT 18 10/06/2013 1412   ALT 16 01/29/2012 1513   ALKPHOS 70 10/06/2013 1412   ALKPHOS 68 01/29/2012 1513   BILITOT 0.35 10/06/2013 1412   BILITOT 0.3 01/29/2012 1513    No results found for this basename: SPEP,  UPEP,   kappa and lambda light chains    Lab Results  Component Value Date   WBC 4.4 10/06/2013   NEUTROABS 3.1 10/06/2013   HGB 11.2* 10/06/2013   HCT 33.5* 10/06/2013   MCV 99.5 10/06/2013   PLT 202 10/06/2013      Chemistry      Component Value Date/Time   NA 138 10/06/2013 1412   NA 137 01/29/2012 1513   K 4.1 10/06/2013 1412   K 4.5 01/29/2012 1513   CL 103 11/25/2012 1328   CL 102 01/29/2012 1513   CO2 22 10/06/2013 1412   CO2 29 01/29/2012 1513   BUN 21.4 10/06/2013 1412   BUN 20 01/29/2012 1513   CREATININE 0.8 10/06/2013 1412   CREATININE 0.76 01/29/2012 1513      Component Value Date/Time   CALCIUM 9.2  10/06/2013 1412   CALCIUM 9.0 01/29/2012 1513   ALKPHOS 70 10/06/2013 1412   ALKPHOS 68 01/29/2012 1513   AST 26 10/06/2013 1412   AST 25 01/29/2012 1513   ALT 18 10/06/2013 1412   ALT 16 01/29/2012 1513   BILITOT 0.35 10/06/2013 1412   BILITOT 0.3 01/29/2012 1513    ASSESSMENT & PLAN:  #1 multiple myeloma The patient has achieved very good partial response. Bone marrow aspirate and biopsy show persistent disease but there is no evidence of an organ damage . She was placed on treatment holiday and she feels wonderful. Her M spike is increasing likely secondary to progression of the disease. The patient would like a few more months of chemotherapy holiday which I think is reasonable. I recommend seeing her back with blood work, 24-hour urine collection and skeletal survey to restage her before we plan on restarting her on treatment. She agreed. #  2 preventive care Skeletal survey showed no evidence of bone fracture. I recommend continue calcium and vitamin D supplement. The patient received intravenous Zometa in the past. We could consider restarting Zometa in the near future. I recommend she contact her primary care physician to inquire when her last pneumococcal vaccination was #3 history of breast cancer She will continue mammogram on yearly basis. She has no evidence of recurrence #4 anemia This is likely due to recent treatment. The patient denies recent history of bleeding such as epistaxis, hematuria or hematochezia. She is asymptomatic from the anemia. I will observe for now. This has improved since we hold Revlimid. #5 leukopenia, resolved #6 peripheral neuropathy This could be related to prior treatment. I recommend she take consistent dosage of gabapentin.  #7 mild bone pain I amnot certain whether this could be due to arthritis or disease. I recommend she continue conservative management and I will order a skeletal survey with her next visit.  Orders Placed This Encounter  Procedures  .  DG Bone Survey Met    Standing Status: Future     Number of Occurrences:      Standing Expiration Date: 12/13/2014    Order Specific Question:  Reason for Exam (SYMPTOM  OR DIAGNOSIS REQUIRED)    Answer:  staging myeloma    Order Specific Question:  Preferred imaging location?    Answer:  [facility]  . CBC with Differential    Standing Status: Future     Number of Occurrences:      Standing Expiration Date: 12/13/2014  . Comprehensive metabolic panel    Standing Status: Future     Number of Occurrences:      Standing Expiration Date: 12/13/2014  . SPEP & IFE with QIG    Standing Status: Future     Number of Occurrences:      Standing Expiration Date: 12/13/2014  . Kappa/lambda light chains    Standing Status: Future     Number of Occurrences:      Standing Expiration Date: 12/13/2014  . Protein Electro, 24-Hour Urine    Standing Status: Future     Number of Occurrences:      Standing Expiration Date: 12/13/2014  . IFE, Urine (with Tot Prot)    Standing Status: Future     Number of Occurrences:      Standing Expiration Date: 12/13/2014   All questions were answered. The patient knows to call the clinic with any problems, questions or concerns. No barriers to learning was detected. I spent 25 minutes counseling the patient face to face. The total time spent in the appointment was 30 minutes and more than 50% was on counseling and review of test results     Heath Lark, MD 10/13/2013 1:18 PM

## 2013-10-13 NOTE — Progress Notes (Signed)
Echocardiogram performed.  

## 2013-10-17 ENCOUNTER — Telehealth: Payer: Self-pay | Admitting: Hematology and Oncology

## 2013-10-17 NOTE — Telephone Encounter (Signed)
s/w pt re appts for lb 8/3 and NG 8/10. also confirmed xray already on schedule @ [facility] 8/3.

## 2013-11-24 ENCOUNTER — Ambulatory Visit: Payer: BC Managed Care – PPO | Admitting: Neurology

## 2014-02-05 ENCOUNTER — Ambulatory Visit (HOSPITAL_COMMUNITY)
Admission: RE | Admit: 2014-02-05 | Discharge: 2014-02-05 | Disposition: A | Discharge status: Home / Self Care | Payer: Medicare Other | Source: Ambulatory Visit | Attending: Hematology and Oncology | Admitting: Hematology and Oncology

## 2014-02-05 ENCOUNTER — Other Ambulatory Visit (HOSPITAL_BASED_OUTPATIENT_CLINIC_OR_DEPARTMENT_OTHER): Payer: Medicare Other

## 2014-02-05 DIAGNOSIS — C9000 Multiple myeloma not having achieved remission: Secondary | ICD-10-CM

## 2014-02-05 LAB — COMPREHENSIVE METABOLIC PANEL (CC13)
ALK PHOS: 70 U/L (ref 40–150)
ALT: 16 U/L (ref 0–55)
AST: 25 U/L (ref 5–34)
Albumin: 3.3 g/dL — ABNORMAL LOW (ref 3.5–5.0)
Anion Gap: 6 mEq/L (ref 3–11)
BILIRUBIN TOTAL: 0.29 mg/dL (ref 0.20–1.20)
BUN: 17.6 mg/dL (ref 7.0–26.0)
CO2: 29 meq/L (ref 22–29)
Calcium: 9.4 mg/dL (ref 8.4–10.4)
Chloride: 102 mEq/L (ref 98–109)
Creatinine: 0.9 mg/dL (ref 0.6–1.1)
Glucose: 77 mg/dl (ref 70–140)
Potassium: 4.2 mEq/L (ref 3.5–5.1)
SODIUM: 136 meq/L (ref 136–145)
TOTAL PROTEIN: 6.8 g/dL (ref 6.4–8.3)

## 2014-02-05 LAB — CBC WITH DIFFERENTIAL/PLATELET
BASO%: 0.6 % (ref 0.0–2.0)
Basophils Absolute: 0 10*3/uL (ref 0.0–0.1)
EOS%: 0.9 % (ref 0.0–7.0)
Eosinophils Absolute: 0 10*3/uL (ref 0.0–0.5)
HCT: 35.6 % (ref 34.8–46.6)
HGB: 11.7 g/dL (ref 11.6–15.9)
LYMPH#: 0.7 10*3/uL — AB (ref 0.9–3.3)
LYMPH%: 14.6 % (ref 14.0–49.7)
MCH: 32.9 pg (ref 25.1–34.0)
MCHC: 32.9 g/dL (ref 31.5–36.0)
MCV: 100 fL (ref 79.5–101.0)
MONO#: 0.3 10*3/uL (ref 0.1–0.9)
MONO%: 5.8 % (ref 0.0–14.0)
NEUT#: 3.8 10*3/uL (ref 1.5–6.5)
NEUT%: 78.1 % — ABNORMAL HIGH (ref 38.4–76.8)
PLATELETS: 231 10*3/uL (ref 145–400)
RBC: 3.56 10*6/uL — AB (ref 3.70–5.45)
RDW: 14.1 % (ref 11.2–14.5)
WBC: 4.9 10*3/uL (ref 3.9–10.3)

## 2014-02-07 LAB — SPEP & IFE WITH QIG
ALBUMIN ELP: 58.1 % (ref 55.8–66.1)
ALPHA-1-GLOBULIN: 4.5 % (ref 2.9–4.9)
ALPHA-2-GLOBULIN: 11.5 % (ref 7.1–11.8)
Beta 2: 2.5 % — ABNORMAL LOW (ref 3.2–6.5)
Beta Globulin: 6.1 % (ref 4.7–7.2)
Gamma Globulin: 17.3 % (ref 11.1–18.8)
IgA: 61 mg/dL — ABNORMAL LOW (ref 69–380)
IgG (Immunoglobin G), Serum: 1220 mg/dL (ref 690–1700)
IgM, Serum: 78 mg/dL (ref 52–322)
M-Spike, %: 0.83 g/dL
TOTAL PROTEIN, SERUM ELECTROPHOR: 6.5 g/dL (ref 6.0–8.3)

## 2014-02-07 LAB — KAPPA/LAMBDA LIGHT CHAINS
KAPPA FREE LGHT CHN: 5.45 mg/dL — AB (ref 0.33–1.94)
KAPPA LAMBDA RATIO: 4.62 — AB (ref 0.26–1.65)
Lambda Free Lght Chn: 1.18 mg/dL (ref 0.57–2.63)

## 2014-02-09 LAB — UPEP/TP, 24-HR URINE
COLLECTION INTERVAL: 24 h
TOTAL VOLUME, URINE: 1400 mL
Total Protein, Urine: <3 mg/dL

## 2014-02-09 LAB — UIFE/LIGHT CHAINS/TP QN, 24-HR UR
Albumin, U: DETECTED
Alpha 1, Urine: DETECTED — AB
Alpha 2, Urine: DETECTED — AB
BETA UR: DETECTED — AB
FREE LAMBDA LT CHAINS, UR: 0.11 mg/dL (ref 0.02–0.67)
Free Kappa Lt Chains,Ur: 12.2 mg/dL — ABNORMAL HIGH (ref 0.14–2.42)
Free Kappa/Lambda Ratio: 110.91 ratio — ABNORMAL HIGH (ref 2.04–10.37)
Free Lambda Excretion/Day: 1.54 mg/d
Free Lt Chn Excr Rate: 170.8 mg/d
Gamma Globulin, Urine: DETECTED — AB
Time: 24 hours
Total Protein, Urine-Ur/day: 179 mg/d — ABNORMAL HIGH (ref 10–140)
Total Protein, Urine: 12.8 mg/dL
VOLUME, URINE-UPE24: 1400 mL

## 2014-02-12 ENCOUNTER — Encounter: Payer: Self-pay | Admitting: Hematology and Oncology

## 2014-02-12 ENCOUNTER — Ambulatory Visit (HOSPITAL_BASED_OUTPATIENT_CLINIC_OR_DEPARTMENT_OTHER): Payer: Medicare Other | Admitting: Hematology and Oncology

## 2014-02-12 VITALS — BP 155/48 | HR 60 | Temp 98.2°F | Resp 18 | Ht 65.0 in | Wt 130.5 lb

## 2014-02-12 DIAGNOSIS — M129 Arthropathy, unspecified: Secondary | ICD-10-CM

## 2014-02-12 DIAGNOSIS — C9000 Multiple myeloma not having achieved remission: Secondary | ICD-10-CM

## 2014-02-12 MED ORDER — TRAMADOL HCL 50 MG PO TABS: 50.0000 mg | ORAL_TABLET | Freq: Four times a day (QID) | ORAL | Status: DC | PRN

## 2014-02-12 NOTE — Assessment & Plan Note (Signed)
She struggles with chronic arthritis. I recommend a trial of tramadol and warned her about risk of sedation, nausea and constipation.

## 2014-02-12 NOTE — Assessment & Plan Note (Signed)
She has mild progression of her M spike and serum light chains. Clinically, she is not symptomatic. She has no evidence of new bone lesions, anemia, renal dysfunction or hypercalcemia. I recommend continued observation with history, physical examination, and blood work in 4 months.

## 2014-02-12 NOTE — Progress Notes (Signed)
[place] OFFICE PROGRESS NOTE  Patient Care Team: Josue Hector, MD as PCP - General (Cardiology) Thressa Sheller, MD (Internal Medicine) Renella Cunas, MD as Attending Physician (Cardiology) Heath Lark, MD as Consulting Physician (Hematology and Oncology)  SUMMARY OF ONCOLOGIC HISTORY:   MULTIPLE  MYELOMA   11/12/2008 Imaging Skeletal survey showed lytic lesion in the right femur compatible with  myeloma. There were Questionable skull lesions   11/15/2008 Bone Marrow Biopsy BONE MARROW ASPIRATE AND BIOPSY: showed NORMOCELLULAR MARROW FOR AGE WITH PLASMA CELL DYSCRASIA  (PLASMA CELLS 25%). 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 Subsequently she was placed on Revlimid & Dexamethasone alone   05/12/2013 Bone Marrow Biopsy Bone marrow biopsy showed persistent plasma cells. Blood work confirmed VGPR status    INTERVAL HISTORY: Please see below for problem oriented charting. She is enjoying treatment holiday. She continued to struggle with arthritis pain on her back and has been taking MOBIC.  REVIEW OF SYSTEMS:   Constitutional: Denies fevers, chills or abnormal weight loss Eyes: Denies blurriness of vision Ears, nose, mouth, throat, and face: Denies mucositis or sore throat Respiratory: Denies cough, dyspnea or wheezes Cardiovascular: Denies palpitation, chest discomfort or lower extremity swelling Gastrointestinal:  Denies nausea, heartburn or change in bowel habits Skin: Denies abnormal skin rashes Lymphatics: Denies new lymphadenopathy or easy bruising Neurological:Denies numbness, tingling or new weaknesses Behavioral/Psych: Mood is stable, no new changes  All other systems were reviewed with the patient and are negative.  I have reviewed the past medical history, past surgical history, social history and family  history with the patient and they are unchanged from previous note.  ALLERGIES:  has No Known Allergies.  MEDICATIONS:  Current Outpatient Prescriptions  Medication Sig Dispense Refill  . aspirin 325 MG EC tablet Take 325 mg by mouth daily.      . cholecalciferol (VITAMIN D) 1000 UNITS tablet Take 1,000 Units by mouth daily.      [name] gabapentin (NEURONTIN) 300 MG capsule Take 1 capsule (300 mg total) by mouth 3 (three) times daily.  90 capsule  3  . levothyroxine (SYNTHROID, LEVOTHROID) 50 MCG tablet Take 50 mcg by mouth daily.      [name] loperamide (IMODIUM A-D) 2 MG tablet Take 2 mg by mouth as needed for diarrhea or loose stools (as directed.).      [name] meloxicam (MOBIC) 7.5 MG tablet Take 7.5 mg by mouth 2 (two) times daily as needed for pain.       . valsartan (DIOVAN) 160 MG tablet Take 1 tablet (160 mg total) by mouth daily.  30 tablet  6  . traMADol (ULTRAM) 50 MG tablet Take 1 tablet (50 mg total) by mouth every 6 (six) hours as needed for severe pain.  60 tablet  0   No current facility-administered medications for this visit.    PHYSICAL EXAMINATION: ECOG PERFORMANCE STATUS: 1 - Symptomatic but completely ambulatory  Filed Vitals:   02/12/14 1258  BP: 155/48  Pulse: 60  Temp: 98.2 F (36.8 C)  Resp: 18   Filed Weights   02/12/14 1258  Weight: 130 lb 8 oz (59.194 kg)    GENERAL:alert, no distress and comfortable SKIN: skin color, texture, turgor are normal, no rashes or significant lesions. She has small skin bruising EYES: normal, Conjunctiva are pink and non-injected, sclera clear OROPHARYNX:no exudate, no erythema and lips, buccal mucosa,  and tongue normal  NECK: supple, thyroid normal size, non-tender, without nodularity LYMPH:  no palpable lymphadenopathy in the cervical, axillary or inguinal LUNGS: clear to auscultation and percussion with normal breathing effort HEART: regular rate & rhythm and no murmurs and no lower extremity edema ABDOMEN:abdomen soft,  non-tender and normal bowel sounds Musculoskeletal:no cyanosis of digits and no clubbing  NEURO: alert & oriented x 3 with fluent speech, no focal motor/sensory deficits  LABORATORY DATA:  I have reviewed the data as listed    Component Value Date/Time   NA 136 02/05/2014 1225   NA 137 01/29/2012 1513   K 4.2 02/05/2014 1225   K 4.5 01/29/2012 1513   CL 103 11/25/2012 1328   CL 102 01/29/2012 1513   CO2 29 02/05/2014 1225   CO2 29 01/29/2012 1513   GLUCOSE 77 02/05/2014 1225   GLUCOSE 91 11/25/2012 1328   GLUCOSE 81 01/29/2012 1513   BUN 17.6 02/05/2014 1225   BUN 20 01/29/2012 1513   CREATININE 0.9 02/05/2014 1225   CREATININE 0.76 01/29/2012 1513   CALCIUM 9.4 02/05/2014 1225   CALCIUM 9.0 01/29/2012 1513   PROT 6.8 02/05/2014 1225   PROT 6.1 01/29/2012 1513   ALBUMIN 3.3* 02/05/2014 1225   ALBUMIN 3.5 01/29/2012 1513   AST 25 02/05/2014 1225   AST 25 01/29/2012 1513   ALT 16 02/05/2014 1225   ALT 16 01/29/2012 1513   ALKPHOS 70 02/05/2014 1225   ALKPHOS 68 01/29/2012 1513   BILITOT 0.29 02/05/2014 1225   BILITOT 0.3 01/29/2012 1513    No results found for this basename: SPEP, UPEP,  kappa and lambda light chains    Lab Results  Component Value Date   WBC 4.9 02/05/2014   NEUTROABS 3.8 02/05/2014   HGB 11.7 02/05/2014   HCT 35.6 02/05/2014   MCV 100.0 02/05/2014   PLT 231 02/05/2014      Chemistry      Component Value Date/Time   NA 136 02/05/2014 1225   NA 137 01/29/2012 1513   K 4.2 02/05/2014 1225   K 4.5 01/29/2012 1513   CL 103 11/25/2012 1328   CL 102 01/29/2012 1513   CO2 29 02/05/2014 1225   CO2 29 01/29/2012 1513   BUN 17.6 02/05/2014 1225   BUN 20 01/29/2012 1513   CREATININE 0.9 02/05/2014 1225   CREATININE 0.76 01/29/2012 1513      Component Value Date/Time   CALCIUM 9.4 02/05/2014 1225   CALCIUM 9.0 01/29/2012 1513   ALKPHOS 70 02/05/2014 1225   ALKPHOS 68 01/29/2012 1513   AST 25 02/05/2014 1225   AST 25 01/29/2012 1513   ALT 16 02/05/2014 1225   ALT 16 01/29/2012 1513   BILITOT 0.29 02/05/2014 1225    BILITOT 0.3 01/29/2012 1513       RADIOGRAPHIC STUDIES: Skeletal survey showed no new lesions I have personally reviewed the radiological images as listed and agreed with the findings in the report.  ASSESSMENT & PLAN:  MULTIPLE  MYELOMA She has mild progression of her M spike and serum light chains. Clinically, she is not symptomatic. She has no evidence of new bone lesions, anemia, renal dysfunction or hypercalcemia. I recommend continued observation with history, physical examination, and blood work in 4 months.  ARTHRITIS She struggles with chronic arthritis. I recommend a trial of tramadol and warned her about risk of sedation, nausea and constipation.   Orders Placed This Encounter  Procedures  . CBC with Differential    Standing  Status: Future     Number of Occurrences:      Standing Expiration Date: 03/19/2015  . Comprehensive metabolic panel    Standing Status: Future     Number of Occurrences:      Standing Expiration Date: 03/19/2015  . Lactate dehydrogenase    Standing Status: Future     Number of Occurrences:      Standing Expiration Date: 03/19/2015  . SPEP & IFE with QIG    Standing Status: Future     Number of Occurrences:      Standing Expiration Date: 03/19/2015  . Kappa/lambda light chains    Standing Status: Future     Number of Occurrences:      Standing Expiration Date: 03/19/2015  . Beta 2 microglobulin, serum    Standing Status: Future     Number of Occurrences:      Standing Expiration Date: 03/19/2015   All questions were answered. The patient knows to call the clinic with any problems, questions or concerns. No barriers to learning was detected. I spent 15 minutes counseling the patient face to face. The total time spent in the appointment was 20 minutes and more than 50% was on counseling and review of test results     [facility], [place], MD 02/12/2014 1:45 PM

## 2014-02-14 ENCOUNTER — Telehealth: Payer: Self-pay | Admitting: Hematology and Oncology

## 2014-02-14 NOTE — Telephone Encounter (Signed)
S/W PT GAVE APPTS FOR DEC. ALSO MAILED CALENDAR.

## 2014-03-05 ENCOUNTER — Telehealth: Payer: Self-pay | Admitting: *Deleted

## 2014-03-05 NOTE — Telephone Encounter (Signed)
Pt wanted to know if it is OK to take meloxicam occasionally. Dr Alvy Bimler says yes, it is OK

## 2014-03-23 ENCOUNTER — Telehealth: Payer: Self-pay | Admitting: *Deleted

## 2014-03-23 NOTE — Telephone Encounter (Signed)
Pt asked if ok to continue to take tramadol daily for pain. States it is really helping her pain and letting her perform her ADLs w/o too much difficulty.  She states she will run out before her next appt w/ Dr. Alvy Bimler in December.  Informed pt ok to take tramadol for pain as prescribed.  Call us when she has about one week left to get a refill. She verbalized understanding.

## 2014-04-03 ENCOUNTER — Other Ambulatory Visit: Payer: Self-pay | Admitting: Cardiovascular Disease

## 2014-04-23 ENCOUNTER — Telehealth: Payer: Self-pay | Admitting: Cardiovascular Disease

## 2014-04-23 NOTE — Telephone Encounter (Signed)
New message     Pt has been taking aleve for arthritis.  Is this ok to take being a heart patient?

## 2014-04-23 NOTE — Telephone Encounter (Signed)
LMTCB ./CY 

## 2014-04-23 NOTE — Telephone Encounter (Signed)
WILL FORWARD TO DR NISHAN FOR  REVIEW./CY 

## 2014-04-23 NOTE — Telephone Encounter (Signed)
Aleve is ok

## 2014-04-24 NOTE — Telephone Encounter (Signed)
Left message for pt to return call.

## 2014-04-24 NOTE — Telephone Encounter (Signed)
Left message for pt OK to take Aleve.  Requested she call back with questions or concerns.

## 2014-04-24 NOTE — Telephone Encounter (Signed)
Follow up     Returning a nurses call.  Please call before 2:30 or ok to leave a message on the ans machine

## 2014-04-24 NOTE — Telephone Encounter (Signed)
Line busy X 3.  Will continue to attempt to contact pt.

## 2014-04-26 ENCOUNTER — Other Ambulatory Visit: Payer: Self-pay | Admitting: *Deleted

## 2014-04-26 ENCOUNTER — Encounter: Payer: Self-pay | Admitting: *Deleted

## 2014-04-26 MED ORDER — MIRTAZAPINE 15 MG PO TABS: 15.0000 mg | ORAL_TABLET | Freq: Every day | ORAL | Status: DC

## 2014-04-26 NOTE — Telephone Encounter (Signed)
Pt called requesting an antidepressant. States she is depressed, can't get along during day and is having trouble sleeping. Dr Alvy Bimler prescribed Remeron and wants patient to call in 1 month to let us know how she is doing. Pt verbalized understanding

## 2014-05-02 ENCOUNTER — Other Ambulatory Visit: Payer: Self-pay | Admitting: Cardiovascular Disease

## 2014-05-08 ENCOUNTER — Telehealth: Payer: Self-pay | Admitting: *Deleted

## 2014-05-08 DIAGNOSIS — C9000 Multiple myeloma not having achieved remission: Secondary | ICD-10-CM

## 2014-05-08 DIAGNOSIS — M129 Arthropathy, unspecified: Secondary | ICD-10-CM

## 2014-05-08 MED ORDER — TRAMADOL HCL 50 MG PO TABS: 50.0000 mg | ORAL_TABLET | Freq: Four times a day (QID) | ORAL | Status: DC | PRN

## 2014-05-08 NOTE — Telephone Encounter (Signed)
Pt requests refill on Tramadol.  Called refill into [facility] pharmacy per Dr. Calton Dach order.  Notified pt and she verbalized understanding.

## 2014-06-06 ENCOUNTER — Telehealth: Payer: Self-pay | Admitting: Cardiovascular Disease

## 2014-06-06 NOTE — Telephone Encounter (Signed)
New msg   Spoke with pt she received flu shot around September 15th at [facility].

## 2014-06-11 ENCOUNTER — Other Ambulatory Visit (HOSPITAL_BASED_OUTPATIENT_CLINIC_OR_DEPARTMENT_OTHER): Payer: Medicare Other

## 2014-06-11 DIAGNOSIS — M129 Arthropathy, unspecified: Secondary | ICD-10-CM

## 2014-06-11 DIAGNOSIS — C9000 Multiple myeloma not having achieved remission: Secondary | ICD-10-CM

## 2014-06-11 LAB — COMPREHENSIVE METABOLIC PANEL (CC13)
ALT: 16 U/L (ref 0–55)
ANION GAP: 10 meq/L (ref 3–11)
AST: 22 U/L (ref 5–34)
Albumin: 3.5 g/dL (ref 3.5–5.0)
Alkaline Phosphatase: 63 U/L (ref 40–150)
BILIRUBIN TOTAL: 0.35 mg/dL (ref 0.20–1.20)
BUN: 19.8 mg/dL (ref 7.0–26.0)
CALCIUM: 9.3 mg/dL (ref 8.4–10.4)
CHLORIDE: 103 meq/L (ref 98–109)
CO2: 25 meq/L (ref 22–29)
CREATININE: 0.9 mg/dL (ref 0.6–1.1)
EGFR: 61 mL/min/{1.73_m2} — AB (ref >90)
Glucose: 107 mg/dl (ref 70–140)
Potassium: 4.4 mEq/L (ref 3.5–5.1)
Sodium: 138 mEq/L (ref 136–145)
Total Protein: 6.9 g/dL (ref 6.4–8.3)

## 2014-06-11 LAB — CBC WITH DIFFERENTIAL/PLATELET
BASO%: 0.5 % (ref 0.0–2.0)
Basophils Absolute: 0 10e3/uL (ref 0.0–0.1)
EOS%: 0.5 % (ref 0.0–7.0)
Eosinophils Absolute: 0 10e3/uL (ref 0.0–0.5)
HCT: 34.6 % — ABNORMAL LOW (ref 34.8–46.6)
HGB: 11.5 g/dL — ABNORMAL LOW (ref 11.6–15.9)
LYMPH%: 30.4 % (ref 14.0–49.7)
MCH: 32.9 pg (ref 25.1–34.0)
MCHC: 33.2 g/dL (ref 31.5–36.0)
MCV: 98.9 fL (ref 79.5–101.0)
MONO#: 0.2 10e3/uL (ref 0.1–0.9)
MONO%: 5.6 % (ref 0.0–14.0)
NEUT#: 2.4 10e3/uL (ref 1.5–6.5)
NEUT%: 63 % (ref 38.4–76.8)
Platelets: 208 10e3/uL (ref 145–400)
RBC: 3.5 10e6/uL — ABNORMAL LOW (ref 3.70–5.45)
RDW: 14.9 % — ABNORMAL HIGH (ref 11.2–14.5)
WBC: 3.8 10e3/uL — ABNORMAL LOW (ref 3.9–10.3)
lymph#: 1.2 10e3/uL (ref 0.9–3.3)

## 2014-06-11 LAB — LACTATE DEHYDROGENASE (CC13): LDH: 181 U/L (ref 125–245)

## 2014-06-13 LAB — SPEP & IFE WITH QIG
ALPHA-2-GLOBULIN: 10.2 % (ref 7.1–11.8)
Albumin ELP: 56.9 % (ref 55.8–66.1)
Alpha-1-Globulin: 3.5 % (ref 2.9–4.9)
BETA 2: 2.6 % — AB (ref 3.2–6.5)
Beta Globulin: 5 % (ref 4.7–7.2)
GAMMA GLOBULIN: 21.8 % — AB (ref 11.1–18.8)
IGA: 52 mg/dL — AB (ref 69–380)
IgG (Immunoglobin G), Serum: 1930 mg/dL — ABNORMAL HIGH (ref 690–1700)
IgM, Serum: 98 mg/dL (ref 52–322)
M-SPIKE, %: 1.23 g/dL
Total Protein, Serum Electrophoresis: 6.8 g/dL (ref 6.0–8.3)

## 2014-06-13 LAB — KAPPA/LAMBDA LIGHT CHAINS
Kappa free light chain: 9.16 mg/dL — ABNORMAL HIGH (ref 0.33–1.94)
Kappa:Lambda Ratio: 7.9 — ABNORMAL HIGH (ref 0.26–1.65)
LAMBDA FREE LGHT CHN: 1.16 mg/dL (ref 0.57–2.63)

## 2014-06-13 LAB — BETA 2 MICROGLOBULIN, SERUM: Beta-2 Microglobulin: 2.64 mg/L — ABNORMAL HIGH (ref <=2.51)

## 2014-06-14 ENCOUNTER — Other Ambulatory Visit: Payer: Self-pay | Admitting: Cardiovascular Disease

## 2014-06-18 ENCOUNTER — Encounter: Payer: Self-pay | Admitting: Hematology and Oncology

## 2014-06-18 ENCOUNTER — Telehealth: Payer: Self-pay | Admitting: Hematology and Oncology

## 2014-06-18 ENCOUNTER — Ambulatory Visit (HOSPITAL_BASED_OUTPATIENT_CLINIC_OR_DEPARTMENT_OTHER): Payer: Medicare Other | Admitting: Hematology and Oncology

## 2014-06-18 VITALS — BP 127/44 | HR 62 | Temp 97.8°F | Resp 18 | Ht 65.0 in | Wt 129.5 lb

## 2014-06-18 DIAGNOSIS — R0781 Pleurodynia: Secondary | ICD-10-CM

## 2014-06-18 DIAGNOSIS — M129 Arthropathy, unspecified: Secondary | ICD-10-CM

## 2014-06-18 DIAGNOSIS — G629 Polyneuropathy, unspecified: Secondary | ICD-10-CM

## 2014-06-18 DIAGNOSIS — C9000 Multiple myeloma not having achieved remission: Secondary | ICD-10-CM

## 2014-06-18 MED ORDER — TRAMADOL HCL 50 MG PO TABS: 50.0000 mg | ORAL_TABLET | Freq: Four times a day (QID) | ORAL | Status: DC | PRN

## 2014-06-18 NOTE — Telephone Encounter (Signed)
Gave avs & cal forDec. Sent mess to sch tx. °

## 2014-06-18 NOTE — Assessment & Plan Note (Signed)
This is due to previous side effects of Velcade. She will continue on gabapentin.

## 2014-06-18 NOTE — Assessment & Plan Note (Signed)
This is likely due to progression of disease. I refilled her prescription tramadol today.

## 2014-06-18 NOTE — Assessment & Plan Note (Signed)
She has signs and symptoms of disease progression. I recommend starting her on treatment with Revlimid and Elotuzumab I recommend chemotherapy education class and port placement. Due to peripheral neuropathy, I will not start her on any medication that could jeopardize that. I plan to see her back after Christmas for plan to start her treatment before the new year.

## 2014-06-18 NOTE — Progress Notes (Signed)
[place] OFFICE PROGRESS NOTE  Patient Care Team: Josue Hector, MD as PCP - General (Cardiology) Thressa Sheller, MD (Internal Medicine) Renella Cunas, MD as Attending Physician (Cardiology) Heath Lark, MD as Consulting Physician (Hematology and Oncology)  SUMMARY OF ONCOLOGIC HISTORY:   Multiple myeloma   11/12/2008 Imaging Skeletal survey showed lytic lesion in the right femur compatible with  myeloma. There were Questionable skull lesions   11/15/2008 Bone Marrow Biopsy BONE MARROW ASPIRATE AND BIOPSY: showed NORMOCELLULAR MARROW FOR AGE WITH PLASMA CELL DYSCRASIA  (PLASMA CELLS 25%). 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 Subsequently she was placed on Revlimid & Dexamethasone alone   05/12/2013 Bone Marrow Biopsy Bone marrow biopsy showed persistent plasma cells. Blood work confirmed VGPR status    INTERVAL HISTORY: Please see below for problem oriented charting. She returns today for further follow-up. She still has ongoing peripheral neuropathy, controlled with gabapentin. She complained of intermittent with pain and she requires the use of tramadol.  REVIEW OF SYSTEMS:   Constitutional: Denies fevers, chills or abnormal weight loss Eyes: Denies blurriness of vision Ears, nose, mouth, throat, and face: Denies mucositis or sore throat Respiratory: Denies cough, dyspnea or wheezes Cardiovascular: Denies palpitation, chest discomfort or lower extremity swelling Gastrointestinal:  Denies nausea, heartburn or change in bowel habits Skin: Denies abnormal skin rashes Lymphatics: Denies new lymphadenopathy or easy bruising Neurological:Denies numbness, tingling or new weaknesses Behavioral/Psych: Mood is stable, no new changes  All other systems were reviewed with the patient and are negative.  I have reviewed the  past medical history, past surgical history, social history and family history with the patient and they are unchanged from previous note.  ALLERGIES:  has No Known Allergies.  MEDICATIONS:  Current Outpatient Prescriptions  Medication Sig Dispense Refill  . aspirin 325 MG EC tablet Take 325 mg by mouth daily.    . cholecalciferol (VITAMIN D) 1000 UNITS tablet Take 1,000 Units by mouth daily.    [name] gabapentin (NEURONTIN) 300 MG capsule Take 1 capsule (300 mg total) by mouth 3 (three) times daily. 90 capsule 3  . levothyroxine (SYNTHROID, LEVOTHROID) 50 MCG tablet Take 50 mcg by mouth daily.    [name] loperamide (IMODIUM A-D) 2 MG tablet Take 2 mg by mouth as needed for diarrhea or loose stools (as directed.).    [name] meloxicam (MOBIC) 7.5 MG tablet Take 7.5 mg by mouth 2 (two) times daily as needed for pain.     . traMADol (ULTRAM) 50 MG tablet Take 1 tablet (50 mg total) by mouth every 6 (six) hours as needed for severe pain. 60 tablet 0  . valsartan (DIOVAN) 160 MG tablet TAKE ONE TABLET BY MOUTH ONE TIME DAILY  30 tablet 0   No current facility-administered medications for this visit.    PHYSICAL EXAMINATION: ECOG PERFORMANCE STATUS: 1 - Symptomatic but completely ambulatory  Filed Vitals:   06/18/14 1324  BP: 127/44  Pulse: 62  Temp: 97.8 F (36.6 C)  Resp: 18   Filed Weights   06/18/14 1324  Weight: 129 lb 8 oz (58.741 kg)    GENERAL:alert, no distress and comfortable. She is younger than stated age SKIN: skin color, texture, turgor are normal, no rashes or significant lesions EYES: normal, Conjunctiva are pink and non-injected, sclera clear OROPHARYNX:no exudate, no erythema and lips, buccal mucosa, and tongue normal  NECK: supple, thyroid normal  size, non-tender, without nodularity LYMPH:  no palpable lymphadenopathy in the cervical, axillary or inguinal LUNGS: clear to auscultation and percussion with normal breathing effort HEART: regular rate & rhythm and no murmurs and no  lower extremity edema ABDOMEN:abdomen soft, non-tender and normal bowel sounds Musculoskeletal:no cyanosis of digits and no clubbing  NEURO: alert & oriented x 3 with fluent speech, no focal motor/sensory deficits  LABORATORY DATA:  I have reviewed the data as listed    Component Value Date/Time   NA 138 06/11/2014 1327   NA 137 01/29/2012 1513   K 4.4 06/11/2014 1327   K 4.5 01/29/2012 1513   CL 103 11/25/2012 1328   CL 102 01/29/2012 1513   CO2 25 06/11/2014 1327   CO2 29 01/29/2012 1513   GLUCOSE 107 06/11/2014 1327   GLUCOSE 91 11/25/2012 1328   GLUCOSE 81 01/29/2012 1513   BUN 19.8 06/11/2014 1327   BUN 20 01/29/2012 1513   CREATININE 0.9 06/11/2014 1327   CREATININE 0.76 01/29/2012 1513   CALCIUM 9.3 06/11/2014 1327   CALCIUM 9.0 01/29/2012 1513   PROT 6.9 06/11/2014 1327   PROT 6.1 01/29/2012 1513   ALBUMIN 3.5 06/11/2014 1327   ALBUMIN 3.5 01/29/2012 1513   AST 22 06/11/2014 1327   AST 25 01/29/2012 1513   ALT 16 06/11/2014 1327   ALT 16 01/29/2012 1513   ALKPHOS 63 06/11/2014 1327   ALKPHOS 68 01/29/2012 1513   BILITOT 0.35 06/11/2014 1327   BILITOT 0.3 01/29/2012 1513    No results found for: SPEP, UPEP  Lab Results  Component Value Date   WBC 3.8* 06/11/2014   NEUTROABS 2.4 06/11/2014   HGB 11.5* 06/11/2014   HCT 34.6* 06/11/2014   MCV 98.9 06/11/2014   PLT 208 06/11/2014      Chemistry      Component Value Date/Time   NA 138 06/11/2014 1327   NA 137 01/29/2012 1513   K 4.4 06/11/2014 1327   K 4.5 01/29/2012 1513   CL 103 11/25/2012 1328   CL 102 01/29/2012 1513   CO2 25 06/11/2014 1327   CO2 29 01/29/2012 1513   BUN 19.8 06/11/2014 1327   BUN 20 01/29/2012 1513   CREATININE 0.9 06/11/2014 1327   CREATININE 0.76 01/29/2012 1513      Component Value Date/Time   CALCIUM 9.3 06/11/2014 1327   CALCIUM 9.0 01/29/2012 1513   ALKPHOS 63 06/11/2014 1327   ALKPHOS 68 01/29/2012 1513   AST 22 06/11/2014 1327   AST 25 01/29/2012 1513   ALT 16  06/11/2014 1327   ALT 16 01/29/2012 1513   BILITOT 0.35 06/11/2014 1327   BILITOT 0.3 01/29/2012 1513    ASSESSMENT & PLAN:  Multiple myeloma She has signs and symptoms of disease progression. I recommend starting her on treatment with Revlimid and Elotuzumab I recommend chemotherapy education class and port placement. Due to peripheral neuropathy, I will not start her on any medication that could jeopardize that. I plan to see her back after Christmas for plan to start her treatment before the new year.  Neuropathy This is due to previous side effects of Velcade. She will continue on gabapentin.  Rib pain on right side This is likely due to progression of disease. I refilled her prescription tramadol today.   Orders Placed This Encounter  Procedures  . IR Fluoro Guide CV Line Right    Indicate type of CVC ordering    Standing Status: Future     Number of  Occurrences:      Standing Expiration Date: 08/20/2015    Order Specific Question:  Reason for exam:    Answer:  port for chemo    Order Specific Question:  Preferred Imaging Location?    Answer:  [facility]   All questions were answered. The patient knows to call the clinic with any problems, questions or concerns. No barriers to learning was detected. I spent 40 minutes counseling the patient face to face. The total time spent in the appointment was 55 minutes and more than 50% was on counseling and review of test results     [facility], [place], MD 06/18/2014 3:21 PM

## 2014-06-21 ENCOUNTER — Other Ambulatory Visit: Payer: Medicare Other

## 2014-06-21 ENCOUNTER — Encounter: Payer: Self-pay | Admitting: *Deleted

## 2014-06-21 ENCOUNTER — Other Ambulatory Visit: Payer: Self-pay | Admitting: *Deleted

## 2014-06-21 MED ORDER — LIDOCAINE-PRILOCAINE 2.5-2.5 % EX CREA: 1.0000 "application " | TOPICAL_CREAM | CUTANEOUS | Status: DC | PRN

## 2014-06-22 ENCOUNTER — Other Ambulatory Visit: Payer: Self-pay | Admitting: Radiology

## 2014-06-25 ENCOUNTER — Other Ambulatory Visit: Payer: Self-pay | Admitting: Radiology

## 2014-06-25 NOTE — Telephone Encounter (Signed)
Not too low continue valsaran and monitor

## 2014-06-25 NOTE — Telephone Encounter (Signed)
Follow up      Pt is having a surgical procedure tomorrow.  Her bp is 127/44.  This was taken last wed or thurs for her pre-op workup.  Should she continue taking her bp medications?  Is this too low?

## 2014-06-25 NOTE — Telephone Encounter (Signed)
New Message  Pt called states that her BP dropped. She reports it was 114/84. Pt asks if it is safe for her to have a surgical procedure where she will be placed on Anesthesia being that her BP has dropped. She also asks if she should even continue her BP medication.  Pt asks that to please receive a call back today or before 11 am on 12/22.

## 2014-06-25 NOTE — Telephone Encounter (Signed)
SPOKE WITH PT  HAD  CONCERNS RE  B/P READING OF  127/44  FROM  PRE OP WORK UP   ENCOURAGED PT  TO  MONITOR  B/P  FOR  7-10 DAYS  AND   NOTES CONSISTENTLY LOW READINGS  TO CALL OFFICE  BACK  CURRENTLY ONLY TAKING  VALSARTAN   160 MG  EVERY DAY . WILL FORWARD TO DR Johnsie Cancel  FOR REVIEW /CY

## 2014-06-26 ENCOUNTER — Other Ambulatory Visit: Payer: Self-pay | Admitting: Hematology and Oncology

## 2014-06-26 ENCOUNTER — Other Ambulatory Visit: Payer: Medicare Other

## 2014-06-26 ENCOUNTER — Ambulatory Visit (HOSPITAL_COMMUNITY)
Admission: RE | Admit: 2014-06-26 | Discharge: 2014-06-26 | Disposition: A | Discharge status: Home / Self Care | Payer: Medicare Other | Source: Ambulatory Visit | Attending: Hematology and Oncology | Admitting: Hematology and Oncology

## 2014-06-26 ENCOUNTER — Encounter (HOSPITAL_COMMUNITY): Payer: Self-pay

## 2014-06-26 DIAGNOSIS — Z7982 Long term (current) use of aspirin: Secondary | ICD-10-CM | POA: Diagnosis not present

## 2014-06-26 DIAGNOSIS — Z452 Encounter for adjustment and management of vascular access device: Secondary | ICD-10-CM | POA: Diagnosis present

## 2014-06-26 DIAGNOSIS — Z853 Personal history of malignant neoplasm of breast: Secondary | ICD-10-CM | POA: Insufficient documentation

## 2014-06-26 DIAGNOSIS — Z79899 Other long term (current) drug therapy: Secondary | ICD-10-CM | POA: Insufficient documentation

## 2014-06-26 DIAGNOSIS — C9000 Multiple myeloma not having achieved remission: Secondary | ICD-10-CM

## 2014-06-26 DIAGNOSIS — Z9889 Other specified postprocedural states: Secondary | ICD-10-CM | POA: Diagnosis not present

## 2014-06-26 DIAGNOSIS — I10 Essential (primary) hypertension: Secondary | ICD-10-CM | POA: Diagnosis not present

## 2014-06-26 LAB — CBC WITH DIFFERENTIAL/PLATELET
BASOS ABS: 0 10*3/uL (ref 0.0–0.1)
Basophils Relative: 0 % (ref 0–1)
Eosinophils Absolute: 0 10*3/uL (ref 0.0–0.7)
Eosinophils Relative: 0 % (ref 0–5)
HEMATOCRIT: 33.7 % — AB (ref 36.0–46.0)
HEMOGLOBIN: 11.4 g/dL — AB (ref 12.0–15.0)
LYMPHS PCT: 35 % (ref 12–46)
Lymphs Abs: 1.1 10*3/uL (ref 0.7–4.0)
MCH: 33.2 pg (ref 26.0–34.0)
MCHC: 33.8 g/dL (ref 30.0–36.0)
MCV: 98.3 fL (ref 78.0–100.0)
Monocytes Absolute: 0.2 10*3/uL (ref 0.1–1.0)
Monocytes Relative: 7 % (ref 3–12)
NEUTROS ABS: 1.9 10*3/uL (ref 1.7–7.7)
Neutrophils Relative %: 58 % (ref 43–77)
Platelets: 211 10*3/uL (ref 150–400)
RBC: 3.43 MIL/uL — ABNORMAL LOW (ref 3.87–5.11)
RDW: 14.7 % (ref 11.5–15.5)
WBC: 3.2 10*3/uL — ABNORMAL LOW (ref 4.0–10.5)

## 2014-06-26 LAB — PROTIME-INR
INR: 0.97 (ref 0.00–1.49)
Prothrombin Time: 13 seconds (ref 11.6–15.2)

## 2014-06-26 MED ORDER — MIDAZOLAM HCL 2 MG/2ML IJ SOLN
INTRAMUSCULAR | Status: AC
  Filled 2014-06-26: qty 2

## 2014-06-26 MED ORDER — SODIUM CHLORIDE 0.9 % IV SOLN
INTRAVENOUS | Status: AC | PRN
  Administered 2014-06-26: 10 mL/h via INTRAVENOUS

## 2014-06-26 MED ORDER — FENTANYL CITRATE 0.05 MG/ML IJ SOLN
INTRAMUSCULAR | Status: AC
  Filled 2014-06-26: qty 2

## 2014-06-26 MED ORDER — CEFAZOLIN SODIUM-DEXTROSE 2-3 GM-% IV SOLR
2.0000 g | Freq: Once | INTRAVENOUS | Status: AC
  Administered 2014-06-26: 2 g via INTRAVENOUS

## 2014-06-26 MED ORDER — HEPARIN SOD (PORK) LOCK FLUSH 100 UNIT/ML IV SOLN
INTRAVENOUS | Status: DC
Start: 2014-06-26 — End: 2014-06-27
  Filled 2014-06-26: qty 5

## 2014-06-26 MED ORDER — LIDOCAINE-EPINEPHRINE 2 %-1:100000 IJ SOLN
INTRAMUSCULAR | Status: AC
  Filled 2014-06-26: qty 1

## 2014-06-26 MED ORDER — FENTANYL CITRATE 0.05 MG/ML IJ SOLN
INTRAMUSCULAR | Status: AC | PRN
  Administered 2014-06-26: 50 ug via INTRAVENOUS

## 2014-06-26 MED ORDER — SODIUM CHLORIDE 0.9 % IV SOLN
INTRAVENOUS | Status: DC
  Administered 2014-06-26: 13:00:00 via INTRAVENOUS

## 2014-06-26 MED ORDER — MIDAZOLAM HCL 2 MG/2ML IJ SOLN
INTRAMUSCULAR | Status: AC | PRN
  Administered 2014-06-26: 1 mg via INTRAVENOUS

## 2014-06-26 MED ORDER — CEFAZOLIN SODIUM-DEXTROSE 2-3 GM-% IV SOLR
INTRAVENOUS | Status: AC
  Administered 2014-06-26: 2 g via INTRAVENOUS
  Filled 2014-06-26: qty 50

## 2014-06-26 NOTE — Telephone Encounter (Signed)
Left message  Will call later  Per pt's husband  Request .Adonis Housekeeper

## 2014-06-26 NOTE — Procedures (Signed)
Successful placement of right IJ approach port-a-cath with tip at the superior caval atrial junction. The catheter is ready for immediate use. No immediate post procedural complications. 

## 2014-06-26 NOTE — Sedation Documentation (Signed)
Patient denies pain and is resting comfortably.  

## 2014-06-26 NOTE — Telephone Encounter (Signed)
LMTCB ./CY 

## 2014-06-26 NOTE — Discharge Instructions (Signed)
Implanted Port Insertion, Care After °Refer to this sheet in the next few weeks. These instructions provide you with information on caring for yourself after your procedure. Your health care provider may also give you more specific instructions. Your treatment has been planned according to current medical practices, but problems sometimes occur. Call your health care provider if you have any problems or questions after your procedure. °WHAT TO EXPECT AFTER THE PROCEDURE °After your procedure, it is typical to have the following:  °· Discomfort at the port insertion site. Ice packs to the area will help. °· Bruising on the skin over the port. This will subside in 3-4 days. °HOME CARE INSTRUCTIONS °· After your port is placed, you will get a manufacturer's information card. The card has information about your port. Keep this card with you at all times.   °· Know what kind of port you have. There are many types of ports available.   °· Wear a medical alert bracelet in case of an emergency. This can help alert health care workers that you have a port.   °· The port can stay in for as long as your health care provider believes it is necessary.   °· A home health care nurse may give medicines and take care of the port.   °· You or a family member can get special training and directions for giving medicine and taking care of the port at home.   °SEEK MEDICAL CARE IF:  °· Your port does not flush or you are unable to get a blood return.   °· You have a fever or chills. °SEEK IMMEDIATE MEDICAL CARE IF: °· You have new fluid or pus coming from your incision.   °· You notice a bad smell coming from your incision site.   °· You have swelling, pain, or more redness at the incision or port site.   °· You have chest pain or shortness of breath. °Document Released: 04/12/2013 Document Revised: 06/27/2013 Document Reviewed: 04/12/2013 °ExitCare® Patient Information ©2015 [employer]. This information is not intended to replace  advice given to you by your health care provider. Make sure you discuss any questions you have with your health care provider. °Conscious Sedation, Adult, Care After °Refer to this sheet in the next few weeks. These instructions provide you with information on caring for yourself after your procedure. Your health care provider may also give you more specific instructions. Your treatment has been planned according to current medical practices, but problems sometimes occur. Call your health care provider if you have any problems or questions after your procedure. °WHAT TO EXPECT AFTER THE PROCEDURE  °After your procedure: °· You may feel sleepy, clumsy, and have poor balance for several hours. °· Vomiting may occur if you eat too soon after the procedure. °HOME CARE INSTRUCTIONS °· Do not participate in any activities where you could become injured for at least 24 hours. Do not: °¨ Drive. °¨ Swim. °¨ Ride a bicycle. °¨ Operate heavy machinery. °¨ Cook. °¨ Use power tools. °¨ Climb ladders. °¨ Work from a high place. °· Do not make important decisions or sign legal documents until you are improved. °· If you vomit, drink water, juice, or soup when you can drink without vomiting. Make sure you have little or no nausea before eating solid foods. °· Only take over-the-counter or prescription medicines for pain, discomfort, or fever as directed by your health care provider. °· Make sure you and your family fully understand everything about the medicines given to you, including what side effects   may occur. °· You should not drink alcohol, take sleeping pills, or take medicines that cause drowsiness for at least 24 hours. °· If you smoke, do not smoke without supervision. °· If you are feeling better, you may resume normal activities 24 hours after you were sedated. °· Keep all appointments with your health care provider. °SEEK MEDICAL CARE IF: °· Your skin is pale or bluish in color. °· You continue to feel nauseous or  vomit. °· Your pain is getting worse and is not helped by medicine. °· You have bleeding or swelling. °· You are still sleepy or feeling clumsy after 24 hours. °SEEK IMMEDIATE MEDICAL CARE IF: °· You develop a rash. °· You have difficulty breathing. °· You develop any type of allergic problem. °· You have a fever. °MAKE SURE YOU: °· Understand these instructions. °· Will watch your condition. °· Will get help right away if you are not doing well or get worse. °Document Released: 04/12/2013 Document Reviewed: 04/12/2013 °ExitCare® Patient Information ©2015 [employer]. This information is not intended to replace advice given to you by your health care provider. Make sure you discuss any questions you have with your health care provider. ° ° °

## 2014-06-26 NOTE — H&P (Signed)
Chief Complaint: "I am here to have a port placed."  Referring Physician(s): Gorsuch,Ni  History of Present Illness: Natasha Jackson is a 80 y.o. female with multiple myeloma who follows with Dr. Alvy Bimler and is scheduled today for a port a catheter placement. She denies any chest pain, shortness of breath or palpitations. She denies any active signs of bleeding or excessive bruising. She denies any recent fever or chills. The patient denies any history of sleep apnea or chronic oxygen use. She has previously tolerated sedation without complications.   Past Medical History  Diagnosis Date  . Breast cancer   . Multiple myeloma   . Aortic regurgitation   . HTN (hypertension)   . Hot flash not due to menopause 12/23/2011  . Neuropathy 12/23/2011  . DCIS (ductal carcinoma in situ) 08/04/2012    Past Surgical History  Procedure Laterality Date  . Lymph node excision - left groin    . Incisional hernia repair      Allergies: Review of patient's allergies indicates no known allergies.  Medications: Prior to Admission medications   Medication Sig Start Date End Date Taking? Authorizing Provider  acetaminophen (TYLENOL) 325 MG tablet Take 650 mg by mouth every 6 (six) hours as needed.   Yes Historical Provider, MD  aspirin 325 MG EC tablet Take 325 mg by mouth daily.   Yes Historical Provider, MD  cholecalciferol (VITAMIN D) 1000 UNITS tablet Take 1,000 Units by mouth daily. 05/01/13  Yes Heath Lark, MD  gabapentin (NEURONTIN) 300 MG capsule Take 1 capsule (300 mg total) by mouth 3 (three) times daily. Patient taking differently: Take 300 mg by mouth at bedtime.  10/13/13  Yes Heath Lark, MD  levothyroxine (SYNTHROID, LEVOTHROID) 50 MCG tablet Take 50 mcg by mouth daily.   Yes Historical Provider, MD  lidocaine-prilocaine (EMLA) cream Apply 1 application topically as needed. Apply to [facility] a cath site at least one hour prior to [employer]. 06/21/14  Yes Heath Lark, MD  loperamide  (IMODIUM A-D) 2 MG tablet Take 2 mg by mouth as needed for diarrhea or loose stools (as directed.).   Yes Historical Provider, MD  meloxicam (MOBIC) 7.5 MG tablet Take 7.5 mg by mouth 2 (two) times daily as needed for pain.  05/06/11  Yes Historical Provider, MD  traMADol (ULTRAM) 50 MG tablet Take 1 tablet (50 mg total) by mouth every 6 (six) hours as needed for severe pain. 06/18/14  Yes Heath Lark, MD  valsartan (DIOVAN) 160 MG tablet Take 160 mg by mouth at bedtime.   Yes Historical Provider, MD  valsartan (DIOVAN) 160 MG tablet TAKE ONE TABLET BY MOUTH ONE TIME DAILY  Patient not taking: Reported on 06/25/2014 06/14/14   Josue Hector, MD    Family History  Problem Relation Age of Onset  . Heart failure Mother   . Stroke Father     History   Social History  . Marital Status: Married    Spouse Name: N/A    Number of Children: N/A  . Years of Education: N/A   Social History Main Topics  . Smoking status: Never Smoker   . Smokeless tobacco: Never Used  . Alcohol Use: No  . Drug Use: No  . Sexual Activity: None   Other Topics Concern  . None   Social History Narrative    Review of Systems: A 12 point ROS discussed and pertinent positives are indicated in the HPI above.  All other systems are negative.  Review of Systems  Vital Signs: BP 157/52 mmHg  Pulse 52  Temp(Src) 97.8 F (36.6 C) (Oral)  Resp 18  Ht 5' 5"  (1.651 m)  Wt 128 lb (58.06 kg)  BMI 21.30 kg/m2  SpO2 100%  Physical Exam  Constitutional: She is oriented to person, place, and time. No distress.  HENT:  Head: Normocephalic and atraumatic.  Cardiovascular: Normal rate and regular rhythm.  Exam reveals no gallop and no friction rub.   No murmur heard. Pulmonary/Chest: Effort normal and breath sounds normal. No respiratory distress. She has no wheezes. She has no rales.  Abdominal: Soft. Bowel sounds are normal. She exhibits no distension. There is no tenderness.  Neurological: She is alert and  oriented to person, place, and time.  Skin: She is not diaphoretic.    Imaging: No results found.  Labs:  CBC:  Recent Labs  10/06/13 1412 02/05/14 1224 06/11/14 1326 06/26/14 1250  WBC 4.4 4.9 3.8* 3.2*  HGB 11.2* 11.7 11.5* 11.4*  HCT 33.5* 35.6 34.6* 33.7*  PLT 202 231 208 211    COAGS:  Recent Labs  06/26/14 1250  INR 0.97    BMP:  Recent Labs  06/30/13 1410 10/06/13 1412 02/05/14 1225 06/11/14 1327  NA 138 138 136 138  K 4.4 4.1 4.2 4.4  CO2 26 22 29 25   GLUCOSE 99 132 77 107  BUN 18.9 21.4 17.6 19.8  CALCIUM 9.2 9.2 9.4 9.3  CREATININE 0.9 0.8 0.9 0.9    LIVER FUNCTION TESTS:  Recent Labs  06/30/13 1410 10/06/13 1412 02/05/14 1225 06/11/14 1327  BILITOT 0.42 0.35 0.29 0.35  AST 25 26 25 22   ALT 20 18 16 16   ALKPHOS 71 70 70 63  PROT 6.4 6.5 6.8 6.9  ALBUMIN 3.6 3.6 3.3* 3.5    Assessment and Plan: Multiple myeloma Scheduled today for image guided port a catheter placement with moderate sedation Patient has been NPO, no blood thinners taken, afebrile, labs reviewed Risks and Benefits discussed with the patient. All of the patient's questions were answered, patient is agreeable to proceed. Consent signed and in chart. History of breast cancer s/p right lumpectomy 2009 with radiation.      SignedHedy Jacob 06/26/2014, 1:43 PM

## 2014-06-28 ENCOUNTER — Telehealth: Payer: Self-pay | Admitting: *Deleted

## 2014-06-28 NOTE — Telephone Encounter (Signed)
Pt called and stated she has had very bad hot flashes, and kept her up at night.  Pt stated Dr. Alvy Bimler had prescribed different agents for hot flashes but pt was so sick from these medications ( Effexor and 2 other meds ).  Pt stated her friend has been taking Lorazepam 0.5mg   For hot flashes with great relief.  Pt wanted to know if Dr. Alvy Bimler would prescribe Lorazepam for pt to help with hot flashes problem.  Informed pt that Dr. Alvy Bimler is not in office today;  Message will be relayed to another provider.  Pt voiced understanding. Pt's  Phone   [phone].

## 2014-06-28 NOTE — Telephone Encounter (Signed)
Spoke with Burnetta Sabin, PA of above info.  Called pt and informed her to discuss hot flashes issue again with Dr. Alvy Bimler at her next office visit on 12/31.  Pt voiced understanding.

## 2014-07-03 ENCOUNTER — Other Ambulatory Visit: Payer: Self-pay | Admitting: Hematology and Oncology

## 2014-07-03 DIAGNOSIS — C9000 Multiple myeloma not having achieved remission: Secondary | ICD-10-CM

## 2014-07-03 NOTE — Telephone Encounter (Signed)
PT  AWARE./CY 

## 2014-07-04 ENCOUNTER — Other Ambulatory Visit (HOSPITAL_BASED_OUTPATIENT_CLINIC_OR_DEPARTMENT_OTHER): Payer: Medicare Other

## 2014-07-04 DIAGNOSIS — C9000 Multiple myeloma not having achieved remission: Secondary | ICD-10-CM

## 2014-07-04 LAB — COMPREHENSIVE METABOLIC PANEL (CC13)
ALK PHOS: 66 U/L (ref 40–150)
ALT: 17 U/L (ref 0–55)
AST: 24 U/L (ref 5–34)
Albumin: 3.6 g/dL (ref 3.5–5.0)
Anion Gap: 7 mEq/L (ref 3–11)
BILIRUBIN TOTAL: 0.4 mg/dL (ref 0.20–1.20)
BUN: 19.1 mg/dL (ref 7.0–26.0)
CO2: 27 mEq/L (ref 22–29)
CREATININE: 0.8 mg/dL (ref 0.6–1.1)
Calcium: 8.9 mg/dL (ref 8.4–10.4)
Chloride: 102 mEq/L (ref 98–109)
EGFR: 65 mL/min/{1.73_m2} — ABNORMAL LOW (ref >90)
Glucose: 99 mg/dl (ref 70–140)
Potassium: 4.1 mEq/L (ref 3.5–5.1)
Sodium: 136 mEq/L (ref 136–145)
Total Protein: 7.1 g/dL (ref 6.4–8.3)

## 2014-07-04 LAB — URIC ACID (CC13): Uric Acid, Serum: 4.4 mg/dl (ref 2.6–7.4)

## 2014-07-04 LAB — CBC WITH DIFFERENTIAL/PLATELET
BASO%: 0.4 % (ref 0.0–2.0)
Basophils Absolute: 0 10*3/uL (ref 0.0–0.1)
EOS ABS: 0 10*3/uL (ref 0.0–0.5)
EOS%: 0.5 % (ref 0.0–7.0)
HCT: 35.6 % (ref 34.8–46.6)
HGB: 11.4 g/dL — ABNORMAL LOW (ref 11.6–15.9)
LYMPH%: 27 % (ref 14.0–49.7)
MCH: 32.4 pg (ref 25.1–34.0)
MCHC: 32 g/dL (ref 31.5–36.0)
MCV: 101.2 fL — ABNORMAL HIGH (ref 79.5–101.0)
MONO#: 0.2 10*3/uL (ref 0.1–0.9)
MONO%: 5.4 % (ref 0.0–14.0)
NEUT%: 66.7 % (ref 38.4–76.8)
NEUTROS ABS: 2.7 10*3/uL (ref 1.5–6.5)
Platelets: 218 10*3/uL (ref 145–400)
RBC: 3.51 10*6/uL — AB (ref 3.70–5.45)
RDW: 14.4 % (ref 11.2–14.5)
WBC: 4 10*3/uL (ref 3.9–10.3)
lymph#: 1.1 10*3/uL (ref 0.9–3.3)

## 2014-07-04 LAB — LACTATE DEHYDROGENASE (CC13): LDH: 177 U/L (ref 125–245)

## 2014-07-05 ENCOUNTER — Telehealth: Payer: Self-pay | Admitting: Hematology and Oncology

## 2014-07-05 ENCOUNTER — Encounter: Payer: Self-pay | Admitting: Hematology and Oncology

## 2014-07-05 ENCOUNTER — Ambulatory Visit (HOSPITAL_BASED_OUTPATIENT_CLINIC_OR_DEPARTMENT_OTHER): Payer: Medicare Other

## 2014-07-05 ENCOUNTER — Ambulatory Visit (HOSPITAL_BASED_OUTPATIENT_CLINIC_OR_DEPARTMENT_OTHER): Payer: Medicare Other | Admitting: Hematology and Oncology

## 2014-07-05 VITALS — BP 144/51 | HR 58 | Temp 97.5°F | Resp 18 | Ht 65.0 in | Wt 130.2 lb

## 2014-07-05 DIAGNOSIS — C9000 Multiple myeloma not having achieved remission: Secondary | ICD-10-CM

## 2014-07-05 DIAGNOSIS — Z5112 Encounter for antineoplastic immunotherapy: Secondary | ICD-10-CM | POA: Diagnosis not present

## 2014-07-05 DIAGNOSIS — T451X5A Adverse effect of antineoplastic and immunosuppressive drugs, initial encounter: Secondary | ICD-10-CM

## 2014-07-05 DIAGNOSIS — D61810 Antineoplastic chemotherapy induced pancytopenia: Secondary | ICD-10-CM | POA: Insufficient documentation

## 2014-07-05 DIAGNOSIS — D630 Anemia in neoplastic disease: Secondary | ICD-10-CM

## 2014-07-05 MED ORDER — SODIUM CHLORIDE 0.9 % IJ SOLN
10.0000 mL | INTRAMUSCULAR | Status: DC | PRN
  Administered 2014-07-05: 10 mL
  Filled 2014-07-05: qty 10

## 2014-07-05 MED ORDER — FAMOTIDINE IN NACL 20-0.9 MG/50ML-% IV SOLN
20.0000 mg | Freq: Once | INTRAVENOUS | Status: AC
  Administered 2014-07-05: 20 mg via INTRAVENOUS

## 2014-07-05 MED ORDER — DEXAMETHASONE SODIUM PHOSPHATE 10 MG/ML IJ SOLN
INTRAMUSCULAR | Status: AC
  Filled 2014-07-05: qty 1

## 2014-07-05 MED ORDER — SODIUM CHLORIDE 0.9 % IV SOLN
Freq: Once | INTRAVENOUS | Status: AC
  Administered 2014-07-05: 14:00:00 via INTRAVENOUS
  Filled 2014-07-05: qty 230

## 2014-07-05 MED ORDER — SODIUM CHLORIDE 0.9 % IV SOLN
INTRAVENOUS | Status: DC
  Administered 2014-07-05: 13:00:00 via INTRAVENOUS

## 2014-07-05 MED ORDER — DIPHENHYDRAMINE HCL 25 MG PO CAPS
50.0000 mg | ORAL_CAPSULE | Freq: Once | ORAL | Status: AC
  Administered 2014-07-05: 50 mg via ORAL

## 2014-07-05 MED ORDER — FAMOTIDINE IN NACL 20-0.9 MG/50ML-% IV SOLN
INTRAVENOUS | Status: AC
  Filled 2014-07-05: qty 50

## 2014-07-05 MED ORDER — ACETAMINOPHEN 325 MG PO TABS
ORAL_TABLET | ORAL | Status: AC
  Filled 2014-07-05: qty 2

## 2014-07-05 MED ORDER — ACETAMINOPHEN 325 MG PO TABS
650.0000 mg | ORAL_TABLET | Freq: Once | ORAL | Status: AC
  Administered 2014-07-05: 650 mg via ORAL

## 2014-07-05 MED ORDER — DEXAMETHASONE SODIUM PHOSPHATE 10 MG/ML IJ SOLN
10.0000 mg | Freq: Once | INTRAMUSCULAR | Status: AC
  Administered 2014-07-05: 10 mg via INTRAVENOUS

## 2014-07-05 MED ORDER — HEPARIN SOD (PORK) LOCK FLUSH 100 UNIT/ML IV SOLN
500.0000 [IU] | Freq: Once | INTRAVENOUS | Status: AC | PRN
  Administered 2014-07-05: 500 [IU]
  Filled 2014-07-05: qty 5

## 2014-07-05 NOTE — Assessment & Plan Note (Addendum)
The decision was made based on recent publication on NEJM. Role of treatment is palliative.  Elotuzumab Therapy for Relapsed or Refractory Multiple Myeloma Almyra Free, M.D., Meletios Dimopoulos, M.D., Lucienne Capers, M.D., Cephus Shelling, M.D., Laurin Coder, M.D., Ph.D., Mickel Crow, M.D., Rene Kocher, M.D., Stacie Acres, M.D., Maria-Victoria Annett Fabian, M.D., Ph.D., Hila Hammers Bower, M.D., Jodelle Green, M.D., Arvil Persons, M.D., Rolanda Jay, M.D., Judie Bonus, M.D., Anselmo Rod, M.D., Gasper Sells. Marisa Hua, M.D., Bertis Ruddy, M.D., Signa Kell, M.D., Katrina Stack, M.D., Freda Jackson, M.D., Tawanna Sat, Ph.D., Milagros Evener, M.D., Gerrie Nordmann, M.D., Alain Marion, M.D., Ph.D., Cyd Silence, M.D., Clearnce Hasten, M.[place]., Nadean Corwin. Ouida Sills, M.D., and Renaldo Harrison, M.D. for the [facility] Investigators N Engl J Med [phone] 13, 2015DOI: 10.1056/[id number]  Elotuzumab, an immunostimulatory monoclonal antibody targeting signaling lymphocytic activation molecule F7 (SLAMF7), showed activity in combination with lenalidomide and dexamethasone.  After a median follow-up of 24.5 months, the rate of progression-free survival at 1 year in the elotuzumab group was 68%. Median progression-free survival in the elotuzumab group was 19.4 months. The overall response rate in the elotuzumab group was 79%. Common grade 3 or 4 adverse events in the two groups were lymphocytopenia, neutropenia, fatigue, and pneumonia. Infusion reactions occurred in 33 patients (10%) in the elotuzumab group and were grade 1 or 2 in 29 patients. Some of the short term side-effects included, though not limited to, risk of fatigue, weight loss, tumor lysis syndrome, risk of allergic reactions, pancytopenia, life-threatening infections, need for transfusions of blood products, admission to hospital for various reasons, and risks of death.   The patient is aware that the response rates  discussed earlier is not guaranteed.    After a long discussion, patient made an informed decision to proceed with the prescribed plan of care.  The patient will need dental evaluation before prescription Zometa.

## 2014-07-05 NOTE — Assessment & Plan Note (Signed)
This is likely anemia of chronic disease. The patient denies recent history of bleeding such as epistaxis, hematuria or hematochezia. She is asymptomatic from the anemia. We will observe for now.  She does not require transfusion now.   

## 2014-07-05 NOTE — Patient Instructions (Signed)
[facility] Discharge Instructions for Patients Receiving Chemotherapy  Today you received the following chemotherapy agents Elotuzumab.  To help prevent nausea and vomiting after your treatment, we encourage you to take your nausea medication as prescribed.   If you develop nausea and vomiting that is not controlled by your nausea medication, call the clinic.   BELOW ARE SYMPTOMS THAT SHOULD BE REPORTED IMMEDIATELY:  *FEVER GREATER THAN 100.5 F  *CHILLS WITH OR WITHOUT FEVER  NAUSEA AND VOMITING THAT IS NOT CONTROLLED WITH YOUR NAUSEA MEDICATION  *UNUSUAL SHORTNESS OF BREATH  *UNUSUAL BRUISING OR BLEEDING  TENDERNESS IN MOUTH AND THROAT WITH OR WITHOUT PRESENCE OF ULCERS  *URINARY PROBLEMS  *BOWEL PROBLEMS  UNUSUAL RASH Items with * indicate a potential emergency and should be followed up as soon as possible.  Feel free to call the clinic you have any questions or concerns. The clinic phone number is [phone].    

## 2014-07-05 NOTE — Telephone Encounter (Signed)
gv and printed appt sched and avs foer pt for Jan 2016....sed added tx.

## 2014-07-05 NOTE — Progress Notes (Signed)
[facility] OFFICE PROGRESS NOTE  Patient Care Team: Josue Hector, MD as PCP - General (Cardiology) Thressa Sheller, MD (Internal Medicine) Renella Cunas, MD as Attending Physician (Cardiology) Heath Lark, MD as Consulting Physician (Hematology and Oncology)  SUMMARY OF ONCOLOGIC HISTORY: Oncology History   The     Multiple myeloma   11/12/2008 Imaging Skeletal survey showed lytic lesion in the right femur compatible with  myeloma. There were Questionable skull lesions   11/15/2008 Bone Marrow Biopsy BONE MARROW ASPIRATE AND BIOPSY: showed NORMOCELLULAR MARROW FOR AGE WITH PLASMA CELL DYSCRASIA  (PLASMA CELLS 25%). 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 Subsequently she was placed on Revlimid & Dexamethasone alone   05/12/2013 Bone Marrow Biopsy Bone marrow biopsy showed persistent plasma cells. Blood work confirmed VGPR status   06/11/2014 Tumor Marker Bloodwork show disease relapse   06/26/2014 Procedure She has placement of port   07/05/2014 -  Chemotherapy She received Elotuzumab and Revlimid    INTERVAL HISTORY: Please see below for problem oriented charting. She returns today prior to the start of treatment. She complained of mild tenderness at the port placement site. She denies recent infection. Denies recent bone pain.  REVIEW OF SYSTEMS:   Constitutional: Denies fevers, chills or abnormal weight loss Eyes: Denies blurriness of vision Ears, nose, mouth, throat, and face: Denies mucositis or sore throat Respiratory: Denies cough, dyspnea or wheezes Cardiovascular: Denies palpitation, chest discomfort or lower extremity swelling Gastrointestinal:  Denies nausea, heartburn or change in bowel habits Skin: Denies abnormal skin rashes Lymphatics: Denies new lymphadenopathy or easy bruising Neurological:Denies  numbness, tingling or new weaknesses Behavioral/Psych: Mood is stable, no new changes  All other systems were reviewed with the patient and are negative.  I have reviewed the past medical history, past surgical history, social history and family history with the patient and they are unchanged from previous note.  ALLERGIES:  has No Known Allergies.  MEDICATIONS:  Current Outpatient Prescriptions  Medication Sig Dispense Refill  . acetaminophen (TYLENOL) 325 MG tablet Take 650 mg by mouth every 6 (six) hours as needed.    [name] aspirin 325 MG EC tablet Take 325 mg by mouth daily.    . cholecalciferol (VITAMIN D) 1000 UNITS tablet Take 1,000 Units by mouth daily.    . diphenhydramine-acetaminophen (TYLENOL PM) 25-500 MG TABS Take 1 tablet by mouth at bedtime as needed.    . gabapentin (NEURONTIN) 300 MG capsule Take 1 capsule (300 mg total) by mouth 3 (three) times daily. (Patient taking differently: Take 300 mg by mouth at bedtime. ) 90 capsule 3  . levothyroxine (SYNTHROID, LEVOTHROID) 50 MCG tablet Take 50 mcg by mouth daily.    [name] lidocaine-prilocaine (EMLA) cream Apply 1 application topically as needed. Apply to [facility] a cath site at least one hour prior to [employer]. 30 g 2  . loperamide (IMODIUM A-D) 2 MG tablet Take 2 mg by mouth as needed for diarrhea or loose stools (as directed.).    [name] meloxicam (MOBIC) 7.5 MG tablet Take 7.5 mg by mouth 2 (two) times daily as needed for pain.     . traMADol (ULTRAM) 50 MG tablet Take 1 tablet (50 mg total) by mouth every 6 (six) hours as needed for severe pain. 60 tablet 0  . valsartan (DIOVAN) 160 MG tablet Take 160 mg by mouth at bedtime.  No current facility-administered medications for this visit.    PHYSICAL EXAMINATION: ECOG PERFORMANCE STATUS: 0 - Asymptomatic  Filed Vitals:   07/05/14 1115  BP: 144/51  Pulse: 58  Temp: 97.5 F (36.4 C)  Resp: 18   Filed Weights   07/05/14 1115  Weight: 130 lb 3.2 oz (59.058 kg)     GENERAL:alert, no distress and comfortable SKIN: skin color, texture, turgor are normal, no rashes or significant lesions EYES: normal, Conjunctiva are pink and non-injected, sclera clear Musculoskeletal:no cyanosis of digits and no clubbing . The port site looks okay NEURO: alert & oriented x 3 with fluent speech, no focal motor/sensory deficits  LABORATORY DATA:  I have reviewed the data as listed    Component Value Date/Time   NA 136 07/04/2014 1401   NA 137 01/29/2012 1513   K 4.1 07/04/2014 1401   K 4.5 01/29/2012 1513   CL 103 11/25/2012 1328   CL 102 01/29/2012 1513   CO2 27 07/04/2014 1401   CO2 29 01/29/2012 1513   GLUCOSE 99 07/04/2014 1401   GLUCOSE 91 11/25/2012 1328   GLUCOSE 81 01/29/2012 1513   BUN 19.1 07/04/2014 1401   BUN 20 01/29/2012 1513   CREATININE 0.8 07/04/2014 1401   CREATININE 0.76 01/29/2012 1513   CALCIUM 8.9 07/04/2014 1401   CALCIUM 9.0 01/29/2012 1513   PROT 7.1 07/04/2014 1401   PROT 6.1 01/29/2012 1513   ALBUMIN 3.6 07/04/2014 1401   ALBUMIN 3.5 01/29/2012 1513   AST 24 07/04/2014 1401   AST 25 01/29/2012 1513   ALT 17 07/04/2014 1401   ALT 16 01/29/2012 1513   ALKPHOS 66 07/04/2014 1401   ALKPHOS 68 01/29/2012 1513   BILITOT 0.40 07/04/2014 1401   BILITOT 0.3 01/29/2012 1513    No results found for: SPEP, UPEP  Lab Results  Component Value Date   WBC 4.0 07/04/2014   NEUTROABS 2.7 07/04/2014   HGB 11.4* 07/04/2014   HCT 35.6 07/04/2014   MCV 101.2* 07/04/2014   PLT 218 07/04/2014      Chemistry      Component Value Date/Time   NA 136 07/04/2014 1401   NA 137 01/29/2012 1513   K 4.1 07/04/2014 1401   K 4.5 01/29/2012 1513   CL 103 11/25/2012 1328   CL 102 01/29/2012 1513   CO2 27 07/04/2014 1401   CO2 29 01/29/2012 1513   BUN 19.1 07/04/2014 1401   BUN 20 01/29/2012 1513   CREATININE 0.8 07/04/2014 1401   CREATININE 0.76 01/29/2012 1513      Component Value Date/Time   CALCIUM 8.9 07/04/2014 1401   CALCIUM  9.0 01/29/2012 1513   ALKPHOS 66 07/04/2014 1401   ALKPHOS 68 01/29/2012 1513   AST 24 07/04/2014 1401   AST 25 01/29/2012 1513   ALT 17 07/04/2014 1401   ALT 16 01/29/2012 1513   BILITOT 0.40 07/04/2014 1401   BILITOT 0.3 01/29/2012 1513      ASSESSMENT & PLAN:  Multiple myeloma The decision was made based on recent publication on NEJM. Role of treatment is palliative.  Elotuzumab Therapy for Relapsed or Refractory Multiple Myeloma Almyra Free, M.D., Meletios Dimopoulos, M.D., Lucienne Capers, M.D., Cephus Shelling, M.D., Laurin Coder, M.D., Ph.D., Mickel Crow, M.D., Rene Kocher, M.D., Stacie Acres, M.D., Maria-Victoria Annett Fabian, M.D., Ph.D., Hila Saxer Bower, M.D., Jodelle Green, M.D., Arvil Persons, M.D., Rolanda Jay, M.D., Judie Bonus, M.D., Anselmo Rod, M.D., Gasper Sells. Marisa Hua, M.D., Bertis Ruddy, M.D., Signa Kell, M.D., Katrina Stack, M.D., Martin Majestic  Everlean Patterson, M.D., Tawanna Sat, Ph.D., Milagros Evener, M.D., Gerrie Nordmann, M.D., Alain Marion, M.D., Ph.D., Cyd Silence, M.D., Clearnce Hasten, M.[place]., Nadean Corwin. Ouida Sills, M.D., and Renaldo Harrison, M.D. for the [facility] Investigators N Engl J Med [phone] 13, 2015DOI: 10.1056/[id number]  Elotuzumab, an immunostimulatory monoclonal antibody targeting signaling lymphocytic activation molecule F7 (SLAMF7), showed activity in combination with lenalidomide and dexamethasone.  After a median follow-up of 24.5 months, the rate of progression-free survival at 1 year in the elotuzumab group was 68%. Median progression-free survival in the elotuzumab group was 19.4 months. The overall response rate in the elotuzumab group was 79%. Common grade 3 or 4 adverse events in the two groups were lymphocytopenia, neutropenia, fatigue, and pneumonia. Infusion reactions occurred in 33 patients (10%) in the elotuzumab group and were grade 1 or 2 in 29 patients. Some of the short term side-effects included, though  not limited to, risk of fatigue, weight loss, tumor lysis syndrome, risk of allergic reactions, pancytopenia, life-threatening infections, need for transfusions of blood products, admission to hospital for various reasons, and risks of death.   The patient is aware that the response rates discussed earlier is not guaranteed.    After a long discussion, patient made an informed decision to proceed with the prescribed plan of care.  The patient will need dental evaluation before prescription Zometa.    Anemia in neoplastic disease This is likely anemia of chronic disease. The patient denies recent history of bleeding such as epistaxis, hematuria or hematochezia. She is asymptomatic from the anemia. We will observe for now.  She does not require transfusion now.     No orders of the defined types were placed in this encounter.   All questions were answered. The patient knows to call the clinic with any problems, questions or concerns. No barriers to learning was detected. I spent 30 minutes counseling the patient face to face. The total time spent in the appointment was 40 minutes and more than 50% was on counseling and review of test results     [facility], [place], MD 07/05/2014 12:35 PM

## 2014-07-09 ENCOUNTER — Telehealth: Payer: Self-pay | Admitting: *Deleted

## 2014-07-09 ENCOUNTER — Ambulatory Visit: Payer: Medicare Other | Admitting: Hematology and Oncology

## 2014-07-09 LAB — SPEP & IFE WITH QIG
Albumin ELP: 56.2 % (ref 55.8–66.1)
Alpha-1-Globulin: 3.6 % (ref 2.9–4.9)
Alpha-2-Globulin: 10.1 % (ref 7.1–11.8)
BETA GLOBULIN: 4.8 % (ref 4.7–7.2)
Beta 2: 2.7 % — ABNORMAL LOW (ref 3.2–6.5)
GAMMA GLOBULIN: 22.6 % — AB (ref 11.1–18.8)
IgA: 41 mg/dL — ABNORMAL LOW (ref 69–380)
IgG (Immunoglobin G), Serum: 1680 mg/dL (ref 690–1700)
IgM, Serum: 73 mg/dL (ref 52–322)
M-Spike, %: 1.26 g/dL
Total Protein, Serum Electrophoresis: 7 g/dL (ref 6.0–8.3)

## 2014-07-09 LAB — KAPPA/LAMBDA LIGHT CHAINS
Kappa free light chain: 12.8 mg/dL — ABNORMAL HIGH (ref 0.33–1.94)
Kappa:Lambda Ratio: 32.82 — ABNORMAL HIGH (ref 0.26–1.65)
Lambda Free Lght Chn: 0.39 mg/dL — ABNORMAL LOW (ref 0.57–2.63)

## 2014-07-09 NOTE — Telephone Encounter (Signed)
Have called patient three times throughout the course of the day.  Phone line busy with no answering machine.  Will rescheduled Follow up call

## 2014-07-10 ENCOUNTER — Telehealth: Payer: Self-pay | Admitting: *Deleted

## 2014-07-10 NOTE — Telephone Encounter (Signed)
07/04/14 PT. HAS HAD AN INTERMITTED SORE THROAT, RUNNY NOSE, AND A DRY COUGH. NO FEVER. DRAINAGE FROM HER NOSE IS CLEAR. PT. IS USING SALT WATER GARGLES AND IS FEELING BETTER TODAY. HOWEVER SHE IS FATIGUED. PT. IS EATING BUT HER FLUID INTAKE FOR THE PAST 24 HOURS IS ONLY 48 OUNCES. ENCOURAGED PT. TO FORCE FLUIDS AND OFFERED SUGGESTIONS. NO NAUSEA OR VOMITING. DIARRHEA X2 BUT NONE SINCE YESTERDAY. SHE HAS HAD A GOOD BOWEL MOVEMENT WITHIN THE LAST THREE DAYS. NO MOUTH ISSUES. INSTRUCTED PT. TO CALL IF SHE DEVELOPS A FEVER OF 100.5 OR DRAINAGE BECOMES YELLOW OR GREEN IN COLOR. PT. VOICES UNDERSTANDING. INFORMED PT. THAT A PHYSICIAN IS ON CALL 24/7 IF THE NEED ARISES.

## 2014-07-12 ENCOUNTER — Other Ambulatory Visit: Payer: Medicare Other

## 2014-07-12 ENCOUNTER — Other Ambulatory Visit: Payer: Self-pay | Admitting: Hematology and Oncology

## 2014-07-12 ENCOUNTER — Ambulatory Visit: Payer: Medicare Other

## 2014-07-12 ENCOUNTER — Ambulatory Visit (HOSPITAL_BASED_OUTPATIENT_CLINIC_OR_DEPARTMENT_OTHER): Payer: Medicare Other

## 2014-07-12 DIAGNOSIS — Z5112 Encounter for antineoplastic immunotherapy: Secondary | ICD-10-CM

## 2014-07-12 DIAGNOSIS — C9000 Multiple myeloma not having achieved remission: Secondary | ICD-10-CM

## 2014-07-12 MED ORDER — ACETAMINOPHEN 325 MG PO TABS
ORAL_TABLET | ORAL | Status: AC
  Filled 2014-07-12: qty 2

## 2014-07-12 MED ORDER — SODIUM CHLORIDE 0.9 % IV SOLN
Freq: Once | Status: AC
  Administered 2014-07-12: 11:00:00 via INTRAVENOUS
  Filled 2014-07-12: qty 230

## 2014-07-12 MED ORDER — SODIUM CHLORIDE 0.9 % IJ SOLN
10.0000 mL | INTRAMUSCULAR | Status: DC | PRN
  Administered 2014-07-12: 10 mL
  Filled 2014-07-12: qty 10

## 2014-07-12 MED ORDER — HEPARIN SOD (PORK) LOCK FLUSH 100 UNIT/ML IV SOLN
500.0000 [IU] | Freq: Once | INTRAVENOUS | Status: AC | PRN
  Administered 2014-07-12: 500 [IU]
  Filled 2014-07-12: qty 5

## 2014-07-12 MED ORDER — FAMOTIDINE IN NACL 20-0.9 MG/50ML-% IV SOLN
20.0000 mg | Freq: Once | INTRAVENOUS | Status: AC
  Administered 2014-07-12: 20 mg via INTRAVENOUS

## 2014-07-12 MED ORDER — FAMOTIDINE IN NACL 20-0.9 MG/50ML-% IV SOLN
INTRAVENOUS | Status: AC
  Filled 2014-07-12: qty 50

## 2014-07-12 MED ORDER — DEXAMETHASONE SODIUM PHOSPHATE 10 MG/ML IJ SOLN
10.0000 mg | Freq: Once | INTRAMUSCULAR | Status: AC
  Administered 2014-07-12: 10 mg via INTRAVENOUS

## 2014-07-12 MED ORDER — ACETAMINOPHEN 325 MG PO TABS
650.0000 mg | ORAL_TABLET | Freq: Once | ORAL | Status: AC
  Administered 2014-07-12: 650 mg via ORAL

## 2014-07-12 MED ORDER — DEXAMETHASONE SODIUM PHOSPHATE 10 MG/ML IJ SOLN
INTRAMUSCULAR | Status: AC
  Filled 2014-07-12: qty 1

## 2014-07-12 MED ORDER — DIPHENHYDRAMINE HCL 25 MG PO CAPS
50.0000 mg | ORAL_CAPSULE | Freq: Once | ORAL | Status: AC
  Administered 2014-07-12: 50 mg via ORAL

## 2014-07-12 MED ORDER — DIPHENHYDRAMINE HCL 25 MG PO CAPS
ORAL_CAPSULE | ORAL | Status: AC
  Filled 2014-07-12: qty 2

## 2014-07-12 MED ORDER — SODIUM CHLORIDE 0.9 % IV SOLN
INTRAVENOUS | Status: DC
Start: 2014-07-12 — End: 2014-07-12
  Administered 2014-07-12: 10:00:00 via INTRAVENOUS

## 2014-07-12 NOTE — Progress Notes (Signed)
Elotuzamab started at 52ml/mn or 60 ml/hr x 43ml.

## 2014-07-12 NOTE — Progress Notes (Signed)
Discharged at 1430 with daughter in no distress.

## 2014-07-12 NOTE — Patient Instructions (Addendum)
[facility] Discharge Instructions for Patients Receiving Chemotherapy  Today you received the following chemotherapy agents Elotuzumab.  To help prevent nausea and vomiting after your treatment, we encourage you to take your nausea medication as ordered.   If you develop nausea and vomiting that is not controlled by your nausea medication, call the clinic.   BELOW ARE SYMPTOMS THAT SHOULD BE REPORTED IMMEDIATELY:  *FEVER GREATER THAN 100.5 F  *CHILLS WITH OR WITHOUT FEVER  NAUSEA AND VOMITING THAT IS NOT CONTROLLED WITH YOUR NAUSEA MEDICATION  *UNUSUAL SHORTNESS OF BREATH  *UNUSUAL BRUISING OR BLEEDING  TENDERNESS IN MOUTH AND THROAT WITH OR WITHOUT PRESENCE OF ULCERS  *URINARY PROBLEMS  *BOWEL PROBLEMS  UNUSUAL RASH Items with * indicate a potential emergency and should be followed up as soon as possible.  Feel free to call the clinic you have any questions or concerns. The clinic phone number is [phone].

## 2014-07-12 NOTE — Progress Notes (Signed)
Elotuxumab rate increased.  Tolerating well.

## 2014-07-17 ENCOUNTER — Other Ambulatory Visit: Payer: Self-pay | Admitting: Cardiovascular Disease

## 2014-07-18 ENCOUNTER — Other Ambulatory Visit: Payer: Self-pay | Admitting: Hematology and Oncology

## 2014-07-18 ENCOUNTER — Telehealth: Payer: Self-pay | Admitting: Hematology and Oncology

## 2014-07-18 ENCOUNTER — Telehealth: Payer: Self-pay | Admitting: *Deleted

## 2014-07-18 ENCOUNTER — Ambulatory Visit (HOSPITAL_BASED_OUTPATIENT_CLINIC_OR_DEPARTMENT_OTHER): Payer: Medicare Other

## 2014-07-18 ENCOUNTER — Ambulatory Visit (HOSPITAL_COMMUNITY)
Admission: RE | Admit: 2014-07-18 | Discharge: 2014-07-18 | Disposition: A | Discharge status: Home / Self Care | Payer: Medicare Other | Source: Ambulatory Visit | Attending: Hematology and Oncology | Admitting: Hematology and Oncology

## 2014-07-18 ENCOUNTER — Ambulatory Visit (HOSPITAL_BASED_OUTPATIENT_CLINIC_OR_DEPARTMENT_OTHER): Payer: Medicare Other | Admitting: Hematology and Oncology

## 2014-07-18 ENCOUNTER — Ambulatory Visit: Payer: Medicare Other

## 2014-07-18 VITALS — BP 119/43 | HR 57 | Temp 98.4°F | Resp 18 | Ht 65.0 in | Wt 127.5 lb

## 2014-07-18 DIAGNOSIS — M4854XA Collapsed vertebra, not elsewhere classified, thoracic region, initial encounter for fracture: Secondary | ICD-10-CM | POA: Diagnosis not present

## 2014-07-18 DIAGNOSIS — W19XXXA Unspecified fall, initial encounter: Secondary | ICD-10-CM | POA: Insufficient documentation

## 2014-07-18 DIAGNOSIS — S22000A Wedge compression fracture of unspecified thoracic vertebra, initial encounter for closed fracture: Secondary | ICD-10-CM

## 2014-07-18 DIAGNOSIS — IMO0001 Reserved for inherently not codable concepts without codable children: Secondary | ICD-10-CM

## 2014-07-18 DIAGNOSIS — M4850XA Collapsed vertebra, not elsewhere classified, site unspecified, initial encounter for fracture: Secondary | ICD-10-CM

## 2014-07-18 DIAGNOSIS — Z95828 Presence of other vascular implants and grafts: Secondary | ICD-10-CM

## 2014-07-18 DIAGNOSIS — C9000 Multiple myeloma not having achieved remission: Secondary | ICD-10-CM

## 2014-07-18 DIAGNOSIS — M5032 Other cervical disc degeneration, mid-cervical region: Secondary | ICD-10-CM | POA: Diagnosis not present

## 2014-07-18 DIAGNOSIS — D630 Anemia in neoplastic disease: Secondary | ICD-10-CM

## 2014-07-18 DIAGNOSIS — M549 Dorsalgia, unspecified: Secondary | ICD-10-CM | POA: Diagnosis present

## 2014-07-18 LAB — COMPREHENSIVE METABOLIC PANEL (CC13)
ALK PHOS: 63 U/L (ref 40–150)
ALT: 12 U/L (ref 0–55)
AST: 17 U/L (ref 5–34)
Albumin: 3.1 g/dL — ABNORMAL LOW (ref 3.5–5.0)
Anion Gap: 7 mEq/L (ref 3–11)
BILIRUBIN TOTAL: 0.67 mg/dL (ref 0.20–1.20)
BUN: 14.9 mg/dL (ref 7.0–26.0)
CO2: 24 mEq/L (ref 22–29)
CREATININE: 0.7 mg/dL (ref 0.6–1.1)
Calcium: 8.8 mg/dL (ref 8.4–10.4)
Chloride: 101 mEq/L (ref 98–109)
EGFR: 74 mL/min/{1.73_m2} — ABNORMAL LOW (ref >90)
GLUCOSE: 97 mg/dL (ref 70–140)
Potassium: 4.2 mEq/L (ref 3.5–5.1)
Sodium: 132 mEq/L — ABNORMAL LOW (ref 136–145)
Total Protein: 6.7 g/dL (ref 6.4–8.3)

## 2014-07-18 LAB — CBC WITH DIFFERENTIAL/PLATELET
BASO%: 0.3 % (ref 0.0–2.0)
BASOS ABS: 0 10*3/uL (ref 0.0–0.1)
EOS%: 0.8 % (ref 0.0–7.0)
Eosinophils Absolute: 0.1 10*3/uL (ref 0.0–0.5)
HEMATOCRIT: 31.9 % — AB (ref 34.8–46.6)
HEMOGLOBIN: 10.8 g/dL — AB (ref 11.6–15.9)
LYMPH#: 0.8 10*3/uL — AB (ref 0.9–3.3)
LYMPH%: 12.2 % — AB (ref 14.0–49.7)
MCH: 32.9 pg (ref 25.1–34.0)
MCHC: 33.9 g/dL (ref 31.5–36.0)
MCV: 97.3 fL (ref 79.5–101.0)
MONO#: 0.4 10*3/uL (ref 0.1–0.9)
MONO%: 6.8 % (ref 0.0–14.0)
NEUT#: 4.9 10*3/uL (ref 1.5–6.5)
NEUT%: 79.9 % — AB (ref 38.4–76.8)
PLATELETS: 207 10*3/uL (ref 145–400)
RBC: 3.28 10*6/uL — ABNORMAL LOW (ref 3.70–5.45)
RDW: 13.9 % (ref 11.2–14.5)
WBC: 6.2 10*3/uL (ref 3.9–10.3)

## 2014-07-18 LAB — LACTATE DEHYDROGENASE (CC13): LDH: 162 U/L (ref 125–245)

## 2014-07-18 MED ORDER — HEPARIN SOD (PORK) LOCK FLUSH 100 UNIT/ML IV SOLN
500.0000 [IU] | Freq: Once | INTRAVENOUS | Status: AC
  Administered 2014-07-18: 500 [IU] via INTRAVENOUS
  Filled 2014-07-18: qty 5

## 2014-07-18 MED ORDER — SODIUM CHLORIDE 0.9 % IJ SOLN
10.0000 mL | INTRAMUSCULAR | Status: DC | PRN
  Administered 2014-07-18: 10 mL via INTRAVENOUS
  Filled 2014-07-18: qty 10

## 2014-07-18 MED ORDER — HYDROMORPHONE HCL 4 MG PO TABS: 4.0000 mg | ORAL_TABLET | ORAL | Status: DC | PRN

## 2014-07-18 NOTE — Patient Instructions (Signed)

## 2014-07-18 NOTE — Telephone Encounter (Signed)
Pt states she hurt her back trying to lift a corner of a mattress yesterday.  She felt something pop in her back and now she is in so much pain she can hardly walk.  She doesn't think she will be able to make it to her appts here tomorrow.  Tramadol is causing her gas pains.  She asks if ok to take one of her husband's hydrocodones?  Pt was crying on the phone.  Dr. Alvy Bimler ordered xrays today and will see pt after xrays and lab.  Instructed pt to come in to [facility] at 10:15 am for xrays, then come to [facility] for lab and see Dr. Alvy Bimler.  She verbalized understanding.

## 2014-07-18 NOTE — Telephone Encounter (Signed)
Gave asv & cal for Jan. °

## 2014-07-19 ENCOUNTER — Other Ambulatory Visit: Payer: Self-pay | Admitting: Hematology and Oncology

## 2014-07-19 ENCOUNTER — Encounter: Payer: Self-pay | Admitting: Hematology and Oncology

## 2014-07-19 ENCOUNTER — Telehealth: Payer: Self-pay | Admitting: *Deleted

## 2014-07-19 ENCOUNTER — Ambulatory Visit: Payer: Medicare Other | Admitting: Hematology and Oncology

## 2014-07-19 ENCOUNTER — Other Ambulatory Visit: Payer: Self-pay | Admitting: *Deleted

## 2014-07-19 ENCOUNTER — Encounter: Payer: Self-pay | Admitting: *Deleted

## 2014-07-19 ENCOUNTER — Other Ambulatory Visit: Payer: Medicare Other

## 2014-07-19 ENCOUNTER — Ambulatory Visit: Payer: Medicare Other

## 2014-07-19 ENCOUNTER — Ambulatory Visit (HOSPITAL_BASED_OUTPATIENT_CLINIC_OR_DEPARTMENT_OTHER): Payer: Medicare Other

## 2014-07-19 DIAGNOSIS — Z5112 Encounter for antineoplastic immunotherapy: Secondary | ICD-10-CM

## 2014-07-19 DIAGNOSIS — C9000 Multiple myeloma not having achieved remission: Secondary | ICD-10-CM

## 2014-07-19 DIAGNOSIS — M4850XA Collapsed vertebra, not elsewhere classified, site unspecified, initial encounter for fracture: Secondary | ICD-10-CM | POA: Insufficient documentation

## 2014-07-19 MED ORDER — LENALIDOMIDE 10 MG PO CAPS: 10.0000 mg | ORAL_CAPSULE | Freq: Every day | ORAL | Status: DC

## 2014-07-19 MED ORDER — DIPHENHYDRAMINE HCL 25 MG PO CAPS
50.0000 mg | ORAL_CAPSULE | Freq: Once | ORAL | Status: AC
  Administered 2014-07-19: 50 mg via ORAL

## 2014-07-19 MED ORDER — DEXAMETHASONE SODIUM PHOSPHATE 10 MG/ML IJ SOLN
INTRAMUSCULAR | Status: AC
  Filled 2014-07-19: qty 1

## 2014-07-19 MED ORDER — FAMOTIDINE IN NACL 20-0.9 MG/50ML-% IV SOLN
20.0000 mg | Freq: Once | INTRAVENOUS | Status: AC
  Administered 2014-07-19: 20 mg via INTRAVENOUS

## 2014-07-19 MED ORDER — SODIUM CHLORIDE 0.9 % IV SOLN
Freq: Once | INTRAVENOUS | Status: AC
  Administered 2014-07-19: 14:00:00 via INTRAVENOUS
  Filled 2014-07-19: qty 230

## 2014-07-19 MED ORDER — HEPARIN SOD (PORK) LOCK FLUSH 100 UNIT/ML IV SOLN
500.0000 [IU] | Freq: Once | INTRAVENOUS | Status: AC | PRN
  Administered 2014-07-19: 500 [IU]
  Filled 2014-07-19: qty 5

## 2014-07-19 MED ORDER — DIPHENHYDRAMINE HCL 25 MG PO CAPS
ORAL_CAPSULE | ORAL | Status: AC
  Filled 2014-07-19: qty 2

## 2014-07-19 MED ORDER — SODIUM CHLORIDE 0.9 % IV SOLN
INTRAVENOUS | Status: DC
  Administered 2014-07-19: 12:00:00 via INTRAVENOUS

## 2014-07-19 MED ORDER — DEXAMETHASONE SODIUM PHOSPHATE 10 MG/ML IJ SOLN
10.0000 mg | Freq: Once | INTRAMUSCULAR | Status: AC
  Administered 2014-07-19: 10 mg via INTRAVENOUS

## 2014-07-19 MED ORDER — ZOLEDRONIC ACID 4 MG/5ML IV CONC
3.3000 mg | Freq: Once | INTRAVENOUS | Status: AC
  Administered 2014-07-19: 3.3 mg via INTRAVENOUS
  Filled 2014-07-19: qty 4.13

## 2014-07-19 MED ORDER — FAMOTIDINE IN NACL 20-0.9 MG/50ML-% IV SOLN
INTRAVENOUS | Status: AC
  Filled 2014-07-19: qty 50

## 2014-07-19 MED ORDER — SODIUM CHLORIDE 0.9 % IJ SOLN
10.0000 mL | INTRAMUSCULAR | Status: DC | PRN
  Administered 2014-07-19: 10 mL
  Filled 2014-07-19: qty 10

## 2014-07-19 MED ORDER — ACETAMINOPHEN 325 MG PO TABS
ORAL_TABLET | ORAL | Status: AC
  Filled 2014-07-19: qty 2

## 2014-07-19 MED ORDER — ACETAMINOPHEN 325 MG PO TABS
650.0000 mg | ORAL_TABLET | Freq: Once | ORAL | Status: AC
  Administered 2014-07-19: 650 mg via ORAL

## 2014-07-19 NOTE — Assessment & Plan Note (Signed)
Overall, she is tolerating treatment well. I will recheck her blood work in a few weeks to assess response to treatment. Unfortunately, her compression fractures related to her disease.

## 2014-07-19 NOTE — Progress Notes (Signed)
[facility] OFFICE PROGRESS NOTE  Patient Care Team: Josue Hector, MD as PCP - General (Cardiology) Thressa Sheller, MD (Internal Medicine) Renella Cunas, MD as Attending Physician (Cardiology) Heath Lark, MD as Consulting Physician (Hematology and Oncology)  SUMMARY OF ONCOLOGIC HISTORY: Oncology History   The     Multiple myeloma   11/12/2008 Imaging Skeletal survey showed lytic lesion in the right femur compatible with  myeloma. There were Questionable skull lesions   11/15/2008 Bone Marrow Biopsy BONE MARROW ASPIRATE AND BIOPSY: showed NORMOCELLULAR MARROW FOR AGE WITH PLASMA CELL DYSCRASIA  (PLASMA CELLS 25%). 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 Subsequently she was placed on Revlimid & Dexamethasone alone   05/12/2013 Bone Marrow Biopsy Bone marrow biopsy showed persistent plasma cells. Blood work confirmed VGPR status   06/11/2014 Tumor Marker Bloodwork show disease relapse   06/26/2014 Procedure She has placement of port   07/05/2014 -  Chemotherapy She received Elotuzumab and Revlimid    INTERVAL HISTORY: Please see below for problem oriented charting. The patient is seen urgently because of severe pain. She was making her bed at home when she hear her back "popped" Since then, she has severe, uncontrollable pain. She denies any neurological deficits. She tolerated chemotherapy well. Denies any mucositis nausea or vomiting.  REVIEW OF SYSTEMS:   Constitutional: Denies fevers, chills or abnormal weight loss Eyes: Denies blurriness of vision Ears, nose, mouth, throat, and face: Denies mucositis or sore throat Respiratory: Denies cough, dyspnea or wheezes Cardiovascular: Denies palpitation, chest discomfort or lower extremity swelling Gastrointestinal:  Denies nausea, heartburn or change in bowel  habits Skin: Denies abnormal skin rashes Lymphatics: Denies new lymphadenopathy or easy bruising Neurological:Denies numbness, tingling or new weaknesses Behavioral/Psych: Mood is stable, no new changes  All other systems were reviewed with the patient and are negative.  I have reviewed the past medical history, past surgical history, social history and family history with the patient and they are unchanged from previous note.  ALLERGIES:  has No Known Allergies.  MEDICATIONS:  Current Outpatient Prescriptions  Medication Sig Dispense Refill  . acetaminophen (TYLENOL) 325 MG tablet Take 650 mg by mouth every 6 (six) hours as needed.    [name] aspirin 325 MG EC tablet Take 325 mg by mouth daily.    . cholecalciferol (VITAMIN D) 1000 UNITS tablet Take 1,000 Units by mouth daily.    . diphenhydramine-acetaminophen (TYLENOL PM) 25-500 MG TABS Take 1 tablet by mouth at bedtime as needed.    . gabapentin (NEURONTIN) 300 MG capsule Take 1 capsule (300 mg total) by mouth 3 (three) times daily. (Patient taking differently: Take 300 mg by mouth at bedtime. ) 90 capsule 3  . levothyroxine (SYNTHROID, LEVOTHROID) 50 MCG tablet Take 50 mcg by mouth daily.    [name] lidocaine-prilocaine (EMLA) cream Apply 1 application topically as needed. Apply to [facility] a cath site at least one hour prior to [employer]. 30 g 2  . loperamide (IMODIUM A-D) 2 MG tablet Take 2 mg by mouth as needed for diarrhea or loose stools (as directed.).    [name] meloxicam (MOBIC) 7.5 MG tablet Take 7.5 mg by mouth 2 (two) times daily as needed for pain.     . traMADol (ULTRAM) 50 MG tablet Take 1 tablet (50 mg total) by mouth every 6 (six) hours as needed for severe pain. 60 tablet 0  .  valsartan (DIOVAN) 160 MG tablet Take 160 mg by mouth at bedtime.    . valsartan (DIOVAN) 160 MG tablet TAKE ONE TABLET BY MOUTH ONE TIME DAILY  30 tablet 0  . HYDROmorphone (DILAUDID) 4 MG tablet Take 1 tablet (4 mg total) by mouth every 4 (four) hours as needed  for severe pain. 60 tablet 0  . lenalidomide (REVLIMID) 10 MG capsule Take 1 capsule (10 mg total) by mouth daily. 21 capsule 0   No current facility-administered medications for this visit.   Facility-Administered Medications Ordered in Other Visits  Medication Dose Route Frequency Provider Last Rate Last Dose  . 0.9 %  sodium chloride infusion   Intravenous Continuous Heath Lark, MD 10 mL/hr at 07/19/14 1200    . ELOTUZUMAB (EMPLICITI) 213 mg in sodium chloride 0.9% 230 mL chemo infusion    Intravenous Once Heath Lark, MD      . heparin lock flush 100 unit/mL  500 Units Intracatheter Once PRN Heath Lark, MD      . sodium chloride 0.9 % injection 10 mL  10 mL Intracatheter PRN Heath Lark, MD      . zolendronic acid (ZOMETA) 3.3 mg in sodium chloride 0.9 % 100 mL IVPB  3.3 mg Intravenous Once Heath Lark, MD 416.5 mL/hr at 07/19/14 1314 3.3 mg at 07/19/14 1314    PHYSICAL EXAMINATION: ECOG PERFORMANCE STATUS: 2 - Symptomatic, <50% confined to bed  Filed Vitals:   07/18/14 1154  BP: 119/43  Pulse: 57  Temp: 98.4 F (36.9 C)  Resp: 18   Filed Weights   07/18/14 1154  Weight: 127 lb 8 oz (57.834 kg)    GENERAL:alert, no distress and comfortable. She appears uncomfortable SKIN: skin color, texture, turgor are normal, no rashes or significant lesions EYES: normal, Conjunctiva are pink and non-injected, sclera clear OROPHARYNX:no exudate, no erythema and lips, buccal mucosa, and tongue normal  NECK: supple, thyroid normal size, non-tender, without nodularity LYMPH:  no palpable lymphadenopathy in the cervical, axillary or inguinal LUNGS: clear to auscultation and percussion with normal breathing effort HEART: regular rate & rhythm and no murmurs and no lower extremity edema ABDOMEN:abdomen soft, non-tender and normal bowel sounds Musculoskeletal:no cyanosis of digits and no clubbing  NEURO: alert & oriented x 3 with fluent speech, no focal motor/sensory deficits  LABORATORY DATA:   I have reviewed the data as listed    Component Value Date/Time   NA 132* 07/18/2014 1112   NA 137 01/29/2012 1513   K 4.2 07/18/2014 1112   K 4.5 01/29/2012 1513   CL 103 11/25/2012 1328   CL 102 01/29/2012 1513   CO2 24 07/18/2014 1112   CO2 29 01/29/2012 1513   GLUCOSE 97 07/18/2014 1112   GLUCOSE 91 11/25/2012 1328   GLUCOSE 81 01/29/2012 1513   BUN 14.9 07/18/2014 1112   BUN 20 01/29/2012 1513   CREATININE 0.7 07/18/2014 1112   CREATININE 0.76 01/29/2012 1513   CALCIUM 8.8 07/18/2014 1112   CALCIUM 9.0 01/29/2012 1513   PROT 6.7 07/18/2014 1112   PROT 6.1 01/29/2012 1513   ALBUMIN 3.1* 07/18/2014 1112   ALBUMIN 3.5 01/29/2012 1513   AST 17 07/18/2014 1112   AST 25 01/29/2012 1513   ALT 12 07/18/2014 1112   ALT 16 01/29/2012 1513   ALKPHOS 63 07/18/2014 1112   ALKPHOS 68 01/29/2012 1513   BILITOT 0.67 07/18/2014 1112   BILITOT 0.3 01/29/2012 1513    No results found for: SPEP, UPEP  Lab Results  Component Value Date   WBC 6.2 07/18/2014   NEUTROABS 4.9 07/18/2014   HGB 10.8* 07/18/2014   HCT 31.9* 07/18/2014   MCV 97.3 07/18/2014   PLT 207 07/18/2014      Chemistry      Component Value Date/Time   NA 132* 07/18/2014 1112   NA 137 01/29/2012 1513   K 4.2 07/18/2014 1112   K 4.5 01/29/2012 1513   CL 103 11/25/2012 1328   CL 102 01/29/2012 1513   CO2 24 07/18/2014 1112   CO2 29 01/29/2012 1513   BUN 14.9 07/18/2014 1112   BUN 20 01/29/2012 1513   CREATININE 0.7 07/18/2014 1112   CREATININE 0.76 01/29/2012 1513      Component Value Date/Time   CALCIUM 8.8 07/18/2014 1112   CALCIUM 9.0 01/29/2012 1513   ALKPHOS 63 07/18/2014 1112   ALKPHOS 68 01/29/2012 1513   AST 17 07/18/2014 1112   AST 25 01/29/2012 1513   ALT 12 07/18/2014 1112   ALT 16 01/29/2012 1513   BILITOT 0.67 07/18/2014 1112   BILITOT 0.3 01/29/2012 1513       RADIOGRAPHIC STUDIES: I have personally reviewed the radiological images as listed and agreed with the findings in the  report. Dg Cervical Spine Complete  07/18/2014   CLINICAL DATA:  Low back pain, left neck pain after lifting mattress yesterday. History of multiple myeloma.  EXAM: CERVICAL SPINE  4+ VIEWS  COMPARISON:  None.  FINDINGS: Degenerative disc disease changes from C4-5 thru C6-7. Degenerative facet disease, left greater than right with slight anterolisthesis of C4 on C5. No fracture. Mild bilateral neural foraminal narrowing at C5-6 due to uncovertebral spurring and facet disease. Prevertebral soft tissues are normal.  IMPRESSION: Degenerative disc and facet disease as above. No acute bony abnormality.   Electronically Signed   By: Kevin  Dover M.D.   On: 07/18/2014 11:41   Dg Thoracic Spine 2 View  07/18/2014   CLINICAL DATA:  Severe back pain. Fall yesterday. History of multiple myeloma.  EXAM: THORACIC SPINE - 2 VIEW  COMPARISON:  Bone survey 02/05/2014  FINDINGS: Right jugular Port-A-Cath is noted. Mild thoracic levoscoliosis is noted. There is a new, mild T12 superior endplate compression fracture. There is no listhesis although the cervicothoracic junction is partially obscured by overlapping shoulder tissues. Mild multilevel thoracic endplate osteophytosis is noted. No lytic or blastic osseous lesion is identified. Thoracic aortic calcification is noted. Right upper quadrant abdominal surgical clips.  IMPRESSION: New, mild T12 compression fracture.   Electronically Signed   By: Allen  Grady   On: 07/18/2014 11:30   Dg Lumbar Spine Complete  07/18/2014   CLINICAL DATA:  Low back pain since an injury 07/17/2014 after lifting injury. History of multiple myeloma.  EXAM: LUMBAR SPINE - COMPLETE 4+ VIEW  COMPARISON:  Osseous survey 04/19/2013.  FINDINGS: No fracture is identified. Trace anterolisthesis L4 on L5 is noted. Intervertebral disc space height is maintained. Aortic atherosclerosis is noted.  IMPRESSION: Negative for fracture. No acute finding. Stable compared to prior exam.   Electronically Signed    By: Thomas  Dalessio M.D.   On: 07/18/2014 11:37     ASSESSMENT & PLAN:  Multiple myeloma Overall, she is tolerating treatment well. I will recheck her blood work in a few weeks to assess response to treatment. Unfortunately, her compression fractures related to her disease.   Anemia in neoplastic disease This is likely anemia of chronic disease. The patient denies recent history of bleeding such as epistaxis,   hematuria or hematochezia. She is asymptomatic from the anemia. We will observe for now.  She does not require transfusion now.    Vertebral compression fracture This is new on her x-ray. I will control her with pain medication right now. We discussed narcotic refill policy and side effects to be expected. I will consult interventional radiologist for kyphoplasty. She will continue Zometa to keep her bones strong. The patient is on calcium and vitamin D.    Orders Placed This Encounter  Procedures  . DG Radiologist Eval And Mgmt    Standing Status: Future     Number of Occurrences:      Standing Expiration Date: 09/17/2015    Order Specific Question:  Reason for Exam (SYMPTOM  OR DIAGNOSIS REQUIRED)    Answer:  new compression fracture from myeloma, for kyphoplasty    Order Specific Question:  Preferred imaging location?    Answer:  [facility]  . SPEP & IFE with QIG    Standing Status: Future     Number of Occurrences:      Standing Expiration Date: 08/22/2015  . Kappa/lambda light chains    Standing Status: Future     Number of Occurrences:      Standing Expiration Date: 08/22/2015  . Beta 2 microglobulin, serum    Standing Status: Future     Number of Occurrences:      Standing Expiration Date: 08/22/2015  . CBC with Differential    Standing Status: Standing     Number of Occurrences: 9     Standing Expiration Date: 07/19/2015  . Comprehensive metabolic panel    Standing Status: Standing     Number of Occurrences: 9     Standing Expiration Date:  07/19/2015   All questions were answered. The patient knows to call the clinic with any problems, questions or concerns. No barriers to learning was detected. I spent 40 minutes counseling the patient face to face. The total time spent in the appointment was 55 minutes and more than 50% was on counseling and review of test results     , , MD 07/19/2014 @ 1:16 PM    

## 2014-07-19 NOTE — Assessment & Plan Note (Signed)
This is likely anemia of chronic disease. The patient denies recent history of bleeding such as epistaxis, hematuria or hematochezia. She is asymptomatic from the anemia. We will observe for now.  She does not require transfusion now.   

## 2014-07-19 NOTE — Telephone Encounter (Signed)
Authorization number [id number]. Prescription faxed to [facility]

## 2014-07-19 NOTE — Patient Instructions (Signed)
[facility] Discharge Instructions for Patients Receiving Chemotherapy  Today you received the following chemotherapy agents:  Elotuzumab  To help prevent nausea and vomiting after your treatment, we encourage you to take your nausea medication as ordered per MD.   If you develop nausea and vomiting that is not controlled by your nausea medication, call the clinic.   BELOW ARE SYMPTOMS THAT SHOULD BE REPORTED IMMEDIATELY:  *FEVER GREATER THAN 100.5 F  *CHILLS WITH OR WITHOUT FEVER  NAUSEA AND VOMITING THAT IS NOT CONTROLLED WITH YOUR NAUSEA MEDICATION  *UNUSUAL SHORTNESS OF BREATH  *UNUSUAL BRUISING OR BLEEDING  TENDERNESS IN MOUTH AND THROAT WITH OR WITHOUT PRESENCE OF ULCERS  *URINARY PROBLEMS  *BOWEL PROBLEMS  UNUSUAL RASH Items with * indicate a potential emergency and should be followed up as soon as possible.  Feel free to call the clinic you have any questions or concerns. The clinic phone number is [phone].    

## 2014-07-19 NOTE — Assessment & Plan Note (Signed)
This is new on her x-ray. I will control her with pain medication right now. We discussed narcotic refill policy and side effects to be expected. I will consult interventional radiologist for kyphoplasty. She will continue Zometa to keep her bones strong. The patient is on calcium and vitamin D.

## 2014-07-19 NOTE — Progress Notes (Signed)
OK to start Zometa today per Dr. Alvy Bimler

## 2014-07-20 ENCOUNTER — Telehealth (HOSPITAL_COMMUNITY): Payer: Self-pay | Admitting: Interventional Radiology

## 2014-07-20 ENCOUNTER — Other Ambulatory Visit: Payer: Self-pay | Admitting: Hematology and Oncology

## 2014-07-20 ENCOUNTER — Telehealth: Payer: Self-pay | Admitting: *Deleted

## 2014-07-20 DIAGNOSIS — M4850XA Collapsed vertebra, not elsewhere classified, site unspecified, initial encounter for fracture: Secondary | ICD-10-CM

## 2014-07-20 DIAGNOSIS — IMO0001 Reserved for inherently not codable concepts without codable children: Secondary | ICD-10-CM

## 2014-07-20 DIAGNOSIS — C9000 Multiple myeloma not having achieved remission: Secondary | ICD-10-CM

## 2014-07-20 NOTE — Telephone Encounter (Signed)
[facility] left a message stating they are not in network for patient. Revlimid being sent to [facility]

## 2014-07-20 NOTE — Telephone Encounter (Signed)
Pt called and wanted to know when her appointment was with Dr. Estanislado Pandy for a newly diagnosed compression fracture by Dr. Alvy Bimler. I told her that I knew nothing about this referral. I checked her chart and the order was place incorrectly by Dr. Alvy Bimler. I emailed Dr. Alvy Bimler and told the patient that either Dr. Calton Dach office or myself would be back in touch with her as soon as I got a response to the email I had sent Dr. Alvy Bimler. She was in agreement with this plan. JM

## 2014-07-23 ENCOUNTER — Telehealth: Payer: Self-pay | Admitting: *Deleted

## 2014-07-23 NOTE — Telephone Encounter (Signed)
Received fax from [facility] stating they are transferring Revlimid to [facility] as they are out of network

## 2014-07-24 ENCOUNTER — Ambulatory Visit
Admission: RE | Admit: 2014-07-24 | Discharge: 2014-07-24 | Disposition: A | Discharge status: Home / Self Care | Payer: Medicare Other | Source: Ambulatory Visit | Attending: Hematology and Oncology | Admitting: Hematology and Oncology

## 2014-07-24 DIAGNOSIS — M4850XA Collapsed vertebra, not elsewhere classified, site unspecified, initial encounter for fracture: Secondary | ICD-10-CM

## 2014-07-24 DIAGNOSIS — C9000 Multiple myeloma not having achieved remission: Secondary | ICD-10-CM

## 2014-07-24 DIAGNOSIS — IMO0001 Reserved for inherently not codable concepts without codable children: Secondary | ICD-10-CM

## 2014-07-25 ENCOUNTER — Encounter (HOSPITAL_COMMUNITY): Payer: Self-pay | Admitting: Emergency Medicine

## 2014-07-25 ENCOUNTER — Telehealth: Payer: Self-pay | Admitting: *Deleted

## 2014-07-25 ENCOUNTER — Emergency Department (HOSPITAL_COMMUNITY)
Admission: EM | Admit: 2014-07-25 | Discharge: 2014-07-25 | Disposition: A | Discharge status: Home / Self Care | Payer: Medicare Other | Attending: Emergency Medicine | Admitting: Emergency Medicine

## 2014-07-25 DIAGNOSIS — Z8579 Personal history of other malignant neoplasms of lymphoid, hematopoietic and related tissues: Secondary | ICD-10-CM | POA: Diagnosis not present

## 2014-07-25 DIAGNOSIS — R197 Diarrhea, unspecified: Secondary | ICD-10-CM | POA: Diagnosis not present

## 2014-07-25 DIAGNOSIS — M549 Dorsalgia, unspecified: Secondary | ICD-10-CM

## 2014-07-25 DIAGNOSIS — Z853 Personal history of malignant neoplasm of breast: Secondary | ICD-10-CM | POA: Insufficient documentation

## 2014-07-25 DIAGNOSIS — Z79899 Other long term (current) drug therapy: Secondary | ICD-10-CM | POA: Diagnosis not present

## 2014-07-25 DIAGNOSIS — M545 Low back pain: Secondary | ICD-10-CM | POA: Insufficient documentation

## 2014-07-25 DIAGNOSIS — G629 Polyneuropathy, unspecified: Secondary | ICD-10-CM | POA: Insufficient documentation

## 2014-07-25 DIAGNOSIS — I10 Essential (primary) hypertension: Secondary | ICD-10-CM | POA: Diagnosis not present

## 2014-07-25 DIAGNOSIS — Z8781 Personal history of (healed) traumatic fracture: Secondary | ICD-10-CM | POA: Insufficient documentation

## 2014-07-25 DIAGNOSIS — Z7982 Long term (current) use of aspirin: Secondary | ICD-10-CM | POA: Insufficient documentation

## 2014-07-25 LAB — URINALYSIS, ROUTINE W REFLEX MICROSCOPIC
Bilirubin Urine: NEGATIVE
Glucose, UA: NEGATIVE mg/dL
Ketones, ur: 15 mg/dL — AB
Nitrite: NEGATIVE
Protein, ur: NEGATIVE mg/dL
Specific Gravity, Urine: 1.019 (ref 1.005–1.030)
Urobilinogen, UA: 0.2 mg/dL (ref 0.0–1.0)
pH: 5.5 (ref 5.0–8.0)

## 2014-07-25 LAB — COMPREHENSIVE METABOLIC PANEL WITH GFR
ALT: 14 U/L (ref 0–35)
AST: 17 U/L (ref 0–37)
Albumin: 3.4 g/dL — ABNORMAL LOW (ref 3.5–5.2)
Alkaline Phosphatase: 56 U/L (ref 39–117)
Anion gap: 8 (ref 5–15)
BUN: 11 mg/dL (ref 6–23)
CO2: 25 mmol/L (ref 19–32)
Calcium: 8 mg/dL — ABNORMAL LOW (ref 8.4–10.5)
Chloride: 99 meq/L (ref 96–112)
Creatinine, Ser: 0.56 mg/dL (ref 0.50–1.10)
GFR calc Af Amer: >90 mL/min (ref >90)
GFR calc non Af Amer: 82 mL/min — ABNORMAL LOW (ref >90)
Glucose, Bld: 82 mg/dL (ref 70–99)
Potassium: 3.7 mmol/L (ref 3.5–5.1)
Sodium: 132 mmol/L — ABNORMAL LOW (ref 135–145)
Total Bilirubin: 0.8 mg/dL (ref 0.3–1.2)
Total Protein: 6.4 g/dL (ref 6.0–8.3)

## 2014-07-25 LAB — CBC WITH DIFFERENTIAL/PLATELET
Basophils Absolute: 0 K/uL (ref 0.0–0.1)
Basophils Relative: 1 % (ref 0–1)
Eosinophils Absolute: 0 K/uL (ref 0.0–0.7)
Eosinophils Relative: 2 % (ref 0–5)
HCT: 30.7 % — ABNORMAL LOW (ref 36.0–46.0)
Hemoglobin: 10.3 g/dL — ABNORMAL LOW (ref 12.0–15.0)
Lymphocytes Relative: 21 % (ref 12–46)
Lymphs Abs: 0.5 K/uL — ABNORMAL LOW (ref 0.7–4.0)
MCH: 33.1 pg (ref 26.0–34.0)
MCHC: 33.6 g/dL (ref 30.0–36.0)
MCV: 98.7 fL (ref 78.0–100.0)
Monocytes Absolute: 0.3 K/uL (ref 0.1–1.0)
Monocytes Relative: 14 % — ABNORMAL HIGH (ref 3–12)
Neutro Abs: 1.3 K/uL — ABNORMAL LOW (ref 1.7–7.7)
Neutrophils Relative %: 62 % (ref 43–77)
Platelets: 243 K/uL (ref 150–400)
RBC: 3.11 MIL/uL — ABNORMAL LOW (ref 3.87–5.11)
RDW: 13.6 % (ref 11.5–15.5)
WBC: 2.1 K/uL — ABNORMAL LOW (ref 4.0–10.5)

## 2014-07-25 LAB — URINE MICROSCOPIC-ADD ON

## 2014-07-25 MED ORDER — HEPARIN SOD (PORK) LOCK FLUSH 100 UNIT/ML IV SOLN
500.0000 [IU] | Freq: Once | INTRAVENOUS | Status: AC
  Administered 2014-07-25: 500 [IU]
  Filled 2014-07-25: qty 5

## 2014-07-25 MED ORDER — SODIUM CHLORIDE 0.9 % IV BOLUS (SEPSIS)
500.0000 mL | Freq: Once | INTRAVENOUS | Status: AC
  Administered 2014-07-25: 500 mL via INTRAVENOUS

## 2014-07-25 MED ORDER — OXYCODONE HCL 5 MG PO TABS: 5.0000 mg | ORAL_TABLET | Freq: Once | ORAL | Status: DC

## 2014-07-25 MED ORDER — HYDROMORPHONE HCL 1 MG/ML IJ SOLN
1.0000 mg | Freq: Once | INTRAMUSCULAR | Status: AC
  Administered 2014-07-25: 1 mg via INTRAVENOUS
  Filled 2014-07-25: qty 1

## 2014-07-25 NOTE — ED Provider Notes (Signed)
CSN: [id number]     Arrival date & time 07/25/14  1221 History   First MD Initiated Contact with Patient 07/25/14 1227     Chief Complaint  Patient presents with  . Back Pain  . Diarrhea     Patient is a 81 y.o. female presenting with back pain and diarrhea. The history is provided by the patient. No language interpreter was used.  Back Pain Diarrhea  Natasha Jackson presents for evaluation of back pain. Her pain started one week ago.  Pain is located in the mid back and is waxing and waning, better with lying down and sitting down, worse with movement. No radiating pain.  No numbness or weakness. She was seen one week ago for the pain and started on dilaudid a week ago, which helped a little bit for her pain.  She last took the dilaudid yesterday at 3pm.  She stopped the pain medications due to constipation.  Last BM was four days ago.  Yesterday she took a stool softener in the morning and evening, as well as two bisacodyl.  This morning she developed diarrhea (two bowel movements today). No fevers, abdominal pain, vomiting, dysuria.    Past Medical History  Diagnosis Date  . Breast cancer   . Multiple myeloma   . Aortic regurgitation   . HTN (hypertension)   . Hot flash not due to menopause 12/23/2011  . Neuropathy 12/23/2011  . DCIS (ductal carcinoma in situ) 08/04/2012   Past Surgical History  Procedure Laterality Date  . Lymph node excision - left groin    . Incisional hernia repair     Family History  Problem Relation Age of Onset  . Heart failure Mother   . Stroke Father    History  Substance Use Topics  . Smoking status: Never Smoker   . Smokeless tobacco: Never Used  . Alcohol Use: No   OB History    No data available     Review of Systems  Gastrointestinal: Positive for diarrhea.  Musculoskeletal: Positive for back pain.  All other systems reviewed and are negative.     Allergies  Review of patient's allergies indicates no known allergies.  Home Medications    Prior to Admission medications   Medication Sig Start Date End Date Taking? Authorizing Provider  aspirin 325 MG EC tablet Take 325 mg by mouth daily.   Yes Historical Provider, MD  gabapentin (NEURONTIN) 300 MG capsule Take 1 capsule (300 mg total) by mouth 3 (three) times daily. Patient taking differently: Take 300 mg by mouth at bedtime.  10/13/13  Yes Heath Lark, MD  HYDROmorphone (DILAUDID) 4 MG tablet Take 1 tablet (4 mg total) by mouth every 4 (four) hours as needed for severe pain. 07/18/14  Yes Heath Lark, MD  levothyroxine (SYNTHROID, LEVOTHROID) 75 MCG tablet Take 75 mcg by mouth daily before breakfast.   Yes Historical Provider, MD  meloxicam (MOBIC) 7.5 MG tablet Take 7.5 mg by mouth 2 (two) times daily as needed for pain.  05/06/11  Yes Historical Provider, MD  valsartan (DIOVAN) 160 MG tablet Take 80 mg by mouth at bedtime.    Yes Historical Provider, MD  acetaminophen (TYLENOL) 325 MG tablet Take 650 mg by mouth every 6 (six) hours as needed (Pain).     Historical Provider, MD  cholecalciferol (VITAMIN D) 1000 UNITS tablet Take 1,000 Units by mouth daily. 05/01/13   Heath Lark, MD  diphenhydramine-acetaminophen (TYLENOL PM) 25-500 MG TABS Take 1 tablet by mouth at  bedtime as needed.    Historical Provider, MD  lenalidomide (REVLIMID) 10 MG capsule Take 1 capsule (10 mg total) by mouth daily. 07/19/14   Heath Lark, MD  lidocaine-prilocaine (EMLA) cream Apply 1 application topically as needed. Apply to [facility] a cath site at least one hour prior to [employer]. 06/21/14   Heath Lark, MD  loperamide (IMODIUM A-D) 2 MG tablet Take 2 mg by mouth as needed for diarrhea or loose stools (as directed.).    Historical Provider, MD  traMADol (ULTRAM) 50 MG tablet Take 1 tablet (50 mg total) by mouth every 6 (six) hours as needed for severe pain. 06/18/14   Heath Lark, MD  valsartan (DIOVAN) 160 MG tablet TAKE ONE TABLET BY MOUTH ONE TIME DAILY  Patient taking differently: TAKE ONE TABLET BY  MOUTH ONE TIME DAILY 07/18/14   Josue Hector, MD   BP 146/54 mmHg  Pulse 66  Temp(Src) 98.2 F (36.8 C) (Oral)  Resp 19  SpO2 99% Physical Exam  Constitutional: She is oriented to person, place, and time. She appears well-developed and well-nourished.  HENT:  Head: Normocephalic and atraumatic.  Cardiovascular: Normal rate and regular rhythm.   No murmur heard. Pulmonary/Chest: Effort normal and breath sounds normal. No respiratory distress.  Abdominal: Soft. There is no tenderness. There is no rebound and no guarding.  Musculoskeletal: She exhibits no edema or tenderness.  Mid lumbar tenderness to palpation  Neurological: She is alert and oriented to person, place, and time.  5/5 strength and sensation in BLE  Skin: Skin is warm and dry.  Psychiatric: She has a normal mood and affect. Her behavior is normal.  Nursing note and vitals reviewed.   ED Course  Procedures (including critical care time) Labs Review Labs Reviewed  COMPREHENSIVE METABOLIC PANEL - Abnormal; Notable for the following:    Sodium 132 (*)    Calcium 8.0 (*)    Albumin 3.4 (*)    GFR calc non Af Amer 82 (*)    All other components within normal limits  CBC WITH DIFFERENTIAL - Abnormal; Notable for the following:    WBC 2.1 (*)    RBC 3.11 (*)    Hemoglobin 10.3 (*)    HCT 30.7 (*)    Neutro Abs 1.3 (*)    Lymphs Abs 0.5 (*)    Monocytes Relative 14 (*)    All other components within normal limits  URINALYSIS, ROUTINE W REFLEX MICROSCOPIC - Abnormal; Notable for the following:    Hgb urine dipstick TRACE (*)    Ketones, ur 15 (*)    Leukocytes, UA TRACE (*)    All other components within normal limits  URINE MICROSCOPIC-ADD ON - Abnormal; Notable for the following:    Squamous Epithelial / LPF FEW (*)    Bacteria, UA FEW (*)    All other components within normal limits    Imaging Review Mr Thoracic Spine Wo Contrast  07/24/2014   CLINICAL DATA:  Increasing back pain for 1 week after lifting  injury. Multiple myeloma.  EXAM: MRI THORACIC SPINE WITHOUT CONTRAST  TECHNIQUE: Multiplanar, multisequence MR imaging of the thoracic spine was performed. No intravenous contrast was administered.  COMPARISON:  Radiographs dated 07/18/2014  FINDINGS: There is a subacute compression fracture of the superior endplate of K80. There is minimal protrusion of the posterior superior aspect of the T12 vertebra into the spinal canal with no neural impingement. This extends approximately 3 mm. No disc protrusion or bulging. The appearance is  typical for benign osteoporotic compression fracture. There is a 5 mm lesion in the anterior left central aspect of T2 and an 8 mm lesion in the central left aspect of T6. These lesions are indeterminate in etiology. They are not appreciable on the prior CT scan of the chest dated 06/08/2005. They are not typical for hemangiomas.  There is scarring in both lung apices in there are tiny bilateral pleural effusions.  The thoracic spinal cord appears normal. No foraminal or spinal stenosis.  IMPRESSION: 1. Slight benign appearing compression fracture of T12, subacute. 2. Small lesions in T2 and T6 which are indeterminate in etiology.   Electronically Signed   By: Rozetta Nunnery M.D.   On: 07/24/2014 16:23     EKG Interpretation None      MDM   Final diagnoses:  Back pain    Patient with history of T12 compression fracture that is mildly was sustained about a week ago here for back pain and diarrhea. She stopped her pain medications yesterday due to constipation and took multiple laxatives yesterday. She is not having diarrhea but had 2 bowel movements today. BMP with mild hyponatremia which is stable compared to priors. UA is not consistent with UTI. Discussed with patient importance of continuing her home pain medications and starting medications for constipation. Discussed importance of oral fluid hydration. Recommend starting MiraLAX and Colace daily patient maintain regular  bowel movements. Discussed the case with Dr. Estanislado Pandy, who will see the patient in follow-up. Patient given contact information for follow-up with Dr. Estanislado Pandy. Clinical picture are not consistent with tears bacterial infection or cauda equina.    Quintella Reichert, MD 07/25/14 1610

## 2014-07-25 NOTE — Telephone Encounter (Signed)
S/w pt's daughter, Juliann Pulse.  She is very concerned about pt's pain.  Says her mother took the pain pill, then she took two benadryl and then one of her husbands "nerve pills."  She asks if anything else can help her pain at this time?  We discussed that pt can go to ED for possible admission for pain management.  Pt can call ambulance to take her to [facility] ED.   Juliann Pulse verbalized she thinks this is a good plan.  Attempted to call pt back several times and her line was busy.  Was finally able to reach pt and she says she did take benadryl for nausea and one "nerve pill."   She is laying down.  She does not want to go to hospital at this time.  She says she wants to try taking pain med at home as ordered and take stool softeners and miralax as recommended.   Cautioned pt about taking too many sedating medications at once and risk of falls.  She verbalized understanding.  Encouraged pt to call ambulance to take her to hospital if she is taking dilaudid as prescribed and it is not managing her pain.  She verbalized understanding but thinks she is ok right now.  Informed her I will call Dr. Arlean Hopping office to f/u on referral for possible kyphoplasty.

## 2014-07-25 NOTE — Telephone Encounter (Signed)
S/w Anderson Malta at Dr. Arlean Hopping office.  Informed her of MRI thoracic spine done yesterday and pt anxious to find out if any procedure can be done to help her back pain.  Anderson Malta will inform Dr. Estanislado Pandy today when he is out of surgery and she will call patient.

## 2014-07-25 NOTE — Telephone Encounter (Signed)
Pt states having a very difficult time w/ her back pain.  The dilaudid does help some but it is causing bad constipation.   Pt had a BM this morning after being constipated x 4 days.   She has been taking a few stool softeners and then took 3 dulcolax pills last night.  She is also drinking prune juice.  Pt asks if there is anything that can be done to fix her back pain besides pain meds?   She had MRI done of Thoracic spine yesterday but was in too much pain to lay still for the Lumbar spine MRI.    Instructed pt to take her dilaudid as needed for pain.  Take Miralax every day and take one stool softener every time she takes a dilaudid pill.   She can back off on the stool softeners if she has loose stools. Discussed w/ pt that pain management and constipation prevention are very important.  Instructed her to call back if her pain and constipation are not controlled w/ above regimen.   Notified Dr. Alvy Bimler of above and she agrees w/ above measures for pain management and constipation.  She instructs to f/u on referral to Dr. Estanislado Pandy regarding any interventions such asKyphoplasty for compression fractures.

## 2014-07-25 NOTE — ED Notes (Signed)
Bed: WA23 Expected date:  Expected time:  Means of arrival:  Comments: EMS-back pain 

## 2014-07-25 NOTE — ED Notes (Signed)
Pt was recently diagnosed with a t12 compression fracture on 1/13, pt was put on pain meds and took stool softer this morning due to not having BM in 4 days, pt had uncontrollable diarrhea this morning. Pt here for back pain and diarrhea.

## 2014-07-25 NOTE — ED Notes (Signed)
Patient tried to urinate earlier and was unable to go, we are going to wait until fluids are ran a bit and then try again.

## 2014-07-25 NOTE — Telephone Encounter (Signed)
Received VM from pt's daughter, Juliann Pulse,  Pt did end up going to ED at [facility] for Pain control.

## 2014-07-25 NOTE — Discharge Instructions (Signed)
Continue taking your pain medications as prescribed.  Drink plenty of fluids.  Call Dr. Arlean Hopping office in the morning for appointment later this week.  Take miralax, once daily.  Take colace once daily.     Back Pain, Adult Low back pain is very common. About 1 in 5 people have back pain.The cause of low back pain is rarely dangerous. The pain often gets better over time.About half of people with a sudden onset of back pain feel better in just 2 weeks. About 8 in 10 people feel better by 6 weeks.  CAUSES Some common causes of back pain include:  Strain of the muscles or ligaments supporting the spine.  Wear and tear (degeneration) of the spinal discs.  Arthritis.  Direct injury to the back. DIAGNOSIS Most of the time, the direct cause of low back pain is not known.However, back pain can be treated effectively even when the exact cause of the pain is unknown.Answering your caregiver's questions about your overall health and symptoms is one of the most accurate ways to make sure the cause of your pain is not dangerous. If your caregiver needs more information, he or she may order lab work or imaging tests (X-rays or MRIs).However, even if imaging tests show changes in your back, this usually does not require surgery. HOME CARE INSTRUCTIONS For many people, back pain returns.Since low back pain is rarely dangerous, it is often a condition that people can learn to [facility] their own.   Remain active. It is stressful on the back to sit or stand in one place. Do not sit, drive, or stand in one place for more than 30 minutes at a time. Take short walks on level surfaces as soon as pain allows.Try to increase the length of time you walk each day.  Do not stay in bed.Resting more than 1 or 2 days can delay your recovery.  Do not avoid exercise or work.Your body is made to move.It is not dangerous to be active, even though your back may hurt.Your back will likely heal faster if you  return to being active before your pain is gone.  Pay attention to your body when you bend and lift. Many people have less discomfortwhen lifting if they bend their knees, keep the load close to their bodies,and avoid twisting. Often, the most comfortable positions are those that put less stress on your recovering back.  Find a comfortable position to sleep. Use a firm mattress and lie on your side with your knees slightly bent. If you lie on your back, put a pillow under your knees.  Only take over-the-counter or prescription medicines as directed by your caregiver. Over-the-counter medicines to reduce pain and inflammation are often the most helpful.Your caregiver may prescribe muscle relaxant drugs.These medicines help dull your pain so you can more quickly return to your normal activities and healthy exercise.  Put ice on the injured area.  Put ice in a plastic bag.  Place a towel between your skin and the bag.  Leave the ice on for 15-20 minutes, 03-04 times a day for the first 2 to 3 days. After that, ice and heat may be alternated to reduce pain and spasms.  Ask your caregiver about trying back exercises and gentle massage. This may be of some benefit.  Avoid feeling anxious or stressed.Stress increases muscle tension and can worsen back pain.It is important to recognize when you are anxious or stressed and learn ways to manage it.Exercise is a great option. SEEK  MEDICAL CARE IF:  You have pain that is not relieved with rest or medicine.  You have pain that does not improve in 1 week.  You have new symptoms.  You are generally not feeling well. SEEK IMMEDIATE MEDICAL CARE IF:   You have pain that radiates from your back into your legs.  You develop new bowel or bladder control problems.  You have unusual weakness or numbness in your arms or legs.  You develop nausea or vomiting.  You develop abdominal pain.  You feel faint. Document Released: 06/22/2005  Document Revised: 12/22/2011 Document Reviewed: 10/24/2013 [facility] Patient Information 2015 [place], [place]. This information is not intended to replace advice given to you by your health care provider. Make sure you discuss any questions you have with your health care provider.

## 2014-07-26 ENCOUNTER — Ambulatory Visit: Payer: Medicare Other

## 2014-07-26 ENCOUNTER — Other Ambulatory Visit: Payer: Medicare Other

## 2014-07-26 ENCOUNTER — Telehealth: Payer: Self-pay | Admitting: Hematology and Oncology

## 2014-07-26 ENCOUNTER — Other Ambulatory Visit (HOSPITAL_BASED_OUTPATIENT_CLINIC_OR_DEPARTMENT_OTHER): Payer: Medicare Other

## 2014-07-26 ENCOUNTER — Other Ambulatory Visit (HOSPITAL_COMMUNITY): Payer: Self-pay | Admitting: Interventional Radiology

## 2014-07-26 ENCOUNTER — Ambulatory Visit (HOSPITAL_BASED_OUTPATIENT_CLINIC_OR_DEPARTMENT_OTHER): Payer: Medicare Other

## 2014-07-26 DIAGNOSIS — Z95828 Presence of other vascular implants and grafts: Secondary | ICD-10-CM

## 2014-07-26 DIAGNOSIS — C9000 Multiple myeloma not having achieved remission: Secondary | ICD-10-CM

## 2014-07-26 DIAGNOSIS — Z5112 Encounter for antineoplastic immunotherapy: Secondary | ICD-10-CM

## 2014-07-26 DIAGNOSIS — S22080A Wedge compression fracture of T11-T12 vertebra, initial encounter for closed fracture: Secondary | ICD-10-CM

## 2014-07-26 LAB — COMPREHENSIVE METABOLIC PANEL (CC13)
ALK PHOS: 60 U/L (ref 40–150)
ALT: 9 U/L (ref 0–55)
ANION GAP: 9 meq/L (ref 3–11)
AST: 15 U/L (ref 5–34)
Albumin: 3.2 g/dL — ABNORMAL LOW (ref 3.5–5.0)
BILIRUBIN TOTAL: 0.43 mg/dL (ref 0.20–1.20)
BUN: 9.4 mg/dL (ref 7.0–26.0)
CO2: 26 meq/L (ref 22–29)
Calcium: 7.9 mg/dL — ABNORMAL LOW (ref 8.4–10.4)
Chloride: 99 mEq/L (ref 98–109)
Creatinine: 0.7 mg/dL (ref 0.6–1.1)
EGFR: 79 mL/min/{1.73_m2} — ABNORMAL LOW (ref >90)
GLUCOSE: 84 mg/dL (ref 70–140)
Potassium: 4.1 mEq/L (ref 3.5–5.1)
Sodium: 134 mEq/L — ABNORMAL LOW (ref 136–145)
Total Protein: 6.7 g/dL (ref 6.4–8.3)

## 2014-07-26 LAB — CBC WITH DIFFERENTIAL/PLATELET
BASO%: 1.4 % (ref 0.0–2.0)
BASOS ABS: 0 10*3/uL (ref 0.0–0.1)
EOS ABS: 0.1 10*3/uL (ref 0.0–0.5)
EOS%: 1.8 % (ref 0.0–7.0)
HEMATOCRIT: 31.4 % — AB (ref 34.8–46.6)
HGB: 10.1 g/dL — ABNORMAL LOW (ref 11.6–15.9)
LYMPH%: 18.1 % (ref 14.0–49.7)
MCH: 32.3 pg (ref 25.1–34.0)
MCHC: 32.2 g/dL (ref 31.5–36.0)
MCV: 100.4 fL (ref 79.5–101.0)
MONO#: 0.4 10*3/uL (ref 0.1–0.9)
MONO%: 14.1 % — AB (ref 0.0–14.0)
NEUT#: 1.9 10*3/uL (ref 1.5–6.5)
NEUT%: 64.6 % (ref 38.4–76.8)
Platelets: 248 10*3/uL (ref 145–400)
RBC: 3.13 10*6/uL — AB (ref 3.70–5.45)
RDW: 13.6 % (ref 11.2–14.5)
WBC: 3 10*3/uL — ABNORMAL LOW (ref 3.9–10.3)
lymph#: 0.5 10*3/uL — ABNORMAL LOW (ref 0.9–3.3)

## 2014-07-26 MED ORDER — HEPARIN SOD (PORK) LOCK FLUSH 100 UNIT/ML IV SOLN
500.0000 [IU] | Freq: Once | INTRAVENOUS | Status: DC
  Administered 2014-07-26: 500 [IU] via INTRAVENOUS
  Filled 2014-07-26: qty 5

## 2014-07-26 MED ORDER — FAMOTIDINE IN NACL 20-0.9 MG/50ML-% IV SOLN
INTRAVENOUS | Status: AC
  Filled 2014-07-26: qty 50

## 2014-07-26 MED ORDER — DIPHENHYDRAMINE HCL 25 MG PO CAPS
50.0000 mg | ORAL_CAPSULE | Freq: Once | ORAL | Status: AC
Start: 2014-07-26 — End: 2014-07-26
  Administered 2014-07-26: 50 mg via ORAL

## 2014-07-26 MED ORDER — FAMOTIDINE IN NACL 20-0.9 MG/50ML-% IV SOLN
20.0000 mg | Freq: Once | INTRAVENOUS | Status: AC
  Administered 2014-07-26: 20 mg via INTRAVENOUS

## 2014-07-26 MED ORDER — SODIUM CHLORIDE 0.9 % IJ SOLN
10.0000 mL | INTRAMUSCULAR | Status: DC | PRN
  Filled 2014-07-26: qty 10

## 2014-07-26 MED ORDER — SODIUM CHLORIDE 0.9 % IJ SOLN
10.0000 mL | INTRAMUSCULAR | Status: DC | PRN
  Administered 2014-07-26: 10 mL
  Filled 2014-07-26: qty 10

## 2014-07-26 MED ORDER — DEXAMETHASONE SODIUM PHOSPHATE 10 MG/ML IJ SOLN
INTRAMUSCULAR | Status: AC
  Filled 2014-07-26: qty 1

## 2014-07-26 MED ORDER — SODIUM CHLORIDE 0.9 % IV SOLN
INTRAVENOUS | Status: DC
  Administered 2014-07-26: 13:00:00 via INTRAVENOUS

## 2014-07-26 MED ORDER — DIPHENHYDRAMINE HCL 25 MG PO CAPS
ORAL_CAPSULE | ORAL | Status: AC
  Filled 2014-07-26: qty 2

## 2014-07-26 MED ORDER — ACETAMINOPHEN 325 MG PO TABS
650.0000 mg | ORAL_TABLET | Freq: Once | ORAL | Status: AC
  Administered 2014-07-26: 650 mg via ORAL

## 2014-07-26 MED ORDER — HEPARIN SOD (PORK) LOCK FLUSH 100 UNIT/ML IV SOLN
500.0000 [IU] | Freq: Once | INTRAVENOUS | Status: AC | PRN
  Administered 2014-07-26: 500 [IU]
  Filled 2014-07-26: qty 5

## 2014-07-26 MED ORDER — ACETAMINOPHEN 325 MG PO TABS
ORAL_TABLET | ORAL | Status: AC
  Filled 2014-07-26: qty 2

## 2014-07-26 MED ORDER — DEXAMETHASONE SODIUM PHOSPHATE 10 MG/ML IJ SOLN
10.0000 mg | Freq: Once | INTRAMUSCULAR | Status: AC
  Administered 2014-07-26: 10 mg via INTRAVENOUS

## 2014-07-26 MED ORDER — SODIUM CHLORIDE 0.9 % IV SOLN
Freq: Once | Status: AC
  Administered 2014-07-26: 13:00:00 via INTRAVENOUS
  Filled 2014-07-26: qty 230

## 2014-07-26 NOTE — Telephone Encounter (Signed)
returned call and s.w pt and confirmed that her tx is in a bed....pt ok

## 2014-07-29 ENCOUNTER — Other Ambulatory Visit: Payer: Self-pay | Admitting: Radiology

## 2014-07-30 ENCOUNTER — Telehealth: Payer: Self-pay | Admitting: *Deleted

## 2014-07-30 ENCOUNTER — Other Ambulatory Visit: Payer: Self-pay | Admitting: Radiology

## 2014-07-30 NOTE — Telephone Encounter (Signed)
Pt's daughter reports pt's pain not controlled by taking Dilaudid 4 mg every 4 hrs and she is also feeling constipated again.  Pt took one of her husband's oxycodone pills and says this helped her pain some, but daughter does not know the dose of that med.   Pt is currently visiting her husband at hospital, who unfortunately is hospitalized at this time.  Pt has been trying to walk around hospital and having some severe back pains.  She is scheduled for a procedure on her back in IR tomorrow.  Instructed daughter, per Dr. Alvy Bimler,  Pt may take two Dilaudid every 4 hrs for pain.  Do not take any other pain medication without notifying MD first.  Asked daughter to reinforce w/ pt to take stool softener w/ every dose of Dilaudid and to add miralax once daily if still constipated.   Also asked her to instruct pt to call us if taking Two Dilaudid every 4 hrs does not help her pain.  She verbalized understanding.

## 2014-07-30 NOTE — Telephone Encounter (Signed)
Called daughter back and she says pt only wants to take the oxycodone she has 2 pills left now and she says she will not take the dilaudid due to constipation and nausea.   Offered to ask Dr. Alvy Bimler for Rx for oxycodone but daughter says she doesn't think there is anyone who can pick it up.  She says she thinks her mother will be ok until she can get procedure done tomorrow.  Informed daughter to call us back if pt wants Rx for oxycodone instead of dilaudid. Informed daughter that oxycodone it is just as constipating as dilaudid.  Daughter verbalized understanding but says pt thinks it doesn't make her as nauseated as the dilaudid.   She will call back if pt wants Rx for oxycodone.   Also instructed daughter she can take pt to ED if her pain is not managed.  She verbalized understanding.

## 2014-07-30 NOTE — Telephone Encounter (Signed)
PT. IS REFUSING TO TAKE THE DILAUDID 4MG  TWO TABS EVERY FOURS PLUS A LAXATIVE DUE TO NAUSEA AND CONSTIPATION. PT. HAD A SMALL BOWEL MOVEMENT ON 07/29/14. PT. HAD SOME OXYCODONE SO SHE TOOK ONE TAB LAST NIGHT AND ONE TAB TODAY WITH TYLENOL. PT. HAS TWO TABLETS LEFT OF THE OXYCODONE. PT.'S DAUGHTER WANTS HER MOTHER TO HAVE SOME RELIEF FROM HER BACK PAIN TODAY. ALSO  PT.'S HUSBAND HAS BEEN ADMITTED TO [facility] WITH BLEEDING.

## 2014-07-30 NOTE — Telephone Encounter (Signed)
MESSAGE RECEIVED IN TRIAGE.  KATHY LEE CALLING FOR PT.  Pt having pain " she is unable to move - "  She is scheduled for surgery tomorrow "  ""can we give her an oxycodone ?"  " she is nauseated and constipated and bloated "  [phone].  THIS NOTE WILL BE SENT TO DESK RN FOR APPROPRIATE FOLLOW UP AND RETURN CALL.

## 2014-07-31 ENCOUNTER — Ambulatory Visit (HOSPITAL_COMMUNITY)
Admission: RE | Admit: 2014-07-31 | Discharge: 2014-07-31 | Disposition: A | Discharge status: Home / Self Care | Payer: Medicare Other | Source: Ambulatory Visit | Attending: Interventional Radiology | Admitting: Interventional Radiology

## 2014-07-31 ENCOUNTER — Encounter (HOSPITAL_COMMUNITY): Payer: Self-pay

## 2014-07-31 ENCOUNTER — Other Ambulatory Visit (HOSPITAL_COMMUNITY): Payer: Self-pay | Admitting: Interventional Radiology

## 2014-07-31 DIAGNOSIS — S22080A Wedge compression fracture of T11-T12 vertebra, initial encounter for closed fracture: Secondary | ICD-10-CM

## 2014-07-31 DIAGNOSIS — E860 Dehydration: Secondary | ICD-10-CM | POA: Diagnosis not present

## 2014-07-31 DIAGNOSIS — R197 Diarrhea, unspecified: Secondary | ICD-10-CM | POA: Diagnosis not present

## 2014-07-31 LAB — BASIC METABOLIC PANEL
ANION GAP: 8 (ref 5–15)
BUN: 8 mg/dL (ref 6–23)
CALCIUM: 8.1 mg/dL — AB (ref 8.4–10.5)
CO2: 24 mmol/L (ref 19–32)
Chloride: 104 mmol/L (ref 96–112)
Creatinine, Ser: 0.66 mg/dL (ref 0.50–1.10)
GFR, EST NON AFRICAN AMERICAN: 78 mL/min — AB (ref >90)
GLUCOSE: 91 mg/dL (ref 70–99)
POTASSIUM: 3.8 mmol/L (ref 3.5–5.1)
SODIUM: 136 mmol/L (ref 135–145)

## 2014-07-31 LAB — APTT: aPTT: 30 seconds (ref 24–37)

## 2014-07-31 LAB — SPEP & IFE WITH QIG
Albumin ELP: 50.2 % — ABNORMAL LOW (ref 55.8–66.1)
Alpha-1-Globulin: 5.6 % — ABNORMAL HIGH (ref 2.9–4.9)
Alpha-2-Globulin: 14.3 % — ABNORMAL HIGH (ref 7.1–11.8)
BETA 2: 3.4 % (ref 3.2–6.5)
BETA GLOBULIN: 5.9 % (ref 4.7–7.2)
Gamma Globulin: 20.6 % — ABNORMAL HIGH (ref 11.1–18.8)
IGG (IMMUNOGLOBIN G), SERUM: 1470 mg/dL (ref 690–1700)
IGM, SERUM: 86 mg/dL (ref 52–322)
IgA: 53 mg/dL — ABNORMAL LOW (ref 69–380)
M-SPIKE, %: 1 g/dL
TOTAL PROTEIN, SERUM ELECTROPHOR: 6.5 g/dL (ref 6.0–8.3)

## 2014-07-31 LAB — CBC WITH DIFFERENTIAL/PLATELET
Basophils Absolute: 0 10*3/uL (ref 0.0–0.1)
Basophils Relative: 1 % (ref 0–1)
EOS ABS: 0.1 10*3/uL (ref 0.0–0.7)
EOS PCT: 3 % (ref 0–5)
HCT: 30 % — ABNORMAL LOW (ref 36.0–46.0)
Hemoglobin: 10.1 g/dL — ABNORMAL LOW (ref 12.0–15.0)
LYMPHS ABS: 0.6 10*3/uL — AB (ref 0.7–4.0)
LYMPHS PCT: 18 % (ref 12–46)
MCH: 32.6 pg (ref 26.0–34.0)
MCHC: 33.7 g/dL (ref 30.0–36.0)
MCV: 96.8 fL (ref 78.0–100.0)
Monocytes Absolute: 0.3 10*3/uL (ref 0.1–1.0)
Monocytes Relative: 10 % (ref 3–12)
Neutro Abs: 2.3 10*3/uL (ref 1.7–7.7)
Neutrophils Relative %: 68 % (ref 43–77)
Platelets: 294 10*3/uL (ref 150–400)
RBC: 3.1 MIL/uL — ABNORMAL LOW (ref 3.87–5.11)
RDW: 13.9 % (ref 11.5–15.5)
WBC: 3.3 10*3/uL — AB (ref 4.0–10.5)

## 2014-07-31 LAB — BETA 2 MICROGLOBULIN, SERUM: BETA 2 MICROGLOBULIN: 2.71 mg/L — AB (ref <=2.51)

## 2014-07-31 LAB — KAPPA/LAMBDA LIGHT CHAINS
KAPPA FREE LGHT CHN: 7.14 mg/dL — AB (ref 0.33–1.94)
Kappa:Lambda Ratio: 4.7 — ABNORMAL HIGH (ref 0.26–1.65)
Lambda Free Lght Chn: 1.52 mg/dL (ref 0.57–2.63)

## 2014-07-31 LAB — PROTIME-INR
INR: 1.02 (ref 0.00–1.49)
PROTHROMBIN TIME: 13.5 s (ref 11.6–15.2)

## 2014-07-31 MED ORDER — CEFAZOLIN SODIUM-DEXTROSE 2-3 GM-% IV SOLR
2.0000 g | Freq: Once | INTRAVENOUS | Status: AC
  Administered 2014-07-31: 2 g via INTRAVENOUS

## 2014-07-31 MED ORDER — TOBRAMYCIN SULFATE 1.2 G IJ SOLR
INTRAMUSCULAR | Status: AC
  Filled 2014-07-31: qty 1.2

## 2014-07-31 MED ORDER — MIDAZOLAM HCL 2 MG/2ML IJ SOLN
INTRAMUSCULAR | Status: AC | PRN
  Administered 2014-07-31 (×4): 0.5 mg via INTRAVENOUS
  Administered 2014-07-31: 1 mg via INTRAVENOUS
  Administered 2014-07-31: 0.5 mg via INTRAVENOUS

## 2014-07-31 MED ORDER — SODIUM CHLORIDE 0.9 % IV SOLN
INTRAVENOUS | Status: DC
  Administered 2014-07-31: 09:00:00 via INTRAVENOUS

## 2014-07-31 MED ORDER — SODIUM CHLORIDE 0.9 % IV SOLN
INTRAVENOUS | Status: AC
Start: 2014-07-31 — End: 2014-07-31

## 2014-07-31 MED ORDER — FENTANYL CITRATE 0.05 MG/ML IJ SOLN
INTRAMUSCULAR | Status: AC
  Filled 2014-07-31: qty 4

## 2014-07-31 MED ORDER — FLUMAZENIL 0.5 MG/5ML IV SOLN
INTRAVENOUS | Status: AC
  Filled 2014-07-31: qty 5

## 2014-07-31 MED ORDER — BUPIVACAINE HCL (PF) 0.25 % IJ SOLN
INTRAMUSCULAR | Status: AC
  Filled 2014-07-31: qty 30

## 2014-07-31 MED ORDER — FENTANYL CITRATE 0.05 MG/ML IJ SOLN
INTRAMUSCULAR | Status: AC | PRN
  Administered 2014-07-31 (×6): 25 ug via INTRAVENOUS

## 2014-07-31 MED ORDER — MIDAZOLAM HCL 2 MG/2ML IJ SOLN
INTRAMUSCULAR | Status: AC
  Filled 2014-07-31: qty 4

## 2014-07-31 MED ORDER — NALOXONE HCL 0.4 MG/ML IJ SOLN
INTRAMUSCULAR | Status: AC
  Filled 2014-07-31: qty 1

## 2014-07-31 MED ORDER — CEFAZOLIN SODIUM-DEXTROSE 2-3 GM-% IV SOLR
INTRAVENOUS | Status: AC
  Filled 2014-07-31: qty 50

## 2014-07-31 MED ORDER — HYDROMORPHONE HCL 1 MG/ML IJ SOLN
INTRAMUSCULAR | Status: AC
  Filled 2014-07-31: qty 3

## 2014-07-31 MED ORDER — HYDROMORPHONE HCL 1 MG/ML IJ SOLN
INTRAMUSCULAR | Status: AC | PRN
  Administered 2014-07-31: 1 mg via INTRAVENOUS

## 2014-07-31 NOTE — Sedation Documentation (Signed)
Patient denies pain and is resting comfortably.  

## 2014-07-31 NOTE — Procedures (Signed)
S/P T 12 KP with biopsy

## 2014-07-31 NOTE — Discharge Instructions (Signed)
Percutaneous Vertebroplasty, Care After Refer to this sheet in the next few weeks. These instructions provide you with information on caring for yourself after your procedure. Your health care provider may also give you more specific instructions. Your treatment has been planned according to current medical practices, but problems sometimes occur. Call your health care provider if you have any problems or questions after your procedure. WHAT TO EXPECT AFTER THE PROCEDURE  You may have discomfort in your back at the site of the procedure.  You may have immediate relief of the back pain from the collapsed bones of the spine (compression fracture), or it may lessen over the next 2 days.  Pain resulting from a percutaneous vertebroplasty will usually go away in 2-3 days. HOME CARE INSTRUCTIONS  Take pain medicine as directed by your health care provider.   Keep the incision site dry and covered for 24 hours, or as told by your health care provider. Ask your health care provider when you may remove your bandage, as well as when you can bathe or shower.   An ice pack can be placed on the incision site to help lessen pain and swelling after the procedure:   Put ice in a plastic bag.   Place a towel between your skin and the bag.   Leave the ice on for 20 minutes, 2-3 times a day.  Rest for 24 hours after the procedure or as told by your health care provider.  Return to normal activity as told by your health care provider.  Ask what type of stretching and strengthening exercises you should do after the procedure.  Do not bend or lift anything heavy. Follow your health care provider's instructions about bending and lifting. SEEK MEDICAL CARE IF:  Your incision site becomes red, swollen, or tender to the touch.   You have any type of bleeding or fluid leakage from the incision site.   You become nauseous or vomit for more than 24 hours after the procedure.   Your back pain does  not get better.   You have a fever. SEEK IMMEDIATE MEDICAL CARE IF:   You have sudden, severe back pain.   You notice a change in your ability to control urination or bowel movements.   You have tingling, numbness, or weakness in your legs or feet after the procedure that was not there before.  You have sudden weakness in your arms or legs.  You have shooting pain down your legs.  You have chest pain, trouble breathing, or are short of breath.  You feel dizzy or faint.   You have changes in your speech or vision. Document Released: 02/18/2011 Document Revised: 06/27/2013 Document Reviewed: 02/27/2013 [facility] Patient Information 2015 [place], [place]. This information is not intended to replace advice given to you by your health care provider. Make sure you discuss any questions you have with your health care provider.  USE WALKER FOR 2 WEEKS. NO BENDING OR STOOPING FOR 3-4 WEEKS.

## 2014-07-31 NOTE — H&P (Signed)
Chief Complaint: Back pain  Referring Physician(s): Tykwon Fera K  History of Present Illness: Natasha Jackson is a 81 y.o. female   Pt was changing linens and heard pop in back 2 weeks ago today Has had increasing pain since then Hydrocodone helps but causes nausea and constipation Ranks pain 9-10 on scale 10 Pain located mid to low back Xrays show fx Thoracic 12; no fx Lumbar or cervical spine MRI confirms acute fx T 12 Now scheduled for Veterbroplasty/kyphoplasty Hx breast ca and multiple myeloma   Past Medical History  Diagnosis Date  . Breast cancer   . Multiple myeloma   . Aortic regurgitation   . HTN (hypertension)   . Hot flash not due to menopause 12/23/2011  . Neuropathy 12/23/2011  . DCIS (ductal carcinoma in situ) 08/04/2012    Past Surgical History  Procedure Laterality Date  . Lymph node excision - left groin    . Incisional hernia repair      Allergies: Review of patient's allergies indicates no known allergies.  Medications: Prior to Admission medications   Medication Sig Start Date End Date Taking? Authorizing Provider  acetaminophen (TYLENOL) 325 MG tablet Take 650 mg by mouth every 6 (six) hours as needed (Pain).    Yes Historical Provider, MD  aspirin 325 MG EC tablet Take 325 mg by mouth daily.   Yes Historical Provider, MD  cholecalciferol (VITAMIN D) 1000 UNITS tablet Take 1,000 Units by mouth daily. 05/01/13  Yes Heath Lark, MD  diphenhydramine-acetaminophen (TYLENOL PM) 25-500 MG TABS Take 1 tablet by mouth at bedtime as needed (Sleep).    Yes Historical Provider, MD  gabapentin (NEURONTIN) 300 MG capsule Take 1 capsule (300 mg total) by mouth 3 (three) times daily. Patient taking differently: Take 300 mg by mouth at bedtime.  10/13/13  Yes Heath Lark, MD  HYDROmorphone (DILAUDID) 4 MG tablet Take 1 tablet (4 mg total) by mouth every 4 (four) hours as needed for severe pain. 07/18/14  Yes Heath Lark, MD  lenalidomide (REVLIMID) 10 MG  capsule Take 1 capsule (10 mg total) by mouth daily. Patient taking differently: Take 10 mg by mouth daily. On for 21 days. Off for 7 days 07/19/14  Yes Heath Lark, MD  levothyroxine (SYNTHROID, LEVOTHROID) 75 MCG tablet Take 75 mcg by mouth daily before breakfast.   Yes Historical Provider, MD  lidocaine-prilocaine (EMLA) cream Apply 1 application topically as needed. Apply to [facility] a cath site at least one hour prior to [employer]. 06/21/14  Yes Heath Lark, MD  loperamide (IMODIUM A-D) 2 MG tablet Take 2 mg by mouth as needed for diarrhea or loose stools (as directed.).   Yes Historical Provider, MD  meloxicam (MOBIC) 7.5 MG tablet Take 7.5 mg by mouth 2 (two) times daily as needed for pain.  05/06/11  Yes Historical Provider, MD  valsartan (DIOVAN) 160 MG tablet Take 80 mg by mouth at bedtime.    Yes Historical Provider, MD  valsartan (DIOVAN) 160 MG tablet TAKE ONE TABLET BY MOUTH ONE TIME DAILY  07/18/14  Yes Josue Hector, MD  traMADol (ULTRAM) 50 MG tablet Take 1 tablet (50 mg total) by mouth every 6 (six) hours as needed for severe pain. 06/18/14   Heath Lark, MD    Family History  Problem Relation Age of Onset  . Heart failure Mother   . Stroke Father     History   Social History  . Marital Status: Married    Spouse Name: N/A  Number of Children: N/A  . Years of Education: N/A   Social History Main Topics  . Smoking status: Never Smoker   . Smokeless tobacco: Never Used  . Alcohol Use: No  . Drug Use: No  . Sexual Activity: None   Other Topics Concern  . None   Social History Narrative    Review of Systems: A 12 point ROS discussed and pertinent positives are indicated in the HPI above.  All other systems are negative.  Review of Systems  Constitutional: Positive for activity change and appetite change. Negative for fever and fatigue.  Respiratory: Negative for chest tightness and shortness of breath.   Gastrointestinal: Negative for abdominal pain.    Genitourinary: Negative for difficulty urinating.  Musculoskeletal: Positive for back pain and gait problem. Negative for neck pain.  Neurological: Positive for weakness.  Psychiatric/Behavioral: Negative for behavioral problems and confusion.    Vital Signs: BP 143/81 mmHg  Pulse 63  Temp(Src) 98.3 F (36.8 C) (Oral)  Resp 18  Ht 5' 5"  (1.651 m)  Wt 58.514 kg (129 lb)  BMI 21.47 kg/m2  SpO2 98%  Physical Exam  Constitutional: She is oriented to person, place, and time. She appears well-nourished.  Cardiovascular: Normal rate, regular rhythm and normal heart sounds.   No murmur heard. Pulmonary/Chest: Effort normal and breath sounds normal. She has no wheezes.  Abdominal: Soft. Bowel sounds are normal. There is no tenderness.  Musculoskeletal: Normal range of motion. She exhibits tenderness.  Back pain  Neurological: She is alert and oriented to person, place, and time.  Skin: Skin is warm and dry.  Psychiatric: She has a normal mood and affect. Her behavior is normal. Judgment and thought content normal.  Nursing note and vitals reviewed.   Imaging: Dg Cervical Spine Complete  07/18/2014   CLINICAL DATA:  Low back pain, left neck pain after lifting mattress yesterday. History of multiple myeloma.  EXAM: CERVICAL SPINE  4+ VIEWS  COMPARISON:  None.  FINDINGS: Degenerative disc disease changes from C4-5 thru C6-7. Degenerative facet disease, left greater than right with slight anterolisthesis of C4 on C5. No fracture. Mild bilateral neural foraminal narrowing at C5-6 due to uncovertebral spurring and facet disease. Prevertebral soft tissues are normal.  IMPRESSION: Degenerative disc and facet disease as above. No acute bony abnormality.   Electronically Signed   By: Rolm Baptise M.D.   On: 07/18/2014 11:41   Dg Thoracic Spine 2 View  07/18/2014   CLINICAL DATA:  Severe back pain. Fall yesterday. History of multiple myeloma.  EXAM: THORACIC SPINE - 2 VIEW  COMPARISON:  Bone survey  02/05/2014  FINDINGS: Right jugular Port-A-Cath is noted. Mild thoracic levoscoliosis is noted. There is a new, mild T12 superior endplate compression fracture. There is no listhesis although the cervicothoracic junction is partially obscured by overlapping shoulder tissues. Mild multilevel thoracic endplate osteophytosis is noted. No lytic or blastic osseous lesion is identified. Thoracic aortic calcification is noted. Right upper quadrant abdominal surgical clips.  IMPRESSION: New, mild T12 compression fracture.   Electronically Signed   By: Logan Bores   On: 07/18/2014 11:30   Dg Lumbar Spine Complete  07/18/2014   CLINICAL DATA:  Low back pain since an injury 07/17/2014 after lifting injury. History of multiple myeloma.  EXAM: LUMBAR SPINE - COMPLETE 4+ VIEW  COMPARISON:  Osseous survey 04/19/2013.  FINDINGS: No fracture is identified. Trace anterolisthesis L4 on L5 is noted. Intervertebral disc space height is maintained. Aortic atherosclerosis is noted.  IMPRESSION: Negative for fracture. No acute finding. Stable compared to prior exam.   Electronically Signed   By: Inge Rise M.D.   On: 07/18/2014 11:37   Mr Thoracic Spine Wo Contrast  07/24/2014   CLINICAL DATA:  Increasing back pain for 1 week after lifting injury. Multiple myeloma.  EXAM: MRI THORACIC SPINE WITHOUT CONTRAST  TECHNIQUE: Multiplanar, multisequence MR imaging of the thoracic spine was performed. No intravenous contrast was administered.  COMPARISON:  Radiographs dated 07/18/2014  FINDINGS: There is a subacute compression fracture of the superior endplate of F81. There is minimal protrusion of the posterior superior aspect of the T12 vertebra into the spinal canal with no neural impingement. This extends approximately 3 mm. No disc protrusion or bulging. The appearance is typical for benign osteoporotic compression fracture. There is a 5 mm lesion in the anterior left central aspect of T2 and an 8 mm lesion in the central left  aspect of T6. These lesions are indeterminate in etiology. They are not appreciable on the prior CT scan of the chest dated 06/08/2005. They are not typical for hemangiomas.  There is scarring in both lung apices in there are tiny bilateral pleural effusions.  The thoracic spinal cord appears normal. No foraminal or spinal stenosis.  IMPRESSION: 1. Slight benign appearing compression fracture of T12, subacute. 2. Small lesions in T2 and T6 which are indeterminate in etiology.   Electronically Signed   By: Rozetta Nunnery M.D.   On: 07/24/2014 16:23    Labs:  CBC:  Recent Labs  07/18/14 1112 07/25/14 1317 07/26/14 1026 07/31/14 0807  WBC 6.2 2.1* 3.0* 3.3*  HGB 10.8* 10.3* 10.1* 10.1*  HCT 31.9* 30.7* 31.4* 30.0*  PLT 207 243 248 294    COAGS:  Recent Labs  06/26/14 1250 07/31/14 0807  INR 0.97 1.02  APTT  --  30    BMP:  Recent Labs  07/18/14 1112 07/25/14 1317 07/26/14 1026 07/31/14 0807  NA 132* 132* 134* 136  K 4.2 3.7 4.1 3.8  CL  --  99  --  104  CO2 24 25 26 24   GLUCOSE 97 82 84 91  BUN 14.9 11 9.4 8  CALCIUM 8.8 8.0* 7.9* 8.1*  CREATININE 0.7 0.56 0.7 0.66  GFRNONAA  --  82*  --  78*  GFRAA  --  >90  --  >90    LIVER FUNCTION TESTS:  Recent Labs  07/04/14 1401 07/18/14 1112 07/25/14 1317 07/26/14 1026  BILITOT 0.40 0.67 0.8 0.43  AST 24 17 17 15   ALT 17 12 14 9   ALKPHOS 66 63 56 60  PROT 7.1 6.7 6.4 6.7  ALBUMIN 3.6 3.1* 3.4* 3.2*    TUMOR MARKERS: No results for input(s): AFPTM, CEA, CA199, CHROMGRNA in the last 8760 hours.  Assessment and Plan:  New back pain - worsening since injury 2 weeks ago Thoracic 12 fx confirmed on MRI Scheduled for T12 VP/KP and bx Pt and family aware of procedure benefits and risks including but not limited to: Infection; bleeding; bone damage Agreeable to proceed Consent signed and in chart  Thank you for this interesting consult.  I greatly enjoyed meeting Natasha Jackson and look forward to participating in  their care.  Signed: TURPIN,PAMELA A 07/31/2014, 9:14 AM   I spent a total of 20 minutes face to face in clinical consultation, greater than 50% of which was counseling/coordinating care for T12 VP/KP

## 2014-08-01 ENCOUNTER — Encounter (HOSPITAL_COMMUNITY): Payer: Self-pay | Admitting: Emergency Medicine

## 2014-08-01 ENCOUNTER — Inpatient Hospital Stay (HOSPITAL_COMMUNITY): Payer: Medicare Other

## 2014-08-01 ENCOUNTER — Telehealth: Payer: Self-pay | Admitting: Nurse Practitioner

## 2014-08-01 ENCOUNTER — Emergency Department (HOSPITAL_COMMUNITY): Payer: Medicare Other

## 2014-08-01 ENCOUNTER — Telehealth: Payer: Self-pay | Admitting: *Deleted

## 2014-08-01 ENCOUNTER — Inpatient Hospital Stay (HOSPITAL_COMMUNITY)
Admission: EM | Admit: 2014-08-01 | Discharge: 2014-08-05 | DRG: 981 | Disposition: A | Discharge status: Home Health | Payer: Medicare Other | Attending: Internal Medicine | Admitting: Internal Medicine

## 2014-08-01 DIAGNOSIS — Z853 Personal history of malignant neoplasm of breast: Secondary | ICD-10-CM | POA: Diagnosis not present

## 2014-08-01 DIAGNOSIS — E43 Unspecified severe protein-calorie malnutrition: Secondary | ICD-10-CM | POA: Diagnosis present

## 2014-08-01 DIAGNOSIS — T451X5A Adverse effect of antineoplastic and immunosuppressive drugs, initial encounter: Secondary | ICD-10-CM | POA: Diagnosis present

## 2014-08-01 DIAGNOSIS — C9000 Multiple myeloma not having achieved remission: Secondary | ICD-10-CM | POA: Diagnosis present

## 2014-08-01 DIAGNOSIS — E876 Hypokalemia: Secondary | ICD-10-CM | POA: Diagnosis present

## 2014-08-01 DIAGNOSIS — R197 Diarrhea, unspecified: Secondary | ICD-10-CM | POA: Diagnosis present

## 2014-08-01 DIAGNOSIS — K5909 Other constipation: Secondary | ICD-10-CM

## 2014-08-01 DIAGNOSIS — D630 Anemia in neoplastic disease: Secondary | ICD-10-CM | POA: Diagnosis present

## 2014-08-01 DIAGNOSIS — Z7982 Long term (current) use of aspirin: Secondary | ICD-10-CM | POA: Diagnosis not present

## 2014-08-01 DIAGNOSIS — Z9181 History of falling: Secondary | ICD-10-CM

## 2014-08-01 DIAGNOSIS — D72819 Decreased white blood cell count, unspecified: Secondary | ICD-10-CM | POA: Diagnosis present

## 2014-08-01 DIAGNOSIS — M129 Arthropathy, unspecified: Secondary | ICD-10-CM

## 2014-08-01 DIAGNOSIS — M4850XS Collapsed vertebra, not elsewhere classified, site unspecified, sequela of fracture: Secondary | ICD-10-CM

## 2014-08-01 DIAGNOSIS — M8088XA Other osteoporosis with current pathological fracture, vertebra(e), initial encounter for fracture: Secondary | ICD-10-CM | POA: Diagnosis present

## 2014-08-01 DIAGNOSIS — E860 Dehydration: Secondary | ICD-10-CM | POA: Diagnosis present

## 2014-08-01 DIAGNOSIS — R0602 Shortness of breath: Secondary | ICD-10-CM

## 2014-08-01 DIAGNOSIS — I10 Essential (primary) hypertension: Secondary | ICD-10-CM | POA: Diagnosis present

## 2014-08-01 DIAGNOSIS — Z79899 Other long term (current) drug therapy: Secondary | ICD-10-CM

## 2014-08-01 DIAGNOSIS — IMO0001 Reserved for inherently not codable concepts without codable children: Secondary | ICD-10-CM

## 2014-08-01 LAB — URINALYSIS, ROUTINE W REFLEX MICROSCOPIC
Bilirubin Urine: NEGATIVE
Glucose, UA: NEGATIVE mg/dL
Ketones, ur: 40 mg/dL — AB
LEUKOCYTES UA: NEGATIVE
NITRITE: NEGATIVE
PH: 6.5 (ref 5.0–8.0)
Protein, ur: NEGATIVE mg/dL
SPECIFIC GRAVITY, URINE: 1.009 (ref 1.005–1.030)
Urobilinogen, UA: 0.2 mg/dL (ref 0.0–1.0)

## 2014-08-01 LAB — CBC WITH DIFFERENTIAL/PLATELET
Basophils Absolute: 0 10*3/uL (ref 0.0–0.1)
Basophils Relative: 1 % (ref 0–1)
Eosinophils Absolute: 0 10*3/uL (ref 0.0–0.7)
Eosinophils Relative: 0 % (ref 0–5)
HCT: 30.7 % — ABNORMAL LOW (ref 36.0–46.0)
Hemoglobin: 10.2 g/dL — ABNORMAL LOW (ref 12.0–15.0)
LYMPHS ABS: 0.4 10*3/uL — AB (ref 0.7–4.0)
Lymphocytes Relative: 13 % (ref 12–46)
MCH: 32.9 pg (ref 26.0–34.0)
MCHC: 33.2 g/dL (ref 30.0–36.0)
MCV: 99 fL (ref 78.0–100.0)
MONO ABS: 0.1 10*3/uL (ref 0.1–1.0)
Monocytes Relative: 5 % (ref 3–12)
Neutro Abs: 2.1 10*3/uL (ref 1.7–7.7)
Neutrophils Relative %: 81 % — ABNORMAL HIGH (ref 43–77)
Platelets: 277 10*3/uL (ref 150–400)
RBC: 3.1 MIL/uL — ABNORMAL LOW (ref 3.87–5.11)
RDW: 13.9 % (ref 11.5–15.5)
WBC: 2.6 10*3/uL — ABNORMAL LOW (ref 4.0–10.5)

## 2014-08-01 LAB — COMPREHENSIVE METABOLIC PANEL
ALK PHOS: 66 U/L (ref 39–117)
ALT: 15 U/L (ref 0–35)
AST: 23 U/L (ref 0–37)
Albumin: 3.1 g/dL — ABNORMAL LOW (ref 3.5–5.2)
Anion gap: 9 (ref 5–15)
BUN: 5 mg/dL — ABNORMAL LOW (ref 6–23)
CO2: 24 mmol/L (ref 19–32)
Calcium: 8 mg/dL — ABNORMAL LOW (ref 8.4–10.5)
Chloride: 103 mmol/L (ref 96–112)
Creatinine, Ser: 0.6 mg/dL (ref 0.50–1.10)
GFR calc Af Amer: >90 mL/min (ref >90)
GFR calc non Af Amer: 80 mL/min — ABNORMAL LOW (ref >90)
GLUCOSE: 100 mg/dL — AB (ref 70–99)
Potassium: 3.3 mmol/L — ABNORMAL LOW (ref 3.5–5.1)
Sodium: 136 mmol/L (ref 135–145)
Total Bilirubin: 0.8 mg/dL (ref 0.3–1.2)
Total Protein: 6.4 g/dL (ref 6.0–8.3)

## 2014-08-01 LAB — CBC
HEMATOCRIT: 29.2 % — AB (ref 36.0–46.0)
HEMOGLOBIN: 10 g/dL — AB (ref 12.0–15.0)
MCH: 33.6 pg (ref 26.0–34.0)
MCHC: 34.2 g/dL (ref 30.0–36.0)
MCV: 98 fL (ref 78.0–100.0)
Platelets: 263 10*3/uL (ref 150–400)
RBC: 2.98 MIL/uL — ABNORMAL LOW (ref 3.87–5.11)
RDW: 13.8 % (ref 11.5–15.5)
WBC: 2.6 10*3/uL — ABNORMAL LOW (ref 4.0–10.5)

## 2014-08-01 LAB — CREATININE, SERUM
CREATININE: 0.54 mg/dL (ref 0.50–1.10)
GFR calc Af Amer: >90 mL/min (ref >90)
GFR calc non Af Amer: 83 mL/min — ABNORMAL LOW (ref >90)

## 2014-08-01 LAB — TSH: TSH: 0.711 u[IU]/mL (ref 0.350–4.500)

## 2014-08-01 LAB — MAGNESIUM: Magnesium: 1.8 mg/dL (ref 1.5–2.5)

## 2014-08-01 LAB — LIPASE, BLOOD: LIPASE: 21 U/L (ref 11–59)

## 2014-08-01 LAB — TROPONIN I

## 2014-08-01 LAB — URINE MICROSCOPIC-ADD ON

## 2014-08-01 LAB — I-STAT CG4 LACTIC ACID, ED: Lactic Acid, Venous: 1 mmol/L (ref 0.5–2.0)

## 2014-08-01 MED ORDER — SODIUM CHLORIDE 0.9 % IJ SOLN
3.0000 mL | Freq: Two times a day (BID) | INTRAMUSCULAR | Status: DC
  Administered 2014-08-03 – 2014-08-04 (×2): 3 mL via INTRAVENOUS

## 2014-08-01 MED ORDER — SODIUM CHLORIDE 0.9 % IV BOLUS (SEPSIS)
1000.0000 mL | Freq: Once | INTRAVENOUS | Status: AC
  Administered 2014-08-01: 1000 mL via INTRAVENOUS

## 2014-08-01 MED ORDER — ENOXAPARIN SODIUM 40 MG/0.4ML ~~LOC~~ SOLN
40.0000 mg | SUBCUTANEOUS | Status: DC
  Administered 2014-08-01 – 2014-08-04 (×4): 40 mg via SUBCUTANEOUS
  Filled 2014-08-01 (×5): qty 0.4

## 2014-08-01 MED ORDER — ACETAMINOPHEN 650 MG RE SUPP: 650.0000 mg | Freq: Four times a day (QID) | RECTAL | Status: DC | PRN

## 2014-08-01 MED ORDER — ACETAMINOPHEN 325 MG PO TABS: 650.0000 mg | ORAL_TABLET | Freq: Four times a day (QID) | ORAL | Status: DC | PRN

## 2014-08-01 MED ORDER — ONDANSETRON HCL 4 MG/2ML IJ SOLN
4.0000 mg | Freq: Four times a day (QID) | INTRAMUSCULAR | Status: DC | PRN
  Administered 2014-08-01: 4 mg via INTRAVENOUS
  Filled 2014-08-01: qty 2

## 2014-08-01 MED ORDER — GABAPENTIN 300 MG PO CAPS
300.0000 mg | ORAL_CAPSULE | Freq: Three times a day (TID) | ORAL | Status: DC
  Administered 2014-08-01 – 2014-08-05 (×11): 300 mg via ORAL
  Filled 2014-08-01 (×12): qty 1

## 2014-08-01 MED ORDER — ASPIRIN EC 325 MG PO TBEC
325.0000 mg | DELAYED_RELEASE_TABLET | Freq: Every day | ORAL | Status: DC
  Administered 2014-08-02 – 2014-08-05 (×4): 325 mg via ORAL
  Filled 2014-08-01 (×5): qty 1

## 2014-08-01 MED ORDER — LEVOTHYROXINE SODIUM 50 MCG PO TABS
50.0000 ug | ORAL_TABLET | Freq: Every day | ORAL | Status: DC
  Administered 2014-08-02 – 2014-08-05 (×4): 50 ug via ORAL
  Filled 2014-08-01 (×4): qty 1

## 2014-08-01 MED ORDER — ACETAMINOPHEN 325 MG PO TABS
650.0000 mg | ORAL_TABLET | Freq: Four times a day (QID) | ORAL | Status: DC | PRN
  Administered 2014-08-01: 650 mg via ORAL
  Filled 2014-08-01: qty 2

## 2014-08-01 MED ORDER — LEVALBUTEROL HCL 0.63 MG/3ML IN NEBU: 0.6300 mg | INHALATION_SOLUTION | Freq: Four times a day (QID) | RESPIRATORY_TRACT | Status: DC | PRN

## 2014-08-01 MED ORDER — OXYCODONE HCL 5 MG PO TABS
5.0000 mg | ORAL_TABLET | Freq: Four times a day (QID) | ORAL | Status: DC | PRN
  Administered 2014-08-02 – 2014-08-03 (×2): 5 mg via ORAL
  Filled 2014-08-01 (×3): qty 1

## 2014-08-01 MED ORDER — POTASSIUM CHLORIDE IN NACL 20-0.9 MEQ/L-% IV SOLN
INTRAVENOUS | Status: DC
  Administered 2014-08-01 – 2014-08-02 (×2): via INTRAVENOUS
  Filled 2014-08-01 (×4): qty 1000

## 2014-08-01 MED ORDER — TRAMADOL HCL 50 MG PO TABS
50.0000 mg | ORAL_TABLET | Freq: Four times a day (QID) | ORAL | Status: DC | PRN
  Administered 2014-08-01 – 2014-08-02 (×2): 50 mg via ORAL
  Filled 2014-08-01 (×2): qty 1

## 2014-08-01 MED ORDER — ZOLPIDEM TARTRATE 5 MG PO TABS
2.5000 mg | ORAL_TABLET | Freq: Once | ORAL | Status: AC
  Administered 2014-08-02: 2.5 mg via ORAL
  Filled 2014-08-01: qty 1

## 2014-08-01 MED ORDER — TRAMADOL HCL 50 MG PO TABS: 50.0000 mg | ORAL_TABLET | Freq: Four times a day (QID) | ORAL | Status: DC | PRN

## 2014-08-01 MED ORDER — HYDROMORPHONE HCL 2 MG PO TABS: 4.0000 mg | ORAL_TABLET | ORAL | Status: DC | PRN

## 2014-08-01 MED ORDER — ONDANSETRON HCL 4 MG/2ML IJ SOLN
4.0000 mg | Freq: Once | INTRAMUSCULAR | Status: AC
  Administered 2014-08-01: 4 mg via INTRAVENOUS
  Filled 2014-08-01: qty 2

## 2014-08-01 MED ORDER — ONDANSETRON HCL 4 MG PO TABS: 4.0000 mg | ORAL_TABLET | Freq: Four times a day (QID) | ORAL | Status: DC | PRN

## 2014-08-01 NOTE — Progress Notes (Signed)
Discussed admission status with Dr. Allyson Sabal.

## 2014-08-01 NOTE — ED Provider Notes (Signed)
CSN: [id number]     Arrival date & time 08/01/14  1149 History   First MD Initiated Contact with Patient 08/01/14 1154     Chief Complaint  Patient presents with  . Diarrhea  . Nausea     (Consider location/radiation/quality/duration/timing/severity/associated sxs/prior Treatment) HPI Patient presents with concerns of ongoing weakness, nausea, diarrhea. Symptoms have been pronounced since yesterday, though present for at least one week. Patient has multiple medical issues, including multiple myeloma for which she is currently getting chemotherapy. 2 weeks ago the patient had a mechanical fall, suffering a T12 fracture. Yesterday patient had elective repair, kyphoplasty. Essentially since the procedure she has had worsening generalized discomfort, fatigue, as well as the aforementioned nausea, anorexia, diarrhea. Patient has been alternating narcotics and stool softeners for the past 2 weeks. She denies appreciable abdominal pain, no chest pain, no dyspnea, no confusion, no disorientation.  Past Medical History  Diagnosis Date  . Breast cancer   . Multiple myeloma   . Aortic regurgitation   . HTN (hypertension)   . Hot flash not due to menopause 12/23/2011  . Neuropathy 12/23/2011  . DCIS (ductal carcinoma in situ) 08/04/2012   Past Surgical History  Procedure Laterality Date  . Lymph node excision - left groin    . Incisional hernia repair     Family History  Problem Relation Age of Onset  . Heart failure Mother   . Stroke Father    History  Substance Use Topics  . Smoking status: Never Smoker   . Smokeless tobacco: Never Used  . Alcohol Use: No   OB History    No data available     Review of Systems  Constitutional:       Per HPI, otherwise negative  HENT:       Per HPI, otherwise negative  Respiratory:       Per HPI, otherwise negative  Cardiovascular:       Per HPI, otherwise negative  Gastrointestinal: Positive for nausea. Negative for vomiting.    Endocrine:       Negative aside from HPI  Genitourinary:       Neg aside from HPI   Musculoskeletal:       Per HPI, otherwise negative  Skin: Negative.   Neurological: Positive for weakness. Negative for syncope.      Allergies  Review of patient's allergies indicates no known allergies.  Home Medications   Prior to Admission medications   Medication Sig Start Date End Date Taking? Authorizing Provider  acetaminophen (TYLENOL) 325 MG tablet Take 650 mg by mouth every 6 (six) hours as needed (Pain).    Yes Historical Provider, MD  aspirin 325 MG EC tablet Take 325 mg by mouth daily.   Yes Historical Provider, MD  cholecalciferol (VITAMIN D) 1000 UNITS tablet Take 1,000 Units by mouth daily. 05/01/13  Yes Heath Lark, MD  diphenhydramine-acetaminophen (TYLENOL PM) 25-500 MG TABS Take 1 tablet by mouth at bedtime as needed (Sleep).    Yes Historical Provider, MD  gabapentin (NEURONTIN) 300 MG capsule Take 1 capsule (300 mg total) by mouth 3 (three) times daily. Patient taking differently: Take 300 mg by mouth at bedtime.  10/13/13  Yes Heath Lark, MD  HYDROmorphone (DILAUDID) 4 MG tablet Take 1 tablet (4 mg total) by mouth every 4 (four) hours as needed for severe pain. 07/18/14  Yes Heath Lark, MD  lenalidomide (REVLIMID) 10 MG capsule Take 1 capsule (10 mg total) by mouth daily. Patient taking differently: Take 10  mg by mouth daily. On for 21 days. Off for 7 days 07/19/14  Yes Heath Lark, MD  levothyroxine (SYNTHROID, LEVOTHROID) 50 MCG tablet Take 50 mcg by mouth daily before breakfast.   Yes Historical Provider, MD  lidocaine-prilocaine (EMLA) cream Apply 1 application topically as needed. Apply to [facility] a cath site at least one hour prior to [employer]. 06/21/14  Yes Heath Lark, MD  loperamide (IMODIUM A-D) 2 MG tablet Take 2 mg by mouth as needed for diarrhea or loose stools (as directed.).   Yes Historical Provider, MD  meloxicam (MOBIC) 7.5 MG tablet Take 7.5 mg by mouth 2 (two)  times daily as needed for pain.  05/06/11  Yes Historical Provider, MD  PRESCRIPTION MEDICATION Chemo at [facility]   Yes Historical Provider, MD  traMADol (ULTRAM) 50 MG tablet Take 1 tablet (50 mg total) by mouth every 6 (six) hours as needed for severe pain. 06/18/14  Yes Heath Lark, MD  valsartan (DIOVAN) 160 MG tablet TAKE ONE TABLET BY MOUTH ONE TIME DAILY  07/18/14  Yes Josue Hector, MD   BP 156/61 mmHg  Pulse 71  Temp(Src) 98.3 F (36.8 C) (Oral)  Resp 16  SpO2 96% Physical Exam  Constitutional: She is oriented to person, place, and time. She has a sickly appearance.  Ill-appearing elderly female  HENT:  Head: Normocephalic and atraumatic.  Eyes: Conjunctivae and EOM are normal.  Cardiovascular: Normal rate and regular rhythm.   Pulmonary/Chest: Effort normal and breath sounds normal. No stridor. No respiratory distress.  Abdominal: She exhibits no distension.  Musculoskeletal: She exhibits no edema.       Arms: Neurological: She is alert and oriented to person, place, and time. She displays atrophy. She displays no tremor. No cranial nerve deficit or sensory deficit. She exhibits abnormal muscle tone. She displays no seizure activity. Coordination normal.  Skin: Skin is warm and dry.  Psychiatric: She has a normal mood and affect.  Nursing note and vitals reviewed.   ED Course  Procedures (including critical care time) Labs Review Labs Reviewed  CBC WITH DIFFERENTIAL/PLATELET - Abnormal; Notable for the following:    WBC 2.6 (*)    RBC 3.10 (*)    Hemoglobin 10.2 (*)    HCT 30.7 (*)    Neutrophils Relative % 81 (*)    Lymphs Abs 0.4 (*)    All other components within normal limits  COMPREHENSIVE METABOLIC PANEL  MAGNESIUM  I-STAT CG4 LACTIC ACID, ED    Imaging Review Ir Kypho Vertebral Thoracic Augmentation  07/31/2014   CLINICAL DATA:  Severe thoracolumbar pain secondary to compression fracture at T12.  EXAM: KYPHOPLASTY AT T12  PROCEDURE: Following a full  explanation of the procedure along with the potentially associated complications, an informed witnessed consent was obtained.  The patient was placed prone on the fluoroscopic table. The skin overlying the thoracolumbar region was then prepped and draped in the usual sterile fashion. Both pedicles at T12 were then infiltrated with 0.25% bupivacaine followed by the advancement of an 11-gauge Jamshidi needle through both pedicles into the posterior one-third at T12. This was then exchanged for a Kyphon advanced osteo introducer system comprised of a working cannula and a Kyphon osteo drill.  This combination was then advanced over a Kyphon osteo bone pin until the tip of the Kyphon osteo drill was in the posterior third at T12 bilaterally.  At this time, the bone pin was removed. In a medial trajectory, the combination was advanced until the  tip of the working cannula was inside the posterior one-third bilaterally at T2.  The osteo drill was removed and a core sample sent for pathologic analysis.  Through the working cannula, a Kyphon bone biopsy device was advanced to within 5 mm of the anterior aspect of T12. A core sample from this was also sent for pathologic analysis.  Through the working cannula, a Kyphon inflatable bone tamp 15 x 3 was advanced and positioned with the distal marker 5 mm from the anterior aspect of T2 through both pedicles. Crossing of the midline was seen on the AP projection. At this time, the balloons were expanded using contrast via a Kyphon inflation syringe device via microtubing.  Inflations were continued until there was apposition with the superior and the inferior endplates bilaterally.  At this time, methylmethacrylate mixture was reconstituted with Tobramycin in the Kyphon bone mixing device system. This was then loaded onto the Kyphon bone fillers.  The balloons were deflated and removed followed by the instillation of 2 bone filler equivalents of methylmethacrylate mixture at each  pedicle with excellent filling in the AP and lateral projections. No extravasation was noted in the disk spaces or posteriorly into the spinal canal. No epidural venous contamination was seen.  The patient tolerated the procedure well. There were no acute complications. The working cannula and the bone fillers were then retrieved and removed.  Medications utilized: Versed 3.5 mg IV and Fentanyl 150 micrograms IV. Dilaudid 1 mg IV.  Total Moderate Sedation Time:  30 minutes.  IMPRESSION: 1. Status post fluoroscopic-guided needle placement for deep core bone biopsy at T12  2. Status post vertebral body augmentation using balloon kyphoplasty at T12 as described without event.   Electronically Signed   By: Luanne Bras M.D.   On: 07/31/2014 13:22   I reviewed the patient's chart, including emergency department evaluation one week ago.   With some suspicion for dehydration, the patient empirically received IVF.  Update: I discussed patient's case with her oncology nurse. We discussed that the patient was planned for direct admission, but wound up in the emergency department. We discussed the need for likely admission, and that the patient may require admission to the oncology team, Dr. Alvy Bimler admitting.  3:32 PM Patient has returned from another trip to the bathroom. She and her family members are aware of all results.  MDM   This patient with multiple myeloma, currently getting chemotherapy now presents with ongoing weakness, diarrhea, fatigue. Here the patient is found to have persistent diarrhea, by mouth intolerance, but has reassuring vital signs aside from mild hypoxia.  Labs are consistent with her prior studies, including leukopenia, anemia. Patient required admission for further evaluation and management.     Carmin Muskrat, MD 08/01/14 1534

## 2014-08-01 NOTE — Telephone Encounter (Signed)
Returned patient's daughter's call regarding home health request due to needing help caring for pt. Kenae reportedly had diarrhea all night after taking stool softeners with pain meds. Had back surgery yesterday and is now home and daughter "needs help." in addition, Shalane is her husband's caregiver and he will be discharged from hospital today and she cannot care for him. The patient is asking for rehab for her and her husband. Patient informed that Dr. Alvy Bimler will likely not be able to provide placement for her husband because she is not his physician. She verbalizes understanding. Dr. Alvy Bimler notified.  Per MD, we can offer patient home health which will provide aide approx 3 times per week. Alternatively, we can admit patient for pain and diarrhea control and social work can then work on placement in a rehab facility.   Patient and her daughter want to think about options and will call back.

## 2014-08-01 NOTE — ED Notes (Addendum)
Diarrhea started last night after getting home from back surgery. Pt complains of nausea, no vomiting. Complains of chills, body aches, and fatigue since surgery. From home, has received 4mg  Zofran and 146ml NS per EMS in route.

## 2014-08-01 NOTE — Telephone Encounter (Signed)
Messages retrieved at this time from Triage voicemail left by Lurlean Leyden at [phone] an 1120am.   First message "Lorre Nick called me to sit with Mom.  I am at home with her and Mom needs to be re-admitted S/P surgery to back on yesterday.  Has diarrhea, dehydration, T = 100.0.  Please set this up, we'll be there in an hour." Second message "We're en route via ambulance and will see you in a few minutes." Collaborative nurse inquiring about message after learning patient in ER.  At this time note patient eill be admitted.

## 2014-08-01 NOTE — ED Notes (Signed)
Pt transferred to 4th floor family aware of poc. Social work is aware of pt case

## 2014-08-01 NOTE — Telephone Encounter (Signed)
Rn called to check on patient status, spoke to April who notified me that Natasha Jackson went to the ED via ambulance earlier today and called our clinic to let us know. Dr. Alvy Bimler informed but out of office now. This RN spoke to Dr. Vanita Panda in ED to inform him of prior discussion with family regarding hospital admission in order to get rehab placement. Per Dr. Alvy Bimler, patient can be admitted under hospitalist care. Patient to be admitted from ED.

## 2014-08-01 NOTE — H&P (Signed)
[facility] History and Physical  Natasha Jackson KDT:[id number] DOB: 06-28-1926 DOA: 08/01/2014  Referring physician:  PCP: Thressa Sheller, MD  Patient Care Team: Josue Hector, MD as PCP - General (Cardiology) Thressa Sheller, MD (Internal Medicine) Renella Cunas, MD as Attending Physician (Cardiology) Heath Lark, MD as Consulting Physician (Hematology and Oncology  Chief Complaint:   Diarrhea  . Nausea      HPI:  81 year old female with a history of multiple myeloma, breast cancer, actively undergoing chemotherapy, last chemotherapy was on 1/21, presenting with nausea and diarrhea for the last 1-1/2 days, 4-5 loose stools, weakness, inability to go about her activities of daily living. The patient had a mechanical fall 2 weeks ago and had a T12 fracture, she status post Vertebroplasty 1/26. Patient has had problems with pain regimen including Dilaudid, oxycodone. Dilaudid caused her to get constipated for which the patient not to take stool softeners and miralax from which she ended up having diarrhea. Patient and family are not sure if the patient is having side effects from the chemotherapy. Over the weekend the patient also had a low-grade fever.  Patient afebrile in the ED, normotensive, blood work reveals leukopenia, hypokalemia, and she is clinically dehydrated. Chest x-ray negative   Review of Systems: negative for the following  HEENT: Denies photophobia, eye pain, redness, hearing loss, ear pain, congestion, sore throat, rhinorrhea, sneezing, mouth sores, trouble swallowing, neck pain, neck stiffness and tinnitus.  Constitutional: Positive for activity change and appetite change. Negative for fever and fatigue.  Respiratory: Negative for chest tightness and shortness of breath.  Gastrointestinal: Negative for abdominal pain.  Genitourinary: Negative for difficulty urinating.  Musculoskeletal: Positive for back pain and gait problem. Negative for neck pain.   Neurological: Positive for weakness.  Neurological: Denies syncope, weakness, light-headedness, numbness and headaches.  Hematological: Denies adenopathy. Easy bruising, personal or family bleeding history  Psychiatric/Behavioral: Denies suicidal ideation, mood changes, confusion, nervousness, sleep disturbance and agitation       Past Medical History  Diagnosis Date  . Breast cancer   . Multiple myeloma   . Aortic regurgitation   . HTN (hypertension)   . Hot flash not due to menopause 12/23/2011  . Neuropathy 12/23/2011  . DCIS (ductal carcinoma in situ) 08/04/2012     Past Surgical History  Procedure Laterality Date  . Lymph node excision - left groin    . Incisional hernia repair        Social History:  reports that she has never smoked. She has never used smokeless tobacco. She reports that she does not drink alcohol or use illicit drugs.   No Known Allergies  Family History  Problem Relation Age of Onset  . Heart failure Mother   . Stroke Father                       Prior to Admission medications   Medication Sig Start Date End Date Taking? Authorizing Provider  acetaminophen (TYLENOL) 325 MG tablet Take 650 mg by mouth every 6 (six) hours as needed (Pain).    Yes Historical Provider, MD  aspirin 325 MG EC tablet Take 325 mg by mouth daily.   Yes Historical Provider, MD  cholecalciferol (VITAMIN D) 1000 UNITS tablet Take 1,000 Units by mouth daily. 05/01/13  Yes Heath Lark, MD  diphenhydramine-acetaminophen (TYLENOL PM) 25-500 MG TABS Take 1 tablet by mouth at bedtime as needed (Sleep).    Yes Historical Provider, MD  gabapentin (NEURONTIN) 300 MG  capsule Take 1 capsule (300 mg total) by mouth 3 (three) times daily. Patient taking differently: Take 300 mg by mouth at bedtime.  10/13/13  Yes Heath Lark, MD  HYDROmorphone (DILAUDID) 4 MG tablet Take 1 tablet (4 mg total) by mouth every 4 (four) hours as needed for severe pain. 07/18/14  Yes Heath Lark, MD   lenalidomide (REVLIMID) 10 MG capsule Take 1 capsule (10 mg total) by mouth daily. Patient taking differently: Take 10 mg by mouth daily. On for 21 days. Off for 7 days 07/19/14  Yes Heath Lark, MD  levothyroxine (SYNTHROID, LEVOTHROID) 50 MCG tablet Take 50 mcg by mouth daily before breakfast.   Yes Historical Provider, MD  lidocaine-prilocaine (EMLA) cream Apply 1 application topically as needed. Apply to [facility] a cath site at least one hour prior to [employer]. 06/21/14  Yes Heath Lark, MD  loperamide (IMODIUM A-D) 2 MG tablet Take 2 mg by mouth as needed for diarrhea or loose stools (as directed.).   Yes Historical Provider, MD  meloxicam (MOBIC) 7.5 MG tablet Take 7.5 mg by mouth 2 (two) times daily as needed for pain.  05/06/11  Yes Historical Provider, MD  PRESCRIPTION MEDICATION Chemo at [facility]   Yes Historical Provider, MD  traMADol (ULTRAM) 50 MG tablet Take 1 tablet (50 mg total) by mouth every 6 (six) hours as needed for severe pain. 06/18/14  Yes Heath Lark, MD  valsartan (DIOVAN) 160 MG tablet TAKE ONE TABLET BY MOUTH ONE TIME DAILY  07/18/14  Yes Josue Hector, MD     Physical Exam: Filed Vitals:   08/01/14 1150 08/01/14 1157 08/01/14 1440  BP:  156/61 146/44  Pulse:  71 74  Temp:  98.3 F (36.8 C) 98.9 F (37.2 C)  TempSrc:  Oral Oral  Resp:  16 20  SpO2: 97% 96% 93%     Constitutional: Vital signs reviewed. Patient is a chronically ill-appearing and cachextic in no acute distress and cooperative with exam. Alert and oriented x3.  Head: Normocephalic and atraumatic  Ear: TM normal bilaterally  Mouth: no erythema or exudates, MMM  Eyes: PERRL, EOMI, conjunctivae normal, No scleral icterus.  Neck: Supple, Trachea midline normal ROM, No JVD, mass, thyromegaly, or carotid bruit present.  Cardiovascular: RRR, S1 normal, S2 normal, no MRG, pulses symmetric and intact bilaterally  Pulmonary/Chest: CTAB, no wheezes, rales, or rhonchi  Abdominal: Soft. Non-tender,  non-distended, bowel sounds are normal, no masses, organomegaly, or guarding present.  GU: no CVA tenderness Musculoskeletal: No joint deformities, erythema, or stiffness, ROM full and no nontender Ext: no edema and no cyanosis, pulses palpable bilaterally (DP and PT)  Hematology: no cervical, inginal, or axillary adenopathy.  Neurological: She is alert and oriented to person, place, and time. She displays atrophy. She displays no tremor. No cranial nerve deficit or sensory deficit. She exhibits abnormal muscle tone. She displays no seizure activity. Coordination normal.  Skin: Warm, dry and intact. No rash, cyanosis, or clubbing.  Psychiatric: Normal mood and affect. speech and behavior is normal. Judgment and thought content normal. Cognition and memory are normal.       Labs on Admission:    Basic Metabolic Panel:  Recent Labs Lab 07/26/14 1026 07/31/14 0807 08/01/14 1222  NA 134* 136 136  K 4.1 3.8 3.3*  CL  --  104 103  CO2 26 24 24   GLUCOSE 84 91 100*  BUN 9.4 8 5*  CREATININE 0.7 0.66 0.60  CALCIUM 7.9* 8.1* 8.0*  MG  --   --  1.8   Liver Function Tests:  Recent Labs Lab 07/26/14 1026 08/01/14 1222  AST 15 23  ALT 9 15  ALKPHOS 60 66  BILITOT 0.43 0.8  PROT 6.7 6.4  ALBUMIN 3.2* 3.1*   No results for input(s): LIPASE, AMYLASE in the last 168 hours. No results for input(s): AMMONIA in the last 168 hours. CBC:  Recent Labs Lab 07/26/14 1026 07/31/14 0807 08/01/14 1222  WBC 3.0* 3.3* 2.6*  NEUTROABS 1.9 2.3 2.1  HGB 10.1* 10.1* 10.2*  HCT 31.4* 30.0* 30.7*  MCV 100.4 96.8 99.0  PLT 248 294 277   Cardiac Enzymes: No results for input(s): CKTOTAL, CKMB, CKMBINDEX, TROPONINI in the last 168 hours.  BNP (last 3 results) No results for input(s): PROBNP in the last 8760 hours.    CBG: No results for input(s): GLUCAP in the last 168 hours.  Radiological Exams on Admission: Dg Chest 2 View  08/01/2014   CLINICAL DATA:  Diarrhea.  EXAM: CHEST  2  VIEW  COMPARISON:  July 18, 2014.  FINDINGS: Stable cardiomediastinal silhouette. No pneumothorax or pleural effusion is noted. Right internal jugular Port-A-Cath is noted with distal tip in expected position of the SVC. No acute pulmonary disease is noted. Status post kyphoplasty of lower thoracic vertebral body.  IMPRESSION: No acute cardiopulmonary abnormality seen.   Electronically Signed   By: Sabino Dick M.D.   On: 08/01/2014 14:15   Ir Kypho Vertebral Thoracic Augmentation  07/31/2014   CLINICAL DATA:  Severe thoracolumbar pain secondary to compression fracture at T12.  EXAM: KYPHOPLASTY AT T12  PROCEDURE: Following a full explanation of the procedure along with the potentially associated complications, an informed witnessed consent was obtained.  The patient was placed prone on the fluoroscopic table. The skin overlying the thoracolumbar region was then prepped and draped in the usual sterile fashion. Both pedicles at T12 were then infiltrated with 0.25% bupivacaine followed by the advancement of an 11-gauge Jamshidi needle through both pedicles into the posterior one-third at T12. This was then exchanged for a Kyphon advanced osteo introducer system comprised of a working cannula and a Kyphon osteo drill.  This combination was then advanced over a Kyphon osteo bone pin until the tip of the Kyphon osteo drill was in the posterior third at T12 bilaterally.  At this time, the bone pin was removed. In a medial trajectory, the combination was advanced until the tip of the working cannula was inside the posterior one-third bilaterally at T2.  The osteo drill was removed and a core sample sent for pathologic analysis.  Through the working cannula, a Kyphon bone biopsy device was advanced to within 5 mm of the anterior aspect of T12. A core sample from this was also sent for pathologic analysis.  Through the working cannula, a Kyphon inflatable bone tamp 15 x 3 was advanced and positioned with the distal  marker 5 mm from the anterior aspect of T2 through both pedicles. Crossing of the midline was seen on the AP projection. At this time, the balloons were expanded using contrast via a Kyphon inflation syringe device via microtubing.  Inflations were continued until there was apposition with the superior and the inferior endplates bilaterally.  At this time, methylmethacrylate mixture was reconstituted with Tobramycin in the Kyphon bone mixing device system. This was then loaded onto the Kyphon bone fillers.  The balloons were deflated and removed followed by the instillation of 2 bone filler equivalents of methylmethacrylate mixture at each pedicle with excellent filling  in the AP and lateral projections. No extravasation was noted in the disk spaces or posteriorly into the spinal canal. No epidural venous contamination was seen.  The patient tolerated the procedure well. There were no acute complications. The working cannula and the bone fillers were then retrieved and removed.  Medications utilized: Versed 3.5 mg IV and Fentanyl 150 micrograms IV. Dilaudid 1 mg IV.  Total Moderate Sedation Time:  30 minutes.  IMPRESSION: 1. Status post fluoroscopic-guided needle placement for deep core bone biopsy at T12  2. Status post vertebral body augmentation using balloon kyphoplasty at T12 as described without event.   Electronically Signed   By: Luanne Bras M.D.   On: 07/31/2014 13:22    EKG: Independently reviewed.   Assessment/Plan Active Problems:   Dehydration, moderate   weakness and dehydration Multifactorial secondary to recent chemotherapy, diarrhea resulting in hypokalemia, recent fall with T12 fracture Patient will receive aggressive IV hydration PT/OT evaluation  Hypokalemia Replete  Diarrhea Rule out C. difficile, GI pathogen panel Rule out impaction Abdominal KUB  Abdominal pain likely referred from the back Liver function normal Check lipase Patient only wants Tylenol for pain,  agreeable to trying tramadol if pain not controlled with Tylenol  Multiple myeloma  revlimid contributingto diarrhea ?  notify oncology   Anemia in neoplastic disease This is likely anemia of chronic disease hemoglobin has been very stable. . The patient denies recent history of bleeding such as epistaxis, hematuria or hematochezia. She is asymptomatic from the anemia. We will observe for now. She does not require transfusion now.    Vertebral compression fracture status post vertebroplasty Tylenol and tramadol for pain control  recently received zometa    Status:   full Family Communication: bedside Disposition Plan: admit   Time spent: 70 mins   [facility] Pager [phone]  If 7PM-7AM, please contact night-coverage [email or web address] Password [facility] 08/01/2014, 4:13 PM

## 2014-08-01 NOTE — Progress Notes (Signed)
ED CM consulted by ED RNs x 2 and CNA.  CM reviewed EPIC to see note from call from oncologist on 08/01/14 0914. "Per MD, we can offer patient home health which will provide aide approx 3 times per week. Alternatively, we can admit patient for pain and diarrhea control and social work can then work on placement in a rehab facility. "  CM spoke with pt, husband, daughter, and grand daughter about their concerns with pt care and husband care (dementia). Daughter not feeling she can care for both parents.  Preference is for both pt & her husband to go to "assisted living" together.  Pt states she does not feel she needs snf but reports unable to complete her own ADLs presently " I just need a few days to get better."  Daughter had been given a list of assisted living facilities and private duty nursing services to review but had not reviewed and discussed with pt or other family Cm reviewed facility bed search process and that providing preferences of facility can help SW.  Cm discussed that husband is no longer a pt and pcp can assist as needed for placement.  CM reviewed in details medicare guidelines, home health [facility]) (length of stay in home, types of [facility] staff available, coverage, primary caregiver, up to 24 hrs before services may be started), Private duty nursing (PDN-coverage, length of stay in the home types of staff available, cost generally $12-18/hr), assisted living (ASL- coverage, services offered, general cost per day & referred to Sw) and Skilled nursing facilities (snf- coverage and services offered general cost per day 7 referred to SW)  CM reviewed availability of HH SW to assist pcp to get pt to snf (if desired disposition) from the community level for pt husband. CM provided family with a list of [facility]. Preferred not to have snf lists.   Discussed pt to be further evaluated by unit therapists (PT/OT) for recommendation of level of care and share this with attending  MD and unit CM. Pt confirmed she nor husband was evaluated by hospital PT/OT for level of care "because I thought I would be okay"  Pt and daughter states on 07/31/14.pt "did not do well at home"  [place] daughter supportive.  Husband looked at daughter when Cm inquired if he needed anything and understood "I just do what she tells me" and smiled ( he was released from [facility] 08/01/14)   Discussed medicare admission status as observation and/or inpatient, 3 day qualifying inpatient stay need for placement or out of pocket expenses.

## 2014-08-01 NOTE — Progress Notes (Signed)
Utilization Review completed.  Loran Auguste RN CM  

## 2014-08-02 ENCOUNTER — Ambulatory Visit: Payer: Medicare Other

## 2014-08-02 ENCOUNTER — Other Ambulatory Visit: Payer: Medicare Other

## 2014-08-02 ENCOUNTER — Other Ambulatory Visit: Payer: Self-pay | Admitting: Hematology and Oncology

## 2014-08-02 ENCOUNTER — Ambulatory Visit: Payer: Medicare Other | Admitting: Hematology and Oncology

## 2014-08-02 DIAGNOSIS — R197 Diarrhea, unspecified: Secondary | ICD-10-CM

## 2014-08-02 DIAGNOSIS — D630 Anemia in neoplastic disease: Secondary | ICD-10-CM

## 2014-08-02 DIAGNOSIS — D72819 Decreased white blood cell count, unspecified: Secondary | ICD-10-CM

## 2014-08-02 DIAGNOSIS — C9000 Multiple myeloma not having achieved remission: Secondary | ICD-10-CM

## 2014-08-02 DIAGNOSIS — M545 Low back pain: Secondary | ICD-10-CM

## 2014-08-02 LAB — CBC
HCT: 27.6 % — ABNORMAL LOW (ref 36.0–46.0)
Hemoglobin: 9.3 g/dL — ABNORMAL LOW (ref 12.0–15.0)
MCH: 33.2 pg (ref 26.0–34.0)
MCHC: 33.7 g/dL (ref 30.0–36.0)
MCV: 98.6 fL (ref 78.0–100.0)
Platelets: 260 10*3/uL (ref 150–400)
RBC: 2.8 MIL/uL — ABNORMAL LOW (ref 3.87–5.11)
RDW: 13.9 % (ref 11.5–15.5)
WBC: 2.7 10*3/uL — AB (ref 4.0–10.5)

## 2014-08-02 LAB — COMPREHENSIVE METABOLIC PANEL
ALBUMIN: 2.9 g/dL — AB (ref 3.5–5.2)
ALT: 13 U/L (ref 0–35)
ANION GAP: 9 (ref 5–15)
AST: 19 U/L (ref 0–37)
Alkaline Phosphatase: 55 U/L (ref 39–117)
BUN: 7 mg/dL (ref 6–23)
CO2: 23 mmol/L (ref 19–32)
Calcium: 7.5 mg/dL — ABNORMAL LOW (ref 8.4–10.5)
Chloride: 105 mmol/L (ref 96–112)
Creatinine, Ser: 0.56 mg/dL (ref 0.50–1.10)
GFR calc non Af Amer: 82 mL/min — ABNORMAL LOW (ref >90)
Glucose, Bld: 71 mg/dL (ref 70–99)
POTASSIUM: 3.3 mmol/L — AB (ref 3.5–5.1)
SODIUM: 137 mmol/L (ref 135–145)
Total Bilirubin: 0.8 mg/dL (ref 0.3–1.2)
Total Protein: 5.7 g/dL — ABNORMAL LOW (ref 6.0–8.3)

## 2014-08-02 LAB — CLOSTRIDIUM DIFFICILE BY PCR: Toxigenic C. Difficile by PCR: NEGATIVE

## 2014-08-02 LAB — TROPONIN I

## 2014-08-02 MED ORDER — SODIUM CHLORIDE 0.9 % IV SOLN
INTRAVENOUS | Status: DC
  Administered 2014-08-02 – 2014-08-03 (×2): via INTRAVENOUS
  Filled 2014-08-02 (×4): qty 1000

## 2014-08-02 MED ORDER — ZOLPIDEM TARTRATE 5 MG PO TABS
2.5000 mg | ORAL_TABLET | Freq: Once | ORAL | Status: AC
  Administered 2014-08-02: 2.5 mg via ORAL
  Filled 2014-08-02: qty 1

## 2014-08-02 MED ORDER — LORAZEPAM 0.5 MG PO TABS
0.2500 mg | ORAL_TABLET | Freq: Once | ORAL | Status: AC
  Administered 2014-08-02: 0.25 mg via ORAL
  Filled 2014-08-02: qty 1

## 2014-08-02 MED ORDER — HYDROCODONE-ACETAMINOPHEN 5-325 MG PO TABS
1.0000 | ORAL_TABLET | ORAL | Status: DC | PRN
  Administered 2014-08-02 – 2014-08-05 (×7): 1 via ORAL
  Filled 2014-08-02 (×8): qty 1

## 2014-08-02 MED ORDER — SENNOSIDES-DOCUSATE SODIUM 8.6-50 MG PO TABS
1.0000 | ORAL_TABLET | Freq: Two times a day (BID) | ORAL | Status: DC
  Administered 2014-08-02 – 2014-08-03 (×3): 1 via ORAL
  Filled 2014-08-02 (×3): qty 1

## 2014-08-02 NOTE — Progress Notes (Signed)
CSW met with patient at bedside. Family was present. CSW spoke with patients daughter who says that the patient presents to the ED tonight because of dehydration, diarrhea, and back pain. According to daughter, patient lives at home alone in [place] with husband.  Daughter states that she is the patients primary support. However, she feels that she cannot take care of her because of her other life responsibilities. The family is interested in respite care at this time. The patient is currently diagnosed with cancer and daughter expressed to CSW that she fears it is progressing.  Willette Brace [phone] ED CSW 08/02/2014 12:49 AM

## 2014-08-02 NOTE — H&P (Deleted)
[facility] PROGRESS NOTE  Natasha Jackson QRF:[id number] DOB: [birth year] DOA: 08/01/2014 PCP: Thressa Sheller, MD  Assessment/Plan: Active Problems:   Dehydration, moderate    weakness and dehydration Multifactorial secondary to recent chemotherapy, diarrhea resulting in hypokalemia, recent fall with T12 fracture Continue repletion of electrolytes and fluids PT/OT evaluation  Hypokalemia Replete as potassium is still 3.3 Magnesium is okay Increase KCl and IV fluids   Diarrhea-could be secondary to chemotherapy?  C. difficile, GI pathogen panel-in process Rule out impaction KUB reviewed and negative  Abdominal pain likely referred from the back Improving Liver function normal Lipase normal Continue combination of Vicodin, tramadol for pain control   Multiple myeloma  revlimid contributingto diarrhea ? Follow follow-up with oncology in 3 weeks She is on Elotuzumab and Revlimid, last dose on 1/21 Her treatment is now on hold due to current hospitalization  Anemia in neoplastic disease This is likely anemia of chronic disease, chemotherapy 1/21 Currently asymptomatic from anemia . [name] The patient denies recent history of bleeding such as epistaxis, hematuria or hematochezia. She is asymptomatic from the anemia.  Transfuse for hemoglobin less than 8  Leukopenia Due to chemotherapy, oncology following  Vertebral compression fracture status post vertebroplasty Tylenol and tramadol for pain control recently received zometa    Code Status: full Family Communication: family updated about patient's clinical progress Disposition Plan:  PT/OT eval    Brief narrative: 81 year old female with a history of multiple myeloma, breast cancer, actively undergoing chemotherapy, last chemotherapy was on 1/21, presenting with nausea and diarrhea for the last 1-1/2 days, 4-5 loose stools, weakness, inability to go about her activities of daily living. The patient had a  mechanical fall 2 weeks ago and had a T12 fracture, she status post Vertebroplasty 1/26. Patient has had problems with pain regimen including Dilaudid, oxycodone. Dilaudid caused her to get constipated for which the patient not to take stool softeners and miralax from which she ended up having diarrhea. Patient and family are not sure if the patient is having side effects from the chemotherapy. Over the weekend the patient also had a low-grade fever.  Patient afebrile in the ED, normotensive, blood work reveals leukopenia, hypokalemia, and she is clinically dehydrated. Chest x-ray negative   Consultants:  Oncology*  Procedures:  None  Antibiotics: None  HPI/Subjective: Abdominal bloating, no nausea, has had 3 loose stools today  Objective: Filed Vitals:   08/01/14 2130 08/01/14 2230 08/02/14 0550 08/02/14 1350  BP: 150/56 120/58 135/57 159/47  Pulse: 65 68 58 62  Temp: 98.2 F (36.8 C) 98.2 F (36.8 C) 98 F (36.7 C) 98.7 F (37.1 C)  TempSrc: Oral Oral Oral Oral  Resp: 20 18 16 18   Height:      Weight:      SpO2: 97% 96% 96% 99%    Intake/Output Summary (Last 24 hours) at 08/02/14 1449 Last data filed at 08/02/14 1225  Gross per 24 hour  Intake    190 ml  Output    301 ml  Net   -111 ml    Exam:  GENERAL:alert, no distress and more comfortable.  SKIN: skin color, texture, turgor are normal, no rashes or significant lesions EYES: normal, conjunctiva are pink and non-injected, sclera clear OROPHARYNX:no exudate, no erythema and lips, buccal mucosa, and tongue normal  NECK: supple, thyroid normal size, non-tender, without nodularity LYMPH: no palpable lymphadenopathy in the cervical, axillary or inguinal LUNGS: clear to auscultation and percussion with normal breathing effort HEART: regular rate & rhythm and  no murmurs and no lower extremity edema ABDOMEN: soft, non-tender and normal bowel sounds      Data Reviewed: Basic Metabolic Panel:  Recent  Labs Lab 07/31/14 0807 08/01/14 1222 08/01/14 1809 08/02/14 0646  NA 136 136  --  137  K 3.8 3.3*  --  3.3*  CL 104 103  --  105  CO2 24 24  --  23  GLUCOSE 91 100*  --  71  BUN 8 5*  --  7  CREATININE 0.66 0.60 0.54 0.56  CALCIUM 8.1* 8.0*  --  7.5*  MG  --  1.8  --   --     Liver Function Tests:  Recent Labs Lab 08/01/14 1222 08/02/14 0646  AST 23 19  ALT 15 13  ALKPHOS 66 55  BILITOT 0.8 0.8  PROT 6.4 5.7*  ALBUMIN 3.1* 2.9*    Recent Labs Lab 08/01/14 1810  LIPASE 21   No results for input(s): AMMONIA in the last 168 hours.  CBC:  Recent Labs Lab 07/31/14 0807 08/01/14 1222 08/01/14 1809 08/02/14 0646  WBC 3.3* 2.6* 2.6* 2.7*  NEUTROABS 2.3 2.1  --   --   HGB 10.1* 10.2* 10.0* 9.3*  HCT 30.0* 30.7* 29.2* 27.6*  MCV 96.8 99.0 98.0 98.6  PLT 294 277 263 260    Cardiac Enzymes:  Recent Labs Lab 08/01/14 1809 08/02/14 0044 08/02/14 0646  TROPONINI <0.03 <0.03 <0.03   BNP (last 3 results) No results for input(s): PROBNP in the last 8760 hours.   CBG: No results for input(s): GLUCAP in the last 168 hours.  Recent Results (from the past 240 hour(s))  Clostridium Difficile by PCR     Status: None   Collection Time: 08/02/14  9:50 AM  Result Value Ref Range Status   C difficile by pcr NEGATIVE NEGATIVE Final    Comment: Performed at [facility]     Studies: Dg Chest 2 View  08/01/2014   CLINICAL DATA:  Diarrhea.  EXAM: CHEST  2 VIEW  COMPARISON:  July 18, 2014.  FINDINGS: Stable cardiomediastinal silhouette. No pneumothorax or pleural effusion is noted. Right internal jugular Port-A-Cath is noted with distal tip in expected position of the SVC. No acute pulmonary disease is noted. Status post kyphoplasty of lower thoracic vertebral body.  IMPRESSION: No acute cardiopulmonary abnormality seen.   Electronically Signed   By: Sabino Dick M.D.   On: 08/01/2014 14:15   Dg Cervical Spine Complete  07/18/2014   CLINICAL DATA:  Low back  pain, left neck pain after lifting mattress yesterday. History of multiple myeloma.  EXAM: CERVICAL SPINE  4+ VIEWS  COMPARISON:  None.  FINDINGS: Degenerative disc disease changes from C4-5 thru C6-7. Degenerative facet disease, left greater than right with slight anterolisthesis of C4 on C5. No fracture. Mild bilateral neural foraminal narrowing at C5-6 due to uncovertebral spurring and facet disease. Prevertebral soft tissues are normal.  IMPRESSION: Degenerative disc and facet disease as above. No acute bony abnormality.   Electronically Signed   By: Rolm Baptise M.D.   On: 07/18/2014 11:41   Dg Thoracic Spine 2 View  07/18/2014   CLINICAL DATA:  Severe back pain. Fall yesterday. History of multiple myeloma.  EXAM: THORACIC SPINE - 2 VIEW  COMPARISON:  Bone survey 02/05/2014  FINDINGS: Right jugular Port-A-Cath is noted. Mild thoracic levoscoliosis is noted. There is a new, mild T12 superior endplate compression fracture. There is no listhesis although the cervicothoracic junction is partially  obscured by overlapping shoulder tissues. Mild multilevel thoracic endplate osteophytosis is noted. No lytic or blastic osseous lesion is identified. Thoracic aortic calcification is noted. Right upper quadrant abdominal surgical clips.  IMPRESSION: New, mild T12 compression fracture.   Electronically Signed   By: Logan Bores   On: 07/18/2014 11:30   Dg Lumbar Spine Complete  07/18/2014   CLINICAL DATA:  Low back pain since an injury 07/17/2014 after lifting injury. History of multiple myeloma.  EXAM: LUMBAR SPINE - COMPLETE 4+ VIEW  COMPARISON:  Osseous survey 04/19/2013.  FINDINGS: No fracture is identified. Trace anterolisthesis L4 on L5 is noted. Intervertebral disc space height is maintained. Aortic atherosclerosis is noted.  IMPRESSION: Negative for fracture. No acute finding. Stable compared to prior exam.   Electronically Signed   By: Inge Rise M.D.   On: 07/18/2014 11:37   Dg Abd 1  View  08/01/2014   CLINICAL DATA:  Diarrhea.  History of multiple myeloma.  EXAM: ABDOMEN - 1 VIEW  COMPARISON:  None.  FINDINGS: The bowel gas pattern is normal. No radio-opaque calculi or other significant radiographic abnormality are seen.  IMPRESSION: Negative.   Electronically Signed   By: Kerby Moors M.D.   On: 08/01/2014 18:57   Mr Thoracic Spine Wo Contrast  07/24/2014   CLINICAL DATA:  Increasing back pain for 1 week after lifting injury. Multiple myeloma.  EXAM: MRI THORACIC SPINE WITHOUT CONTRAST  TECHNIQUE: Multiplanar, multisequence MR imaging of the thoracic spine was performed. No intravenous contrast was administered.  COMPARISON:  Radiographs dated 07/18/2014  FINDINGS: There is a subacute compression fracture of the superior endplate of H67. There is minimal protrusion of the posterior superior aspect of the T12 vertebra into the spinal canal with no neural impingement. This extends approximately 3 mm. No disc protrusion or bulging. The appearance is typical for benign osteoporotic compression fracture. There is a 5 mm lesion in the anterior left central aspect of T2 and an 8 mm lesion in the central left aspect of T6. These lesions are indeterminate in etiology. They are not appreciable on the prior CT scan of the chest dated 06/08/2005. They are not typical for hemangiomas.  There is scarring in both lung apices in there are tiny bilateral pleural effusions.  The thoracic spinal cord appears normal. No foraminal or spinal stenosis.  IMPRESSION: 1. Slight benign appearing compression fracture of T12, subacute. 2. Small lesions in T2 and T6 which are indeterminate in etiology.   Electronically Signed   By: Rozetta Nunnery M.D.   On: 07/24/2014 16:23   Ir Kypho Vertebral Thoracic Augmentation  07/31/2014   CLINICAL DATA:  Severe thoracolumbar pain secondary to compression fracture at T12.  EXAM: KYPHOPLASTY AT T12  PROCEDURE: Following a full explanation of the procedure along with the  potentially associated complications, an informed witnessed consent was obtained.  The patient was placed prone on the fluoroscopic table. The skin overlying the thoracolumbar region was then prepped and draped in the usual sterile fashion. Both pedicles at T12 were then infiltrated with 0.25% bupivacaine followed by the advancement of an 11-gauge Jamshidi needle through both pedicles into the posterior one-third at T12. This was then exchanged for a Kyphon advanced osteo introducer system comprised of a working cannula and a Kyphon osteo drill.  This combination was then advanced over a Kyphon osteo bone pin until the tip of the Kyphon osteo drill was in the posterior third at T12 bilaterally.  At this time, the bone pin  was removed. In a medial trajectory, the combination was advanced until the tip of the working cannula was inside the posterior one-third bilaterally at T2.  The osteo drill was removed and a core sample sent for pathologic analysis.  Through the working cannula, a Kyphon bone biopsy device was advanced to within 5 mm of the anterior aspect of T12. A core sample from this was also sent for pathologic analysis.  Through the working cannula, a Kyphon inflatable bone tamp 15 x 3 was advanced and positioned with the distal marker 5 mm from the anterior aspect of T2 through both pedicles. Crossing of the midline was seen on the AP projection. At this time, the balloons were expanded using contrast via a Kyphon inflation syringe device via microtubing.  Inflations were continued until there was apposition with the superior and the inferior endplates bilaterally.  At this time, methylmethacrylate mixture was reconstituted with Tobramycin in the Kyphon bone mixing device system. This was then loaded onto the Kyphon bone fillers.  The balloons were deflated and removed followed by the instillation of 2 bone filler equivalents of methylmethacrylate mixture at each pedicle with excellent filling in the AP and  lateral projections. No extravasation was noted in the disk spaces or posteriorly into the spinal canal. No epidural venous contamination was seen.  The patient tolerated the procedure well. There were no acute complications. The working cannula and the bone fillers were then retrieved and removed.  Medications utilized: Versed 3.5 mg IV and Fentanyl 150 micrograms IV. Dilaudid 1 mg IV.  Total Moderate Sedation Time:  30 minutes.  IMPRESSION: 1. Status post fluoroscopic-guided needle placement for deep core bone biopsy at T12  2. Status post vertebral body augmentation using balloon kyphoplasty at T12 as described without event.   Electronically Signed   By: Luanne Bras M.D.   On: 07/31/2014 13:22    Scheduled Meds: . aspirin  325 mg Oral Daily  . enoxaparin (LOVENOX) injection  40 mg Subcutaneous Q24H  . gabapentin  300 mg Oral TID  . levothyroxine  50 mcg Oral QAC breakfast  . senna-docusate  1 tablet Oral BID  . sodium chloride  3 mL Intravenous Q12H   Continuous Infusions: . 0.9 % NaCl with KCl 20 mEq / L 100 mL/hr at 08/02/14 0444    Active Problems:   Dehydration, moderate    Time spent: 40 minutes   [facility]  [facility] Pager [phone]. If 7PM-7AM, please contact night-coverage at [email or web address], password [facility] 08/02/2014, 2:49 PM  LOS: 1 day

## 2014-08-02 NOTE — Progress Notes (Signed)
Patient had 5 beats of V-Tach. Vitals were: 98.52F,HR 68,RR 18,B/P 120/58.Room Air oxygen sats were: 96% Patient was asymtomatic. PCP on call was notified.

## 2014-08-02 NOTE — Progress Notes (Signed)
Patient stated that she felt agitated and nervous. PCP on call was notified.

## 2014-08-02 NOTE — Progress Notes (Signed)
Natasha Jackson   DOB:18-Mar-1926   VV#:[id number]   LJQ#:[id number]  Patient Care Team: Thressa Sheller, MD as PCP - General (Internal Medicine) Thressa Sheller, MD (Internal Medicine) Renella Cunas, MD as Attending Physician (Cardiology) Heath Lark, MD as Consulting Physician (Hematology and Oncology) Rob Hickman, MD as Consulting Physician (Interventional Radiology)  I have seen the patient, examined her and edited the notes as follows  Subjective: Natasha Jackson is an 81 year old woman with a history of Multiple Myeloma, currently receiving Elotuzumab and Revlimid, last dose on 1/21, admitted with 2 day history of nausea and watery stools, progressive debilitation.  Of note, she was taking narcotics for the management of back pain, due to a T12 compression fracture, requiring vertebroplasty on 1/26. She experienced constipation, taking stool softeners and Miralax, resulting in diarrhea. She states that Elotuzumab may have contributed to her diarrhea as well. She denies any nausea or vomiting at this time. Her appetite is normal. On admission, she was afebrile. She denied any chills, night sweats, vision changes, or mucositis. Denies any respiratory complaints. Denies any chest pain or palpitations. Denies lower extremity swelling, hematemesis, hematuria or hematochezia. Her pain this morning is improved.  Ambulating with a rolling walker.  Her labs are remarkable for some leukopenia and anemia. She had hypokalemia in the setting of diarrhea which is now being corrected. Stool cultures are pending. We have been notified of the patient's admission.   SUMMARY OF ONCOLOGIC HISTORY: Oncology History        Multiple myeloma   11/12/2008 Imaging Skeletal survey showed lytic lesion in the right femur compatible with myeloma. There were Questionable skull lesions   11/15/2008 Bone Marrow Biopsy BONE MARROW ASPIRATE AND BIOPSY: showed NORMOCELLULAR MARROW FOR AGE WITH PLASMA CELL DYSCRASIA  (PLASMA CELLS 25%). 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 Subsequently she was placed on Revlimid & Dexamethasone alone   05/12/2013 Bone Marrow Biopsy Bone marrow biopsy showed persistent plasma cells. Blood work confirmed VGPR status   06/11/2014 Tumor Marker Bloodwork show disease relapse   06/26/2014 Procedure She has placement of port   1/21 Imaging MRI of the spine showed T12 compression fracture   07/05/2014 -  Chemotherapy She received Elotuzumab and Revlimid   07/19/14  She received Zometa   07/26/14 procedure Port-A Cath flushed   07/26/14 Chemotherapy She received Elotuzumab and Revlimid   07/31/14 procedure T12 vertebroplasty by Interventional Radiology   08/01/14 Hospital visit Admitted for low back pain control    Scheduled Meds: . aspirin  325 mg Oral Daily  . enoxaparin (LOVENOX) injection  40 mg Subcutaneous Q24H  . gabapentin  300 mg Oral TID  . levothyroxine  50 mcg Oral QAC breakfast  . sodium chloride  3 mL Intravenous Q12H   Continuous Infusions: . 0.9 % NaCl with KCl 20 mEq / L 100 mL/hr at 08/02/14 0444   PRN Meds:acetaminophen **OR** acetaminophen, HYDROmorphone, levalbuterol, ondansetron **OR** ondansetron (ZOFRAN) IV, oxyCODONE, traMADol   Objective:  Filed Vitals:   08/02/14 0550  BP: 135/57  Pulse: 58  Temp: 98 F (36.7 C)  Resp: 16      Intake/Output Summary (Last 24 hours) at 08/02/14 0730 Last data filed at 08/01/14 1900  Gross per 24 hour  Intake    190 ml  Output    300 ml  Net   -110 ml    ECOG PERFORMANCE  STATUS: 2  GENERAL:alert, no distress and more comfortable.  SKIN: skin color, texture, turgor are normal, no rashes or significant lesions EYES: normal, conjunctiva are pink and non-injected, sclera clear OROPHARYNX:no exudate, no erythema and lips, buccal  mucosa, and tongue normal  NECK: supple, thyroid normal size, non-tender, without nodularity LYMPH:  no palpable lymphadenopathy in the cervical, axillary or inguinal LUNGS: clear to auscultation and percussion with normal breathing effort HEART: regular rate & rhythm and no murmurs and no lower extremity edema ABDOMEN: soft, non-tender and normal bowel sounds Musculoskeletal:no cyanosis of digits and no clubbing. Low back tenderness, chronic. PSYCH: alert & oriented x 3 with fluent speech.Tearful this morning due to events leading to admission.  NEURO: no focal motor/sensory deficits, except for known neuropathy in feet.    CBG (last 3)  No results for input(s): GLUCAP in the last 72 hours.   Labs:   Recent Labs Lab 07/26/14 1026 07/31/14 0807 08/01/14 1222 08/01/14 1809 08/02/14 0646  WBC 3.0* 3.3* 2.6* 2.6* 2.7*  HGB 10.1* 10.1* 10.2* 10.0* 9.3*  HCT 31.4* 30.0* 30.7* 29.2* 27.6*  PLT 248 294 277 263 260  MCV 100.4 96.8 99.0 98.0 98.6  MCH 32.3 32.6 32.9 33.6 33.2  MCHC 32.2 33.7 33.2 34.2 33.7  RDW 13.6 13.9 13.9 13.8 13.9  LYMPHSABS 0.5* 0.6* 0.4*  --   --   MONOABS 0.4 0.3 0.1  --   --   EOSABS 0.1 0.1 0.0  --   --   BASOSABS 0.0 0.0 0.0  --   --      Chemistries:    Recent Labs Lab 07/26/14 1026 07/31/14 0807 08/01/14 1222 08/01/14 1809  NA 134* 136 136  --   K 4.1 3.8 3.3*  --   CL  --  104 103  --   CO2 26 24 24   --   GLUCOSE 84 91 100*  --   BUN 9.4 8 5*  --   CREATININE 0.7 0.66 0.60 0.54  CALCIUM 7.9* 8.1* 8.0*  --   MG  --   --  1.8  --   AST 15  --  23  --   ALT 9  --  15  --   ALKPHOS 60  --  66  --   BILITOT 0.43  --  0.8  --     GFR Estimated Creatinine Clearance: 45.4 mL/min (by C-G formula based on Cr of 0.54).  Liver Function Tests:  Recent Labs Lab 07/26/14 1026 08/01/14 1222  AST 15 23  ALT 9 15  ALKPHOS 60 66  BILITOT 0.43 0.8  PROT 6.7 6.4  ALBUMIN 3.2* 3.1*    Recent Labs Lab 08/01/14 1810  LIPASE 21   No  results for input(s): AMMONIA in the last 168 hours.  Urine Studies     Component Value Date/Time   COLORURINE YELLOW 08/01/2014 1820   APPEARANCEUR CLEAR 08/01/2014 1820   LABSPEC 1.009 08/01/2014 1820   PHURINE 6.5 08/01/2014 1820   GLUCOSEU NEGATIVE 08/01/2014 1820   HGBUR SMALL* 08/01/2014 1820   BILIRUBINUR NEGATIVE 08/01/2014 1820   KETONESUR 40* 08/01/2014 1820   PROTEINUR NEGATIVE 08/01/2014 1820   UROBILINOGEN 0.2 08/01/2014 1820   NITRITE NEGATIVE 08/01/2014 1820   LEUKOCYTESUR NEGATIVE 08/01/2014 1820    Coagulation profile  Recent Labs Lab 07/31/14 0807  INR 1.02    Cardiac Enzymes:  Recent Labs Lab 08/01/14 1809 08/02/14 0044  TROPONINI <0.03 <0.03   Thyroid function studies  Recent  Labs  08/01/14 1809  TSH 0.711   Microbiology No results found for this or any previous visit (from the past 240 hour(s)).     Imaging Studies:  Dg Chest 2 View  08/01/2014   CLINICAL DATA:  Diarrhea.  EXAM: CHEST  2 VIEW  COMPARISON:  July 18, 2014.  FINDINGS: Stable cardiomediastinal silhouette. No pneumothorax or pleural effusion is noted. Right internal jugular Port-A-Cath is noted with distal tip in expected position of the SVC. No acute pulmonary disease is noted. Status post kyphoplasty of lower thoracic vertebral body.  IMPRESSION: No acute cardiopulmonary abnormality seen.   Electronically Signed   By: Sabino Dick M.D.   On: 08/01/2014 14:15   Dg Abd 1 View  08/01/2014   CLINICAL DATA:  Diarrhea.  History of multiple myeloma.  EXAM: ABDOMEN - 1 VIEW  COMPARISON:  None.  FINDINGS: The bowel gas pattern is normal. No radio-opaque calculi or other significant radiographic abnormality are seen.  IMPRESSION: Negative.   Electronically Signed   By: Kerby Moors M.D.   On: 08/01/2014 18:57   Ir Kypho Vertebral Thoracic Augmentation  07/31/2014   CLINICAL DATA:  Severe thoracolumbar pain secondary to compression fracture at T12.  EXAM: KYPHOPLASTY AT T12   PROCEDURE: Following a full explanation of the procedure along with the potentially associated complications, an informed witnessed consent was obtained.  The patient was placed prone on the fluoroscopic table. The skin overlying the thoracolumbar region was then prepped and draped in the usual sterile fashion. Both pedicles at T12 were then infiltrated with 0.25% bupivacaine followed by the advancement of an 11-gauge Jamshidi needle through both pedicles into the posterior one-third at T12. This was then exchanged for a Kyphon advanced osteo introducer system comprised of a working cannula and a Kyphon osteo drill.  This combination was then advanced over a Kyphon osteo bone pin until the tip of the Kyphon osteo drill was in the posterior third at T12 bilaterally.  At this time, the bone pin was removed. In a medial trajectory, the combination was advanced until the tip of the working cannula was inside the posterior one-third bilaterally at T2.  The osteo drill was removed and a core sample sent for pathologic analysis.  Through the working cannula, a Kyphon bone biopsy device was advanced to within 5 mm of the anterior aspect of T12. A core sample from this was also sent for pathologic analysis.  Through the working cannula, a Kyphon inflatable bone tamp 15 x 3 was advanced and positioned with the distal marker 5 mm from the anterior aspect of T2 through both pedicles. Crossing of the midline was seen on the AP projection. At this time, the balloons were expanded using contrast via a Kyphon inflation syringe device via microtubing.  Inflations were continued until there was apposition with the superior and the inferior endplates bilaterally.  At this time, methylmethacrylate mixture was reconstituted with Tobramycin in the Kyphon bone mixing device system. This was then loaded onto the Kyphon bone fillers.  The balloons were deflated and removed followed by the instillation of 2 bone filler equivalents of  methylmethacrylate mixture at each pedicle with excellent filling in the AP and lateral projections. No extravasation was noted in the disk spaces or posteriorly into the spinal canal. No epidural venous contamination was seen.  The patient tolerated the procedure well. There were no acute complications. The working cannula and the bone fillers were then retrieved and removed.  Medications utilized: Versed 3.5 mg  IV and Fentanyl 150 micrograms IV. Dilaudid 1 mg IV.  Total Moderate Sedation Time:  30 minutes.  IMPRESSION: 1. Status post fluoroscopic-guided needle placement for deep core bone biopsy at T12  2. Status post vertebral body augmentation using balloon kyphoplasty at T12 as described without event.   Electronically Signed   By: Luanne Bras M.D.   On: 07/31/2014 13:22    Assessment/Plan: 81 y.o.   Multiple myeloma She is on Elotuzumab and Revlimid, last dose on 1/21 Her treatment is now on hold due to current hospitalization issues I will see her back in 3 weeks for further discussion about treatment options. Recent repeat serum protein electrophoresis suggested that she is responding to treatment.  Vertebral compression fracture Lower Back pain A thoracic x-ray on 07/18/14 showed a new T12 superior end plate compression fracture She underwent T12 vertebroplasty with biopsy on 1/26, results pending She will continue Zometa to keep her bones strong, last dose on 1/14. The patient is on calcium and vitamin D. She was admitted on 1/27 for symptom control. Continue current pain regimen as needed. She prefers not to take Dilaudid and try non narcotic meds such as Ultram first. I recommend she try hydrocodone before she engage in physical activity  Anemia in neoplastic disease This is likely anemia of chronic disease, recent chemotherapy on 1/21.  The patient denies recent history of bleeding such as epistaxis, hematuria or hematochezia.  She is asymptomatic from the anemia. We will  observe for now. She does not require transfusion now. Will consider transfusion to maintain a Hb of 8 g or if the patient is acutely bleeding  Leukopenia Due to recent chemotherapy, dilution Current WBC is 2.7 with ANC of 2.1 No intervention is indicated at this time Continue to closely monitor  Diarrhea This is likely due to recent chemotherapy and laxatives/stool softeners taken due to constipation secondary to narcotic pain medications and decreased mobility. Stools cultures are pending No loose stools noted this morning  Hypokalemia, resolved Due to diarrhea Resolved with replenishment  Deconditioning Due to decreased mobility She may need OT/PT involvement  DVT prophylaxis On Lovenox  Discharge planning Case Managers/Social Work involved. Patient wishes to be discharged with home health versus assisted living.   Full Code   Other medical issues as per admitting team  I will sign off. She has appointment to see me back in 3 weeks. All questions were addressed.   **Disclaimer: This note was dictated with voice recognition software. Similar sounding words can inadvertently be transcribed and this note may contain transcription errors which may not have been corrected upon publication of note.Sharene Butters E, PA-C 08/02/2014  7:30 AM Taytum Wheller, MD 08/02/2014

## 2014-08-03 ENCOUNTER — Encounter (HOSPITAL_COMMUNITY): Payer: Self-pay | Admitting: Radiology

## 2014-08-03 ENCOUNTER — Other Ambulatory Visit (HOSPITAL_COMMUNITY): Payer: Medicare Other

## 2014-08-03 ENCOUNTER — Telehealth: Payer: Self-pay | Admitting: Hematology and Oncology

## 2014-08-03 ENCOUNTER — Inpatient Hospital Stay (HOSPITAL_COMMUNITY): Payer: Medicare Other

## 2014-08-03 DIAGNOSIS — M7989 Other specified soft tissue disorders: Secondary | ICD-10-CM

## 2014-08-03 DIAGNOSIS — R0602 Shortness of breath: Secondary | ICD-10-CM

## 2014-08-03 LAB — COMPREHENSIVE METABOLIC PANEL
ALBUMIN: 3.2 g/dL — AB (ref 3.5–5.2)
ALT: 14 U/L (ref 0–35)
ANION GAP: 6 (ref 5–15)
AST: 22 U/L (ref 0–37)
Alkaline Phosphatase: 56 U/L (ref 39–117)
BUN: 5 mg/dL — AB (ref 6–23)
CHLORIDE: 106 mmol/L (ref 96–112)
CO2: 23 mmol/L (ref 19–32)
CREATININE: 0.54 mg/dL (ref 0.50–1.10)
Calcium: 7.7 mg/dL — ABNORMAL LOW (ref 8.4–10.5)
GFR calc non Af Amer: 83 mL/min — ABNORMAL LOW (ref >90)
GLUCOSE: 96 mg/dL (ref 70–99)
Potassium: 4.1 mmol/L (ref 3.5–5.1)
Sodium: 135 mmol/L (ref 135–145)
Total Bilirubin: 0.6 mg/dL (ref 0.3–1.2)
Total Protein: 6.1 g/dL (ref 6.0–8.3)

## 2014-08-03 MED ORDER — LORAZEPAM 0.5 MG PO TABS
0.2500 mg | ORAL_TABLET | Freq: Four times a day (QID) | ORAL | Status: DC | PRN
  Administered 2014-08-03 – 2014-08-05 (×4): 0.25 mg via ORAL
  Filled 2014-08-03 (×4): qty 1

## 2014-08-03 MED ORDER — IOHEXOL 350 MG/ML SOLN
100.0000 mL | Freq: Once | INTRAVENOUS | Status: AC | PRN
  Administered 2014-08-03: 100 mL via INTRAVENOUS

## 2014-08-03 MED ORDER — POTASSIUM CHLORIDE IN NACL 20-0.9 MEQ/L-% IV SOLN
INTRAVENOUS | Status: DC
  Administered 2014-08-03 – 2014-08-04 (×2): via INTRAVENOUS
  Filled 2014-08-03 (×2): qty 1000

## 2014-08-03 NOTE — Progress Notes (Signed)
PT Cancellation Note  Patient Details Name: Natasha Jackson MRN: [id number] DOB: 1926/05/22   Cancelled Treatment:    Reason Eval/Treat Not Completed: Other (comment); attempted to see pt 2 more times today, pt eating lunch, then refused d/t fatigue and not feeling well; will attempt again next day or as schedule permits   [facility] 08/03/2014, 1:25 PM

## 2014-08-03 NOTE — Progress Notes (Signed)
CARE MANAGEMENT NOTE 08/03/2014  Patient:  [facility] R   Account Number:  [id number]  Date Initiated:  08/03/2014  Documentation initiated by:  Dessa Phi  Subjective/Objective Assessment:   81 y/o f admitted w/Dehydration.     Action/Plan:   From home.   Anticipated DC Date:  08/06/2014   Anticipated DC Plan:  [place]  CM consult      Choice offered to / List presented to:             Status of service:  In process, will continue to follow Medicare Important Message given?  YES (If response is "NO", the following Medicare IM given date fields will be blank) Date Medicare IM given:  08/03/2014 Medicare IM given by:  [facility] Date Additional Medicare IM given:   Additional Medicare IM given by:    Discharge Disposition:    Per UR Regulation:  Reviewed for med. necessity/level of care/duration of stay  If discussed at [place] of Stay Meetings, dates discussed:    Comments:  08/03/14 Dessa Phi RN BSN NCM  [phone] Await PT recommendations.CSW already following.

## 2014-08-03 NOTE — Progress Notes (Signed)
PT Cancellation Note  Patient Details Name: Natasha Jackson MRN: [id number] DOB: [birth year]   Cancelled Treatment:    Reason Eval/Treat Not Completed: Patient at procedure or test/unavailable (gone for CT scan, will check back as schedule permits)   [facility] 08/03/2014, 12:11 PM

## 2014-08-03 NOTE — Progress Notes (Signed)
Clinical Social Work Department CLINICAL SOCIAL WORK PLACEMENT NOTE 08/03/2014  Patient:  Natasha Jackson, Natasha Jackson  Account Number:  [id number] Admit date:  08/01/2014  Clinical Social Worker:  Renold Genta  Date/time:  08/03/2014 03:20 PM  Clinical Social Work is seeking post-discharge placement for this patient at the following level of care:   SKILLED NURSING   (*CSW will update this form in Epic as items are completed)   08/03/2014  Patient/family provided with [place] Department of Clinical Social Work's list of facilities offering this level of care within the geographic area requested by the patient (or if unable, by the patient's family).  08/03/2014  Patient/family informed of their freedom to choose among providers that offer the needed level of care, that participate in Medicare, Medicaid or managed care program needed by the patient, have an available bed and are willing to accept the patient.  08/03/2014  Patient/family informed of [facility]' ownership interest in [facility], as well as of the fact that they are under no obligation to receive care at this facility.  PASARR submitted to EDS on 08/03/2014 PASARR number received on 08/03/2014  FL2 transmitted to all facilities in geographic area requested by pt/family on  08/03/2014 FL2 transmitted to all facilities within larger geographic area on   Patient informed that his/her managed care company has contracts with or will negotiate with  certain facilities, including the following:     Patient/family informed of bed offers received:  08/03/2014 Patient chooses bed at  Physician recommends and patient chooses bed at    Patient to be transferred to  on   Patient to be transferred to facility by  Patient and family notified of transfer on  Name of family member notified:    The following physician request were entered in Epic:   Additional Comments:    Raynaldo Opitz, [place] Social Worker cell #: [phone]

## 2014-08-03 NOTE — Progress Notes (Signed)
VASCULAR LAB PRELIMINARY  PRELIMINARY  PRELIMINARY  PRELIMINARY  Bilateral lower extremity venous duplex completed.    Preliminary report:  Bilateral:  No evidence of DVT, superficial thrombosis, or Baker's Cyst.   Natasha Jackson, RVS 08/03/2014, 6:03 PM

## 2014-08-03 NOTE — Progress Notes (Signed)
[facility] PROGRESS NOTE  Natasha Jackson WLN:[id number] DOB: Jan 29, 1926 DOA: 08/01/2014 PCP: Thressa Sheller, MD  Assessment/Plan: Active Problems:   Dehydration, moderate    weakness and dehydration Multifactorial secondary to recent chemotherapy, diarrhea resulting in hypokalemia, recent fall with T12 fracture Continue repletion of electrolytes and fluids PT/OT evaluation  Hypokalemia Replete as potassium is still 3.3 Magnesium is okay Increase KCl and IV fluids   Diarrhea-could be secondary to chemotherapy?  C. difficile, GI pathogen panel-in process Rule out impaction KUB reviewed and negative  Abdominal pain likely referred from the back Improving Liver function normal Lipase normal Continue combination of Vicodin, tramadol for pain control   Multiple myeloma  revlimid contributingto diarrhea ? Follow follow-up with oncology in 3 weeks She is on Elotuzumab and Revlimid, last dose on 1/21 Her treatment is now on hold due to current hospitalization  Anemia in neoplastic disease This is likely anemia of chronic disease, chemotherapy 1/21 Currently asymptomatic from anemia . [name] The patient denies recent history of bleeding such as epistaxis, hematuria or hematochezia. She is asymptomatic from the anemia.  Transfuse for hemoglobin less than 8  Leukopenia Due to chemotherapy, oncology following  Vertebral compression fracture status post vertebroplasty Tylenol and tramadol for pain control recently received zometa    Code Status: full Family Communication: family updated about patient's clinical progress Disposition Plan:  PT/OT eval    Brief narrative: 81 year old female with a history of multiple myeloma, breast cancer, actively undergoing chemotherapy, last chemotherapy was on 1/21, presenting with nausea and diarrhea for the last 1-1/2 days, 4-5 loose stools, weakness, inability to go about her activities of daily living. The patient had a  mechanical fall 2 weeks ago and had a T12 fracture, she status post Vertebroplasty 1/26. Patient has had problems with pain regimen including Dilaudid, oxycodone. Dilaudid caused her to get constipated for which the patient not to take stool softeners and miralax from which she ended up having diarrhea. Patient and family are not sure if the patient is having side effects from the chemotherapy. Over the weekend the patient also had a low-grade fever.  Patient afebrile in the ED, normotensive, blood work reveals leukopenia, hypokalemia, and she is clinically dehydrated. Chest x-ray negative   Consultants:  Oncology*  Procedures:  None  Antibiotics: None  HPI/Subjective: Abdominal bloating, no nausea, has had 3 loose stools today  Objective: Filed Vitals:   08/02/14 0550 08/02/14 1350 08/02/14 2232 08/03/14 0539  BP: 135/57 159/47 118/47 136/52  Pulse: 58 62 65 52  Temp: 98 F (36.7 C) 98.7 F (37.1 C) 98.3 F (36.8 C) 97.4 F (36.3 C)  TempSrc: Oral Oral Oral Oral  Resp: 16 18 16 16   Height:      Weight:      SpO2: 96% 99% 97% 98%    Intake/Output Summary (Last 24 hours) at 08/03/14 0832 Last data filed at 08/03/14 0655  Gross per 24 hour  Intake 1578.75 ml  Output    601 ml  Net 977.75 ml    Exam:  GENERAL:alert, no distress and more comfortable.  SKIN: skin color, texture, turgor are normal, no rashes or significant lesions EYES: normal, conjunctiva are pink and non-injected, sclera clear OROPHARYNX:no exudate, no erythema and lips, buccal mucosa, and tongue normal  NECK: supple, thyroid normal size, non-tender, without nodularity LYMPH: no palpable lymphadenopathy in the cervical, axillary or inguinal LUNGS: clear to auscultation and percussion with normal breathing effort HEART: regular rate & rhythm and no murmurs and no lower  extremity edema ABDOMEN: soft, non-tender and normal bowel sounds      Data Reviewed: Basic Metabolic Panel:  Recent  Labs Lab 07/31/14 0807 08/01/14 1222 08/01/14 1809 08/02/14 0646  NA 136 136  --  137  K 3.8 3.3*  --  3.3*  CL 104 103  --  105  CO2 24 24  --  23  GLUCOSE 91 100*  --  71  BUN 8 5*  --  7  CREATININE 0.66 0.60 0.54 0.56  CALCIUM 8.1* 8.0*  --  7.5*  MG  --  1.8  --   --     Liver Function Tests:  Recent Labs Lab 08/01/14 1222 08/02/14 0646  AST 23 19  ALT 15 13  ALKPHOS 66 55  BILITOT 0.8 0.8  PROT 6.4 5.7*  ALBUMIN 3.1* 2.9*    Recent Labs Lab 08/01/14 1810  LIPASE 21   No results for input(s): AMMONIA in the last 168 hours.  CBC:  Recent Labs Lab 07/31/14 0807 08/01/14 1222 08/01/14 1809 08/02/14 0646  WBC 3.3* 2.6* 2.6* 2.7*  NEUTROABS 2.3 2.1  --   --   HGB 10.1* 10.2* 10.0* 9.3*  HCT 30.0* 30.7* 29.2* 27.6*  MCV 96.8 99.0 98.0 98.6  PLT 294 277 263 260    Cardiac Enzymes:  Recent Labs Lab 08/01/14 1809 08/02/14 0044 08/02/14 0646  TROPONINI <0.03 <0.03 <0.03   BNP (last 3 results) No results for input(s): PROBNP in the last 8760 hours.   CBG: No results for input(s): GLUCAP in the last 168 hours.  Recent Results (from the past 240 hour(s))  Clostridium Difficile by PCR     Status: None   Collection Time: 08/02/14  9:50 AM  Result Value Ref Range Status   C difficile by pcr NEGATIVE NEGATIVE Final    Comment: Performed at [facility]     Studies: Dg Chest 2 View  08/01/2014   CLINICAL DATA:  Diarrhea.  EXAM: CHEST  2 VIEW  COMPARISON:  July 18, 2014.  FINDINGS: Stable cardiomediastinal silhouette. No pneumothorax or pleural effusion is noted. Right internal jugular Port-A-Cath is noted with distal tip in expected position of the SVC. No acute pulmonary disease is noted. Status post kyphoplasty of lower thoracic vertebral body.  IMPRESSION: No acute cardiopulmonary abnormality seen.   Electronically Signed   By: Sabino Dick M.D.   On: 08/01/2014 14:15   Dg Cervical Spine Complete  07/18/2014   CLINICAL DATA:  Low back  pain, left neck pain after lifting mattress yesterday. History of multiple myeloma.  EXAM: CERVICAL SPINE  4+ VIEWS  COMPARISON:  None.  FINDINGS: Degenerative disc disease changes from C4-5 thru C6-7. Degenerative facet disease, left greater than right with slight anterolisthesis of C4 on C5. No fracture. Mild bilateral neural foraminal narrowing at C5-6 due to uncovertebral spurring and facet disease. Prevertebral soft tissues are normal.  IMPRESSION: Degenerative disc and facet disease as above. No acute bony abnormality.   Electronically Signed   By: Rolm Baptise M.D.   On: 07/18/2014 11:41   Dg Thoracic Spine 2 View  07/18/2014   CLINICAL DATA:  Severe back pain. Fall yesterday. History of multiple myeloma.  EXAM: THORACIC SPINE - 2 VIEW  COMPARISON:  Bone survey 02/05/2014  FINDINGS: Right jugular Port-A-Cath is noted. Mild thoracic levoscoliosis is noted. There is a new, mild T12 superior endplate compression fracture. There is no listhesis although the cervicothoracic junction is partially obscured by overlapping shoulder tissues.  Mild multilevel thoracic endplate osteophytosis is noted. No lytic or blastic osseous lesion is identified. Thoracic aortic calcification is noted. Right upper quadrant abdominal surgical clips.  IMPRESSION: New, mild T12 compression fracture.   Electronically Signed   By: Logan Bores   On: 07/18/2014 11:30   Dg Lumbar Spine Complete  07/18/2014   CLINICAL DATA:  Low back pain since an injury 07/17/2014 after lifting injury. History of multiple myeloma.  EXAM: LUMBAR SPINE - COMPLETE 4+ VIEW  COMPARISON:  Osseous survey 04/19/2013.  FINDINGS: No fracture is identified. Trace anterolisthesis L4 on L5 is noted. Intervertebral disc space height is maintained. Aortic atherosclerosis is noted.  IMPRESSION: Negative for fracture. No acute finding. Stable compared to prior exam.   Electronically Signed   By: Inge Rise M.D.   On: 07/18/2014 11:37   Dg Abd 1  View  08/01/2014   CLINICAL DATA:  Diarrhea.  History of multiple myeloma.  EXAM: ABDOMEN - 1 VIEW  COMPARISON:  None.  FINDINGS: The bowel gas pattern is normal. No radio-opaque calculi or other significant radiographic abnormality are seen.  IMPRESSION: Negative.   Electronically Signed   By: Kerby Moors M.D.   On: 08/01/2014 18:57   Mr Thoracic Spine Wo Contrast  07/24/2014   CLINICAL DATA:  Increasing back pain for 1 week after lifting injury. Multiple myeloma.  EXAM: MRI THORACIC SPINE WITHOUT CONTRAST  TECHNIQUE: Multiplanar, multisequence MR imaging of the thoracic spine was performed. No intravenous contrast was administered.  COMPARISON:  Radiographs dated 07/18/2014  FINDINGS: There is a subacute compression fracture of the superior endplate of J68. There is minimal protrusion of the posterior superior aspect of the T12 vertebra into the spinal canal with no neural impingement. This extends approximately 3 mm. No disc protrusion or bulging. The appearance is typical for benign osteoporotic compression fracture. There is a 5 mm lesion in the anterior left central aspect of T2 and an 8 mm lesion in the central left aspect of T6. These lesions are indeterminate in etiology. They are not appreciable on the prior CT scan of the chest dated 06/08/2005. They are not typical for hemangiomas.  There is scarring in both lung apices in there are tiny bilateral pleural effusions.  The thoracic spinal cord appears normal. No foraminal or spinal stenosis.  IMPRESSION: 1. Slight benign appearing compression fracture of T12, subacute. 2. Small lesions in T2 and T6 which are indeterminate in etiology.   Electronically Signed   By: Rozetta Nunnery M.D.   On: 07/24/2014 16:23   Ir Kypho Vertebral Thoracic Augmentation  07/31/2014   CLINICAL DATA:  Severe thoracolumbar pain secondary to compression fracture at T12.  EXAM: KYPHOPLASTY AT T12  PROCEDURE: Following a full explanation of the procedure along with the  potentially associated complications, an informed witnessed consent was obtained.  The patient was placed prone on the fluoroscopic table. The skin overlying the thoracolumbar region was then prepped and draped in the usual sterile fashion. Both pedicles at T12 were then infiltrated with 0.25% bupivacaine followed by the advancement of an 11-gauge Jamshidi needle through both pedicles into the posterior one-third at T12. This was then exchanged for a Kyphon advanced osteo introducer system comprised of a working cannula and a Kyphon osteo drill.  This combination was then advanced over a Kyphon osteo bone pin until the tip of the Kyphon osteo drill was in the posterior third at T12 bilaterally.  At this time, the bone pin was removed. In a medial  trajectory, the combination was advanced until the tip of the working cannula was inside the posterior one-third bilaterally at T2.  The osteo drill was removed and a core sample sent for pathologic analysis.  Through the working cannula, a Kyphon bone biopsy device was advanced to within 5 mm of the anterior aspect of T12. A core sample from this was also sent for pathologic analysis.  Through the working cannula, a Kyphon inflatable bone tamp 15 x 3 was advanced and positioned with the distal marker 5 mm from the anterior aspect of T2 through both pedicles. Crossing of the midline was seen on the AP projection. At this time, the balloons were expanded using contrast via a Kyphon inflation syringe device via microtubing.  Inflations were continued until there was apposition with the superior and the inferior endplates bilaterally.  At this time, methylmethacrylate mixture was reconstituted with Tobramycin in the Kyphon bone mixing device system. This was then loaded onto the Kyphon bone fillers.  The balloons were deflated and removed followed by the instillation of 2 bone filler equivalents of methylmethacrylate mixture at each pedicle with excellent filling in the AP and  lateral projections. No extravasation was noted in the disk spaces or posteriorly into the spinal canal. No epidural venous contamination was seen.  The patient tolerated the procedure well. There were no acute complications. The working cannula and the bone fillers were then retrieved and removed.  Medications utilized: Versed 3.5 mg IV and Fentanyl 150 micrograms IV. Dilaudid 1 mg IV.  Total Moderate Sedation Time:  30 minutes.  IMPRESSION: 1. Status post fluoroscopic-guided needle placement for deep core bone biopsy at T12  2. Status post vertebral body augmentation using balloon kyphoplasty at T12 as described without event.   Electronically Signed   By: Luanne Bras M.D.   On: 07/31/2014 13:22    Scheduled Meds: . aspirin  325 mg Oral Daily  . enoxaparin (LOVENOX) injection  40 mg Subcutaneous Q24H  . gabapentin  300 mg Oral TID  . levothyroxine  50 mcg Oral QAC breakfast  . senna-docusate  1 tablet Oral BID  . sodium chloride  3 mL Intravenous Q12H   Continuous Infusions: . 0.9 % sodium chloride with kcl 75 mL/hr at 08/03/14 0600    Active Problems:   Dehydration, moderate    Time spent: 40 minutes   [facility] Pager [phone]. If 7PM-7AM, please contact night-coverage at [email or web address], password [facility] 08/03/2014, 8:32 AM  LOS: 2 days

## 2014-08-03 NOTE — Progress Notes (Signed)
[facility] PROGRESS NOTE  Natasha Jackson OIB:[id number] DOB: 03/05/1926 DOA: 08/01/2014 PCP: Thressa Sheller, MD  Assessment/Plan: Active Problems:   Dehydration, moderate    weakness and dehydration Multifactorial secondary to recent chemotherapy, diarrhea resulting in hypokalemia, recent fall with T12 fracture Strength is improving, patient ambulating PT/OT evaluation is still pending  Hypokalemia Repleted, change IVF to normal saline with 20 of KCl Magnesium is okay Continue gentle hydration   Diarrhea-could be secondary to chemotherapy? Actually still is more formed now but still has a frequency of 3-4 bowel movements a day  C. difficile negative, GI pathogen panel-in process KUB negative upon admission Discontinue Senokot due to increased stool frequency  Sinus tachycardia Likely secondary to uncontrolled pain Discussed with oncology, will obtain CT angiogram of the chest and venous Doppler to rule out PE and DVT  Back pain because of recent T12 fracture, abdominal pain resolved Exacerbated today, alternating tramadol with Vicodin PT/OT eval   Multiple myeloma  revlimid contributingto diarrhea ? Follow follow-up with oncology in 3 weeks She is on Elotuzumab and Revlimid, last dose on 1/21 Her treatment is now on hold due to current hospitalization   Anemia in neoplastic disease This is likely anemia of chronic disease, chemotherapy 1/21 Currently asymptomatic from anemia Stable hemoglobin Transfuse for hemoglobin less than 8  Leukopenia Due to chemotherapy, oncology following  Vertebral compression fracture status post vertebroplasty Tylenol and tramadol for pain control recently received zometa    Code Status: full Family Communication: family updated about patient's clinical progress Disposition Plan:  PT/OT eval    Brief narrative: 81 year old female with a history of multiple myeloma, breast cancer, actively undergoing chemotherapy, last  chemotherapy was on 1/21, presenting with nausea and diarrhea for the last 1-1/2 days, 4-5 loose stools, weakness, inability to go about her activities of daily living. The patient had a mechanical fall 2 weeks ago and had a T12 fracture, she status post Vertebroplasty 1/26. Patient has had problems with pain regimen including Dilaudid, oxycodone. Dilaudid caused her to get constipated for which the patient not to take stool softeners and miralax from which she ended up having diarrhea. Patient and family are not sure if the patient is having side effects from the chemotherapy. Over the weekend the patient also had a low-grade fever.  Patient afebrile in the ED, normotensive, blood work reveals leukopenia, hypokalemia, and she is clinically dehydrated. Chest x-ray negative   Consultants:  Oncology*  Procedures:  None  Antibiotics: None  HPI/Subjective: Complaining of significant amount of back pain, patient is tachycardic with ambulation  Objective: Filed Vitals:   08/02/14 0550 08/02/14 1350 08/02/14 2232 08/03/14 0539  BP: 135/57 159/47 118/47 136/52  Pulse: 58 62 65 52  Temp: 98 F (36.7 C) 98.7 F (37.1 C) 98.3 F (36.8 C) 97.4 F (36.3 C)  TempSrc: Oral Oral Oral Oral  Resp: 16 18 16 16   Height:      Weight:      SpO2: 96% 99% 97% 98%    Intake/Output Summary (Last 24 hours) at 08/03/14 1157 Last data filed at 08/03/14 0655  Gross per 24 hour  Intake 1578.75 ml  Output    601 ml  Net 977.75 ml    Exam:  GENERAL: Tachycardic, in pain SKIN: skin color, texture, turgor are normal, no rashes or significant lesions EYES: normal, conjunctiva are pink and non-injected, sclera clear OROPHARYNX:no exudate, no erythema and lips, buccal mucosa, and tongue normal  NECK: supple, thyroid normal size, non-tender, without nodularity LYMPH:  no palpable lymphadenopathy in the cervical, axillary or inguinal LUNGS: clear to auscultation and percussion with normal breathing  effort HEART: regular rate & rhythm and no murmurs and no lower extremity edema ABDOMEN: soft, non-tender and normal bowel sounds      Data Reviewed: Basic Metabolic Panel:  Recent Labs Lab 07/31/14 0807 08/01/14 1222 08/01/14 1809 08/02/14 0646 08/03/14 0915  NA 136 136  --  137 135  K 3.8 3.3*  --  3.3* 4.1  CL 104 103  --  105 106  CO2 24 24  --  23 23  GLUCOSE 91 100*  --  71 96  BUN 8 5*  --  7 5*  CREATININE 0.66 0.60 0.54 0.56 0.54  CALCIUM 8.1* 8.0*  --  7.5* 7.7*  MG  --  1.8  --   --   --     Liver Function Tests:  Recent Labs Lab 08/01/14 1222 08/02/14 0646 08/03/14 0915  AST 23 19 22   ALT 15 13 14   ALKPHOS 66 55 56  BILITOT 0.8 0.8 0.6  PROT 6.4 5.7* 6.1  ALBUMIN 3.1* 2.9* 3.2*    Recent Labs Lab 08/01/14 1810  LIPASE 21   No results for input(s): AMMONIA in the last 168 hours.  CBC:  Recent Labs Lab 07/31/14 0807 08/01/14 1222 08/01/14 1809 08/02/14 0646  WBC 3.3* 2.6* 2.6* 2.7*  NEUTROABS 2.3 2.1  --   --   HGB 10.1* 10.2* 10.0* 9.3*  HCT 30.0* 30.7* 29.2* 27.6*  MCV 96.8 99.0 98.0 98.6  PLT 294 277 263 260    Cardiac Enzymes:  Recent Labs Lab 08/01/14 1809 08/02/14 0044 08/02/14 0646  TROPONINI <0.03 <0.03 <0.03   BNP (last 3 results) No results for input(s): PROBNP in the last 8760 hours.   CBG: No results for input(s): GLUCAP in the last 168 hours.  Recent Results (from the past 240 hour(s))  Clostridium Difficile by PCR     Status: None   Collection Time: 08/02/14  9:50 AM  Result Value Ref Range Status   C difficile by pcr NEGATIVE NEGATIVE Final    Comment: Performed at [facility]     Studies: Dg Chest 2 View  08/01/2014   CLINICAL DATA:  Diarrhea.  EXAM: CHEST  2 VIEW  COMPARISON:  July 18, 2014.  FINDINGS: Stable cardiomediastinal silhouette. No pneumothorax or pleural effusion is noted. Right internal jugular Port-A-Cath is noted with distal tip in expected position of the SVC. No acute  pulmonary disease is noted. Status post kyphoplasty of lower thoracic vertebral body.  IMPRESSION: No acute cardiopulmonary abnormality seen.   Electronically Signed   By: Sabino Dick M.D.   On: 08/01/2014 14:15   Dg Cervical Spine Complete  07/18/2014   CLINICAL DATA:  Low back pain, left neck pain after lifting mattress yesterday. History of multiple myeloma.  EXAM: CERVICAL SPINE  4+ VIEWS  COMPARISON:  None.  FINDINGS: Degenerative disc disease changes from C4-5 thru C6-7. Degenerative facet disease, left greater than right with slight anterolisthesis of C4 on C5. No fracture. Mild bilateral neural foraminal narrowing at C5-6 due to uncovertebral spurring and facet disease. Prevertebral soft tissues are normal.  IMPRESSION: Degenerative disc and facet disease as above. No acute bony abnormality.   Electronically Signed   By: Rolm Baptise M.D.   On: 07/18/2014 11:41   Dg Thoracic Spine 2 View  07/18/2014   CLINICAL DATA:  Severe back pain. Fall yesterday. History of multiple  myeloma.  EXAM: THORACIC SPINE - 2 VIEW  COMPARISON:  Bone survey 02/05/2014  FINDINGS: Right jugular Port-A-Cath is noted. Mild thoracic levoscoliosis is noted. There is a new, mild T12 superior endplate compression fracture. There is no listhesis although the cervicothoracic junction is partially obscured by overlapping shoulder tissues. Mild multilevel thoracic endplate osteophytosis is noted. No lytic or blastic osseous lesion is identified. Thoracic aortic calcification is noted. Right upper quadrant abdominal surgical clips.  IMPRESSION: New, mild T12 compression fracture.   Electronically Signed   By: Logan Bores   On: 07/18/2014 11:30   Dg Lumbar Spine Complete  07/18/2014   CLINICAL DATA:  Low back pain since an injury 07/17/2014 after lifting injury. History of multiple myeloma.  EXAM: LUMBAR SPINE - COMPLETE 4+ VIEW  COMPARISON:  Osseous survey 04/19/2013.  FINDINGS: No fracture is identified. Trace anterolisthesis L4 on  L5 is noted. Intervertebral disc space height is maintained. Aortic atherosclerosis is noted.  IMPRESSION: Negative for fracture. No acute finding. Stable compared to prior exam.   Electronically Signed   By: Inge Rise M.D.   On: 07/18/2014 11:37   Dg Abd 1 View  08/01/2014   CLINICAL DATA:  Diarrhea.  History of multiple myeloma.  EXAM: ABDOMEN - 1 VIEW  COMPARISON:  None.  FINDINGS: The bowel gas pattern is normal. No radio-opaque calculi or other significant radiographic abnormality are seen.  IMPRESSION: Negative.   Electronically Signed   By: Kerby Moors M.D.   On: 08/01/2014 18:57   Mr Thoracic Spine Wo Contrast  07/24/2014   CLINICAL DATA:  Increasing back pain for 1 week after lifting injury. Multiple myeloma.  EXAM: MRI THORACIC SPINE WITHOUT CONTRAST  TECHNIQUE: Multiplanar, multisequence MR imaging of the thoracic spine was performed. No intravenous contrast was administered.  COMPARISON:  Radiographs dated 07/18/2014  FINDINGS: There is a subacute compression fracture of the superior endplate of K99. There is minimal protrusion of the posterior superior aspect of the T12 vertebra into the spinal canal with no neural impingement. This extends approximately 3 mm. No disc protrusion or bulging. The appearance is typical for benign osteoporotic compression fracture. There is a 5 mm lesion in the anterior left central aspect of T2 and an 8 mm lesion in the central left aspect of T6. These lesions are indeterminate in etiology. They are not appreciable on the prior CT scan of the chest dated 06/08/2005. They are not typical for hemangiomas.  There is scarring in both lung apices in there are tiny bilateral pleural effusions.  The thoracic spinal cord appears normal. No foraminal or spinal stenosis.  IMPRESSION: 1. Slight benign appearing compression fracture of T12, subacute. 2. Small lesions in T2 and T6 which are indeterminate in etiology.   Electronically Signed   By: Rozetta Nunnery M.D.   On:  07/24/2014 16:23   Ir Kypho Vertebral Thoracic Augmentation  07/31/2014   CLINICAL DATA:  Severe thoracolumbar pain secondary to compression fracture at T12.  EXAM: KYPHOPLASTY AT T12  PROCEDURE: Following a full explanation of the procedure along with the potentially associated complications, an informed witnessed consent was obtained.  The patient was placed prone on the fluoroscopic table. The skin overlying the thoracolumbar region was then prepped and draped in the usual sterile fashion. Both pedicles at T12 were then infiltrated with 0.25% bupivacaine followed by the advancement of an 11-gauge Jamshidi needle through both pedicles into the posterior one-third at T12. This was then exchanged for a Kyphon advanced osteo introducer  system comprised of a working cannula and a Engineer, manufacturing systems.  This combination was then advanced over a Kyphon osteo bone pin until the tip of the Kyphon osteo drill was in the posterior third at T12 bilaterally.  At this time, the bone pin was removed. In a medial trajectory, the combination was advanced until the tip of the working cannula was inside the posterior one-third bilaterally at T2.  The osteo drill was removed and a core sample sent for pathologic analysis.  Through the working cannula, a Kyphon bone biopsy device was advanced to within 5 mm of the anterior aspect of T12. A core sample from this was also sent for pathologic analysis.  Through the working cannula, a Kyphon inflatable bone tamp 15 x 3 was advanced and positioned with the distal marker 5 mm from the anterior aspect of T2 through both pedicles. Crossing of the midline was seen on the AP projection. At this time, the balloons were expanded using contrast via a Kyphon inflation syringe device via microtubing.  Inflations were continued until there was apposition with the superior and the inferior endplates bilaterally.  At this time, methylmethacrylate mixture was reconstituted with Tobramycin in the  Kyphon bone mixing device system. This was then loaded onto the Kyphon bone fillers.  The balloons were deflated and removed followed by the instillation of 2 bone filler equivalents of methylmethacrylate mixture at each pedicle with excellent filling in the AP and lateral projections. No extravasation was noted in the disk spaces or posteriorly into the spinal canal. No epidural venous contamination was seen.  The patient tolerated the procedure well. There were no acute complications. The working cannula and the bone fillers were then retrieved and removed.  Medications utilized: Versed 3.5 mg IV and Fentanyl 150 micrograms IV. Dilaudid 1 mg IV.  Total Moderate Sedation Time:  30 minutes.  IMPRESSION: 1. Status post fluoroscopic-guided needle placement for deep core bone biopsy at T12  2. Status post vertebral body augmentation using balloon kyphoplasty at T12 as described without event.   Electronically Signed   By: Luanne Bras M.D.   On: 07/31/2014 13:22    Scheduled Meds: . aspirin  325 mg Oral Daily  . enoxaparin (LOVENOX) injection  40 mg Subcutaneous Q24H  . gabapentin  300 mg Oral TID  . levothyroxine  50 mcg Oral QAC breakfast  . senna-docusate  1 tablet Oral BID  . sodium chloride  3 mL Intravenous Q12H   Continuous Infusions: . 0.9 % sodium chloride with kcl 75 mL/hr at 08/03/14 0600    Active Problems:   Dehydration, moderate    Time spent: 40 minutes   [facility] Pager [phone]. If 7PM-7AM, please contact night-coverage at [email or web address], password [facility] 08/03/2014, 11:57 AM  LOS: 2 days

## 2014-08-03 NOTE — Progress Notes (Signed)
Clinical Social Work Department BRIEF PSYCHOSOCIAL ASSESSMENT 08/03/2014  Patient:  Natasha Jackson, Natasha Jackson     Account Number:  [id number]     Admit date:  08/01/2014  Clinical Social Worker:  Renold Genta  Date/Time:  08/03/2014 03:15 PM  Referred by:  Physician  Date Referred:  08/03/2014 Referred for  SNF Placement   Other Referral:   Interview type:  Patient Other interview type:   and daughter, April at bedside    PSYCHOSOCIAL DATA Living Status:  HUSBAND Admitted from facility:   Level of care:   Primary support name:  April Selinda Eon (daughter) c#: [phone] Primary support relationship to patient:  CHILD, ADULT Degree of support available:   good    CURRENT CONCERNS Current Concerns  Post-Acute Placement   Other Concerns:    SOCIAL WORK ASSESSMENT / PLAN CSW reviewed PT/OT visits - anticipating possible SNF placement.   Assessment/plan status:  Information/Referral to [employer] Other assessment/ plan:   Information/referral to community resources:   CSW completed FL2 and faxed information out to [facility] - provided patient & daughter with bed offers.    PATIENT'S/FAMILY'S RESPONSE TO PLAN OF CARE: Patient at first declined SNF stating that she would prefer to return home to her husband but after discussing more with the daughter & CSW, patient inquired about going to SNF along with her husband. CSW informed patient that with her & her husband's insurance that they have a 30 day window from the date of discharge that they could go to SNF. Patient informed CSW that her husband recently had a 3 night stay in the hospital but discharged home. CSW will await PT/OT evalaution incase SNF is recommended. Patient states that if she does need SNF that she would prefer [facility] [place] alerted Arnold at [facility].          Raynaldo Opitz, [facility] Clinical Social Worker cell #: [phone]

## 2014-08-03 NOTE — Telephone Encounter (Signed)
s.w. pt grandtr and advised on Feb appt....Marland Kitchenok and aware

## 2014-08-03 NOTE — Progress Notes (Signed)
OT Cancellation Note  Patient Details Name: Natasha Jackson MRN: [id number] DOB: Jun 22, 1926   Cancelled Treatment:    Reason Eval/Treat Not Completed: Pain limiting ability to participate.  Will check another day.  Ena Demary 08/03/2014, 2:51 PM  Lesle Chris, OTR/L [phone] 08/03/2014

## 2014-08-04 LAB — COMPREHENSIVE METABOLIC PANEL
ALK PHOS: 53 U/L (ref 39–117)
ALT: 14 U/L (ref 0–35)
ANION GAP: 6 (ref 5–15)
AST: 19 U/L (ref 0–37)
Albumin: 2.9 g/dL — ABNORMAL LOW (ref 3.5–5.2)
BUN: 5 mg/dL — AB (ref 6–23)
CALCIUM: 7.9 mg/dL — AB (ref 8.4–10.5)
CHLORIDE: 104 mmol/L (ref 96–112)
CO2: 27 mmol/L (ref 19–32)
Creatinine, Ser: 0.59 mg/dL (ref 0.50–1.10)
GFR calc Af Amer: >90 mL/min (ref >90)
GFR, EST NON AFRICAN AMERICAN: 81 mL/min — AB (ref >90)
Glucose, Bld: 93 mg/dL (ref 70–99)
POTASSIUM: 3.6 mmol/L (ref 3.5–5.1)
Sodium: 137 mmol/L (ref 135–145)
TOTAL PROTEIN: 5.7 g/dL — AB (ref 6.0–8.3)
Total Bilirubin: 0.6 mg/dL (ref 0.3–1.2)

## 2014-08-04 LAB — MAGNESIUM: Magnesium: 1.6 mg/dL (ref 1.5–2.5)

## 2014-08-04 MED ORDER — BENZONATATE 100 MG PO CAPS
100.0000 mg | ORAL_CAPSULE | Freq: Three times a day (TID) | ORAL | Status: DC | PRN
  Administered 2014-08-04 (×2): 100 mg via ORAL
  Filled 2014-08-04 (×2): qty 1

## 2014-08-04 NOTE — Evaluation (Signed)
Physical Therapy Evaluation Patient Details Name: Natasha Jackson MRN: [id number] DOB: 01/04/1926 Today's Date: 08/04/2014   History of Present Illness  81 year old female with a history of multiple myeloma, breast cancer, actively undergoing chemotherapy, last chemotherapy was on 1/21, presenting with nausea and diarrhea for the last 1-1/2 days, 4-5 loose stools, weakness, inability to go about her activities of daily living. The patient had a mechanical fall 2 weeks ago and had a T12 fracture, she status post Vertebroplasty 1/26.  Clinical Impression  *Pt ambulated 400' with RW independently. She is independent with mobility using RW, no further acute PT indicated. Encouraged pt to walk in halls 2-3x day. Pt inquired about using exercise equipment at home. Pt was instructed to discuss this at her post vertebroplasty follow up visit with MD, but to walk frequently until then. PT signing off. **    Follow Up Recommendations No PT follow up    Equipment Recommendations  None recommended by PT    Recommendations for Other Services OT consult     Precautions / Restrictions Precautions Precautions: Back Precaution Comments: recent T12 vertebroplasty 07/31/14 Restrictions Weight Bearing Restrictions: No      Mobility  Bed Mobility Overal bed mobility: Modified Independent             General bed mobility comments: log roll, used rail  Transfers Overall transfer level: Modified independent Equipment used: Rolling walker (2 wheeled)                Ambulation/Gait Ambulation/Gait assistance: Modified independent (Device/Increase time) Ambulation Distance (Feet): 400 Feet Assistive device: Rolling walker (2 wheeled) Gait Pattern/deviations: WFL(Within Functional Limits)   Gait velocity interpretation: at or above normal speed for age/gender General Gait Details: steady, no LOB  Stairs            Wheelchair Mobility    Modified Rankin (Stroke Patients Only)        Balance Overall balance assessment: Modified Independent                                           Pertinent Vitals/Pain Pain Assessment: 0-10 Pain Score: 1  Pain Location: back Pain Descriptors / Indicators: Sore Pain Intervention(s): RN gave pain meds during session;Monitored during session    Home Living Family/patient expects to be discharged to:: Private residence Living Arrangements: Spouse/significant other Available Help at Discharge: Family;Available 24 hours/day Type of Home: House Home Access: Stairs to enter Entrance Stairs-Rails: None Entrance Stairs-Number of Steps: 3 Home Layout: One level Home Equipment: Environmental consultant - 2 wheels Additional Comments: plans to hire aide to help with cooking/cleaning    Prior Function Level of Independence: Independent               Hand Dominance        Extremity/Trunk Assessment   Upper Extremity Assessment: Overall WFL for tasks assessed           Lower Extremity Assessment: Overall WFL for tasks assessed      Cervical / Trunk Assessment: Normal  Communication   Communication: No difficulties  Cognition Arousal/Alertness: Awake/alert Behavior During Therapy: WFL for tasks assessed/performed Overall Cognitive Status: Within Functional Limits for tasks assessed                      General Comments      Exercises  Assessment/Plan    PT Assessment Patent does not need any further PT services  PT Diagnosis Acute pain   PT Problem List    PT Treatment Interventions     PT Goals (Current goals can be found in the Care Plan section) Acute Rehab PT Goals Patient Stated Goal: to be able to work out PT Goal Formulation: All assessment and education complete, DC therapy    Frequency     Barriers to discharge        Co-evaluation               End of Session Equipment Utilized During Treatment: Gait belt Activity Tolerance: Patient tolerated treatment  well Patient left: in bed;with call bell/phone within reach Nurse Communication: Mobility status         Time: 9407-6808 PT Time Calculation (min) (ACUTE ONLY): 19 min   Charges:   PT Evaluation $Initial PT Evaluation Tier I: 1 Procedure     PT G CodesBlondell Reveal Jackson 08/04/2014, 11:06 AM [phone]

## 2014-08-04 NOTE — Progress Notes (Signed)
Patient ID: Natasha Jackson, female   DOB: 1925-09-15, 81 y.o.   MRN: [id number]  [facility] PROGRESS NOTE  TERRIN IMPARATO XBW:[id number] DOB: 07-23-1925 DOA: 08/01/2014 PCP: Thressa Sheller, MD   Brief narrative:    81 year old female with a history of multiple myeloma, breast cancer, actively undergoing chemotherapy, last chemotherapy was on 1/21, presented with nausea and diarrhea for the last 1-1/2 days, 4-5 loose stools, weakness, inability to go about her activities of daily living. The patient had a mechanical fall 2 weeks PTA and had a T12 fracture, she is status post Vertebroplasty 1/26. Patient has had problems with pain regimen including Dilaudid, oxycodone. Dilaudid caused her to get constipated for which the patient had to take stool softeners, miralax from which she ended up having diarrhea. Patient and family are not sure if the patient is having side effects from the chemotherapy. Over the weekend the patient also had a low-grade fever.  Patient afebrile in the ED, normotensive, blood work revealed leukopenia, hypokalemia, and she was clinically dehydrated.   Assessment/Plan:   Weakness and dehydration - Multifactorial secondary to recent chemotherapy, diarrhea resulting in hypokalemia, recent fall with T12 fracture - Strength is improving, patient ambulating, PT recommending HH - pt to be d/c in next 24 hours   Hypokalemia - supplemented and WNL this AM - Mg is WNL  Diarrhea - C. Diff negative, could be from stool softeners vs chemo side effect - now resolved and actually had no BM over the past 24 hours  - KUB negative on admission  Sinus tachycardia - Likely secondary to uncontrolled pain - no PE on CT angio chest  - now resolved - d/c telemetry   Back pain  - in the setting of recent T12 fracture, abdominal pain  - better controlled with current analgesia   Multiple myeloma  -Follow follow-up with oncology in 3 weeks - She is on Elotuzumab and Revlimid,  last dose on 1/21 - Her treatment is now on hold due to current hospitalization  Anemia in neoplastic disease - This is likely anemia of chronic disease, chemotherapy 1/21 - Currently asymptomatic from anemia - Stable hemoglobin  Leukopenia - Due to chemotherapy, oncology following  Vertebral compression fracture - status post vertebroplasty - tylenol and tramadol for pain control - recently received zometa   Lovenox SQ for DVT prophylaxis   Code Status: Full.  Family Communication:  plan of care discussed with the patient Disposition Plan: Home in AM  IV access:  Peripheral IV Procedures and diagnostic studies:    Dg Chest 2 View  08/01/2014   No acute cardiopulmonary abnormality seen.    Dg Cervical Spine 07/18/2014 Degenerative disc and facet disease as above. No acute bony abnormality.     Dg Thoracic Spine 2 View  07/18/2014  New, mild T12 compression fracture.     Dg Lumbar Spine 07/18/2014  Negative for fracture. No acute finding. Stable compared to prior exam.     Dg Abd 1 View  08/01/2014  Negative.    Ct Angio Chest 08/03/2014   No evidence of pulmonary embolism.  Small  pleural effusions. 3 mm LUL module, min different from 2006.  Mr Thoracic  07/24/2014 Slight benign appearing compression fx of T12.  Small lesions in T2 and T6, indeterminate in etiology.     Ir Kypho Vertebral Thoracic Augmentation  07/31/2014  S/P  vertebral body augmentation using balloon kyphoplasty at T12  Medical Consultants:  None Other Consultants:  PT/OT IAnti-Infectives:   None  Faye Ramsay, MD  [facility] Pager [phone]  If 7PM-7AM, please contact night-coverage [email or web address] Password [email or web address] 08/04/2014, 3:30 PM   LOS: 3 days   HPI/Subjective: No events overnight.   Objective: Filed Vitals:   08/03/14 1729 08/03/14 2200 08/04/14 0433 08/04/14 1444  BP: 137/52 134/47 144/42 142/48  Pulse: 66 57 56 64  Temp: 98 F (36.7 C) 98 F (36.7 C) 97.8 F (36.6 C) 97.8 F (36.6 C)   TempSrc: Oral Oral Oral Oral  Resp: 16 20 16 18   Height:      Weight:      SpO2: 100% 97% 97% 100%    Intake/Output Summary (Last 24 hours) at 08/04/14 1530 Last data filed at 08/04/14 1444  Gross per 24 hour  Intake 1149.17 ml  Output      0 ml  Net 1149.17 ml    Exam:   General:  Pt is alert, follows commands appropriately, not in acute distress  Cardiovascular: Regular rate and rhythm, no rubs, no gallops  Respiratory: Clear to auscultation bilaterally, no wheezing, no crackles, no rhonchi  Abdomen: Soft, non tender, non distended, bowel sounds present, no guarding   Data Reviewed: Basic Metabolic Panel:  Recent Labs Lab 07/31/14 0807 08/01/14 1222 08/01/14 1809 08/02/14 0646 08/03/14 0915 08/04/14 0556  NA 136 136  --  137 135 137  K 3.8 3.3*  --  3.3* 4.1 3.6  CL 104 103  --  105 106 104  CO2 24 24  --  23 23 27   GLUCOSE 91 100*  --  71 96 93  BUN 8 5*  --  7 5* 5*  CREATININE 0.66 0.60 0.54 0.56 0.54 0.59  CALCIUM 8.1* 8.0*  --  7.5* 7.7* 7.9*  MG  --  1.8  --   --   --  1.6   Liver Function Tests:  Recent Labs Lab 08/01/14 1222 08/02/14 0646 08/03/14 0915 08/04/14 0556  AST 23 19 22 19   ALT 15 13 14 14   ALKPHOS 66 55 56 53  BILITOT 0.8 0.8 0.6 0.6  PROT 6.4 5.7* 6.1 5.7*  ALBUMIN 3.1* 2.9* 3.2* 2.9*    Recent Labs Lab 08/01/14 1810  LIPASE 21   CBC:  Recent Labs Lab 07/31/14 0807 08/01/14 1222 08/01/14 1809 08/02/14 0646  WBC 3.3* 2.6* 2.6* 2.7*  NEUTROABS 2.3 2.1  --   --   HGB 10.1* 10.2* 10.0* 9.3*  HCT 30.0* 30.7* 29.2* 27.6*  MCV 96.8 99.0 98.0 98.6  PLT 294 277 263 260   Cardiac Enzymes:  Recent Labs Lab 08/01/14 1809 08/02/14 0044 08/02/14 0646  TROPONINI <0.03 <0.03 <0.03    Recent Results (from the past 240 hour(s))  Clostridium Difficile by PCR     Status: None   Collection Time: 08/02/14  9:50 AM  Result Value Ref Range Status   C difficile by pcr NEGATIVE NEGATIVE Final    Comment: Performed at  [facility]     Scheduled Meds: . aspirin  325 mg Oral Daily  . enoxaparin (LOVENOX) injection  40 mg Subcutaneous Q24H  . gabapentin  300 mg Oral TID  . levothyroxine  50 mcg Oral QAC breakfast  . sodium chloride  3 mL Intravenous Q12H   Continuous Infusions: . 0.9 % NaCl with KCl 20 mEq / L 50 mL/hr at 08/04/14 1236

## 2014-08-04 NOTE — Clinical Social Work Note (Signed)
CSW reviewed PT evaluation which reflected no PT follow up  No further CSW needs  CSW signing off  .Dede Query, LCSW [facility] Clinical Social Worker - Weekend Coverage cell #: [phone]

## 2014-08-04 NOTE — Evaluation (Signed)
Occupational Therapy Evaluation Patient Details Name: Natasha Jackson MRN: [id number] DOB: July 05, 1926 Today's Date: 08/04/2014    History of Present Illness 81 year old female with a history of multiple myeloma, breast cancer, actively undergoing chemotherapy, last chemotherapy was on 1/21, presenting with nausea and diarrhea for the last 1-1/2 days, 4-5 loose stools, weakness, inability to go about her activities of daily living. The patient had a mechanical fall 2 weeks ago and had a T12 fracture, she status post Vertebroplasty 1/26.   Clinical Impression   Pt up in bathroom with nursing when OT arrived. Educated pt on back precautions related to ADL and safety with walker use. Pt overall at supervision level but would benefit from 1 additional session if possible, to reinforce education. Will follow.    Follow Up Recommendations  No OT follow up;Supervision/Assistance - 24 hour    Equipment Recommendations  None recommended by OT    Recommendations for Other Services       Precautions / Restrictions Precautions Precautions: Back Precaution Comments: recent T12 vertebroplasty 07/31/14 Restrictions Weight Bearing Restrictions: No      Mobility Bed Mobility Overal bed mobility: Modified Independent             General bed mobility comments: log roll, used rail  Transfers Overall transfer level: Needs assistance Equipment used: Rolling walker (2 wheeled) Transfers: Sit to/from Stand Sit to Stand: Supervision              Balance                                            ADL Overall ADL's : Needs assistance/impaired Eating/Feeding: Independent;Sitting   Grooming: Wash/dry hands;Supervision/safety;Standing   Upper Body Bathing: Set up;Sitting   Lower Body Bathing: Supervison/ safety;Sit to/from stand   Upper Body Dressing : Set up;Sitting   Lower Body Dressing: Supervision/safety;Sit to/from stand   Toilet Transfer:  Supervision/safety;Ambulation;RW   Toileting- Clothing Manipulation and Hygiene: Supervision/safety;Sit to/from stand         General ADL Comments: educated pt on back precautions for mobility /ADL. Pt standing at sink upon OT arrival with nursing in room already. Pt with the walker at the side of the sink and pt twisting around to reach for items. explained that it is better if she keeps walker in front of her and stays inside of it at the sink and avoids twisting motion but rather reach straight ahead of her. Also educated on not bending and arching. Demonstrated to cross LEs up to don Depends rather than bending forward. Pt with loose stool and nursing was assisting wtih cleanup. Pt stating she needed to rest for awhile so assisted pt back to bed. Discussed a shower seat for safety and pt states she can likely borrow one.      Vision                     Perception     Praxis      Pertinent Vitals/Pain Pain Assessment: No/denies pain     Hand Dominance     Extremity/Trunk Assessment Upper Extremity Assessment Upper Extremity Assessment: Overall WFL for tasks assessed           Communication Communication Communication: No difficulties   Cognition Arousal/Alertness: Awake/alert Behavior During Therapy: WFL for tasks assessed/performed Overall Cognitive Status: Within Functional Limits for tasks assessed  General Comments       Exercises       Shoulder Instructions      Home Living Family/patient expects to be discharged to:: Private residence Living Arrangements: Spouse/significant other Available Help at Discharge: Family;Available 24 hours/day Type of Home: House Home Access: Stairs to enter [employer] of Steps: 3 Entrance Stairs-Rails: None Home Layout: One level     Bathroom Shower/Tub: Occupational psychologist: Standard     Home Equipment: Environmental consultant - 2 wheels;Grab bars - toilet   Additional  Comments: plans to hire aide to help with cooking/cleaning; states she can likely get a shower seat.      Prior Functioning/Environment Level of Independence: Independent             OT Diagnosis: Generalized weakness   OT Problem List: Decreased knowledge of use of DME or AE;Decreased strength   OT Treatment/Interventions: Self-care/ADL training;Patient/family education;Therapeutic activities;DME and/or AE instruction    OT Goals(Current goals can be found in the care plan section) Acute Rehab OT Goals Patient Stated Goal: return to independence OT Goal Formulation: With patient Time For Goal Achievement: 08/18/14 Potential to Achieve Goals: Good  OT Frequency: Min 2X/week   Barriers to D/C:            Co-evaluation              End of Session Equipment Utilized During Treatment: Rolling walker  Activity Tolerance: Patient tolerated treatment well Patient left: in bed;with call bell/phone within reach   Time: 8166-1969 OT Time Calculation (min): 16 min Charges:  OT General Charges $OT Visit: 1 Procedure OT Evaluation $Initial OT Evaluation Tier I: 1 Procedure G-Codes:    Jules Schick  [phone] 08/04/2014, 4:14 PM

## 2014-08-05 DIAGNOSIS — M129 Arthropathy, unspecified: Secondary | ICD-10-CM

## 2014-08-05 LAB — CBC
HEMATOCRIT: 28.6 % — AB (ref 36.0–46.0)
Hemoglobin: 9.7 g/dL — ABNORMAL LOW (ref 12.0–15.0)
MCH: 33 pg (ref 26.0–34.0)
MCHC: 33.9 g/dL (ref 30.0–36.0)
MCV: 97.3 fL (ref 78.0–100.0)
Platelets: 235 10*3/uL (ref 150–400)
RBC: 2.94 MIL/uL — AB (ref 3.87–5.11)
RDW: 14 % (ref 11.5–15.5)
WBC: 2.9 10*3/uL — ABNORMAL LOW (ref 4.0–10.5)

## 2014-08-05 LAB — BASIC METABOLIC PANEL
ANION GAP: 7 (ref 5–15)
BUN: 5 mg/dL — AB (ref 6–23)
CHLORIDE: 104 mmol/L (ref 96–112)
CO2: 27 mmol/L (ref 19–32)
CREATININE: 0.56 mg/dL (ref 0.50–1.10)
Calcium: 8.2 mg/dL — ABNORMAL LOW (ref 8.4–10.5)
GFR calc non Af Amer: 82 mL/min — ABNORMAL LOW (ref >90)
Glucose, Bld: 95 mg/dL (ref 70–99)
POTASSIUM: 3.6 mmol/L (ref 3.5–5.1)
SODIUM: 138 mmol/L (ref 135–145)

## 2014-08-05 MED ORDER — TRAMADOL HCL 50 MG PO TABS: 50.0000 mg | ORAL_TABLET | Freq: Four times a day (QID) | ORAL | Status: DC | PRN

## 2014-08-05 MED ORDER — OXYCODONE HCL 5 MG PO TABS: 5.0000 mg | ORAL_TABLET | Freq: Four times a day (QID) | ORAL | Status: DC | PRN

## 2014-08-05 NOTE — Care Management Note (Signed)
CARE MANAGEMENT NOTE 08/05/2014  Patient:  [facility] R   Account Number:  [id number]  Date Initiated:  08/03/2014  Documentation initiated by:  Dessa Phi  Subjective/Objective Assessment:   81 y/o f admitted w/Dehydration.     Action/Plan:   From home.   Anticipated DC Date:  08/06/2014   Anticipated DC Plan:  [facility]  CM consult      [facility] Choice  HOME HEALTH   Choice offered to / List presented to:  C-1 Patient        [place] arranged  [place] [facility]      [place].   Status of service:  Completed, signed off Medicare Important Message given?  YES (If response is "NO", the following Medicare IM given date fields will be blank) Date Medicare IM given:  08/03/2014 Medicare IM given by:  [facility] Date Additional Medicare IM given:   Additional Medicare IM given by:    Discharge Disposition:  [place]  Per UR Regulation:  Reviewed for med. necessity/level of care/duration of stay  If discussed at [place] of Stay Meetings, dates discussed:    Comments:  08/05/14 1120 CM spoke with patient. She selected [facility] for home care. Stephanie at [facility] notified of referral and discharge for today. Venita Sheffield RN BSN CCM [phone]  08/03/14 Dessa Phi RN BSN NCM  [phone] Await PT recommendations.CSW already following.

## 2014-08-05 NOTE — Discharge Instructions (Signed)

## 2014-08-05 NOTE — Discharge Summary (Signed)
Physician Discharge Summary  CHELSEA PEDRETTI VVZ:[id number] DOB: [birth year] DOA: 08/01/2014  PCP: Thressa Sheller, MD  Admit date: 08/01/2014 Discharge date: 08/05/2014  Recommendations for Outpatient Follow-up:  1. Pt will need to follow up with PCP in 2-3 weeks post discharge 2. Please obtain BMP to evaluate electrolytes and kidney function 3. Please also check CBC to evaluate Hg and Hct levels  Discharge Diagnoses:  Active Problems:   Dehydration, moderate  Discharge Condition: Stable  Diet recommendation: Heart healthy diet discussed in details   Brief narrative:    81 year old female with a history of multiple myeloma, breast cancer, actively undergoing chemotherapy, last chemotherapy was on 1/21, presented with nausea and diarrhea for the last 1-1/2 days, 4-5 loose stools, weakness, inability to go about her activities of daily living. The patient had a mechanical fall 2 weeks PTA and had a T12 fracture, she is status post Vertebroplasty 1/26. Patient has had problems with pain regimen including Dilaudid, oxycodone. Dilaudid caused her to get constipated for which the patient had to take stool softeners, miralax from which she ended up having diarrhea. Patient and family are not sure if the patient is having side effects from the chemotherapy. Over the weekend the patient also had a low-grade fever.  Patient afebrile in the ED, normotensive, blood work revealed leukopenia, hypokalemia, and she was clinically dehydrated.   Assessment/Plan:   Weakness and dehydration - Multifactorial secondary to recent chemotherapy, diarrhea resulting in hypokalemia, recent fall with T12 fracture - Strength is improving, patient ambulating, PT recommending HH  Hypokalemia - supplemented and WNL this AM - Mg is WNL  Diarrhea - C. Diff negative, could be from stool softeners vs chemo side effect - now resolved - KUB negative on admission  Sinus tachycardia - Likely secondary to  uncontrolled pain - no PE on CT angio chest  - now resolved  Back pain  - in the setting of recent T12 fracture, abdominal pain  - better controlled with current analgesia   Multiple myeloma  -Follow follow-up with oncology in 3 weeks - She is on Elotuzumab and Revlimid, last dose on 1/21 - Her treatment is now on hold due to current hospitalization  Anemia in neoplastic disease - This is likely anemia of chronic disease, chemotherapy 1/21 - Currently asymptomatic from anemia - Stable hemoglobin  Leukopenia - Due to chemotherapy, blood counts stable   Vertebral compression fracture - status post vertebroplasty - recently received zometa   Severe PCM - in the context of chronic illness - alb 2.9 - tolerating regular diet well but still with poor oral intake   Code Status: Full.  Family Communication: plan of care discussed with the patient Disposition Plan: Home   IV access:  Peripheral IV Procedures and diagnostic studies:   Dg Chest 2 View 08/01/2014 No acute cardiopulmonary abnormality seen.   Dg Cervical Spine 07/18/2014 Degenerative disc and facet disease as above. No acute bony abnormality.   Dg Thoracic Spine 2 View 07/18/2014 New, mild T12 compression fracture.   Dg Lumbar Spine 07/18/2014 Negative for fracture. No acute finding. Stable compared to prior exam.   Dg Abd 1 View 08/01/2014 Negative.   Ct Angio Chest 08/03/2014 No evidence of pulmonary embolism. Small pleural effusions. 3 mm LUL module, min different from 2006.  Mr Thoracic 07/24/2014 Slight benign appearing compression fx of T12. Small lesions in T2 and T6, indeterminate in etiology.   Ir Kypho Vertebral Thoracic Augmentation 07/31/2014 S/P vertebral body augmentation using balloon kyphoplasty at T12  Medical Consultants:  None Other Consultants:  PT/OT IAnti-Infectives:   None   Discharge Exam: Filed Vitals:   08/05/14 0603  BP: 141/56  Pulse: 60   Temp: 98.2 F (36.8 C)  Resp: 20   Filed Vitals:   08/04/14 0433 08/04/14 1444 08/04/14 2114 08/05/14 0603  BP: 144/42 142/48 142/63 141/56  Pulse: 56 64 63 60  Temp: 97.8 F (36.6 C) 97.8 F (36.6 C) 98.2 F (36.8 C) 98.2 F (36.8 C)  TempSrc: Oral Oral Oral Oral  Resp: _0 Height:      Weight:      SpO2: 97% 100% 97% 94%    General: Pt is alert, follows commands appropriately, not in acute distress Cardiovascular: Regular rate and rhythm, no rubs, no gallops Respiratory: Clear to auscultation bilaterally, no wheezing, no crackles, no rhonchi Abdominal: Soft, non tender, non distended, bowel sounds +, no guarding Extremities: no edema, no cyanosis, pulses palpable bilaterally DP and PT Neuro: Grossly nonfocal  Discharge Instructions  Discharge Instructions    Diet - low sodium heart healthy    Complete by:  As directed      Increase activity slowly    Complete by:  As directed             Medication List    STOP taking these medications        HYDROmorphone 4 MG tablet  Commonly known as:  DILAUDID      TAKE these medications        acetaminophen 325 MG tablet  Commonly known as:  TYLENOL  Take 650 mg by mouth every 6 (six) hours as needed (Pain).     aspirin 325 MG EC tablet  Take 325 mg by mouth daily.     cholecalciferol 1000 UNITS tablet  Commonly known as:  VITAMIN D  Take 1,000 Units by mouth daily.     diphenhydramine-acetaminophen 25-500 MG Tabs  Commonly known as:  TYLENOL PM  Take 1 tablet by mouth at bedtime as needed (Sleep).     gabapentin 300 MG capsule  Commonly known as:  NEURONTIN  Take 1 capsule (300 mg total) by mouth 3 (three) times daily.     lenalidomide 10 MG capsule  Commonly known as:  REVLIMID  Take 1 capsule (10 mg total) by mouth daily.     levothyroxine 50 MCG tablet  Commonly known as:  SYNTHROID, LEVOTHROID  Take 50 mcg by mouth daily before breakfast.     lidocaine-prilocaine cream  Commonly known  as:  EMLA  Apply 1 application topically as needed. Apply to [facility] a cath site at least one hour prior to [employer].     loperamide 2 MG tablet  Commonly known as:  IMODIUM A-D  Take 2 mg by mouth as needed for diarrhea or loose stools (as directed.).     meloxicam 7.5 MG tablet  Commonly known as:  MOBIC  Take 7.5 mg by mouth 2 (two) times daily as needed for pain.     oxyCODONE 5 MG immediate release tablet  Commonly known as:  Oxy IR/ROXICODONE  Take 1 tablet (5 mg total) by mouth every 6 (six) hours as needed for severe pain.     PRESCRIPTION MEDICATION  Chemo at [facility]     traMADol 50 MG tablet  Commonly known as:  ULTRAM  Take 1 tablet (50 mg total) by mouth every 6 (six) hours as needed for moderate pain.     valsartan 160  MG tablet  Commonly known as:  DIOVAN  TAKE ONE TABLET BY MOUTH ONE TIME DAILY           Follow-up Information    Follow up with Thressa Sheller, MD.   Specialty:  Internal Medicine   Contact information:   [address] [place] [address] [phone]       Follow up with Faye Ramsay, MD.   Specialty:  Internal Medicine   Why:  As needed, If symptoms worsen   Contact information:   [address] [place] [address] [phone]        The results of significant diagnostics from this hospitalization (including imaging, microbiology, ancillary and laboratory) are listed below for reference.     Microbiology: Recent Results (from the past 240 hour(s))  Clostridium Difficile by PCR     Status: None   Collection Time: 08/02/14  9:50 AM  Result Value Ref Range Status   C difficile by pcr NEGATIVE NEGATIVE Final    Comment: Performed at [place]: Basic Metabolic Panel:  Recent Labs Lab 08/01/14 1222 08/01/14 1809 08/02/14 0646 08/03/14 0915 08/04/14 0556 08/05/14 0609  NA 136  --  137 135 137 138  K 3.3*  --  3.3* 4.1 3.6 3.6  CL 103  --  105 106 104 104   CO2 24  --  _0 GLUCOSE 100*  --  71 96 93 95  BUN 5*  --  7 5* 5* 5*  CREATININE 0.60 0.54 0.56 0.54 0.59 0.56  CALCIUM 8.0*  --  7.5* 7.7* 7.9* 8.2*  MG 1.8  --   --   --  1.6  --    Liver Function Tests:  Recent Labs Lab 08/01/14 1222 08/02/14 0646 08/03/14 0915 08/04/14 0556  AST _1 ALT _2 ALKPHOS 66 55 56 53  BILITOT 0.8 0.8 0.6 0.6  PROT 6.4 5.7* 6.1 5.7*  ALBUMIN 3.1* 2.9* 3.2* 2.9*    Recent Labs Lab 08/01/14 1810  LIPASE 21   CBC:  Recent Labs Lab 07/31/14 0807 08/01/14 1222 08/01/14 1809 08/02/14 0646 08/05/14 0609  WBC 3.3* 2.6* 2.6* 2.7* 2.9*  NEUTROABS 2.3 2.1  --   --   --   HGB 10.1* 10.2* 10.0* 9.3* 9.7*  HCT 30.0* 30.7* 29.2* 27.6* 28.6*  MCV 96.8 99.0 98.0 98.6 97.3  PLT 294 277 263 260 235   Cardiac Enzymes:  Recent Labs Lab 08/01/14 1809 08/02/14 0044 08/02/14 0646  TROPONINI <0.03 <0.03 <0.03   SIGNED: Time coordinating discharge: Over 30 minutes  Faye Ramsay, MD  [facility] 08/05/2014, 9:16 AM Pager [phone]  If 7PM-7AM, please contact night-coverage [email or web address] Password [email or web address]

## 2014-08-06 ENCOUNTER — Other Ambulatory Visit (HOSPITAL_COMMUNITY): Payer: Self-pay | Admitting: Interventional Radiology

## 2014-08-06 DIAGNOSIS — M549 Dorsalgia, unspecified: Secondary | ICD-10-CM

## 2014-08-06 DIAGNOSIS — IMO0002 Reserved for concepts with insufficient information to code with codable children: Secondary | ICD-10-CM

## 2014-08-08 ENCOUNTER — Telehealth: Payer: Self-pay | Admitting: *Deleted

## 2014-08-08 ENCOUNTER — Other Ambulatory Visit: Payer: Self-pay | Admitting: Hematology and Oncology

## 2014-08-08 DIAGNOSIS — C9000 Multiple myeloma not having achieved remission: Secondary | ICD-10-CM

## 2014-08-08 MED ORDER — LORAZEPAM 0.5 MG PO TABS: 0.5000 mg | ORAL_TABLET | Freq: Two times a day (BID) | ORAL | Status: DC | PRN

## 2014-08-08 NOTE — Telephone Encounter (Signed)
Ativan called into [facility] pharmacy.

## 2014-08-10 ENCOUNTER — Telehealth: Payer: Self-pay | Admitting: *Deleted

## 2014-08-10 NOTE — Telephone Encounter (Signed)
MAY DO VERBAL ORDER TO CHAUNDA AT [facility], [phone]. MAY LEAVE VOICE MESSAGE.

## 2014-08-10 NOTE — Telephone Encounter (Signed)
OK 

## 2014-08-10 NOTE — Telephone Encounter (Signed)
WRITTEN ORDER AND READ BACK TO [place]- PT. MAY HAVE OCCUPATIONAL THERAPY. NOTIFIED CHAUNDA.

## 2014-08-14 ENCOUNTER — Ambulatory Visit (HOSPITAL_COMMUNITY)
Admission: RE | Admit: 2014-08-14 | Discharge: 2014-08-14 | Disposition: A | Discharge status: Home / Self Care | Payer: Medicare Other | Source: Ambulatory Visit | Attending: Interventional Radiology | Admitting: Interventional Radiology

## 2014-08-14 DIAGNOSIS — M549 Dorsalgia, unspecified: Secondary | ICD-10-CM

## 2014-08-14 DIAGNOSIS — IMO0002 Reserved for concepts with insufficient information to code with codable children: Secondary | ICD-10-CM

## 2014-08-23 ENCOUNTER — Encounter: Payer: Self-pay | Admitting: Hematology and Oncology

## 2014-08-23 ENCOUNTER — Other Ambulatory Visit (HOSPITAL_BASED_OUTPATIENT_CLINIC_OR_DEPARTMENT_OTHER): Payer: Medicare Other

## 2014-08-23 ENCOUNTER — Ambulatory Visit (HOSPITAL_BASED_OUTPATIENT_CLINIC_OR_DEPARTMENT_OTHER): Payer: Medicare Other | Admitting: Hematology and Oncology

## 2014-08-23 ENCOUNTER — Telehealth: Payer: Self-pay | Admitting: Hematology and Oncology

## 2014-08-23 VITALS — BP 162/55 | HR 70 | Temp 98.0°F | Resp 18 | Ht 65.0 in | Wt 126.0 lb

## 2014-08-23 DIAGNOSIS — D630 Anemia in neoplastic disease: Secondary | ICD-10-CM

## 2014-08-23 DIAGNOSIS — M129 Arthropathy, unspecified: Secondary | ICD-10-CM

## 2014-08-23 DIAGNOSIS — C9000 Multiple myeloma not having achieved remission: Secondary | ICD-10-CM

## 2014-08-23 DIAGNOSIS — IMO0001 Reserved for inherently not codable concepts without codable children: Secondary | ICD-10-CM

## 2014-08-23 DIAGNOSIS — M4850XD Collapsed vertebra, not elsewhere classified, site unspecified, subsequent encounter for fracture with routine healing: Secondary | ICD-10-CM

## 2014-08-23 LAB — COMPREHENSIVE METABOLIC PANEL (CC13)
ALBUMIN: 3.2 g/dL — AB (ref 3.5–5.0)
ALT: 13 U/L (ref 0–55)
ANION GAP: 8 meq/L (ref 3–11)
AST: 19 U/L (ref 5–34)
Alkaline Phosphatase: 73 U/L (ref 40–150)
BILIRUBIN TOTAL: 0.31 mg/dL (ref 0.20–1.20)
BUN: 10.7 mg/dL (ref 7.0–26.0)
CO2: 27 meq/L (ref 22–29)
Calcium: 8.6 mg/dL (ref 8.4–10.4)
Chloride: 100 mEq/L (ref 98–109)
Creatinine: 0.8 mg/dL (ref 0.6–1.1)
EGFR: 72 mL/min/{1.73_m2} — ABNORMAL LOW (ref >90)
GLUCOSE: 129 mg/dL (ref 70–140)
Potassium: 3.9 mEq/L (ref 3.5–5.1)
SODIUM: 135 meq/L — AB (ref 136–145)
TOTAL PROTEIN: 6.9 g/dL (ref 6.4–8.3)

## 2014-08-23 LAB — CBC WITH DIFFERENTIAL/PLATELET
BASO%: 0.2 % (ref 0.0–2.0)
BASOS ABS: 0 10*3/uL (ref 0.0–0.1)
EOS ABS: 0 10*3/uL (ref 0.0–0.5)
EOS%: 0.8 % (ref 0.0–7.0)
HCT: 31.6 % — ABNORMAL LOW (ref 34.8–46.6)
HGB: 10.6 g/dL — ABNORMAL LOW (ref 11.6–15.9)
LYMPH#: 1.1 10*3/uL (ref 0.9–3.3)
LYMPH%: 23 % (ref 14.0–49.7)
MCH: 33 pg (ref 25.1–34.0)
MCHC: 33.5 g/dL (ref 31.5–36.0)
MCV: 98.4 fL (ref 79.5–101.0)
MONO#: 0.2 10*3/uL (ref 0.1–0.9)
MONO%: 4.4 % (ref 0.0–14.0)
NEUT%: 71.6 % (ref 38.4–76.8)
NEUTROS ABS: 3.5 10*3/uL (ref 1.5–6.5)
Platelets: 276 10*3/uL (ref 145–400)
RBC: 3.21 10*6/uL — ABNORMAL LOW (ref 3.70–5.45)
RDW: 14.4 % (ref 11.2–14.5)
WBC: 4.8 10*3/uL (ref 3.9–10.3)
nRBC: 0 % (ref 0–0)

## 2014-08-23 MED ORDER — TRAMADOL HCL 50 MG PO TABS: 50.0000 mg | ORAL_TABLET | Freq: Four times a day (QID) | ORAL | Status: DC | PRN

## 2014-08-23 NOTE — Telephone Encounter (Signed)
Gave avs & calendar for March. °

## 2014-08-24 NOTE — Assessment & Plan Note (Signed)
The patient had significant complication related to recent traumatic compression fracture. She is blaming that the infusional elotuzumab is the cause of this. I discussed with the patient that a lot of her symptoms were related to uncontrolled pain and not related to treatment. In any case, the patient is not willing to go back to treatment. She is aware that single agent Revlimid alone could bring in some partial response but not as effective as combination treatment. She will continue on Revlimid for now. She will continue Zometa every 3 months. I recommend she continues to take dexamethasone weekly and plan to taper her in the future.

## 2014-08-24 NOTE — Assessment & Plan Note (Signed)
She had kyphoplasty. Biopsy of the bone confirm multiple myeloma which does not come as a surprise. The patient refused to take pain medicine. I refilled prescription tramadol for her. In the meantime, I recommend she continue calcium with vitamin D. We will continue Zometa every 3 months, her next infusion will be due in April 2016.

## 2014-08-24 NOTE — Telephone Encounter (Signed)
none

## 2014-08-24 NOTE — Progress Notes (Signed)
[place] OFFICE PROGRESS NOTE  Patient Care Team: Thressa Sheller, MD as PCP - General (Internal Medicine) Thressa Sheller, MD (Internal Medicine) Renella Cunas, MD as Attending Physician (Cardiology) Heath Lark, MD as Consulting Physician (Hematology and Oncology) Rob Hickman, MD as Consulting Physician (Interventional Radiology)  SUMMARY OF ONCOLOGIC HISTORY: Oncology History   The     Multiple myeloma   11/12/2008 Imaging Skeletal survey showed lytic lesion in the right femur compatible with  myeloma. There were Questionable skull lesions   11/15/2008 Bone Marrow Biopsy BONE MARROW ASPIRATE AND BIOPSY: showed NORMOCELLULAR MARROW FOR AGE WITH PLASMA CELL DYSCRASIA  (PLASMA CELLS 25%). 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 Subsequently she was placed on Revlimid & Dexamethasone alone   05/12/2013 Bone Marrow Biopsy Bone marrow biopsy showed persistent plasma cells. Blood work confirmed VGPR status   06/11/2014 Tumor Marker Bloodwork show disease relapse   06/26/2014 Procedure She has placement of port   07/05/2014 - 07/26/2014 Chemotherapy She received Elotuzumab and Revlimid. Rx was discontinued with elotuzumab per patient preference   08/01/2014 - 08/05/2014 Hospital Admission She was admitted to the hospital for kyphoplasty and pain management after traumatic compression fracture    INTERVAL HISTORY: Please see below for problem oriented charting. She is seen after recent discharge from hospital for compression fracture. Overall, she is better. She continues to have back pain, currently taking tramadol for this. Denies recent infection.  REVIEW OF SYSTEMS:   Constitutional: Denies fevers, chills or abnormal weight loss Eyes: Denies blurriness of vision Ears, nose, mouth, throat, and face: Denies mucositis  or sore throat Respiratory: Denies cough, dyspnea or wheezes Cardiovascular: Denies palpitation, chest discomfort or lower extremity swelling Gastrointestinal:  Denies nausea, heartburn or change in bowel habits Skin: Denies abnormal skin rashes Lymphatics: Denies new lymphadenopathy or easy bruising Neurological:Denies numbness, tingling or new weaknesses Behavioral/Psych: Mood is stable, no new changes  All other systems were reviewed with the patient and are negative.  I have reviewed the past medical history, past surgical history, social history and family history with the patient and they are unchanged from previous note.  ALLERGIES:  has No Known Allergies.  MEDICATIONS:  Current Outpatient Prescriptions  Medication Sig Dispense Refill  . acetaminophen (TYLENOL) 325 MG tablet Take 650 mg by mouth every 6 (six) hours as needed (Pain).     [name] aspirin 325 MG EC tablet Take 325 mg by mouth daily.    . cholecalciferol (VITAMIN D) 1000 UNITS tablet Take 1,000 Units by mouth daily.    [name] gabapentin (NEURONTIN) 300 MG capsule Take 1 capsule (300 mg total) by mouth 3 (three) times daily. (Patient taking differently: Take 300 mg by mouth at bedtime. ) 90 capsule 3  . levothyroxine (SYNTHROID, LEVOTHROID) 50 MCG tablet Take 50 mcg by mouth daily before breakfast.    . lidocaine-prilocaine (EMLA) cream Apply 1 application topically as needed. Apply to [facility] a cath site at least one hour prior to [employer]. 30 g 2  . loperamide (IMODIUM A-D) 2 MG tablet Take 2 mg by mouth as needed for diarrhea or loose stools (as directed.).    [name] LORazepam (ATIVAN) 0.5 MG tablet Take 1 tablet (0.5 mg total) by mouth 2 (two) times daily as needed for anxiety. 30 tablet 0  . meloxicam (MOBIC) 7.5 MG tablet Take 7.5 mg by mouth 2 (two) times daily  as needed for pain.     [name] PRESCRIPTION MEDICATION Chemo at [facility]    . traMADol (ULTRAM) 50 MG tablet Take 1 tablet (50 mg total) by mouth every 6 (six) hours as needed  for moderate pain. 60 tablet 0  . valsartan (DIOVAN) 160 MG tablet TAKE ONE TABLET BY MOUTH ONE TIME DAILY  30 tablet 0  . diphenhydramine-acetaminophen (TYLENOL PM) 25-500 MG TABS Take 1 tablet by mouth at bedtime as needed (Sleep).     [name] lenalidomide (REVLIMID) 10 MG capsule Take 1 capsule (10 mg total) by mouth daily. (Patient not taking: Reported on 08/23/2014) 21 capsule 0   No current facility-administered medications for this visit.    PHYSICAL EXAMINATION: ECOG PERFORMANCE STATUS: 1 - Symptomatic but completely ambulatory  Filed Vitals:   08/23/14 1428  BP: 162/55  Pulse: 70  Temp: 98 F (36.7 C)  Resp: 18   Filed Weights   08/23/14 1428  Weight: 126 lb (57.153 kg)    GENERAL:alert, no distress and comfortable SKIN: skin color, texture, turgor are normal, no rashes or significant lesions EYES: normal, Conjunctiva are pink and non-injected, sclera clear OROPHARYNX:no exudate, no erythema and lips, buccal mucosa, and tongue normal  NECK: supple, thyroid normal size, non-tender, without nodularity LYMPH:  no palpable lymphadenopathy in the cervical, axillary or inguinal LUNGS: clear to auscultation and percussion with normal breathing effort HEART: regular rate & rhythm and no murmurs and no lower extremity edema ABDOMEN:abdomen soft, non-tender and normal bowel sounds Musculoskeletal:no cyanosis of digits and no clubbing  NEURO: alert & oriented x 3 with fluent speech, no focal motor/sensory deficits  LABORATORY DATA:  I have reviewed the data as listed    Component Value Date/Time   NA 135* 08/23/2014 1405   NA 138 08/05/2014 0609   K 3.9 08/23/2014 1405   K 3.6 08/05/2014 0609   CL 104 08/05/2014 0609   CL 103 11/25/2012 1328   CO2 27 08/23/2014 1405   CO2 27 08/05/2014 0609   GLUCOSE 129 08/23/2014 1405   GLUCOSE 95 08/05/2014 0609   GLUCOSE 91 11/25/2012 1328   BUN 10.7 08/23/2014 1405   BUN 5* 08/05/2014 0609   CREATININE 0.8 08/23/2014 1405    CREATININE 0.56 08/05/2014 0609   CALCIUM 8.6 08/23/2014 1405   CALCIUM 8.2* 08/05/2014 0609   PROT 6.9 08/23/2014 1405   PROT 5.7* 08/04/2014 0556   ALBUMIN 3.2* 08/23/2014 1405   ALBUMIN 2.9* 08/04/2014 0556   AST 19 08/23/2014 1405   AST 19 08/04/2014 0556   ALT 13 08/23/2014 1405   ALT 14 08/04/2014 0556   ALKPHOS 73 08/23/2014 1405   ALKPHOS 53 08/04/2014 0556   BILITOT 0.31 08/23/2014 1405   BILITOT 0.6 08/04/2014 0556   GFRNONAA 82* 08/05/2014 0609   GFRAA >90 08/05/2014 0609    No results found for: SPEP, UPEP  Lab Results  Component Value Date   WBC 4.8 08/23/2014   NEUTROABS 3.5 08/23/2014   HGB 10.6* 08/23/2014   HCT 31.6* 08/23/2014   MCV 98.4 08/23/2014   PLT 276 08/23/2014      Chemistry      Component Value Date/Time   NA 135* 08/23/2014 1405   NA 138 08/05/2014 0609   K 3.9 08/23/2014 1405   K 3.6 08/05/2014 0609   CL 104 08/05/2014 0609   CL 103 11/25/2012 1328   CO2 27 08/23/2014 1405   CO2 27 08/05/2014 0609   BUN 10.7 08/23/2014 1405   BUN  5* 08/05/2014 0609   CREATININE 0.8 08/23/2014 1405   CREATININE 0.56 08/05/2014 0609      Component Value Date/Time   CALCIUM 8.6 08/23/2014 1405   CALCIUM 8.2* 08/05/2014 0609   ALKPHOS 73 08/23/2014 1405   ALKPHOS 53 08/04/2014 0556   AST 19 08/23/2014 1405   AST 19 08/04/2014 0556   ALT 13 08/23/2014 1405   ALT 14 08/04/2014 0556   BILITOT 0.31 08/23/2014 1405   BILITOT 0.6 08/04/2014 0556     ASSESSMENT & PLAN:  Multiple myeloma The patient had significant complication related to recent traumatic compression fracture. She is blaming that the infusional elotuzumab is the cause of this. I discussed with the patient that a lot of her symptoms were related to uncontrolled pain and not related to treatment. In any case, the patient is not willing to go back to treatment. She is aware that single agent Revlimid alone could bring in some partial response but not as effective as combination  treatment. She will continue on Revlimid for now. She will continue Zometa every 3 months. I recommend she continues to take dexamethasone weekly and plan to taper her in the future.   Anemia in neoplastic disease This is likely anemia of chronic disease. The patient denies recent history of bleeding such as epistaxis, hematuria or hematochezia. She is asymptomatic from the anemia. We will observe for now.  She does not require transfusion now.    Vertebral compression fracture She had kyphoplasty. Biopsy of the bone confirm multiple myeloma which does not come as a surprise. The patient refused to take pain medicine. I refilled prescription tramadol for her. In the meantime, I recommend she continue calcium with vitamin D. We will continue Zometa every 3 months, her next infusion will be due in April 2016.    Orders Placed This Encounter  Procedures  . SPEP & IFE with QIG    Standing Status: Future     Number of Occurrences:      Standing Expiration Date: 09/27/2015  . Kappa/lambda light chains    Standing Status: Future     Number of Occurrences:      Standing Expiration Date: 09/27/2015   All questions were answered. The patient knows to call the clinic with any problems, questions or concerns. No barriers to learning was detected. I spent 25 minutes counseling the patient face to face. The total time spent in the appointment was 30 minutes and more than 50% was on counseling and review of test results     [facility], Karesa Maultsby, MD 08/24/2014 1:57 PM

## 2014-08-24 NOTE — Assessment & Plan Note (Signed)
This is likely anemia of chronic disease. The patient denies recent history of bleeding such as epistaxis, hematuria or hematochezia. She is asymptomatic from the anemia. We will observe for now.  She does not require transfusion now.   

## 2014-08-27 ENCOUNTER — Other Ambulatory Visit: Payer: Self-pay | Admitting: Cardiovascular Disease

## 2014-08-28 ENCOUNTER — Other Ambulatory Visit: Payer: Self-pay | Admitting: Hematology and Oncology

## 2014-08-30 ENCOUNTER — Encounter: Payer: Self-pay | Admitting: Hematology and Oncology

## 2014-08-30 ENCOUNTER — Other Ambulatory Visit: Payer: Self-pay | Admitting: Hematology and Oncology

## 2014-09-03 ENCOUNTER — Other Ambulatory Visit: Payer: Self-pay | Admitting: *Deleted

## 2014-09-03 ENCOUNTER — Telehealth: Payer: Self-pay | Admitting: Hematology and Oncology

## 2014-09-03 NOTE — Telephone Encounter (Signed)
added appt per pof....pt ok and aware °

## 2014-09-04 ENCOUNTER — Other Ambulatory Visit: Payer: BLUE CROSS/BLUE SHIELD

## 2014-09-04 ENCOUNTER — Telehealth: Payer: Self-pay | Admitting: Hematology and Oncology

## 2014-09-04 NOTE — Telephone Encounter (Signed)
added appt per staff....per staff pt aware °

## 2014-09-05 ENCOUNTER — Telehealth: Payer: Self-pay | Admitting: Hematology and Oncology

## 2014-09-05 ENCOUNTER — Ambulatory Visit (HOSPITAL_BASED_OUTPATIENT_CLINIC_OR_DEPARTMENT_OTHER): Payer: Medicare Other | Admitting: Hematology and Oncology

## 2014-09-05 ENCOUNTER — Encounter: Payer: Self-pay | Admitting: Hematology and Oncology

## 2014-09-05 VITALS — BP 146/65 | HR 63 | Temp 97.8°F | Resp 18 | Ht 65.0 in | Wt 122.3 lb

## 2014-09-05 DIAGNOSIS — F32A Depression, unspecified: Secondary | ICD-10-CM

## 2014-09-05 DIAGNOSIS — D630 Anemia in neoplastic disease: Secondary | ICD-10-CM

## 2014-09-05 DIAGNOSIS — M4850XD Collapsed vertebra, not elsewhere classified, site unspecified, subsequent encounter for fracture with routine healing: Secondary | ICD-10-CM

## 2014-09-05 DIAGNOSIS — C9000 Multiple myeloma not having achieved remission: Secondary | ICD-10-CM

## 2014-09-05 DIAGNOSIS — F329 Major depressive disorder, single episode, unspecified: Secondary | ICD-10-CM

## 2014-09-05 DIAGNOSIS — M129 Arthropathy, unspecified: Secondary | ICD-10-CM

## 2014-09-05 DIAGNOSIS — IMO0001 Reserved for inherently not codable concepts without codable children: Secondary | ICD-10-CM

## 2014-09-05 HISTORY — DX: Depression, unspecified: F32.A

## 2014-09-05 MED ORDER — TRAMADOL HCL 50 MG PO TABS: 50.0000 mg | ORAL_TABLET | Freq: Four times a day (QID) | ORAL | Status: DC | PRN

## 2014-09-05 MED ORDER — MIRTAZAPINE 7.5 MG PO TABS: 7.5000 mg | ORAL_TABLET | Freq: Every day | ORAL | Status: DC

## 2014-09-05 MED ORDER — PREDNISONE 5 MG PO TABS: 5.0000 mg | ORAL_TABLET | Freq: Every day | ORAL | Status: DC

## 2014-09-05 NOTE — Telephone Encounter (Signed)
Gave calendar. Patient didn't want AVS/

## 2014-09-06 NOTE — Assessment & Plan Note (Signed)
This is likely anemia of chronic disease. The patient denies recent history of bleeding such as epistaxis, hematuria or hematochezia. She is asymptomatic from the anemia. We will observe for now.  

## 2014-09-06 NOTE — Assessment & Plan Note (Signed)
She has severe pain but was unable to tolerate narcotics due to severe constipation. She is doing well with tramadol. Hopefully, with the addition of prednisone, this might help with bone pain. I recommend she discontinue meloxicam while on prednisone

## 2014-09-06 NOTE — Progress Notes (Signed)
[place] OFFICE PROGRESS NOTE  Patient Care Team: Thressa Sheller, MD as PCP - General (Internal Medicine) Thressa Sheller, MD (Internal Medicine) Renella Cunas, MD as Attending Physician (Cardiology) Heath Lark, MD as Consulting Physician (Hematology and Oncology) Rob Hickman, MD as Consulting Physician (Interventional Radiology)  SUMMARY OF ONCOLOGIC HISTORY: Oncology History   The     Multiple myeloma   11/12/2008 Imaging Skeletal survey showed lytic lesion in the right femur compatible with  myeloma. There were Questionable skull lesions   11/15/2008 Bone Marrow Biopsy BONE MARROW ASPIRATE AND BIOPSY: showed NORMOCELLULAR MARROW FOR AGE WITH PLASMA CELL DYSCRASIA  (PLASMA CELLS 25%). 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 Subsequently she was placed on Revlimid & Dexamethasone alone   05/12/2013 Bone Marrow Biopsy Bone marrow biopsy showed persistent plasma cells. Blood work confirmed VGPR status   06/11/2014 Tumor Marker Bloodwork show disease relapse   06/26/2014 Procedure She has placement of port   07/05/2014 - 07/26/2014 Chemotherapy She received Elotuzumab and Revlimid. Rx was discontinued with elotuzumab per patient preference   08/01/2014 - 08/05/2014 Hospital Admission She was admitted to the hospital for kyphoplasty and pain management after traumatic compression fracture    INTERVAL HISTORY: Please see below for problem oriented charting. She is seen as an urgent visit because she is getting depressed and is crying. She has pain but cannot tolerate anything be on tramadol. When she takes oxycodone, she is severely constipated. She has lost her appetite. She denies sleep problems  REVIEW OF SYSTEMS:   Constitutional: Denies fevers, chills or abnormal weight loss Eyes: Denies blurriness of  vision Ears, nose, mouth, throat, and face: Denies mucositis or sore throat Respiratory: Denies cough, dyspnea or wheezes Cardiovascular: Denies palpitation, chest discomfort or lower extremity swelling Gastrointestinal:  Denies nausea, heartburn or change in bowel habits Skin: Denies abnormal skin rashes Lymphatics: Denies new lymphadenopathy or easy bruising Neurological:Denies numbness, tingling or new weaknesses Behavioral/Psych: Mood is stable, no new changes  All other systems were reviewed with the patient and are negative.  I have reviewed the past medical history, past surgical history, social history and family history with the patient and they are unchanged from previous note.  ALLERGIES:  has No Known Allergies.  MEDICATIONS:  Current Outpatient Prescriptions  Medication Sig Dispense Refill  . acetaminophen (TYLENOL) 325 MG tablet Take 650 mg by mouth every 6 (six) hours as needed (Pain).     [name] aspirin 325 MG EC tablet Take 325 mg by mouth daily.    . cholecalciferol (VITAMIN D) 1000 UNITS tablet Take 1,000 Units by mouth daily.    . diphenhydramine-acetaminophen (TYLENOL PM) 25-500 MG TABS Take 1 tablet by mouth at bedtime as needed (Sleep).     . gabapentin (NEURONTIN) 300 MG capsule Take 1 capsule (300 mg total) by mouth 3 (three) times daily. (Patient taking differently: Take 300 mg by mouth at bedtime. ) 90 capsule 3  . lenalidomide (REVLIMID) 10 MG capsule Take 1 capsule (10 mg total) by mouth daily. (Patient not taking: Reported on 08/23/2014) 21 capsule 0  . levothyroxine (SYNTHROID, LEVOTHROID) 50 MCG tablet Take 50 mcg by mouth daily before breakfast.    . lidocaine-prilocaine (EMLA) cream Apply 1 application topically as needed. Apply to [facility] a cath site at least one hour prior to [employer]. 30 g 2  . loperamide (IMODIUM A-D) 2 MG  tablet Take 2 mg by mouth as needed for diarrhea or loose stools (as directed.).    [name] LORazepam (ATIVAN) 0.5 MG tablet TAKE ONE TABLET  BY MOUTH TWICE DAILY AS NEEDED FOR ANXIETY  30 tablet 0  . mirtazapine (REMERON) 7.5 MG tablet Take 1 tablet (7.5 mg total) by mouth at bedtime. 30 tablet 3  . predniSONE (DELTASONE) 5 MG tablet Take 1 tablet (5 mg total) by mouth daily with breakfast. 30 tablet 3  . PRESCRIPTION MEDICATION Chemo at [facility]    . traMADol (ULTRAM) 50 MG tablet Take 1 tablet (50 mg total) by mouth every 6 (six) hours as needed for moderate pain. 60 tablet 0  . valsartan (DIOVAN) 160 MG tablet Take 1 tablet by mouth daily. Needs office visit. 30 tablet 0   No current facility-administered medications for this visit.    PHYSICAL EXAMINATION: ECOG PERFORMANCE STATUS: 1 - Symptomatic but completely ambulatory  Filed Vitals:   09/05/14 1039  BP: 146/65  Pulse: 63  Temp: 97.8 F (36.6 C)  Resp: 18   Filed Weights   09/05/14 1039  Weight: 122 lb 4.8 oz (55.475 kg)    GENERAL:alert, no distress and comfortable SKIN: skin color, texture, turgor are normal, no rashes or significant lesions Musculoskeletal:no cyanosis of digits and no clubbing  NEURO: alert & oriented x 3 with fluent speech, no focal motor/sensory deficits. She is tearful  LABORATORY DATA:  I have reviewed the data as listed    Component Value Date/Time   NA 135* 08/23/2014 1405   NA 138 08/05/2014 0609   K 3.9 08/23/2014 1405   K 3.6 08/05/2014 0609   CL 104 08/05/2014 0609   CL 103 11/25/2012 1328   CO2 27 08/23/2014 1405   CO2 27 08/05/2014 0609   GLUCOSE 129 08/23/2014 1405   GLUCOSE 95 08/05/2014 0609   GLUCOSE 91 11/25/2012 1328   BUN 10.7 08/23/2014 1405   BUN 5* 08/05/2014 0609   CREATININE 0.8 08/23/2014 1405   CREATININE 0.56 08/05/2014 0609   CALCIUM 8.6 08/23/2014 1405   CALCIUM 8.2* 08/05/2014 0609   PROT 6.9 08/23/2014 1405   PROT 5.7* 08/04/2014 0556   ALBUMIN 3.2* 08/23/2014 1405   ALBUMIN 2.9* 08/04/2014 0556   AST 19 08/23/2014 1405   AST 19 08/04/2014 0556   ALT 13 08/23/2014 1405   ALT 14 08/04/2014 0556    ALKPHOS 73 08/23/2014 1405   ALKPHOS 53 08/04/2014 0556   BILITOT 0.31 08/23/2014 1405   BILITOT 0.6 08/04/2014 0556   GFRNONAA 82* 08/05/2014 0609   GFRAA >90 08/05/2014 0609    No results found for: SPEP, UPEP  Lab Results  Component Value Date   WBC 4.8 08/23/2014   NEUTROABS 3.5 08/23/2014   HGB 10.6* 08/23/2014   HCT 31.6* 08/23/2014   MCV 98.4 08/23/2014   PLT 276 08/23/2014      Chemistry      Component Value Date/Time   NA 135* 08/23/2014 1405   NA 138 08/05/2014 0609   K 3.9 08/23/2014 1405   K 3.6 08/05/2014 0609   CL 104 08/05/2014 0609   CL 103 11/25/2012 1328   CO2 27 08/23/2014 1405   CO2 27 08/05/2014 0609   BUN 10.7 08/23/2014 1405   BUN 5* 08/05/2014 0609   CREATININE 0.8 08/23/2014 1405   CREATININE 0.56 08/05/2014 0609      Component Value Date/Time   CALCIUM 8.6 08/23/2014 1405   CALCIUM 8.2* 08/05/2014 0609   ALKPHOS  73 08/23/2014 1405   ALKPHOS 53 08/04/2014 0556   AST 19 08/23/2014 1405   AST 19 08/04/2014 0556   ALT 13 08/23/2014 1405   ALT 14 08/04/2014 0556   BILITOT 0.31 08/23/2014 1405   BILITOT 0.6 08/04/2014 0556     ASSESSMENT & PLAN:  Multiple myeloma The patient had significant complication related to recent traumatic compression fracture. She is blaming that the infusional elotuzumab is the cause of this. I discussed with the patient that a lot of her symptoms were related to uncontrolled pain and not related to treatment. In any case, the patient is not willing to go back to treatment. She is aware that single agent Revlimid alone could bring in some partial response but not as effective as combination treatment. She will continue on Revlimid for now, just started a new cycle on 08/27/2014. She will continue Zometa every 3 months. Due to severe bone pain, I recommend low-dose prednisone daily to 5 mg.     Anemia in neoplastic disease This is likely anemia of chronic disease. The patient denies recent history of  bleeding such as epistaxis, hematuria or hematochezia. She is asymptomatic from the anemia. We will observe for now.    Depression The patient is profoundly depressed due to recent hospitalization and compression fracture. She has lost appetite and have sent mood. She denies suicidal ideation. I recommend a trial of mirtazapine   Vertebral compression fracture She has severe pain but was unable to tolerate narcotics due to severe constipation. She is doing well with tramadol. Hopefully, with the addition of prednisone, this might help with bone pain. I recommend she discontinue meloxicam while on prednisone    No orders of the defined types were placed in this encounter.   All questions were answered. The patient knows to call the clinic with any problems, questions or concerns. No barriers to learning was detected. I spent 25 minutes counseling the patient face to face. The total time spent in the appointment was 30 minutes and more than 50% was on counseling and review of test results     [facility], [place], MD 09/06/2014 8:32 AM

## 2014-09-06 NOTE — Assessment & Plan Note (Signed)
The patient is profoundly depressed due to recent hospitalization and compression fracture. She has lost appetite and have sent mood. She denies suicidal ideation. I recommend a trial of mirtazapine

## 2014-09-06 NOTE — Assessment & Plan Note (Signed)
The patient had significant complication related to recent traumatic compression fracture. She is blaming that the infusional elotuzumab is the cause of this. I discussed with the patient that a lot of her symptoms were related to uncontrolled pain and not related to treatment. In any case, the patient is not willing to go back to treatment. She is aware that single agent Revlimid alone could bring in some partial response but not as effective as combination treatment. She will continue on Revlimid for now, just started a new cycle on 08/27/2014. She will continue Zometa every 3 months. Due to severe bone pain, I recommend low-dose prednisone daily to 5 mg.

## 2014-09-10 ENCOUNTER — Telehealth: Payer: Self-pay | Admitting: *Deleted

## 2014-09-10 ENCOUNTER — Telehealth: Payer: Self-pay | Admitting: Hematology and Oncology

## 2014-09-10 NOTE — Telephone Encounter (Signed)
It is probably referred pain from her recent fracture. Continue to take tramadol

## 2014-09-10 NOTE — Telephone Encounter (Signed)
Per Dr. Alvy Bimler, I informed patient that the pain is probably from her recent fracture. Continue to take Tramadol as ordered. Patient verbalized understanding.

## 2014-09-10 NOTE — Telephone Encounter (Signed)
Received voice message from patient stating,"I'm having extreme pain in my left rib, could this be arthritis? My pain medication is not touching the pain. Do I need to have a bone scan?" Return number is [phone].

## 2014-09-10 NOTE — Telephone Encounter (Signed)
s.w. and r/s MD visit due to MD out of the office....pt ok adn aware °

## 2014-09-11 ENCOUNTER — Other Ambulatory Visit: Payer: BLUE CROSS/BLUE SHIELD

## 2014-09-11 ENCOUNTER — Ambulatory Visit: Payer: BLUE CROSS/BLUE SHIELD | Admitting: Hematology and Oncology

## 2014-09-12 ENCOUNTER — Telehealth: Payer: Self-pay | Admitting: *Deleted

## 2014-09-12 NOTE — Telephone Encounter (Signed)
S/w pt and she has since looked up side effects of Prednisone and thinks the urinary problem is more likely to due to prednisone than to mirtazapine.  She started both meds the same day.  Instructed pt she can try 1/2 prednisone to see if urinary symptoms improve and if not then try 1/2 mirtazapine to see if that helps.  Instructed to continue to drink plenty of fluids and inform Dr. Alvy Bimler if her symptoms worsen or don't improve.  She verbalized understanding.

## 2014-09-12 NOTE — Telephone Encounter (Signed)
Yes, urinary retention could be a possibility. She can try to cut it to half a pill

## 2014-09-12 NOTE — Telephone Encounter (Signed)
Pt reports her mood has improved some on the Mirtazapine but she is concerned about decreased urination.  States she is only urinating about twice a day and a few times during the night.  When she does urinate during the night it is only "dribbles."   Pt feels she is drinking plenty of water and juice and asks if this is a side effect of Mirtazapine?  Should she be concerned?

## 2014-09-19 ENCOUNTER — Ambulatory Visit (HOSPITAL_BASED_OUTPATIENT_CLINIC_OR_DEPARTMENT_OTHER): Payer: Medicare Other

## 2014-09-19 ENCOUNTER — Telehealth: Payer: Self-pay | Admitting: *Deleted

## 2014-09-19 ENCOUNTER — Ambulatory Visit (HOSPITAL_BASED_OUTPATIENT_CLINIC_OR_DEPARTMENT_OTHER): Payer: Medicare Other | Admitting: Hematology and Oncology

## 2014-09-19 ENCOUNTER — Telehealth: Payer: Self-pay | Admitting: Hematology and Oncology

## 2014-09-19 ENCOUNTER — Other Ambulatory Visit (HOSPITAL_BASED_OUTPATIENT_CLINIC_OR_DEPARTMENT_OTHER): Payer: Medicare Other

## 2014-09-19 VITALS — BP 135/44 | HR 62 | Temp 97.6°F | Resp 18 | Ht 65.0 in | Wt 122.2 lb

## 2014-09-19 DIAGNOSIS — C9000 Multiple myeloma not having achieved remission: Secondary | ICD-10-CM

## 2014-09-19 DIAGNOSIS — Z95828 Presence of other vascular implants and grafts: Secondary | ICD-10-CM

## 2014-09-19 DIAGNOSIS — D630 Anemia in neoplastic disease: Secondary | ICD-10-CM

## 2014-09-19 DIAGNOSIS — F329 Major depressive disorder, single episode, unspecified: Secondary | ICD-10-CM

## 2014-09-19 DIAGNOSIS — D72819 Decreased white blood cell count, unspecified: Secondary | ICD-10-CM

## 2014-09-19 DIAGNOSIS — M4850XS Collapsed vertebra, not elsewhere classified, site unspecified, sequela of fracture: Secondary | ICD-10-CM

## 2014-09-19 DIAGNOSIS — IMO0001 Reserved for inherently not codable concepts without codable children: Secondary | ICD-10-CM

## 2014-09-19 DIAGNOSIS — T451X5A Adverse effect of antineoplastic and immunosuppressive drugs, initial encounter: Secondary | ICD-10-CM

## 2014-09-19 DIAGNOSIS — D701 Agranulocytosis secondary to cancer chemotherapy: Secondary | ICD-10-CM

## 2014-09-19 DIAGNOSIS — F32A Depression, unspecified: Secondary | ICD-10-CM

## 2014-09-19 DIAGNOSIS — M129 Arthropathy, unspecified: Secondary | ICD-10-CM

## 2014-09-19 LAB — CBC WITH DIFFERENTIAL/PLATELET
BASO%: 1 % (ref 0.0–2.0)
BASOS ABS: 0 10*3/uL (ref 0.0–0.1)
EOS ABS: 0 10*3/uL (ref 0.0–0.5)
EOS%: 1 % (ref 0.0–7.0)
HEMATOCRIT: 32.4 % — AB (ref 34.8–46.6)
HGB: 10.6 g/dL — ABNORMAL LOW (ref 11.6–15.9)
LYMPH#: 0.9 10*3/uL (ref 0.9–3.3)
LYMPH%: 31.1 % (ref 14.0–49.7)
MCH: 32.5 pg (ref 25.1–34.0)
MCHC: 32.6 g/dL (ref 31.5–36.0)
MCV: 99.7 fL (ref 79.5–101.0)
MONO#: 0.3 10*3/uL (ref 0.1–0.9)
MONO%: 9.7 % (ref 0.0–14.0)
NEUT%: 57.2 % (ref 38.4–76.8)
NEUTROS ABS: 1.7 10*3/uL (ref 1.5–6.5)
Platelets: 210 10*3/uL (ref 145–400)
RBC: 3.25 10*6/uL — ABNORMAL LOW (ref 3.70–5.45)
RDW: 16.1 % — ABNORMAL HIGH (ref 11.2–14.5)
WBC: 2.9 10*3/uL — AB (ref 3.9–10.3)

## 2014-09-19 LAB — COMPREHENSIVE METABOLIC PANEL (CC13)
ALT: 21 U/L (ref 0–55)
ANION GAP: 8 meq/L (ref 3–11)
AST: 21 U/L (ref 5–34)
Albumin: 3.2 g/dL — ABNORMAL LOW (ref 3.5–5.0)
Alkaline Phosphatase: 53 U/L (ref 40–150)
BUN: 15.2 mg/dL (ref 7.0–26.0)
CALCIUM: 8.3 mg/dL — AB (ref 8.4–10.4)
CHLORIDE: 103 meq/L (ref 98–109)
CO2: 26 meq/L (ref 22–29)
CREATININE: 0.7 mg/dL (ref 0.6–1.1)
EGFR: 79 mL/min/{1.73_m2} — AB (ref >90)
Glucose: 88 mg/dl (ref 70–140)
Potassium: 4 mEq/L (ref 3.5–5.1)
Sodium: 137 mEq/L (ref 136–145)
Total Bilirubin: 0.52 mg/dL (ref 0.20–1.20)
Total Protein: 6.4 g/dL (ref 6.4–8.3)

## 2014-09-19 MED ORDER — SODIUM CHLORIDE 0.9 % IJ SOLN
10.0000 mL | INTRAMUSCULAR | Status: DC | PRN
  Administered 2014-09-19: 10 mL via INTRAVENOUS
  Filled 2014-09-19: qty 10

## 2014-09-19 MED ORDER — HEPARIN SOD (PORK) LOCK FLUSH 100 UNIT/ML IV SOLN
500.0000 [IU] | Freq: Once | INTRAVENOUS | Status: AC
Start: 2014-09-19 — End: 2014-09-19
  Administered 2014-09-19: 500 [IU] via INTRAVENOUS
  Filled 2014-09-19: qty 5

## 2014-09-19 MED ORDER — LENALIDOMIDE 10 MG PO CAPS: 10.0000 mg | ORAL_CAPSULE | Freq: Every day | ORAL | Status: DC

## 2014-09-19 NOTE — Telephone Encounter (Signed)
Faxed Revlimid rx to [facility]. Authorization # [id number]

## 2014-09-19 NOTE — Telephone Encounter (Signed)
Pt confirmed labs/ov per 03/16 POF, gave pt AVS and calendar.... KJ, sent msg to add Zometa

## 2014-09-19 NOTE — Telephone Encounter (Signed)
Per staff message and POF I have scheduled appts. Advised scheduler of appts. JMW  

## 2014-09-19 NOTE — Patient Instructions (Signed)

## 2014-09-20 ENCOUNTER — Emergency Department (HOSPITAL_COMMUNITY): Payer: Medicare Other

## 2014-09-20 ENCOUNTER — Observation Stay (HOSPITAL_COMMUNITY)
Admission: EM | Admit: 2014-09-20 | Discharge: 2014-09-21 | Disposition: A | Discharge status: Home / Self Care | Payer: Medicare Other | Attending: Internal Medicine | Admitting: Internal Medicine

## 2014-09-20 ENCOUNTER — Other Ambulatory Visit: Payer: BLUE CROSS/BLUE SHIELD

## 2014-09-20 ENCOUNTER — Ambulatory Visit: Payer: BLUE CROSS/BLUE SHIELD | Admitting: Hematology and Oncology

## 2014-09-20 ENCOUNTER — Encounter (HOSPITAL_COMMUNITY): Payer: Self-pay | Admitting: *Deleted

## 2014-09-20 ENCOUNTER — Telehealth: Payer: Self-pay | Admitting: Cardiovascular Disease

## 2014-09-20 ENCOUNTER — Other Ambulatory Visit: Payer: Self-pay

## 2014-09-20 DIAGNOSIS — I10 Essential (primary) hypertension: Secondary | ICD-10-CM | POA: Insufficient documentation

## 2014-09-20 DIAGNOSIS — Z853 Personal history of malignant neoplasm of breast: Secondary | ICD-10-CM | POA: Diagnosis not present

## 2014-09-20 DIAGNOSIS — G629 Polyneuropathy, unspecified: Secondary | ICD-10-CM | POA: Diagnosis not present

## 2014-09-20 DIAGNOSIS — R079 Chest pain, unspecified: Principal | ICD-10-CM | POA: Diagnosis present

## 2014-09-20 DIAGNOSIS — Z8781 Personal history of (healed) traumatic fracture: Secondary | ICD-10-CM | POA: Insufficient documentation

## 2014-09-20 DIAGNOSIS — I2510 Atherosclerotic heart disease of native coronary artery without angina pectoris: Secondary | ICD-10-CM | POA: Diagnosis not present

## 2014-09-20 DIAGNOSIS — C9000 Multiple myeloma not having achieved remission: Secondary | ICD-10-CM | POA: Insufficient documentation

## 2014-09-20 DIAGNOSIS — I351 Nonrheumatic aortic (valve) insufficiency: Secondary | ICD-10-CM | POA: Diagnosis not present

## 2014-09-20 DIAGNOSIS — Z7982 Long term (current) use of aspirin: Secondary | ICD-10-CM | POA: Insufficient documentation

## 2014-09-20 DIAGNOSIS — F329 Major depressive disorder, single episode, unspecified: Secondary | ICD-10-CM | POA: Insufficient documentation

## 2014-09-20 DIAGNOSIS — D709 Neutropenia, unspecified: Secondary | ICD-10-CM | POA: Insufficient documentation

## 2014-09-20 DIAGNOSIS — D0510 Intraductal carcinoma in situ of unspecified breast: Secondary | ICD-10-CM | POA: Diagnosis present

## 2014-09-20 DIAGNOSIS — I340 Nonrheumatic mitral (valve) insufficiency: Secondary | ICD-10-CM | POA: Insufficient documentation

## 2014-09-20 DIAGNOSIS — I447 Left bundle-branch block, unspecified: Secondary | ICD-10-CM | POA: Insufficient documentation

## 2014-09-20 DIAGNOSIS — C9002 Multiple myeloma in relapse: Secondary | ICD-10-CM | POA: Diagnosis present

## 2014-09-20 DIAGNOSIS — G620 Drug-induced polyneuropathy: Secondary | ICD-10-CM

## 2014-09-20 DIAGNOSIS — M4850XA Collapsed vertebra, not elsewhere classified, site unspecified, initial encounter for fracture: Secondary | ICD-10-CM | POA: Diagnosis present

## 2014-09-20 DIAGNOSIS — T451X5A Adverse effect of antineoplastic and immunosuppressive drugs, initial encounter: Secondary | ICD-10-CM

## 2014-09-20 HISTORY — DX: Atherosclerotic heart disease of native coronary artery without angina pectoris: I25.10

## 2014-09-20 HISTORY — DX: Unspecified fracture of t11-T12 vertebra, initial encounter for closed fracture: S22.089A

## 2014-09-20 LAB — CBC
HCT: 33.3 % — ABNORMAL LOW (ref 36.0–46.0)
HEMOGLOBIN: 11.1 g/dL — AB (ref 12.0–15.0)
MCH: 32.6 pg (ref 26.0–34.0)
MCHC: 33.3 g/dL (ref 30.0–36.0)
MCV: 97.7 fL (ref 78.0–100.0)
Platelets: 243 10*3/uL (ref 150–400)
RBC: 3.41 MIL/uL — AB (ref 3.87–5.11)
RDW: 15.6 % — AB (ref 11.5–15.5)
WBC: 3.9 10*3/uL — ABNORMAL LOW (ref 4.0–10.5)

## 2014-09-20 LAB — I-STAT TROPONIN, ED: Troponin i, poc: 0.01 ng/mL (ref 0.00–0.08)

## 2014-09-20 LAB — COMPREHENSIVE METABOLIC PANEL
ALK PHOS: 56 U/L (ref 39–117)
ALT: 21 U/L (ref 0–35)
AST: 28 U/L (ref 0–37)
Albumin: 3.4 g/dL — ABNORMAL LOW (ref 3.5–5.2)
Anion gap: 7 (ref 5–15)
BUN: 16 mg/dL (ref 6–23)
CO2: 28 mmol/L (ref 19–32)
Calcium: 8.5 mg/dL (ref 8.4–10.5)
Chloride: 99 mmol/L (ref 96–112)
Creatinine, Ser: 0.86 mg/dL (ref 0.50–1.10)
GFR calc non Af Amer: 59 mL/min — ABNORMAL LOW (ref >90)
GFR, EST AFRICAN AMERICAN: 69 mL/min — AB (ref >90)
Glucose, Bld: 91 mg/dL (ref 70–99)
Potassium: 3.9 mmol/L (ref 3.5–5.1)
Sodium: 134 mmol/L — ABNORMAL LOW (ref 135–145)
Total Bilirubin: 0.5 mg/dL (ref 0.3–1.2)
Total Protein: 7 g/dL (ref 6.0–8.3)

## 2014-09-20 NOTE — ED Notes (Addendum)
Pt with acute onset chest pain at 1700 while watering flowers.  Pain lasted for 20 minutes and dissipated when pt arrived in ED.  Denies sob or nausea.  That radiates up R jaw and R arm.

## 2014-09-20 NOTE — Assessment & Plan Note (Addendum)
The patient had significant complication related to recent traumatic compression fracture. She is blaming that the infusional elotuzumab is the cause of this. I discussed with the patient that a lot of her symptoms were related to uncontrolled pain and not related to treatment. In any case, the patient is not willing to go back to treatment. She is aware that single agent Revlimid alone could bring in some partial response but not as effective as combination treatment. She will continue on Revlimid for now, just started a new cycle soon. She will continue Zometa every 3 months. Her overall symptoms has improved. I recommend she start prednisone taper. When I see her back next week, she feels better, we can consider resumption of chemotherapy with additional Elotuzumab.

## 2014-09-20 NOTE — Assessment & Plan Note (Signed)
Overall, her depression and adjustment disorder has improved. I recommend she continues on Remeron.

## 2014-09-20 NOTE — Assessment & Plan Note (Signed)
She has severe pain but was unable to tolerate narcotics due to severe constipation. She is doing well with tramadol. Hopefully, with the addition of prednisone, this had helped with bone pain. I recommend she discontinue meloxicam while on prednisone We will proceed with prednisone taper over the next 2 weeks.

## 2014-09-20 NOTE — Telephone Encounter (Signed)
Pt c/o of Chest Pain: STAT if CP now or developed within 24 hours  1. Are you having CP right now? No  2. Are you experiencing any other symptoms (ex. SOB, nausea, vomiting, sweating)? No  3. How long have you been experiencing CP? A couple of weeks  4. Is your CP continuous or coming and going? Coming and going, more at night  5. Have you taken Nitroglycerin? No   Please call pt back and advise. ?

## 2014-09-20 NOTE — Progress Notes (Signed)
[place] OFFICE PROGRESS NOTE  Patient Care Team: Thressa Sheller, MD as PCP - General (Internal Medicine) Thressa Sheller, MD (Internal Medicine) Renella Cunas, MD as Attending Physician (Cardiology) Heath Lark, MD as Consulting Physician (Hematology and Oncology) Luanne Bras, MD as Consulting Physician (Interventional Radiology)  SUMMARY OF ONCOLOGIC HISTORY: Oncology History   The     Multiple myeloma   11/12/2008 Imaging Skeletal survey showed lytic lesion in the right femur compatible with  myeloma. There were Questionable skull lesions   11/15/2008 Bone Marrow Biopsy BONE MARROW ASPIRATE AND BIOPSY: showed NORMOCELLULAR MARROW FOR AGE WITH PLASMA CELL DYSCRASIA  (PLASMA CELLS 25%). 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 Subsequently she was placed on Revlimid & Dexamethasone alone   05/12/2013 Bone Marrow Biopsy Bone marrow biopsy showed persistent plasma cells. Blood work confirmed VGPR status   06/11/2014 Tumor Marker Bloodwork show disease relapse   06/26/2014 Procedure She has placement of port   07/05/2014 - 07/26/2014 Chemotherapy She received Elotuzumab and Revlimid. Rx was discontinued with elotuzumab per patient preference   08/01/2014 - 08/05/2014 Hospital Admission She was admitted to the hospital for kyphoplasty and pain management after traumatic compression fracture    INTERVAL HISTORY: Please see below for problem oriented charting. She is seen today for further supportive care and review of recent symptoms. She feels a whole better since her last visit. Her bone pain has improved. Her energy, appetite and mood has improved. Her depression has improved as well. Recently, she call us regarding urinary retention. That has resolved.  REVIEW OF SYSTEMS:   Constitutional: Denies fevers, chills or  abnormal weight loss Eyes: Denies blurriness of vision Ears, nose, mouth, throat, and face: Denies mucositis or sore throat Respiratory: Denies cough, dyspnea or wheezes Cardiovascular: Denies palpitation, chest discomfort or lower extremity swelling Gastrointestinal:  Denies nausea, heartburn or change in bowel habits Skin: Denies abnormal skin rashes Lymphatics: Denies new lymphadenopathy or easy bruising Neurological:Denies numbness, tingling or new weaknesses Behavioral/Psych: Mood is stable, no new changes  All other systems were reviewed with the patient and are negative.  I have reviewed the past medical history, past surgical history, social history and family history with the patient and they are unchanged from previous note.  ALLERGIES:  has No Known Allergies.  MEDICATIONS:  Current Outpatient Prescriptions  Medication Sig Dispense Refill  . acetaminophen (TYLENOL) 325 MG tablet Take 650 mg by mouth every 6 (six) hours as needed (Pain).     [name] aspirin 325 MG EC tablet Take 325 mg by mouth daily.    . benzonatate (TESSALON) 200 MG capsule     . cholecalciferol (VITAMIN D) 1000 UNITS tablet Take 1,000 Units by mouth daily.    . diphenhydramine-acetaminophen (TYLENOL PM) 25-500 MG TABS Take 1 tablet by mouth at bedtime as needed (Sleep).     . gabapentin (NEURONTIN) 300 MG capsule Take 1 capsule (300 mg total) by mouth 3 (three) times daily. (Patient taking differently: Take 300 mg by mouth at bedtime. ) 90 capsule 3  . HYDROmorphone (DILAUDID) 4 MG tablet     . lenalidomide (REVLIMID) 10 MG capsule Take 1 capsule (10 mg total) by mouth daily. 21 capsule 0  . levothyroxine (SYNTHROID, LEVOTHROID) 50 MCG tablet Take 50 mcg by mouth daily before breakfast.    . lidocaine-prilocaine (EMLA) cream Apply 1 application topically as needed. Apply to [facility]  a cath site at least one hour prior to [place]. 30 g 2  . loperamide (IMODIUM A-D) 2 MG tablet Take 2 mg by mouth as needed for  diarrhea or loose stools (as directed.).    [name] mirtazapine (REMERON) 7.5 MG tablet Take 1 tablet (7.5 mg total) by mouth at bedtime. 30 tablet 3  . oxyCODONE (OXY IR/ROXICODONE) 5 MG immediate release tablet     . predniSONE (DELTASONE) 5 MG tablet Take 1 tablet (5 mg total) by mouth daily with breakfast. 30 tablet 3  . PRESCRIPTION MEDICATION Chemo at [facility]    . traMADol (ULTRAM) 50 MG tablet Take 1 tablet (50 mg total) by mouth every 6 (six) hours as needed for moderate pain. 60 tablet 0  . valsartan (DIOVAN) 160 MG tablet Take 1 tablet by mouth daily. Needs office visit. 30 tablet 0  . cefdinir (OMNICEF) 300 MG capsule     . LORazepam (ATIVAN) 0.5 MG tablet TAKE ONE TABLET BY MOUTH TWICE DAILY AS NEEDED FOR ANXIETY  (Patient not taking: Reported on 09/19/2014) 30 tablet 0   No current facility-administered medications for this visit.    PHYSICAL EXAMINATION: ECOG PERFORMANCE STATUS: 0 - Asymptomatic  Filed Vitals:   09/19/14 1128  BP: 135/44  Pulse: 62  Temp: 97.6 F (36.4 C)  Resp: 18   Filed Weights   09/19/14 1128  Weight: 122 lb 3.2 oz (55.43 kg)    GENERAL:alert, no distress and comfortable SKIN: skin color, texture, turgor are normal, no rashes or significant lesions EYES: normal, Conjunctiva are pink and non-injected, sclera clear Musculoskeletal:no cyanosis of digits and no clubbing  NEURO: alert & oriented x 3 with fluent speech, no focal motor/sensory deficits  LABORATORY DATA:  I have reviewed the data as listed    Component Value Date/Time   NA 137 09/19/2014 1113   NA 138 08/05/2014 0609   K 4.0 09/19/2014 1113   K 3.6 08/05/2014 0609   CL 104 08/05/2014 0609   CL 103 11/25/2012 1328   CO2 26 09/19/2014 1113   CO2 27 08/05/2014 0609   GLUCOSE 88 09/19/2014 1113   GLUCOSE 95 08/05/2014 0609   GLUCOSE 91 11/25/2012 1328   BUN 15.2 09/19/2014 1113   BUN 5* 08/05/2014 0609   CREATININE 0.7 09/19/2014 1113   CREATININE 0.56 08/05/2014 0609   CALCIUM 8.3*  09/19/2014 1113   CALCIUM 8.2* 08/05/2014 0609   PROT 6.4 09/19/2014 1113   PROT 5.7* 08/04/2014 0556   ALBUMIN 3.2* 09/19/2014 1113   ALBUMIN 2.9* 08/04/2014 0556   AST 21 09/19/2014 1113   AST 19 08/04/2014 0556   ALT 21 09/19/2014 1113   ALT 14 08/04/2014 0556   ALKPHOS 53 09/19/2014 1113   ALKPHOS 53 08/04/2014 0556   BILITOT 0.52 09/19/2014 1113   BILITOT 0.6 08/04/2014 0556   GFRNONAA 82* 08/05/2014 0609   GFRAA >90 08/05/2014 0609    No results found for: SPEP, UPEP  Lab Results  Component Value Date   WBC 2.9* 09/19/2014   NEUTROABS 1.7 09/19/2014   HGB 10.6* 09/19/2014   HCT 32.4* 09/19/2014   MCV 99.7 09/19/2014   PLT 210 09/19/2014      Chemistry      Component Value Date/Time   NA 137 09/19/2014 1113   NA 138 08/05/2014 0609   K 4.0 09/19/2014 1113   K 3.6 08/05/2014 0609   CL 104 08/05/2014 0609   CL 103 11/25/2012 1328   CO2 26 09/19/2014 1113  CO2 27 08/05/2014 0609   BUN 15.2 09/19/2014 1113   BUN 5* 08/05/2014 0609   CREATININE 0.7 09/19/2014 1113   CREATININE 0.56 08/05/2014 0609      Component Value Date/Time   CALCIUM 8.3* 09/19/2014 1113   CALCIUM 8.2* 08/05/2014 0609   ALKPHOS 53 09/19/2014 1113   ALKPHOS 53 08/04/2014 0556   AST 21 09/19/2014 1113   AST 19 08/04/2014 0556   ALT 21 09/19/2014 1113   ALT 14 08/04/2014 0556   BILITOT 0.52 09/19/2014 1113   BILITOT 0.6 08/04/2014 0556      ASSESSMENT & PLAN:  Multiple myeloma The patient had significant complication related to recent traumatic compression fracture. She is blaming that the infusional elotuzumab is the cause of this. I discussed with the patient that a lot of her symptoms were related to uncontrolled pain and not related to treatment. In any case, the patient is not willing to go back to treatment. She is aware that single agent Revlimid alone could bring in some partial response but not as effective as combination treatment. She will continue on Revlimid for now,  just started a new cycle soon. She will continue Zometa every 3 months. Her overall symptoms has improved. I recommend she start prednisone taper. When I see her back next week, she feels better, we can consider resumption of chemotherapy with additional Elotuzumab.   Anemia in neoplastic disease This is likely anemia of chronic disease. The patient denies recent history of bleeding such as epistaxis, hematuria or hematochezia. She is asymptomatic from the anemia. We will observe for now.    Leukopenia due to antineoplastic chemotherapy This is likely due to recent treatment. The patient denies recent history of fevers, cough, chills, diarrhea or dysuria. She is asymptomatic from the leukopenia. I will observe for now.  I will continue the chemotherapy at current dose without dosage adjustment.  If the leukopenia gets progressive worse in the future, I might have to delay her treatment or adjust the chemotherapy dose.     Vertebral compression fracture She has severe pain but was unable to tolerate narcotics due to severe constipation. She is doing well with tramadol. Hopefully, with the addition of prednisone, this had helped with bone pain. I recommend she discontinue meloxicam while on prednisone We will proceed with prednisone taper over the next 2 weeks.     Depression Overall, her depression and adjustment disorder has improved. I recommend she continues on Remeron.    Orders Placed This Encounter  Procedures  . SPEP & IFE with QIG    Standing Status: Future     Number of Occurrences:      Standing Expiration Date: 10/24/2015  . Kappa/lambda light chains    Standing Status: Future     Number of Occurrences:      Standing Expiration Date: 10/24/2015  . Beta 2 microglobulin, serum    Standing Status: Future     Number of Occurrences:      Standing Expiration Date: 10/24/2015   All questions were answered. The patient knows to call the clinic with any problems, questions  or concerns. No barriers to learning was detected. I spent 25 minutes counseling the patient face to face. The total time spent in the appointment was 30 minutes and more than 50% was on counseling and review of test results     [facility], [place], MD 09/20/2014 11:09 AM

## 2014-09-20 NOTE — Assessment & Plan Note (Signed)
This is likely anemia of chronic disease. The patient denies recent history of bleeding such as epistaxis, hematuria or hematochezia. She is asymptomatic from the anemia. We will observe for now.  

## 2014-09-20 NOTE — ED Provider Notes (Signed)
Medical screening examination/treatment/procedure(s) were conducted as a shared visit with non-physician practitioner(s) and myself.  I personally evaluated the patient during the encounter.   Per pt, c/o sudden onset and persistence of multiple intermittent episodes of chest "pain" for the past several weeks. Pt states she has had 4 separate episodes, occuring at night only, but not every night. Pt describes the pain as "sharp" right sided chest pain, which radiates into her right neck and right upper arm. Pt states her symptoms last for 15 minutes before resolving. Pt states she had an episode today while working out in her yard, which concerned her because it lasted for 20 to 25 minutes. Pt states she called her Cards MD, then came to the ED for further evaluation. Denies symptoms currently. Denies palpitations, no SOB/cough, no abd pain, no N/V/D, no back pain, no fevers, no injury. VSS, NAD, CTA, RRR, abd soft/NT, neuro non-focal. Workup reassuring. CP with typical and atypical features. No previous hx of provocative cardiac testing. CT-A chest 07/2014 with coronary artery calcifications. Will observation admit. 2305:  T/C to Cardiology Dr. Philbert Riser, case discussed, including:  HPI, pertinent PM/SHx, VS/PE, dx testing, ED course and treatment:  Agrees pt will need some type of provocative cardiac testing, able to consult, requests to admit to medicine service.  T/C to [facility] Dr. Posey Pronto, case discussed, including:  HPI, pertinent PM/SHx, VS/PE, dx testing, ED course and treatment:  Agreeable to admit, requests to write temporary orders, obtain observation tele bed to team MCAdmits.    EKG Interpretation   Date/Time:  Thursday September 20 2014 17:36:57 EDT Ventricular Rate:  61 PR Interval:  168 QRS Duration: 132 QT Interval:  456 QTC Calculation: 459 R Axis:   -40 Text Interpretation:  Normal sinus rhythm Left axis deviation Left bundle  branch block When compared with ECG of 09/21/2012 No significant  change was  found Confirmed by [facility]  MD, Nunzio Cory [phone]) on 09/20/2014 9:46:28 PM      Results for orders placed or performed during the hospital encounter of 09/20/14  CBC  Result Value Ref Range   WBC 3.9 (L) 4.0 - 10.5 K/uL   RBC 3.41 (L) 3.87 - 5.11 MIL/uL   Hemoglobin 11.1 (L) 12.0 - 15.0 g/dL   HCT 33.3 (L) 36.0 - 46.0 %   MCV 97.7 78.0 - 100.0 fL   MCH 32.6 26.0 - 34.0 pg   MCHC 33.3 30.0 - 36.0 g/dL   RDW 15.6 (H) 11.5 - 15.5 %   Platelets 243 150 - 400 K/uL  Comprehensive metabolic panel  Result Value Ref Range   Sodium 134 (L) 135 - 145 mmol/L   Potassium 3.9 3.5 - 5.1 mmol/L   Chloride 99 96 - 112 mmol/L   CO2 28 19 - 32 mmol/L   Glucose, Bld 91 70 - 99 mg/dL   BUN 16 6 - 23 mg/dL   Creatinine, Ser 0.86 0.50 - 1.10 mg/dL   Calcium 8.5 8.4 - 10.5 mg/dL   Total Protein 7.0 6.0 - 8.3 g/dL   Albumin 3.4 (L) 3.5 - 5.2 g/dL   AST 28 0 - 37 U/L   ALT 21 0 - 35 U/L   Alkaline Phosphatase 56 39 - 117 U/L   Total Bilirubin 0.5 0.3 - 1.2 mg/dL   GFR calc non Af Amer 59 (L) >90 mL/min   GFR calc Af Amer 69 (L) >90 mL/min   Anion gap 7 5 - 15  I-stat troponin, ED (not at [facility])  Result Value Ref Range   Troponin i, poc 0.01 0.00 - 0.08 ng/mL   Comment 3           Dg Chest 2 View 09/20/2014   CLINICAL DATA:  Acute onset chest pain radiating to right arm and jaw. Multiple myeloma.  EXAM: CHEST  2 VIEW  COMPARISON:  08/01/2014  FINDINGS: Mild cardiomegaly stable. Pulmonary hyperinflation again seen, consistent with COPD. Pleural-parenchymal scarring at the right lung base is unchanged. No evidence of acute infiltrate or edema. No evidence of pleural effusion. No definite mass or lymphadenopathy identified  Right-sided Port-A-Cath remains in appropriate position. Old lower thoracic vertebral body compression fracture and vertebroplasty again noted.  IMPRESSION: Stable cardiomegaly and COPD.  No active disease.   Electronically Signed   By: Earle Gell M.D.   On: 09/20/2014 18:49       Francine Graven, DO 09/22/14 [phone]

## 2014-09-20 NOTE — Assessment & Plan Note (Signed)
This is likely due to recent treatment. The patient denies recent history of fevers, cough, chills, diarrhea or dysuria. She is asymptomatic from the leukopenia. I will observe for now.  I will continue the chemotherapy at current dose without dosage adjustment.  If the leukopenia gets progressive worse in the future, I might have to delay her treatment or adjust the chemotherapy dose.   

## 2014-09-20 NOTE — ED Provider Notes (Signed)
CSN: [id number]     Arrival date & time 09/20/14  1732 History   First MD Initiated Contact with Patient 09/20/14 2108     Chief Complaint  Patient presents with  . Chest Pain   HPI   64 YOF presents after 5 episodes or right sided chest pain. Pt states that she has woken up in the middle of the night with sharp chest pain that radiates into her right arm and neck, she notes associated belching. She takes 81 mg of asa continues to belch; resolution of symptoms after 20 minutes. She states that the episodes have become longer in duration.  She denies associated headaches, dizziness, nausea, vomiting, abdominal pain, fever. This has happened 4 times at night with one episode during the day. She states that after the episodes resolve she is not left with any remaining symptoms. She reports her only significant cardiac history as a "leaky" valve for which she takes Losartan. She denies a history of previous episodes similar to this; no history of GERD.   Past Medical History  Diagnosis Date  . Aortic regurgitation   . HTN (hypertension)   . Hot flash not due to menopause 12/23/2011  . Neuropathy 12/23/2011  . DCIS (ductal carcinoma in situ) 08/04/2012  . Breast cancer   . Multiple myeloma   . Depression 09/05/2014  . Spinal fracture of T12 vertebra    Past Surgical History  Procedure Laterality Date  . Lymph node excision - left groin    . Incisional hernia repair     Family History  Problem Relation Age of Onset  . Heart failure Mother   . Stroke Father    History  Substance Use Topics  . Smoking status: Never Smoker   . Smokeless tobacco: Never Used  . Alcohol Use: No   OB History    No data available     Review of Systems  All other systems reviewed and are negative.   Allergies  Review of patient's allergies indicates no known allergies.  Home Medications   Prior to Admission medications   Medication Sig Start Date End Date Taking? Authorizing Provider  acetaminophen  (TYLENOL) 325 MG tablet Take 650 mg by mouth every 6 (six) hours as needed (Pain).     Historical Provider, MD  aspirin 325 MG EC tablet Take 325 mg by mouth daily.    Historical Provider, MD  benzonatate (TESSALON) 200 MG capsule  08/15/14   Historical Provider, MD  cefdinir (OMNICEF) 300 MG capsule  08/15/14   Historical Provider, MD  cholecalciferol (VITAMIN D) 1000 UNITS tablet Take 1,000 Units by mouth daily. 05/01/13   Heath Lark, MD  diphenhydramine-acetaminophen (TYLENOL PM) 25-500 MG TABS Take 1 tablet by mouth at bedtime as needed (Sleep).     Historical Provider, MD  gabapentin (NEURONTIN) 300 MG capsule Take 1 capsule (300 mg total) by mouth 3 (three) times daily. Patient taking differently: Take 300 mg by mouth at bedtime.  10/13/13   Heath Lark, MD  HYDROmorphone (DILAUDID) 4 MG tablet  07/18/14   Historical Provider, MD  lenalidomide (REVLIMID) 10 MG capsule Take 1 capsule (10 mg total) by mouth daily. 09/19/14   Heath Lark, MD  levothyroxine (SYNTHROID, LEVOTHROID) 50 MCG tablet Take 50 mcg by mouth daily before breakfast.    Historical Provider, MD  lidocaine-prilocaine (EMLA) cream Apply 1 application topically as needed. Apply to [facility] a cath site at least one hour prior to [employer]. 06/21/14   Heath Lark, MD  loperamide (IMODIUM A-D) 2 MG tablet Take 2 mg by mouth as needed for diarrhea or loose stools (as directed.).    Historical Provider, MD  LORazepam (ATIVAN) 0.5 MG tablet TAKE ONE TABLET BY MOUTH TWICE DAILY AS NEEDED FOR ANXIETY  Patient not taking: Reported on 09/19/2014 08/30/14   Heath Lark, MD  mirtazapine (REMERON) 7.5 MG tablet Take 1 tablet (7.5 mg total) by mouth at bedtime. 09/05/14   Heath Lark, MD  oxyCODONE (OXY IR/ROXICODONE) 5 MG immediate release tablet  08/05/14   Historical Provider, MD  predniSONE (DELTASONE) 5 MG tablet Take 1 tablet (5 mg total) by mouth daily with breakfast. 09/05/14   Heath Lark, MD  PRESCRIPTION MEDICATION Chemo at [facility]    Historical  Provider, MD  traMADol (ULTRAM) 50 MG tablet Take 1 tablet (50 mg total) by mouth every 6 (six) hours as needed for moderate pain. 09/05/14   Heath Lark, MD  valsartan (DIOVAN) 160 MG tablet Take 1 tablet by mouth daily. Needs office visit. 08/28/14   Josue Hector, MD   BP 132/65 mmHg  Pulse 52  Temp(Src) 97.1 F (36.2 C) (Oral)  Resp 11  Ht 5' 5"  (1.651 m)  Wt 122 lb (55.339 kg)  BMI 20.30 kg/m2  SpO2 100% Physical Exam  Constitutional: She is oriented to person, place, and time. She appears well-developed and well-nourished.  HENT:  Head: Normocephalic and atraumatic.  Eyes: Pupils are equal, round, and reactive to light.  Neck: Normal range of motion. Neck supple. No JVD present. No tracheal deviation present. No thyromegaly present.  Cardiovascular: Normal rate, regular rhythm, normal heart sounds and intact distal pulses.  Exam reveals no gallop and no friction rub.   No murmur heard. Pulmonary/Chest: Effort normal and breath sounds normal. No stridor. No respiratory distress. She has no wheezes. She has no rales. She exhibits no tenderness.  Port noted to right anterior chest wall. No signs of infection. No signs of rash, bruising, trauma to chest wall. No tenderness to palpation, A/P compression normal.   Abdominal: Soft. Bowel sounds are normal. She exhibits no distension and no mass. There is no tenderness. There is no rebound and no guarding.  Musculoskeletal: Normal range of motion.  Lymphadenopathy:    She has no cervical adenopathy.  Neurological: She is alert and oriented to person, place, and time. Coordination normal.  Skin: Skin is warm and dry.  Psychiatric: She has a normal mood and affect. Her behavior is normal. Judgment and thought content normal.  Nursing note and vitals reviewed.   ED Course  Procedures (including critical care time) Labs Review Labs Reviewed  CBC - Abnormal; Notable for the following:    WBC 3.9 (*)    RBC 3.41 (*)    Hemoglobin 11.1  (*)    HCT 33.3 (*)    RDW 15.6 (*)    All other components within normal limits  COMPREHENSIVE METABOLIC PANEL - Abnormal; Notable for the following:    Sodium 134 (*)    Albumin 3.4 (*)    GFR calc non Af Amer 59 (*)    GFR calc Af Amer 69 (*)    All other components within normal limits  I-STAT TROPOININ, ED    Imaging Review Dg Chest 2 View  09/20/2014   CLINICAL DATA:  Acute onset chest pain radiating to right arm and jaw. Multiple myeloma.  EXAM: CHEST  2 VIEW  COMPARISON:  08/01/2014  FINDINGS: Mild cardiomegaly stable. Pulmonary hyperinflation again seen, consistent  with COPD. Pleural-parenchymal scarring at the right lung base is unchanged. No evidence of acute infiltrate or edema. No evidence of pleural effusion. No definite mass or lymphadenopathy identified  Right-sided Port-A-Cath remains in appropriate position. Old lower thoracic vertebral body compression fracture and vertebroplasty again noted.  IMPRESSION: Stable cardiomegaly and COPD.  No active disease.   Electronically Signed   By: Earle Gell M.D.   On: 09/20/2014 18:49     EKG Interpretation   Date/Time:  Thursday September 20 2014 17:36:57 EDT Ventricular Rate:  61 PR Interval:  168 QRS Duration: 132 QT Interval:  456 QTC Calculation: 459 R Axis:   -40 Text Interpretation:  Normal sinus rhythm Left axis deviation Left bundle  branch block When compared with ECG of 09/21/2012 No significant change was  found Confirmed by [facility]  MD, Nunzio Cory [phone]) on 09/20/2014 9:46:28 PM       MDM   Final diagnoses:  Chest pain, unspecified chest pain type    Labs: troponin, CBC, CMP- no significant findings  Imaging:DG chest 2 view stable cardiomegaly and COPD. No active disease  Consults: Cardiology/ Hospitalist service  Assessment/Plan: Chest Pain-  Concern for unstable angina with with worsening symptoms. Pts care was discussed with hospitalist service and cardiology. It was felt that observation overnight was  in order. Pt was stable throughout stay in ED.       Okey Regal, PA-C 09/21/14 [place], DO 09/22/14 [phone]

## 2014-09-20 NOTE — Telephone Encounter (Signed)
SPOKE  WITH PT   COMPLAINING  WITH  RIGHT  SIDED  CHEST  PAIN  THAT  RADIATES  TO SHOULDER  ,NECK  AND  DOWN  TO  ELBOW HAS  OCCURRED  4  TIMES  OVER   A  3-4 WEEK PERIOD  HAPPENS  DURING  NIGHTTIME  HOURS  AND  PT   CAN  WALK  AROUND  AND  BURPS  AND  FEELS  BETTER HAS  CHANGED  DIET  RECENTLY   WAS EATING ICE CREAM  LATE  IN DAY   SINCE  HAS STOPPED  DID HAVE  PEPSI LAST  NIGHT  WITH LATEST EPISODE  WILL  FORWARD TO DR Johnsie Cancel FOR  REVIEW .Adonis Housekeeper

## 2014-09-20 NOTE — ED Notes (Signed)
MD at bedside. 

## 2014-09-21 ENCOUNTER — Ambulatory Visit (HOSPITAL_COMMUNITY): Payer: Medicare Other

## 2014-09-21 ENCOUNTER — Other Ambulatory Visit: Payer: Self-pay

## 2014-09-21 ENCOUNTER — Observation Stay (HOSPITAL_COMMUNITY): Payer: Medicare Other

## 2014-09-21 DIAGNOSIS — R072 Precordial pain: Secondary | ICD-10-CM | POA: Diagnosis not present

## 2014-09-21 DIAGNOSIS — M4850XS Collapsed vertebra, not elsewhere classified, site unspecified, sequela of fracture: Secondary | ICD-10-CM

## 2014-09-21 DIAGNOSIS — C9000 Multiple myeloma not having achieved remission: Secondary | ICD-10-CM

## 2014-09-21 DIAGNOSIS — R079 Chest pain, unspecified: Secondary | ICD-10-CM

## 2014-09-21 DIAGNOSIS — G629 Polyneuropathy, unspecified: Secondary | ICD-10-CM | POA: Diagnosis not present

## 2014-09-21 DIAGNOSIS — R0789 Other chest pain: Secondary | ICD-10-CM | POA: Diagnosis not present

## 2014-09-21 LAB — COMPREHENSIVE METABOLIC PANEL
ALK PHOS: 43 U/L (ref 39–117)
ALT: 17 U/L (ref 0–35)
AST: 22 U/L (ref 0–37)
Albumin: 3 g/dL — ABNORMAL LOW (ref 3.5–5.2)
Anion gap: 5 (ref 5–15)
BUN: 11 mg/dL (ref 6–23)
CO2: 30 mmol/L (ref 19–32)
Calcium: 8.3 mg/dL — ABNORMAL LOW (ref 8.4–10.5)
Chloride: 103 mmol/L (ref 96–112)
Creatinine, Ser: 0.77 mg/dL (ref 0.50–1.10)
GFR calc Af Amer: 86 mL/min — ABNORMAL LOW (ref >90)
GFR calc non Af Amer: 74 mL/min — ABNORMAL LOW (ref >90)
Glucose, Bld: 76 mg/dL (ref 70–99)
Potassium: 4.1 mmol/L (ref 3.5–5.1)
SODIUM: 138 mmol/L (ref 135–145)
Total Bilirubin: 0.7 mg/dL (ref 0.3–1.2)
Total Protein: 5.7 g/dL — ABNORMAL LOW (ref 6.0–8.3)

## 2014-09-21 LAB — CBC WITH DIFFERENTIAL/PLATELET
Basophils Absolute: 0 10*3/uL (ref 0.0–0.1)
Basophils Relative: 1 % (ref 0–1)
Eosinophils Absolute: 0 10*3/uL (ref 0.0–0.7)
Eosinophils Relative: 1 % (ref 0–5)
HEMATOCRIT: 30.2 % — AB (ref 36.0–46.0)
Hemoglobin: 10 g/dL — ABNORMAL LOW (ref 12.0–15.0)
LYMPHS ABS: 1.4 10*3/uL (ref 0.7–4.0)
LYMPHS PCT: 48 % — AB (ref 12–46)
MCH: 32.3 pg (ref 26.0–34.0)
MCHC: 33.1 g/dL (ref 30.0–36.0)
MCV: 97.4 fL (ref 78.0–100.0)
Monocytes Absolute: 0.3 10*3/uL (ref 0.1–1.0)
Monocytes Relative: 11 % (ref 3–12)
Neutro Abs: 1.1 10*3/uL — ABNORMAL LOW (ref 1.7–7.7)
Neutrophils Relative %: 39 % — ABNORMAL LOW (ref 43–77)
Platelets: 217 10*3/uL (ref 150–400)
RBC: 3.1 MIL/uL — AB (ref 3.87–5.11)
RDW: 15.6 % — ABNORMAL HIGH (ref 11.5–15.5)
WBC: 2.8 10*3/uL — AB (ref 4.0–10.5)

## 2014-09-21 LAB — SPEP & IFE WITH QIG
ALPHA-1-GLOBULIN: 4.2 % (ref 2.9–4.9)
Albumin ELP: 56.1 % (ref 55.8–66.1)
Alpha-2-Globulin: 11.2 % (ref 7.1–11.8)
BETA 2: 2.9 % — AB (ref 3.2–6.5)
BETA GLOBULIN: 5.8 % (ref 4.7–7.2)
Gamma Globulin: 19.8 % — ABNORMAL HIGH (ref 11.1–18.8)
IgA: 47 mg/dL — ABNORMAL LOW (ref 69–380)
IgG (Immunoglobin G), Serum: 1450 mg/dL (ref 690–1700)
IgM, Serum: 103 mg/dL (ref 52–322)
M-Spike, %: 0.95 g/dL
Total Protein, Serum Electrophoresis: 6.5 g/dL (ref 6.0–8.3)

## 2014-09-21 LAB — TROPONIN I
Troponin I: <0.03 ng/mL (ref <0.031)
Troponin I: <0.03 ng/mL (ref <0.031)

## 2014-09-21 LAB — PROTIME-INR
INR: 1.02 (ref 0.00–1.49)
Prothrombin Time: 13.5 seconds (ref 11.6–15.2)

## 2014-09-21 LAB — KAPPA/LAMBDA LIGHT CHAINS
KAPPA LAMBDA RATIO: 4.44 — AB (ref 0.26–1.65)
Kappa free light chain: 8.84 mg/dL — ABNORMAL HIGH (ref 0.33–1.94)
Lambda Free Lght Chn: 1.99 mg/dL (ref 0.57–2.63)

## 2014-09-21 MED ORDER — TRAMADOL HCL 50 MG PO TABS: 50.0000 mg | ORAL_TABLET | Freq: Four times a day (QID) | ORAL | Status: DC | PRN

## 2014-09-21 MED ORDER — GABAPENTIN 300 MG PO CAPS
300.0000 mg | ORAL_CAPSULE | Freq: Every day | ORAL | Status: DC
  Administered 2014-09-21: 300 mg via ORAL
  Filled 2014-09-21 (×2): qty 1

## 2014-09-21 MED ORDER — ACETAMINOPHEN 325 MG PO TABS: 650.0000 mg | ORAL_TABLET | Freq: Four times a day (QID) | ORAL | Status: DC | PRN

## 2014-09-21 MED ORDER — REGADENOSON 0.4 MG/5ML IV SOLN
INTRAVENOUS | Status: AC
  Administered 2014-09-21: 0.4 mg via INTRAVENOUS
  Filled 2014-09-21: qty 5

## 2014-09-21 MED ORDER — PANTOPRAZOLE SODIUM 40 MG PO TBEC
40.0000 mg | DELAYED_RELEASE_TABLET | Freq: Every day | ORAL | Status: DC
  Administered 2014-09-21: 40 mg via ORAL
  Filled 2014-09-21: qty 1

## 2014-09-21 MED ORDER — ACETAMINOPHEN 325 MG PO TABS: 650.0000 mg | ORAL_TABLET | ORAL | Status: DC | PRN

## 2014-09-21 MED ORDER — TECHNETIUM TC 99M SESTAMIBI GENERIC - CARDIOLITE
30.0000 | Freq: Once | INTRAVENOUS | Status: AC | PRN
  Administered 2014-09-21: 30 via INTRAVENOUS

## 2014-09-21 MED ORDER — LEVOTHYROXINE SODIUM 50 MCG PO TABS
50.0000 ug | ORAL_TABLET | Freq: Every day | ORAL | Status: DC
  Administered 2014-09-21: 50 ug via ORAL
  Filled 2014-09-21 (×2): qty 1

## 2014-09-21 MED ORDER — ENOXAPARIN SODIUM 40 MG/0.4ML ~~LOC~~ SOLN
40.0000 mg | Freq: Every day | SUBCUTANEOUS | Status: DC
  Administered 2014-09-21: 40 mg via SUBCUTANEOUS
  Filled 2014-09-21: qty 0.4

## 2014-09-21 MED ORDER — IRBESARTAN 150 MG PO TABS
150.0000 mg | ORAL_TABLET | Freq: Every day | ORAL | Status: DC
  Filled 2014-09-21: qty 1

## 2014-09-21 MED ORDER — REGADENOSON 0.4 MG/5ML IV SOLN
0.4000 mg | Freq: Once | INTRAVENOUS | Status: AC
  Administered 2014-09-21: 0.4 mg via INTRAVENOUS

## 2014-09-21 MED ORDER — ONDANSETRON HCL 4 MG/2ML IJ SOLN: 4.0000 mg | Freq: Four times a day (QID) | INTRAMUSCULAR | Status: DC | PRN

## 2014-09-21 MED ORDER — ALPRAZOLAM 0.25 MG PO TABS: 0.2500 mg | ORAL_TABLET | Freq: Two times a day (BID) | ORAL | Status: DC | PRN

## 2014-09-21 MED ORDER — LENALIDOMIDE 10 MG PO CAPS: 10.0000 mg | ORAL_CAPSULE | Freq: Every day | ORAL | Status: DC

## 2014-09-21 MED ORDER — PANTOPRAZOLE SODIUM 40 MG PO TBEC: 40.0000 mg | DELAYED_RELEASE_TABLET | Freq: Every day | ORAL | Status: DC

## 2014-09-21 MED ORDER — ASPIRIN EC 325 MG PO TBEC
325.0000 mg | DELAYED_RELEASE_TABLET | Freq: Every day | ORAL | Status: DC
  Filled 2014-09-21: qty 1

## 2014-09-21 MED ORDER — TECHNETIUM TC 99M SESTAMIBI GENERIC - CARDIOLITE
10.0000 | Freq: Once | INTRAVENOUS | Status: AC | PRN
  Administered 2014-09-21: 10 via INTRAVENOUS

## 2014-09-21 NOTE — Consult Note (Addendum)
Referring Physician: Dr. Posey Pronto Primary Physician:  Primary Cardiologist: Dr. Johnsie Cancel Reason for Consultation: chest pain   HPI: [name] is a pleasant 81 yo man with h/o mild AI and mild to moderate MR followed by Dr. Johnsie Cancel with no prior ischemic events who presents with CP.  She states symptoms started about 2-3 weeks ago.  She has had a total of 5 episodes.  Typically last 15 minutes or so.  States the first 4 episodes occurred at night.  Described as right sided, sharp chest pain radiating to neck and shoulder. She got up and walked around and had the sensation of needing to belch.  After a few minutes and belching, symptoms resolved on their own.  Walking nor position caused change in symptoms.  She presented to the ED today because she had a similar episode, however, this occurred during the day while out in the yard watering plants with a hose.  She went inside and symptoms progressively got worse in intensity.  This episode also lasted longer until arrival to ED for a total of almost 30 minutes.  She reports being rather functional until Jan at which time she had a vertebral fracture and has since had a vertebroplasty.  Since then, she has been somewhat limited on the amount of activity because of ongoing back pain, however, she denies any previous cardiopulmonary limitations.      Review of Systems:     Cardiac Review of Systems: {Y] = yes [ ]  = no  Chest Pain [ x  ]  Resting SOB [   ] Exertional SOB  [  ]  Orthopnea [  ]   Pedal Edema [   ]    Palpitations [  ] Syncope  [  ]   Presyncope [   ]  General Review of Systems: [Y] = yes [  ]=no Constitional: recent weight change [  ]; anorexia [  ]; fatigue [ x ]; nausea [  ]; night sweats [  ]; fever [  ]; or chills [  ];                                                                      Eyes : blurred vision [  ]; diplopia [   ]; vision changes [  ];  Amaurosis fugax[  ]; Resp: cough [  ];  wheezing[  ];  hemoptysis[  ];  PND [  ];    GI:  gallstones[  ], vomiting[  ];  dysphagia[  ]; melena[  ];  hematochezia [  ]; heartburn[  ];   GU: kidney stones [  ]; hematuria[  ];   dysuria [  ];  nocturia[  ]; incontinence [  ];             Skin: rash, swelling[  ];, hair loss[  ];  peripheral edema[  ];  or itching[  ]; Musculosketetal: myalgias[  ];  joint swelling[  ];  joint erythema[  ];  joint pain[  ];  back pain[ x ];  Heme/Lymph: bruising[  ];  bleeding[  ];  anemia[  ];  Neuro: TIA[  ];  headaches[  ];  stroke[  ];  vertigo[  ];  seizures[  ];   paresthesias[  ];  difficulty walking[  ];  Psych:depression[  ]; anxiety[  ];  Endocrine: diabetes[  ];  thyroid dysfunction[  ];  Other:  Past Medical History  Diagnosis Date  . Aortic regurgitation   . HTN (hypertension)   . Hot flash not due to menopause 12/23/2011  . Neuropathy 12/23/2011  . DCIS (ductal carcinoma in situ) 08/04/2012  . Breast cancer   . Multiple myeloma   . Depression 09/05/2014  . Spinal fracture of T12 vertebra     vertebral fx  . Coronary artery disease     Medications Prior to Admission  Medication Sig Dispense Refill  . acetaminophen (TYLENOL) 325 MG tablet Take 650 mg by mouth every 6 (six) hours as needed for mild pain.     [name] aspirin 325 MG EC tablet Take 325 mg by mouth daily.    . cholecalciferol (VITAMIN D) 1000 UNITS tablet Take 1,000 Units by mouth daily.    . diphenhydramine-acetaminophen (TYLENOL PM) 25-500 MG TABS Take 1 tablet by mouth at bedtime as needed (Sleep).     . gabapentin (NEURONTIN) 300 MG capsule Take 1 capsule (300 mg total) by mouth 3 (three) times daily. (Patient taking differently: Take 300 mg by mouth at bedtime. ) 90 capsule 3  . lenalidomide (REVLIMID) 10 MG capsule Take 1 capsule (10 mg total) by mouth daily. 21 capsule 0  . levothyroxine (SYNTHROID, LEVOTHROID) 50 MCG tablet Take 50 mcg by mouth daily before breakfast.    . lidocaine-prilocaine (EMLA) cream Apply 1 application topically as needed. Apply to [facility] a  cath site at least one hour prior to [employer]. 30 g 2  . loperamide (IMODIUM A-D) 2 MG tablet Take 2 mg by mouth as needed for diarrhea or loose stools (as directed.).    [name] Multiple Vitamin (MULTIVITAMIN WITH MINERALS) TABS tablet Take 1 tablet by mouth daily.    . Polyvinyl Alcohol-Povidone (REFRESH OP) Place 2 drops into both eyes 2 (two) times daily as needed (dry eyes).    . traMADol (ULTRAM) 50 MG tablet Take 1 tablet (50 mg total) by mouth every 6 (six) hours as needed for moderate pain. 60 tablet 0  . valsartan (DIOVAN) 160 MG tablet Take 1 tablet by mouth daily. Needs office visit. 30 tablet 0  . benzonatate (TESSALON) 200 MG capsule     . cefdinir (OMNICEF) 300 MG capsule     . HYDROmorphone (DILAUDID) 4 MG tablet     . LORazepam (ATIVAN) 0.5 MG tablet TAKE ONE TABLET BY MOUTH TWICE DAILY AS NEEDED FOR ANXIETY  (Patient not taking: Reported on 09/19/2014) 30 tablet 0  . mirtazapine (REMERON) 7.5 MG tablet Take 1 tablet (7.5 mg total) by mouth at bedtime. (Patient not taking: Reported on 09/20/2014) 30 tablet 3  . oxyCODONE (OXY IR/ROXICODONE) 5 MG immediate release tablet     . predniSONE (DELTASONE) 5 MG tablet Take 1 tablet (5 mg total) by mouth daily with breakfast. (Patient not taking: Reported on 09/20/2014) 30 tablet 3  . PRESCRIPTION MEDICATION Chemo at [facility]       . aspirin  325 mg Oral Daily  . enoxaparin (LOVENOX) injection  40 mg Subcutaneous Daily  . gabapentin  300 mg Oral QHS  . irbesartan  150 mg Oral Daily  . [START ON 09/25/2014] lenalidomide  10 mg Oral Daily  . levothyroxine  50 mcg Oral QAC breakfast    Infusions:    No Known Allergies  History   Social History  . Marital Status: Married    Spouse Name: N/A  . Number of Children: N/A  . Years of Education: N/A   Occupational History  . Not on file.   Social History Main Topics  . Smoking status: Never Smoker   . Smokeless tobacco: Never Used  . Alcohol Use: No  . Drug Use: No  . Sexual  Activity: No   Other Topics Concern  . Not on file   Social History Narrative    Family History  Problem Relation Age of Onset  . Heart failure Mother   . Stroke Father     PHYSICAL EXAM: Filed Vitals:   09/21/14 0158  BP: 157/56  Pulse: 64  Temp: 98.2 F (36.8 C)  Resp: 18    No intake or output data in the 24 hours ending 09/21/14 0235  General:  Well appearing, elderly, pleasant woman. No respiratory difficulty HEENT: normal Neck: supple. no JVD. Carotids 2+ bilat; no bruits. No lymphadenopathy or thryomegaly appreciated. Cor: PMI nondisplaced. Regular rate & rhythm. No rubs, gallops or murmurs. Lungs: clear Abdomen: soft, nontender, nondistended. No hepatosplenomegaly. No bruits or masses. Good bowel sounds. Extremities: no cyanosis, clubbing, rash, edema Neuro: alert & oriented x 3, cranial nerves grossly intact. moves all 4 extremities w/o difficulty. Affect pleasant.  ECG: NSR with LBBB, when compared to previous rate has increased.   Results for orders placed or performed during the hospital encounter of 09/20/14 (from the past 24 hour(s))  CBC     Status: Abnormal   Collection Time: 09/20/14  5:43 PM  Result Value Ref Range   WBC 3.9 (L) 4.0 - 10.5 K/uL   RBC 3.41 (L) 3.87 - 5.11 MIL/uL   Hemoglobin 11.1 (L) 12.0 - 15.0 g/dL   HCT 33.3 (L) 36.0 - 46.0 %   MCV 97.7 78.0 - 100.0 fL   MCH 32.6 26.0 - 34.0 pg   MCHC 33.3 30.0 - 36.0 g/dL   RDW 15.6 (H) 11.5 - 15.5 %   Platelets 243 150 - 400 K/uL  Comprehensive metabolic panel     Status: Abnormal   Collection Time: 09/20/14  5:43 PM  Result Value Ref Range   Sodium 134 (L) 135 - 145 mmol/L   Potassium 3.9 3.5 - 5.1 mmol/L   Chloride 99 96 - 112 mmol/L   CO2 28 19 - 32 mmol/L   Glucose, Bld 91 70 - 99 mg/dL   BUN 16 6 - 23 mg/dL   Creatinine, Ser 0.86 0.50 - 1.10 mg/dL   Calcium 8.5 8.4 - 10.5 mg/dL   Total Protein 7.0 6.0 - 8.3 g/dL   Albumin 3.4 (L) 3.5 - 5.2 g/dL   AST 28 0 - 37 U/L   ALT 21 0 -  35 U/L   Alkaline Phosphatase 56 39 - 117 U/L   Total Bilirubin 0.5 0.3 - 1.2 mg/dL   GFR calc non Af Amer 59 (L) >90 mL/min   GFR calc Af Amer 69 (L) >90 mL/min   Anion gap 7 5 - 15  I-stat troponin, ED (not at [facility])     Status: None   Collection Time: 09/20/14  6:18 PM  Result Value Ref Range   Troponin i, poc 0.01 0.00 - 0.08 ng/mL   Comment 3           Dg Chest 2 View  09/20/2014   CLINICAL DATA:  Acute onset chest pain radiating to right arm and jaw.  Multiple myeloma.  EXAM: CHEST  2 VIEW  COMPARISON:  08/01/2014  FINDINGS: Mild cardiomegaly stable. Pulmonary hyperinflation again seen, consistent with COPD. Pleural-parenchymal scarring at the right lung base is unchanged. No evidence of acute infiltrate or edema. No evidence of pleural effusion. No definite mass or lymphadenopathy identified  Right-sided Port-A-Cath remains in appropriate position. Old lower thoracic vertebral body compression fracture and vertebroplasty again noted.  IMPRESSION: Stable cardiomegaly and COPD.  No active disease.   Electronically Signed   By: Earle Gell M.D.   On: 09/20/2014 18:49     ASSESSMENT: 81 yo woman with h/o HTN, mild AI, mild-mod MR who presents with chest pain.  Her symptomatology has both typical and atypical components, more concerning in an elderly woman.  Her ECG, although with a LBBB, is unchanged.  Her initial troponin is negative, albeit within a short amount of time from symptoms onset.  Her valvular heart disease is unlikely to be contributing to current symptomatology and will likely not require further investigation during this admission.  She is slightly hypertensive today and will need to ensure this is well controlled.   PLAN/DISCUSSION: Cycle troponins, would expect a mild bump if this was ischemic pain with a 30 minute duration Consider risk stratifying with A1c and Lipids If rules in with elevated troponins will likely need LHC.  However, if troponins negative, will likely  consider non-invasive ischemic evaluation for further risk stratification.  Given her recent vertebral fracture and ongoing pain/discomfort, will likely need pharmacologic stress modality.  Would likely consider Lexiscan SPECT MPI but will defer to morning team.   [facility] 09/21/2014 2:58 AM

## 2014-09-21 NOTE — ED Notes (Signed)
Attempted to call report

## 2014-09-21 NOTE — Progress Notes (Signed)
Patient Name: Natasha Jackson Date of Encounter: 09/21/2014     Principal Problem:   Chest pain Active Problems:   Multiple myeloma   Neuropathy   DCIS (ductal carcinoma in situ)   Vertebral compression fracture    SUBJECTIVE  The patient has had a good night.  She has had no further episodes of her right-sided precordial chest discomfort.  She has normal troponins.  CURRENT MEDS . aspirin  325 mg Oral Daily  . enoxaparin (LOVENOX) injection  40 mg Subcutaneous Daily  . gabapentin  300 mg Oral QHS  . irbesartan  150 mg Oral Daily  . levothyroxine  50 mcg Oral QAC breakfast    OBJECTIVE  Filed Vitals:   09/21/14 0030 09/21/14 0045 09/21/14 0158 09/21/14 0500  BP: 118/47 130/69 157/56 119/40  Pulse: 55 64 64 96  Temp:   98.2 F (36.8 C) 98 F (36.7 C)  TempSrc:   Oral Oral  Resp: 14 15 18 18   Height:   5' 5"  (1.651 m)   Weight:   119 lb 11.2 oz (54.296 kg)   SpO2: 97% 98% 100% 97%    Intake/Output Summary (Last 24 hours) at 09/21/14 0859 Last data filed at 09/21/14 0718  Gross per 24 hour  Intake    100 ml  Output    550 ml  Net   -450 ml   Filed Weights   09/20/14 1747 09/21/14 0158  Weight: 122 lb (55.339 kg) 119 lb 11.2 oz (54.296 kg)    PHYSICAL EXAM  General: Pleasant, NAD. Neuro: Alert and oriented X 3. Moves all extremities spontaneously. Psych: Normal affect. HEENT:  Normal  Neck: Supple without bruits or JVD. Lungs:  Resp regular and unlabored, CTA. Heart: RRR no s3, s4, or murmurs. Abdomen: Soft, non-tender, non-distended, BS + x 4.  Extremities: No clubbing, cyanosis or edema. DP/PT/Radials 2+ and equal bilaterally.  Accessory Clinical Findings  CBC  Recent Labs  09/19/14 1113 09/20/14 1743 09/21/14 0725  WBC 2.9* 3.9* 2.8*  NEUTROABS 1.7  --  1.1*  HGB 10.6* 11.1* 10.0*  HCT 32.4* 33.3* 30.2*  MCV 99.7 97.7 97.4  PLT 210 243 944   Basic Metabolic Panel  Recent Labs  09/20/14 1743 09/21/14 0725  NA 134* 138  K 3.9 4.1    CL 99 103  CO2 28 30  GLUCOSE 91 76  BUN 16 11  CREATININE 0.86 0.77  CALCIUM 8.5 8.3*   Liver Function Tests  Recent Labs  09/20/14 1743 09/21/14 0725  AST 28 22  ALT 21 17  ALKPHOS 56 43  BILITOT 0.5 0.7  PROT 7.0 5.7*  ALBUMIN 3.4* 3.0*   No results for input(s): LIPASE, AMYLASE in the last 72 hours. Cardiac Enzymes  Recent Labs  09/21/14 0417 09/21/14 0725  TROPONINI <0.03 <0.03   BNP Invalid input(s): POCBNP D-Dimer No results for input(s): DDIMER in the last 72 hours. Hemoglobin A1C No results for input(s): HGBA1C in the last 72 hours. Fasting Lipid Panel No results for input(s): CHOL, HDL, LDLCALC, TRIG, CHOLHDL, LDLDIRECT in the last 72 hours. Thyroid Function Tests No results for input(s): TSH, T4TOTAL, T3FREE, THYROIDAB in the last 72 hours.  Invalid input(s): FREET3  TELE  Normal sinus rhythm  ECG  Normal sinus rhythm.  Interventricular conduction disturbance.  Radiology/Studies  Dg Chest 2 View  09/20/2014   CLINICAL DATA:  Acute onset chest pain radiating to right arm and jaw. Multiple myeloma.  EXAM: CHEST  2 VIEW  COMPARISON:  08/01/2014  FINDINGS: Mild cardiomegaly stable. Pulmonary hyperinflation again seen, consistent with COPD. Pleural-parenchymal scarring at the right lung base is unchanged. No evidence of acute infiltrate or edema. No evidence of pleural effusion. No definite mass or lymphadenopathy identified  Right-sided Port-A-Cath remains in appropriate position. Old lower thoracic vertebral body compression fracture and vertebroplasty again noted.  IMPRESSION: Stable cardiomegaly and COPD.  No active disease.   Electronically Signed   By: Earle Gell M.D.   On: 09/20/2014 18:49    ASSESSMENT AND PLAN  1.  Atypical chest discomfort of recent onset.  Enzymes negative 2.  Multiple myeloma with mild anemia 3.  Recent compression fracture of back from lifting a mattress.  Recent vertebroplasty 4.  History of mild left ventricular  systolic dysfunction by previous echo 2015  Disposition: We will get a Lexiscan Myoview stress test today.  If Stress test normal she can probably be discharged later today.  Signed, Darlin Coco MD

## 2014-09-21 NOTE — Discharge Instructions (Signed)

## 2014-09-21 NOTE — Progress Notes (Signed)
UR completed 

## 2014-09-21 NOTE — Discharge Summary (Signed)
Physician Discharge Summary  Natasha Jackson:[id number] DOB: 1925-07-19 DOA: 09/20/2014  PCP: Thressa Sheller, MD  Admit date: 09/20/2014 Discharge date: 09/21/2014  Time spent: 35 minutes  Recommendations for Outpatient Follow-up:  1. Follow up with PCP for further evaluation of chest pain if reoccurs.   Discharge Diagnoses:    Chest pain   Multiple myeloma   Neuropathy   DCIS (ductal carcinoma in situ)   Vertebral compression fracture   Discharge Condition: Stable.   Diet recommendation: Heart healthy  Filed Weights   09/20/14 1747 09/21/14 0158  Weight: 55.339 kg (122 lb) 54.296 kg (119 lb 11.2 oz)    History of present illness:  Natasha Jackson is a 81 y.o. female with Past medical history of aortic regurgitation, hypertension, neuropathy, multiple myeloma, coronary artery disease, vertebral fracture.. The patient presented with complaints of chest pain. The pain is located on the right side. Pain feels like a sharp pain and 4 episode has occurred at night which has woken her up from sleep. The pain radiates to her neck and shoulder on the right side. Sometimes leaning forward makes the pain better. She denies any fever or chills denies any cough denies any nausea or vomiting denies any acid reflux denies any diarrhea and denies any recent travel or leg swelling. Today while she was working in her yard she had similar symptoms but much worse in intensity and it lasted for roughly 30 minutes. The pain did not improve and lightheaded in the last time. She has been given nitroglycerin and aspirin. Currently she is chest pain-free.  Hospital Course:  1-Chest pain; resolved. No further episodes. Stress test normal. Start protonix.  2-MM; continue with chemo.  3-Pancytopenia; related to MM. Stable.   Procedures: Stress test; no evidence of ischemia.  Consultations:  cardiology  Discharge Exam: Filed Vitals:   09/21/14 1242  BP: 122/52  Pulse: 66  Temp: 98 F (36.7 C)   Resp: 16    General: Alert  in no distress.  Cardiovascular: S 1, S 2 RRR Respiratory: CTA  Discharge Instructions    Current Discharge Medication List    CONTINUE these medications which have NOT CHANGED   Details  acetaminophen (TYLENOL) 325 MG tablet Take 650 mg by mouth every 6 (six) hours as needed for mild pain.     aspirin 325 MG EC tablet Take 325 mg by mouth daily.   Associated Diagnoses: Multiple myeloma, without mention of having achieved remission    cholecalciferol (VITAMIN D) 1000 UNITS tablet Take 1,000 Units by mouth daily.    diphenhydramine-acetaminophen (TYLENOL PM) 25-500 MG TABS Take 1 tablet by mouth at bedtime as needed (Sleep).     gabapentin (NEURONTIN) 300 MG capsule Take 1 capsule (300 mg total) by mouth 3 (three) times daily. Qty: 90 capsule, Refills: 3   Associated Diagnoses: Multiple myeloma, without mention of having achieved remission; Neuropathy    lenalidomide (REVLIMID) 10 MG capsule Take 1 capsule (10 mg total) by mouth daily. Qty: 21 capsule, Refills: 0    levothyroxine (SYNTHROID, LEVOTHROID) 50 MCG tablet Take 50 mcg by mouth daily before breakfast.    lidocaine-prilocaine (EMLA) cream Apply 1 application topically as needed. Apply to [facility] a cath site at least one hour prior to [employer]. Qty: 30 g, Refills: 2    loperamide (IMODIUM A-D) 2 MG tablet Take 2 mg by mouth as needed for diarrhea or loose stools (as directed.).   Associated Diagnoses: Multiple myeloma, without mention of having achieved remission  Multiple Vitamin (MULTIVITAMIN WITH MINERALS) TABS tablet Take 1 tablet by mouth daily.    Polyvinyl Alcohol-Povidone (REFRESH OP) Place 2 drops into both eyes 2 (two) times daily as needed (dry eyes).    traMADol (ULTRAM) 50 MG tablet Take 1 tablet (50 mg total) by mouth every 6 (six) hours as needed for moderate pain. Qty: 60 tablet, Refills: 0   Associated Diagnoses: Multiple myeloma; Arthropathy    valsartan (DIOVAN)  160 MG tablet Take 1 tablet by mouth daily. Needs office visit. Qty: 30 tablet, Refills: 0    benzonatate (TESSALON) 200 MG capsule     cefdinir (OMNICEF) 300 MG capsule     HYDROmorphone (DILAUDID) 4 MG tablet     oxyCODONE (OXY IR/ROXICODONE) 5 MG immediate release tablet     predniSONE (DELTASONE) 5 MG tablet Take 1 tablet (5 mg total) by mouth daily with breakfast. Qty: 30 tablet, Refills: 3    PRESCRIPTION MEDICATION Chemo at [facility]      STOP taking these medications     LORazepam (ATIVAN) 0.5 MG tablet      mirtazapine (REMERON) 7.5 MG tablet        No Known Allergies Follow-up Information    Follow up with Jenkins Rouge, MD.   Specialty:  Cardiology   Why:  office will contact you   Contact information:   [address] [place] [address] [phone]        The results of significant diagnostics from this hospitalization (including imaging, microbiology, ancillary and laboratory) are listed below for reference.    Significant Diagnostic Studies: Dg Chest 2 View  09/20/2014   CLINICAL DATA:  Acute onset chest pain radiating to right arm and jaw. Multiple myeloma.  EXAM: CHEST  2 VIEW  COMPARISON:  08/01/2014  FINDINGS: Mild cardiomegaly stable. Pulmonary hyperinflation again seen, consistent with COPD. Pleural-parenchymal scarring at the right lung base is unchanged. No evidence of acute infiltrate or edema. No evidence of pleural effusion. No definite mass or lymphadenopathy identified  Right-sided Port-A-Cath remains in appropriate position. Old lower thoracic vertebral body compression fracture and vertebroplasty again noted.  IMPRESSION: Stable cardiomegaly and COPD.  No active disease.   Electronically Signed   By: Earle Gell M.D.   On: 09/20/2014 18:49    Microbiology: No results found for this or any previous visit (from the past 240 hour(s)).   Labs: Basic Metabolic Panel:  Recent Labs Lab 09/19/14 1113 09/20/14 1743  09/21/14 0725  NA 137 134* 138  K 4.0 3.9 4.1  CL  --  99 103  CO2 _0 GLUCOSE 88 91 76  BUN 15._1 CREATININE 0.7 0.86 0.77  CALCIUM 8.3* 8.5 8.3*   Liver Function Tests:  Recent Labs Lab 09/19/14 1113 09/20/14 1743 09/21/14 0725  AST _2 ALT _3 ALKPHOS 53 56 43  BILITOT 0.52 0.5 0.7  PROT 6.4 7.0 5.7*  ALBUMIN 3.2* 3.4* 3.0*   No results for input(s): LIPASE, AMYLASE in the last 168 hours. No results for input(s): AMMONIA in the last 168 hours. CBC:  Recent Labs Lab 09/19/14 1113 09/20/14 1743 09/21/14 0725  WBC 2.9* 3.9* 2.8*  NEUTROABS 1.7  --  1.1*  HGB 10.6* 11.1* 10.0*  HCT 32.4* 33.3* 30.2*  MCV 99.7 97.7 97.4  PLT 210 243 217   Cardiac Enzymes:  Recent Labs Lab 09/21/14 0417 09/21/14 0725 09/21/14 1410  TROPONINI <0.03 <0.03 <0.03  BNP: BNP (last 3 results) No results for input(s): BNP in the last 8760 hours.  ProBNP (last 3 results) No results for input(s): PROBNP in the last 8760 hours.  CBG: No results for input(s): GLUCAP in the last 168 hours.     Signed:  Niel Hummer A  [facility] 09/21/2014, 3:24 PM

## 2014-09-21 NOTE — H&P (Signed)
[facility] History and Physical  Patient: Natasha Jackson  MRN: [id number]  DOB: [birth year]  DOS: the patient was seen and examined on 09/21/2014 PCP: Thressa Sheller, MD  Chief Complaint: Chest pain  HPI: Natasha Jackson is a 81 y.o. female with Past medical history of aortic regurgitation, hypertension, neuropathy, multiple myeloma, coronary artery disease, vertebral fracture.. The patient presented with complaints of chest pain. The pain is located on the right side. Pain feels like a sharp pain and 4 episode has occurred at night which has woken her up from sleep. The pain radiates to her neck and shoulder on the right side. Sometimes leaning forward makes the pain better. She denies any fever or chills denies any cough denies any nausea or vomiting denies any acid reflux denies any diarrhea and denies any recent travel or leg swelling. Today while she was working in her yard she had similar symptoms but much worse in intensity and it lasted for roughly 30 minutes. The pain did not improve and lightheaded in the last time. She has been given nitroglycerin and aspirin. Currently she is chest pain-free.  The patient is coming from home. And at her baseline independent for most of her ADL.  Review of Systems: as mentioned in the history of present illness.  A Comprehensive review of the other systems is negative.  Past Medical History  Diagnosis Date  . Aortic regurgitation   . HTN (hypertension)   . Hot flash not due to menopause 12/23/2011  . Neuropathy 12/23/2011  . DCIS (ductal carcinoma in situ) 08/04/2012  . Breast cancer   . Multiple myeloma   . Depression 09/05/2014  . Spinal fracture of T12 vertebra     vertebral fx  . Coronary artery disease    Past Surgical History  Procedure Laterality Date  . Lymph node excision - left groin    . Incisional hernia repair     Social History:  reports that she has never smoked. She has never used smokeless tobacco. She reports that  she does not drink alcohol or use illicit drugs.  No Known Allergies  Family History  Problem Relation Age of Onset  . Heart failure Mother   . Stroke Father     Prior to Admission medications   Medication Sig Start Date End Date Taking? Authorizing Provider  acetaminophen (TYLENOL) 325 MG tablet Take 650 mg by mouth every 6 (six) hours as needed for mild pain.    Yes Historical Provider, MD  aspirin 325 MG EC tablet Take 325 mg by mouth daily.   Yes Historical Provider, MD  cholecalciferol (VITAMIN D) 1000 UNITS tablet Take 1,000 Units by mouth daily. 05/01/13  Yes Heath Lark, MD  diphenhydramine-acetaminophen (TYLENOL PM) 25-500 MG TABS Take 1 tablet by mouth at bedtime as needed (Sleep).    Yes Historical Provider, MD  gabapentin (NEURONTIN) 300 MG capsule Take 1 capsule (300 mg total) by mouth 3 (three) times daily. Patient taking differently: Take 300 mg by mouth at bedtime.  10/13/13  Yes Heath Lark, MD  lenalidomide (REVLIMID) 10 MG capsule Take 1 capsule (10 mg total) by mouth daily. 09/19/14  Yes Heath Lark, MD  levothyroxine (SYNTHROID, LEVOTHROID) 50 MCG tablet Take 50 mcg by mouth daily before breakfast.   Yes Historical Provider, MD  lidocaine-prilocaine (EMLA) cream Apply 1 application topically as needed. Apply to [facility] a cath site at least one hour prior to [employer]. 06/21/14  Yes Heath Lark, MD  loperamide (IMODIUM A-D) 2 MG  tablet Take 2 mg by mouth as needed for diarrhea or loose stools (as directed.).   Yes Historical Provider, MD  Multiple Vitamin (MULTIVITAMIN WITH MINERALS) TABS tablet Take 1 tablet by mouth daily.   Yes Historical Provider, MD  Polyvinyl Alcohol-Povidone (REFRESH OP) Place 2 drops into both eyes 2 (two) times daily as needed (dry eyes).   Yes Historical Provider, MD  traMADol (ULTRAM) 50 MG tablet Take 1 tablet (50 mg total) by mouth every 6 (six) hours as needed for moderate pain. 09/05/14  Yes Heath Lark, MD  valsartan (DIOVAN) 160 MG tablet Take 1  tablet by mouth daily. Needs office visit. 08/28/14  Yes Josue Hector, MD  benzonatate (TESSALON) 200 MG capsule  08/15/14   Historical Provider, MD  cefdinir (OMNICEF) 300 MG capsule  08/15/14   Historical Provider, MD  HYDROmorphone (DILAUDID) 4 MG tablet  07/18/14   Historical Provider, MD  LORazepam (ATIVAN) 0.5 MG tablet TAKE ONE TABLET BY MOUTH TWICE DAILY AS NEEDED FOR ANXIETY  Patient not taking: Reported on 09/19/2014 08/30/14   Heath Lark, MD  mirtazapine (REMERON) 7.5 MG tablet Take 1 tablet (7.5 mg total) by mouth at bedtime. Patient not taking: Reported on 09/20/2014 09/05/14   Heath Lark, MD  oxyCODONE (OXY IR/ROXICODONE) 5 MG immediate release tablet  08/05/14   Historical Provider, MD  predniSONE (DELTASONE) 5 MG tablet Take 1 tablet (5 mg total) by mouth daily with breakfast. Patient not taking: Reported on 09/20/2014 09/05/14   Heath Lark, MD  [place] [facility]    Historical Provider, MD    Physical Exam: Filed Vitals:   09/21/14 0030 09/21/14 0045 09/21/14 0158 09/21/14 0500  BP: 118/47 130/69 157/56 119/40  Pulse: 55 64 64 96  Temp:   98.2 F (36.8 C) 98 F (36.7 C)  TempSrc:   Oral Oral  Resp: _0 Height:   _1  (1.651 m)   Weight:   54.296 kg (119 lb 11.2 oz)   SpO2: 97% 98% 100% 97%    General: Alert, Awake and Oriented to Time, Place and Person. Appear in mild distress Eyes: PERRL ENT: Oral Mucosa clear moist. Neck: no JVD Cardiovascular: S1 and S2 Present, no Murmur, Peripheral Pulses Present Respiratory: Bilateral Air entry equal and Decreased, Clear to Auscultation, noCrackles, no wheezes Abdomen: Bowel Sound present, Soft and non tender Skin: no Rash Extremities: no Pedal edema, no calf tenderness Neurologic: Grossly no focal neuro deficit.  Labs on Admission:  CBC:  Recent Labs Lab 09/19/14 1113 09/20/14 1743  WBC 2.9* 3.9*  NEUTROABS 1.7  --   HGB 10.6* 11.1*  HCT 32.4* 33.3*  MCV 99.7 97.7  PLT 210 243    CMP      Component Value Date/Time   NA 134* 09/20/2014 1743   NA 137 09/19/2014 1113   K 3.9 09/20/2014 1743   K 4.0 09/19/2014 1113   CL 99 09/20/2014 1743   CL 103 11/25/2012 1328   CO2 28 09/20/2014 1743   CO2 26 09/19/2014 1113   GLUCOSE 91 09/20/2014 1743   GLUCOSE 88 09/19/2014 1113   GLUCOSE 91 11/25/2012 1328   BUN 16 09/20/2014 1743   BUN 15.2 09/19/2014 1113   CREATININE 0.86 09/20/2014 1743   CREATININE 0.7 09/19/2014 1113   CALCIUM 8.5 09/20/2014 1743   CALCIUM 8.3* 09/19/2014 1113   PROT 7.0 09/20/2014 1743   PROT 6.4 09/19/2014 1113   ALBUMIN 3.4* 09/20/2014 1743   ALBUMIN  3.2* 09/19/2014 1113   AST 28 09/20/2014 1743   AST 21 09/19/2014 1113   ALT 21 09/20/2014 1743   ALT 21 09/19/2014 1113   ALKPHOS 56 09/20/2014 1743   ALKPHOS 53 09/19/2014 1113   BILITOT 0.5 09/20/2014 1743   BILITOT 0.52 09/19/2014 1113   GFRNONAA 59* 09/20/2014 1743   GFRAA 69* 09/20/2014 1743    No results for input(s): LIPASE, AMYLASE in the last 168 hours.   Recent Labs Lab 09/21/14 0417  TROPONINI <0.03   BNP (last 3 results) No results for input(s): BNP in the last 8760 hours.  ProBNP (last 3 results) No results for input(s): PROBNP in the last 8760 hours.   Radiological Exams on Admission: Dg Chest 2 View  09/20/2014   CLINICAL DATA:  Acute onset chest pain radiating to right arm and jaw. Multiple myeloma.  EXAM: CHEST  2 VIEW  COMPARISON:  08/01/2014  FINDINGS: Mild cardiomegaly stable. Pulmonary hyperinflation again seen, consistent with COPD. Pleural-parenchymal scarring at the right lung base is unchanged. No evidence of acute infiltrate or edema. No evidence of pleural effusion. No definite mass or lymphadenopathy identified  Right-sided Port-A-Cath remains in appropriate position. Old lower thoracic vertebral body compression fracture and vertebroplasty again noted.  IMPRESSION: Stable cardiomegaly and COPD.  No active disease.   Electronically Signed   By: Earle Gell M.D.    On: 09/20/2014 18:49    EKG: Independently reviewed. normal sinus rhythm, LBBB.  Assessment/Plan Principal Problem:   Chest pain Active Problems:   Multiple myeloma   Neuropathy   DCIS (ductal carcinoma in situ)   Vertebral compression fracture   1. Chest pain The patient is presenting with malaise of chest pain. Initial troponin is negative EKG does not show any new abnormality. We'll get an echo program in the morning serial troponin. She is not hypoxic or tachycardic to suggest any possibility of PE. Does not have any leg swelling. Continue monitoring on telemetry. She will remain nothing by mouth after midnight.  2. Multiple myeloma. Currently on Revlimid. Would hold it at present.  3. Multiple vertebral compression fractures. Continue close monitoring. Continue home medication.  4. Neuropathy. Continue gabapentin.  5. Essential hypertension. Continue ARB.  Advance goals of care discussion: Full code   Consults: Cardiology  DVT Prophylaxis: subcutaneous Heparin. Nutrition: Nothing by mouth  Family Communication: Family was present at bedside, opportunity was given to ask question and all questions were answered satisfactorily at the time of interview. Disposition: Admitted to observationin telemetry unit.  Author: Berle Mull, MD [facility] Pager: [phone] 09/21/2014,     If 7PM-7AM, please contact night-coverage [email or web address] Password [email or web address]

## 2014-09-21 NOTE — ED Notes (Signed)
MD Dr. Posey Pronto at bedside.

## 2014-09-24 ENCOUNTER — Telehealth: Payer: Self-pay | Admitting: *Deleted

## 2014-09-24 NOTE — Telephone Encounter (Signed)
[name] called to say she had gone to ED with severe right sided CP. MD determined it was reflux, put her on protonix 40mg  daily. [name] wants to make sure Dr Alvy Bimler is OK with her taking this.

## 2014-09-25 ENCOUNTER — Telehealth: Payer: Self-pay | Admitting: *Deleted

## 2014-09-25 NOTE — Telephone Encounter (Signed)
Notified Mrs Ritchie that Dr Alvy Bimler states it is fine to take protonix as prescribed in the ED

## 2014-09-27 ENCOUNTER — Telehealth: Payer: Self-pay | Admitting: *Deleted

## 2014-09-27 NOTE — Telephone Encounter (Signed)
Pt left VM asking about results of her M-spike.  Called pt back and informed M spike result 0.95.  She verbalized understanding.

## 2014-10-04 ENCOUNTER — Ambulatory Visit: Payer: BLUE CROSS/BLUE SHIELD | Admitting: Hematology and Oncology

## 2014-10-11 ENCOUNTER — Telehealth: Payer: Self-pay | Admitting: *Deleted

## 2014-10-11 NOTE — Telephone Encounter (Signed)
Received request for dexamthasone refill.  No refill per Dr. Alvy Bimler as pt is now on low dose prednisone.

## 2014-10-12 ENCOUNTER — Other Ambulatory Visit: Payer: Self-pay | Admitting: Oncology

## 2014-10-15 ENCOUNTER — Telehealth: Payer: Self-pay

## 2014-10-15 NOTE — Telephone Encounter (Signed)
revlimid refill request given to RN of dr Alvy Bimler

## 2014-10-16 ENCOUNTER — Other Ambulatory Visit: Payer: Self-pay | Admitting: *Deleted

## 2014-10-16 MED ORDER — LENALIDOMIDE 10 MG PO CAPS: 10.0000 mg | ORAL_CAPSULE | Freq: Every day | ORAL | Status: DC

## 2014-10-18 ENCOUNTER — Ambulatory Visit (HOSPITAL_BASED_OUTPATIENT_CLINIC_OR_DEPARTMENT_OTHER): Payer: Medicare Other

## 2014-10-18 ENCOUNTER — Ambulatory Visit: Payer: Medicare Other

## 2014-10-18 ENCOUNTER — Other Ambulatory Visit (HOSPITAL_BASED_OUTPATIENT_CLINIC_OR_DEPARTMENT_OTHER): Payer: Medicare Other

## 2014-10-18 ENCOUNTER — Encounter: Payer: Self-pay | Admitting: Hematology and Oncology

## 2014-10-18 ENCOUNTER — Other Ambulatory Visit: Payer: Self-pay | Admitting: Hematology and Oncology

## 2014-10-18 ENCOUNTER — Telehealth: Payer: Self-pay | Admitting: Hematology and Oncology

## 2014-10-18 ENCOUNTER — Ambulatory Visit (HOSPITAL_BASED_OUTPATIENT_CLINIC_OR_DEPARTMENT_OTHER): Payer: Medicare Other | Admitting: Hematology and Oncology

## 2014-10-18 VITALS — BP 117/60 | HR 78 | Temp 97.6°F | Resp 18 | Ht 65.0 in | Wt 122.7 lb

## 2014-10-18 DIAGNOSIS — E038 Other specified hypothyroidism: Secondary | ICD-10-CM

## 2014-10-18 DIAGNOSIS — C9000 Multiple myeloma not having achieved remission: Secondary | ICD-10-CM

## 2014-10-18 DIAGNOSIS — D72819 Decreased white blood cell count, unspecified: Secondary | ICD-10-CM | POA: Diagnosis not present

## 2014-10-18 DIAGNOSIS — D630 Anemia in neoplastic disease: Secondary | ICD-10-CM | POA: Diagnosis not present

## 2014-10-18 DIAGNOSIS — M4850XS Collapsed vertebra, not elsewhere classified, site unspecified, sequela of fracture: Secondary | ICD-10-CM

## 2014-10-18 DIAGNOSIS — E039 Hypothyroidism, unspecified: Secondary | ICD-10-CM | POA: Diagnosis not present

## 2014-10-18 DIAGNOSIS — Z95828 Presence of other vascular implants and grafts: Secondary | ICD-10-CM

## 2014-10-18 DIAGNOSIS — D701 Agranulocytosis secondary to cancer chemotherapy: Secondary | ICD-10-CM

## 2014-10-18 DIAGNOSIS — M129 Arthropathy, unspecified: Secondary | ICD-10-CM

## 2014-10-18 DIAGNOSIS — IMO0001 Reserved for inherently not codable concepts without codable children: Secondary | ICD-10-CM

## 2014-10-18 DIAGNOSIS — T451X5A Adverse effect of antineoplastic and immunosuppressive drugs, initial encounter: Secondary | ICD-10-CM

## 2014-10-18 HISTORY — DX: Hypothyroidism, unspecified: E03.9

## 2014-10-18 LAB — CBC WITH DIFFERENTIAL/PLATELET
BASO%: 1 % (ref 0.0–2.0)
BASOS ABS: 0 10*3/uL (ref 0.0–0.1)
EOS ABS: 0 10*3/uL (ref 0.0–0.5)
EOS%: 1 % (ref 0.0–7.0)
HEMATOCRIT: 31.6 % — AB (ref 34.8–46.6)
HEMOGLOBIN: 10.5 g/dL — AB (ref 11.6–15.9)
LYMPH%: 42.3 % (ref 14.0–49.7)
MCH: 32.6 pg (ref 25.1–34.0)
MCHC: 33.2 g/dL (ref 31.5–36.0)
MCV: 98.1 fL (ref 79.5–101.0)
MONO#: 0.4 10*3/uL (ref 0.1–0.9)
MONO%: 13.8 % (ref 0.0–14.0)
NEUT#: 1.3 10*3/uL — ABNORMAL LOW (ref 1.5–6.5)
NEUT%: 41.9 % (ref 38.4–76.8)
NRBC: 0 % (ref 0–0)
Platelets: 216 10*3/uL (ref 145–400)
RBC: 3.22 10*6/uL — AB (ref 3.70–5.45)
RDW: 16 % — ABNORMAL HIGH (ref 11.2–14.5)
WBC: 3 10*3/uL — ABNORMAL LOW (ref 3.9–10.3)
lymph#: 1.3 10*3/uL (ref 0.9–3.3)

## 2014-10-18 LAB — COMPREHENSIVE METABOLIC PANEL (CC13)
ALBUMIN: 3.4 g/dL — AB (ref 3.5–5.0)
ALT: 22 U/L (ref 0–55)
ANION GAP: 9 meq/L (ref 3–11)
AST: 25 U/L (ref 5–34)
Alkaline Phosphatase: 57 U/L (ref 40–150)
BUN: 13 mg/dL (ref 7.0–26.0)
CALCIUM: 8.2 mg/dL — AB (ref 8.4–10.4)
CHLORIDE: 105 meq/L (ref 98–109)
CO2: 25 meq/L (ref 22–29)
Creatinine: 0.7 mg/dL (ref 0.6–1.1)
EGFR: 77 mL/min/{1.73_m2} — ABNORMAL LOW (ref >90)
Glucose: 109 mg/dl (ref 70–140)
POTASSIUM: 4.3 meq/L (ref 3.5–5.1)
SODIUM: 138 meq/L (ref 136–145)
Total Bilirubin: 0.36 mg/dL (ref 0.20–1.20)
Total Protein: 6.3 g/dL — ABNORMAL LOW (ref 6.4–8.3)

## 2014-10-18 LAB — T4, FREE: Free T4: 1.27 ng/dL (ref 0.80–1.80)

## 2014-10-18 LAB — TSH CHCC: TSH: 2.27 m(IU)/L (ref 0.308–3.960)

## 2014-10-18 MED ORDER — SODIUM CHLORIDE 0.9 % IJ SOLN
10.0000 mL | INTRAMUSCULAR | Status: DC | PRN
  Administered 2014-10-18: 10 mL via INTRAVENOUS
  Filled 2014-10-18: qty 10

## 2014-10-18 MED ORDER — HEPARIN SOD (PORK) LOCK FLUSH 100 UNIT/ML IV SOLN
500.0000 [IU] | Freq: Once | INTRAVENOUS | Status: DC
  Filled 2014-10-18: qty 5

## 2014-10-18 MED ORDER — DEXAMETHASONE 4 MG PO TABS: ORAL_TABLET | ORAL | Status: DC

## 2014-10-18 MED ORDER — SODIUM CHLORIDE 0.9 % IJ SOLN
10.0000 mL | INTRAMUSCULAR | Status: DC | PRN
  Administered 2014-10-18: 10 mL
  Filled 2014-10-18: qty 10

## 2014-10-18 MED ORDER — TRAMADOL HCL 50 MG PO TABS: 50.0000 mg | ORAL_TABLET | Freq: Four times a day (QID) | ORAL | Status: DC | PRN

## 2014-10-18 MED ORDER — ZOLEDRONIC ACID 4 MG/5ML IV CONC
3.3000 mg | Freq: Once | INTRAVENOUS | Status: AC
  Administered 2014-10-18: 3.3 mg via INTRAVENOUS
  Filled 2014-10-18: qty 4.13

## 2014-10-18 MED ORDER — HEPARIN SOD (PORK) LOCK FLUSH 100 UNIT/ML IV SOLN
500.0000 [IU] | Freq: Once | INTRAVENOUS | Status: AC | PRN
  Administered 2014-10-18: 500 [IU]
  Filled 2014-10-18: qty 5

## 2014-10-18 MED ORDER — SODIUM CHLORIDE 0.9 % IV SOLN
Freq: Once | INTRAVENOUS | Status: AC
Start: 2014-10-18 — End: 2014-10-18
  Administered 2014-10-18: 15:00:00 via INTRAVENOUS

## 2014-10-18 NOTE — Patient Instructions (Signed)

## 2014-10-18 NOTE — Patient Instructions (Signed)

## 2014-10-18 NOTE — Telephone Encounter (Signed)
gave and printed appt sched and avs fo rpt for May °

## 2014-10-18 NOTE — Progress Notes (Signed)
[place] OFFICE PROGRESS NOTE  Patient Care Team: Thressa Sheller, MD as PCP - General (Internal Medicine) Thressa Sheller, MD (Internal Medicine) Renella Cunas, MD as Attending Physician (Cardiology) Heath Lark, MD as Consulting Physician (Hematology and Oncology) Luanne Bras, MD as Consulting Physician (Interventional Radiology)  SUMMARY OF ONCOLOGIC HISTORY: Oncology History   The     Multiple myeloma   11/12/2008 Imaging Skeletal survey showed lytic lesion in the right femur compatible with  myeloma. There were Questionable skull lesions   11/15/2008 Bone Marrow Biopsy BONE MARROW ASPIRATE AND BIOPSY: showed NORMOCELLULAR MARROW FOR AGE WITH PLASMA CELL DYSCRASIA  (PLASMA CELLS 25%). 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 Subsequently she was placed on Revlimid & Dexamethasone alone   05/12/2013 Bone Marrow Biopsy Bone marrow biopsy showed persistent plasma cells. Blood work confirmed VGPR status   06/11/2014 Tumor Marker Bloodwork show disease relapse   06/26/2014 Procedure She has placement of port   07/05/2014 - 07/26/2014 Chemotherapy She received Elotuzumab and Revlimid. Rx was discontinued with elotuzumab per patient preference   08/01/2014 - 08/05/2014 Hospital Admission She was admitted to the hospital for kyphoplasty and pain management after traumatic compression fracture    INTERVAL HISTORY: Please see below for problem oriented charting.  she wishes for further follow-up. She has successfully taper off the prednisone 2 weeks ago but then started to become jittery. She has lost some appetite and is not feeling good. Denies nausea or vomiting. Denies worsening bone pain.  REVIEW OF SYSTEMS:   Constitutional: Denies fevers, chills or abnormal weight loss Eyes: Denies blurriness of vision Ears, nose,  mouth, throat, and face: Denies mucositis or sore throat Respiratory: Denies cough, dyspnea or wheezes Cardiovascular: Denies palpitation, chest discomfort or lower extremity swelling Gastrointestinal:  Denies nausea, heartburn or change in bowel habits Skin: Denies abnormal skin rashes Lymphatics: Denies new lymphadenopathy or easy bruising Neurological:Denies numbness, tingling or new weaknesses Behavioral/Psych: Mood is stable, no new changes  All other systems were reviewed with the patient and are negative.  I have reviewed the past medical history, past surgical history, social history and family history with the patient and they are unchanged from previous note.  ALLERGIES:  has No Known Allergies.  MEDICATIONS:  Current Outpatient Prescriptions  Medication Sig Dispense Refill  . acetaminophen (TYLENOL) 325 MG tablet Take 650 mg by mouth every 6 (six) hours as needed for mild pain.     [name] aspirin 325 MG EC tablet Take 325 mg by mouth daily.    . cholecalciferol (VITAMIN D) 1000 UNITS tablet Take 1,000 Units by mouth daily.    . diphenhydramine-acetaminophen (TYLENOL PM) 25-500 MG TABS Take 1 tablet by mouth at bedtime as needed (Sleep).     . gabapentin (NEURONTIN) 300 MG capsule Take 1 capsule (300 mg total) by mouth 3 (three) times daily. (Patient taking differently: Take 300 mg by mouth at bedtime. ) 90 capsule 3  . lenalidomide (REVLIMID) 10 MG capsule Take 1 capsule (10 mg total) by mouth daily. 21 capsule 0  . levothyroxine (SYNTHROID, LEVOTHROID) 50 MCG tablet Take 50 mcg by mouth daily before breakfast.    . lidocaine-prilocaine (EMLA) cream Apply 1 application topically as needed. Apply to [facility] a cath site at least one hour prior to [employer]. 30 g 2  . loperamide (IMODIUM A-D) 2 MG tablet Take 2 mg by mouth as  needed for diarrhea or loose stools (as directed.).    [name] Multiple Vitamin (MULTIVITAMIN WITH MINERALS) TABS tablet Take 1 tablet by mouth daily.    . Polyvinyl  Alcohol-Povidone (REFRESH OP) Place 2 drops into both eyes 2 (two) times daily as needed (dry eyes).    [name] PRESCRIPTION MEDICATION Chemo at [facility]    . traMADol (ULTRAM) 50 MG tablet Take 1 tablet (50 mg total) by mouth every 6 (six) hours as needed for moderate pain. 60 tablet 0  . valsartan (DIOVAN) 160 MG tablet Take 1 tablet by mouth daily. Needs office visit. 30 tablet 0  . dexamethasone (DECADRON) 4 MG tablet Take 1 tablet every Monday and Thursday 60 tablet 0   No current facility-administered medications for this visit.   Facility-Administered Medications Ordered in Other Visits  Medication Dose Route Frequency Provider Last Rate Last Dose  . 0.9 %  sodium chloride infusion   Intravenous Once Heath Lark, MD      . heparin lock flush 100 unit/mL  500 Units Intracatheter Once PRN Heath Lark, MD      . sodium chloride 0.9 % injection 10 mL  10 mL Intracatheter PRN Heath Lark, MD      . zolendronic acid (ZOMETA) 4 mg in sodium chloride 0.9 % 100 mL IVPB  4 mg Intravenous Once Heath Lark, MD        PHYSICAL EXAMINATION: ECOG PERFORMANCE STATUS: 1 - Symptomatic but completely ambulatory  Filed Vitals:   10/18/14 1405  BP: 117/60  Pulse: 78  Temp: 97.6 F (36.4 C)  Resp: 18   Filed Weights   10/18/14 1405  Weight: 122 lb 11.2 oz (55.656 kg)    GENERAL:alert, no distress and comfortable SKIN: skin color, texture, turgor are normal, no rashes or significant lesions EYES: normal, Conjunctiva are pink and non-injected, sclera clear OROPHARYNX:no exudate, no erythema and lips, buccal mucosa, and tongue normal  NECK: supple, thyroid normal size, non-tender, without nodularity LYMPH:  no palpable lymphadenopathy in the cervical, axillary or inguinal LUNGS: clear to auscultation and percussion with normal breathing effort HEART: regular rate & rhythm and no murmurs and no lower extremity edema ABDOMEN:abdomen soft, non-tender and normal bowel sounds Musculoskeletal:no cyanosis of  digits and no clubbing  NEURO: alert & oriented x 3 with fluent speech, no focal motor/sensory deficits. She appears anxious and tremulous  LABORATORY DATA:  I have reviewed the data as listed    Component Value Date/Time   NA 138 10/18/2014 1345   NA 138 09/21/2014 0725   K 4.3 10/18/2014 1345   K 4.1 09/21/2014 0725   CL 103 09/21/2014 0725   CL 103 11/25/2012 1328   CO2 25 10/18/2014 1345   CO2 30 09/21/2014 0725   GLUCOSE 109 10/18/2014 1345   GLUCOSE 76 09/21/2014 0725   GLUCOSE 91 11/25/2012 1328   BUN 13.0 10/18/2014 1345   BUN 11 09/21/2014 0725   CREATININE 0.7 10/18/2014 1345   CREATININE 0.77 09/21/2014 0725   CALCIUM 8.2* 10/18/2014 1345   CALCIUM 8.3* 09/21/2014 0725   PROT 6.3* 10/18/2014 1345   PROT 5.7* 09/21/2014 0725   ALBUMIN 3.4* 10/18/2014 1345   ALBUMIN 3.0* 09/21/2014 0725   AST 25 10/18/2014 1345   AST 22 09/21/2014 0725   ALT 22 10/18/2014 1345   ALT 17 09/21/2014 0725   ALKPHOS 57 10/18/2014 1345   ALKPHOS 43 09/21/2014 0725   BILITOT 0.36 10/18/2014 1345   BILITOT 0.7 09/21/2014 0725   GFRNONAA 74*  09/21/2014 0725   GFRAA 86* 09/21/2014 0725    No results found for: SPEP, UPEP  Lab Results  Component Value Date   WBC 3.0* 10/18/2014   NEUTROABS 1.3* 10/18/2014   HGB 10.5* 10/18/2014   HCT 31.6* 10/18/2014   MCV 98.1 10/18/2014   PLT 216 10/18/2014      Chemistry      Component Value Date/Time   NA 138 10/18/2014 1345   NA 138 09/21/2014 0725   K 4.3 10/18/2014 1345   K 4.1 09/21/2014 0725   CL 103 09/21/2014 0725   CL 103 11/25/2012 1328   CO2 25 10/18/2014 1345   CO2 30 09/21/2014 0725   BUN 13.0 10/18/2014 1345   BUN 11 09/21/2014 0725   CREATININE 0.7 10/18/2014 1345   CREATININE 0.77 09/21/2014 0725      Component Value Date/Time   CALCIUM 8.2* 10/18/2014 1345   CALCIUM 8.3* 09/21/2014 0725   ALKPHOS 57 10/18/2014 1345   ALKPHOS 43 09/21/2014 0725   AST 25 10/18/2014 1345   AST 22 09/21/2014 0725   ALT 22  10/18/2014 1345   ALT 17 09/21/2014 0725   BILITOT 0.36 10/18/2014 1345   BILITOT 0.7 09/21/2014 0725     ASSESSMENT & PLAN:  Multiple myeloma The patient had significant complication related to recent traumatic compression fracture. She will continue on Revlimid for now. She will continue Zometa every 3 months. Her overall symptoms has deteriorated since recent prednisone taper.  I recommend resumption of steroid in the form of dexamethasone 4 mg on Mondays and Thursdays. I will see her back in a month. She feels better, I will start steroid taper again.   Anemia in neoplastic disease This is likely anemia of chronic disease. The patient denies recent history of bleeding such as epistaxis, hematuria or hematochezia. She is asymptomatic from the anemia. We will observe for now.    Leukopenia due to antineoplastic chemotherapy This is likely due to recent treatment. The patient denies recent history of fevers, cough, chills, diarrhea or dysuria. She is asymptomatic from the leukopenia. I will observe for now.  I will continue the chemotherapy at current dose without dosage adjustment.  If the leukopenia gets progressive worse in the future, I might have to delay her treatment or adjust the chemotherapy dose.     Vertebral compression fracture She had severe pain but was unable to tolerate narcotics due to severe constipation. She is doing well with tramadol. I refilled her prescription today.     Hypothyroidism  She complained of feeling jittery. Her last TSH 3 months ago were within normal limits. I will recheck her thought function test today.    Orders Placed This Encounter  Procedures  . TSH    Standing Status: Future     Number of Occurrences:      Standing Expiration Date: 11/22/2015  . T4, free    Standing Status: Future     Number of Occurrences:      Standing Expiration Date: 11/22/2015   All questions were answered. The patient knows to call the clinic with  any problems, questions or concerns. No barriers to learning was detected. I spent 25 minutes counseling the patient face to face. The total time spent in the appointment was 30 minutes and more than 50% was on counseling and review of test results     [facility], [place], MD 10/18/2014 2:35 PM

## 2014-10-18 NOTE — Assessment & Plan Note (Signed)
The patient had significant complication related to recent traumatic compression fracture. She will continue on Revlimid for now. She will continue Zometa every 3 months. Her overall symptoms has deteriorated since recent prednisone taper.  I recommend resumption of steroid in the form of dexamethasone 4 mg on Mondays and Thursdays. I will see her back in a month. She feels better, I will start steroid taper again.

## 2014-10-18 NOTE — Assessment & Plan Note (Signed)
She complained of feeling jittery. Her last TSH 3 months ago were within normal limits. I will recheck her thought function test today.

## 2014-10-18 NOTE — Assessment & Plan Note (Signed)
She had severe pain but was unable to tolerate narcotics due to severe constipation. She is doing well with tramadol. I refilled her prescription today.

## 2014-10-18 NOTE — Assessment & Plan Note (Signed)
This is likely due to recent treatment. The patient denies recent history of fevers, cough, chills, diarrhea or dysuria. She is asymptomatic from the leukopenia. I will observe for now.  I will continue the chemotherapy at current dose without dosage adjustment.  If the leukopenia gets progressive worse in the future, I might have to delay her treatment or adjust the chemotherapy dose.   

## 2014-10-18 NOTE — Assessment & Plan Note (Signed)
This is likely anemia of chronic disease. The patient denies recent history of bleeding such as epistaxis, hematuria or hematochezia. She is asymptomatic from the anemia. We will observe for now.  

## 2014-10-22 LAB — SPEP & IFE WITH QIG
ABNORMAL PROTEIN BAND1: 0.8 g/dL
Albumin ELP: 3.6 g/dL — ABNORMAL LOW (ref 3.8–4.8)
Alpha-1-Globulin: 0.2 g/dL (ref 0.2–0.3)
Alpha-2-Globulin: 0.7 g/dL (ref 0.5–0.9)
BETA 2: 0.2 g/dL (ref 0.2–0.5)
Beta Globulin: 0.4 g/dL (ref 0.4–0.6)
Gamma Globulin: 1.1 g/dL (ref 0.8–1.7)
IGA: 40 mg/dL — AB (ref 69–380)
IGG (IMMUNOGLOBIN G), SERUM: 1220 mg/dL (ref 690–1700)
IGM, SERUM: 79 mg/dL (ref 52–322)
Total Protein, Serum Electrophoresis: 6.2 g/dL (ref 6.1–8.1)

## 2014-10-22 LAB — KAPPA/LAMBDA LIGHT CHAINS
KAPPA FREE LGHT CHN: 4.56 mg/dL — AB (ref 0.33–1.94)
Kappa:Lambda Ratio: 3.35 — ABNORMAL HIGH (ref 0.26–1.65)
LAMBDA FREE LGHT CHN: 1.36 mg/dL (ref 0.57–2.63)

## 2014-10-24 ENCOUNTER — Encounter: Payer: Self-pay | Admitting: Cardiovascular Disease

## 2014-10-24 ENCOUNTER — Telehealth: Payer: Self-pay | Admitting: *Deleted

## 2014-10-24 ENCOUNTER — Ambulatory Visit (INDEPENDENT_AMBULATORY_CARE_PROVIDER_SITE_OTHER): Payer: Medicare Other | Admitting: Cardiovascular Disease

## 2014-10-24 VITALS — BP 142/70 | HR 70 | Ht 65.0 in | Wt 121.8 lb

## 2014-10-24 DIAGNOSIS — R0782 Intercostal pain: Secondary | ICD-10-CM

## 2014-10-24 DIAGNOSIS — I359 Nonrheumatic aortic valve disorder, unspecified: Secondary | ICD-10-CM | POA: Diagnosis not present

## 2014-10-24 DIAGNOSIS — I447 Left bundle-branch block, unspecified: Secondary | ICD-10-CM

## 2014-10-24 DIAGNOSIS — C9000 Multiple myeloma not having achieved remission: Secondary | ICD-10-CM

## 2014-10-24 DIAGNOSIS — I10 Essential (primary) hypertension: Secondary | ICD-10-CM | POA: Diagnosis not present

## 2014-10-24 DIAGNOSIS — IMO0001 Reserved for inherently not codable concepts without codable children: Secondary | ICD-10-CM

## 2014-10-24 DIAGNOSIS — M4850XD Collapsed vertebra, not elsewhere classified, site unspecified, subsequent encounter for fracture with routine healing: Secondary | ICD-10-CM | POA: Diagnosis not present

## 2014-10-24 NOTE — Assessment & Plan Note (Signed)
Mild by last echo not a surgical candidate murmur quite on exam  Continue afterload reduction with ARB  STable

## 2014-10-24 NOTE — Patient Instructions (Signed)
Medication Instructions:  NO CHANGES  Labwork: NONE  Testing/Procedures: NONE  Follow-Up: Your physician recommends that you schedule a follow-up appointment in:  3 MONTHS WITH DR NISHAN  Any Other Special Instructions Will Be Listed Below (If Applicable).   

## 2014-10-24 NOTE — Assessment & Plan Note (Signed)
Atypical more related to bone pain and myeloma  Myovue 3/`6/16 normal no ischemia Observe

## 2014-10-24 NOTE — Telephone Encounter (Signed)
She is already prescribed dex on Mondays and Thursdays If she wants to restart prednisone, she can, but to stop her dex

## 2014-10-24 NOTE — Assessment & Plan Note (Signed)
Still with pain Likely pathologic fracture from myeloma  Consider referral to pain clinic  F/U IR  She would like to avoid narcotics

## 2014-10-24 NOTE — Assessment & Plan Note (Signed)
She wants to cut back on her ARB I told her that was fine  Gets BP checked montly at cancer center will go back to 160mg  dose if BP goes too hgh

## 2014-10-24 NOTE — Assessment & Plan Note (Signed)
Chronic no high grade AV block or syncope Yearly ECG

## 2014-10-24 NOTE — Assessment & Plan Note (Signed)
F/U Oncology resume remicade  And follow blood counts SPEP/UPEP

## 2014-10-24 NOTE — Progress Notes (Signed)
Patient ID: Natasha Jackson, female   DOB: [birth year], 81 y.o.   MRN: [id number] HPI: pleasant female followed by Dr Stanford Breed  On  09/22/12 seen for evaluation of bradycardia; Has h/o aortic insufficiency and hypertension. Last echocardiogram in July of 2013 showed an ejection fraction of 45-50% and moderate aortic insufficiency. There was mild mitral regurgitation and mild left atrial enlargement. Patient denies dyspnea on exertion, orthopnea, PND, pedal edema, palpitations or syncope. Yesterday she turned her head and had transient dizziness for 10 seconds not associated with chest pain, palpitations and no syncope. Currently being Rx for myeloma with revlimid and decadron. Bothered most by neuropathy in feet   F/U event monitor showed SR with PAC;s and PVC;s mean HR 62 with min rate early in am 46 with sinus bradycaria no AV block  Primary issues are arthritis in back and hands and hot flashes  Just had ruptured disc that is still painful.  Gets nausea with tramadol T12 lesion likley from myeloma  Seen in hospital by Dr Mare Ferrari 09/19/14  For atypical chest pain after her fracture  Myovue normal reviewed EF 64%    09/21/14  myovue normal EF 64%  10/13/13  Echo mild AR Ef 40-45%  Study Conclusions  - Left ventricle: The cavity size was normal. Systolic function was mildly to moderately reduced. The estimated ejection fraction was in the range of 40% to 45%. Diffuse hypokinesis. There was an increased relative contribution of atrial contraction to ventricular filling. Doppler parameters are consistent with abnormal left ventricular relaxation (grade 1 diastolic dysfunction). - Ventricular septum: Septal motion showed paradox. - Aortic valve: Mild regurgitation. - Mitral valve: Mild to moderate regurgitation. - Atrial septum: There was increased thickness of the  ROS: Denies fever, malais, weight loss, blurry vision, decreased visual acuity, cough, sputum, SOB, hemoptysis, pleuritic  pain, palpitaitons, heartburn, abdominal pain, melena, lower extremity edema, claudication, or rash.  All other systems reviewed and negative  General: Affect appropriate Healthy:  appears stated age 38: normal Neck supple with no adenopathy JVP normal no bruits no thyromegaly Lungs clear with no wheezing and good diaphragmatic motion Heart:  S1/S2 AS/AR  murmur, no rub, gallop or click PMI normal Abdomen: benighn, BS positve, no tenderness, no AAA no bruit.  No HSM or HJR Distal pulses intact with no bruits No edema Neuro non-focal Skin warm and dry No muscular weakness   Current Outpatient Prescriptions  Medication Sig Dispense Refill  . acetaminophen (TYLENOL) 325 MG tablet Take 650 mg by mouth every 6 (six) hours as needed for mild pain.     [name] aspirin 325 MG EC tablet Take 325 mg by mouth daily.    . cholecalciferol (VITAMIN D) 1000 UNITS tablet Take 1,000 Units by mouth daily.    [name] dexamethasone (DECADRON) 4 MG tablet Take 1 tablet every Monday and Thursday 60 tablet 0  . diphenhydramine-acetaminophen (TYLENOL PM) 25-500 MG TABS Take 1 tablet by mouth at bedtime as needed (Sleep).     . gabapentin (NEURONTIN) 300 MG capsule Take 1 capsule (300 mg total) by mouth 3 (three) times daily. (Patient taking differently: Take 300 mg by mouth at bedtime. ) 90 capsule 3  . lenalidomide (REVLIMID) 10 MG capsule Take 1 capsule (10 mg total) by mouth daily. 21 capsule 0  . levothyroxine (SYNTHROID, LEVOTHROID) 50 MCG tablet Take 50 mcg by mouth daily before breakfast.    . lidocaine-prilocaine (EMLA) cream Apply 1 application topically as needed. Apply to [facility] a cath site at least  one hour prior to [place]. 30 g 2  . loperamide (IMODIUM A-D) 2 MG tablet Take 2 mg by mouth as needed for diarrhea or loose stools (as directed.).    [name] Multiple Vitamin (MULTIVITAMIN WITH MINERALS) TABS tablet Take 1 tablet by mouth daily.    . Polyvinyl Alcohol-Povidone (REFRESH OP) Place 2 drops into  both eyes 2 (two) times daily as needed (dry eyes).    [name] PRESCRIPTION MEDICATION Chemo at [facility]    . traMADol (ULTRAM) 50 MG tablet Take 1 tablet (50 mg total) by mouth every 6 (six) hours as needed for moderate pain. 60 tablet 0  . valsartan (DIOVAN) 160 MG tablet Take 1 tablet by mouth daily. Needs office visit. 30 tablet 0   No current facility-administered medications for this visit.    Allergies  Review of patient's allergies indicates no known allergies.  Electrocardiogram:   09/2014  SR 57 LVVV Limb lead reversal new since 3/14  LBBB old   Assessment and Plan

## 2014-10-24 NOTE — Telephone Encounter (Signed)
Pt left VM, states Dr. Alvy Bimler mentioned she should go back on prednisone but can't remember why?   She has not restarted it yet.  She has old rx for prednisone, asks if she can use those?

## 2014-10-24 NOTE — Telephone Encounter (Signed)
Left VM for pt informing Dr. Alvy Bimler instructs for pt to take Dexamethasone twice weekly,  4 mg on Mondays and Thursdays.  Pt can restart her prednisone if she wants but will need to stop her dexamethasone.  Asked her to please call back if any questions about this.

## 2014-11-05 ENCOUNTER — Other Ambulatory Visit: Payer: Self-pay | Admitting: Cardiovascular Disease

## 2014-11-05 ENCOUNTER — Other Ambulatory Visit: Payer: Self-pay | Admitting: Hematology and Oncology

## 2014-11-07 ENCOUNTER — Telehealth: Payer: Self-pay | Admitting: *Deleted

## 2014-11-07 NOTE — Telephone Encounter (Signed)
[facility] faxed Revlimid refill request.  Request to provider's desk/in-basket for review.

## 2014-11-08 ENCOUNTER — Other Ambulatory Visit: Payer: Self-pay | Admitting: *Deleted

## 2014-11-08 MED ORDER — LENALIDOMIDE 10 MG PO CAPS: 10.0000 mg | ORAL_CAPSULE | Freq: Every day | ORAL | Status: DC

## 2014-11-13 ENCOUNTER — Telehealth: Payer: Self-pay | Admitting: *Deleted

## 2014-11-13 NOTE — Telephone Encounter (Signed)
Pt called to see what else she can take for depression. Stopped Remeron because she thought Dr Alvy Bimler instructed her to stop at last OV. States she felt much better on the Remeron, no SE and ate better.

## 2014-11-14 ENCOUNTER — Telehealth: Payer: Self-pay | Admitting: *Deleted

## 2014-11-14 NOTE — Telephone Encounter (Signed)
There are many different kinds, each have different side effects profile Easiest would be to take Remeron since it can help with sleep, appetite and depression

## 2014-11-14 NOTE — Telephone Encounter (Signed)
Pt notified it is OK to restart remeron

## 2014-11-22 ENCOUNTER — Telehealth: Payer: Self-pay | Admitting: Hematology and Oncology

## 2014-11-22 ENCOUNTER — Ambulatory Visit (HOSPITAL_BASED_OUTPATIENT_CLINIC_OR_DEPARTMENT_OTHER): Payer: BLUE CROSS/BLUE SHIELD | Admitting: Hematology and Oncology

## 2014-11-22 ENCOUNTER — Other Ambulatory Visit (HOSPITAL_BASED_OUTPATIENT_CLINIC_OR_DEPARTMENT_OTHER): Payer: Medicare Other

## 2014-11-22 ENCOUNTER — Ambulatory Visit (HOSPITAL_COMMUNITY)
Admission: RE | Admit: 2014-11-22 | Discharge: 2014-11-22 | Disposition: A | Discharge status: Home / Self Care | Payer: Medicare Other | Source: Ambulatory Visit | Attending: Hematology and Oncology | Admitting: Hematology and Oncology

## 2014-11-22 ENCOUNTER — Ambulatory Visit (HOSPITAL_BASED_OUTPATIENT_CLINIC_OR_DEPARTMENT_OTHER): Payer: Medicare Other

## 2014-11-22 VITALS — BP 142/34 | HR 53 | Temp 97.8°F | Resp 17 | Ht 65.0 in | Wt 120.7 lb

## 2014-11-22 DIAGNOSIS — C9000 Multiple myeloma not having achieved remission: Secondary | ICD-10-CM

## 2014-11-22 DIAGNOSIS — T451X5A Adverse effect of antineoplastic and immunosuppressive drugs, initial encounter: Secondary | ICD-10-CM

## 2014-11-22 DIAGNOSIS — M4854XA Collapsed vertebra, not elsewhere classified, thoracic region, initial encounter for fracture: Secondary | ICD-10-CM | POA: Diagnosis not present

## 2014-11-22 DIAGNOSIS — M129 Arthropathy, unspecified: Secondary | ICD-10-CM

## 2014-11-22 DIAGNOSIS — M549 Dorsalgia, unspecified: Secondary | ICD-10-CM | POA: Diagnosis present

## 2014-11-22 DIAGNOSIS — M4850XS Collapsed vertebra, not elsewhere classified, site unspecified, sequela of fracture: Secondary | ICD-10-CM

## 2014-11-22 DIAGNOSIS — R0781 Pleurodynia: Secondary | ICD-10-CM | POA: Insufficient documentation

## 2014-11-22 DIAGNOSIS — G620 Drug-induced polyneuropathy: Secondary | ICD-10-CM

## 2014-11-22 DIAGNOSIS — M79605 Pain in left leg: Secondary | ICD-10-CM | POA: Insufficient documentation

## 2014-11-22 DIAGNOSIS — IMO0001 Reserved for inherently not codable concepts without codable children: Secondary | ICD-10-CM

## 2014-11-22 DIAGNOSIS — D630 Anemia in neoplastic disease: Secondary | ICD-10-CM | POA: Diagnosis not present

## 2014-11-22 DIAGNOSIS — D701 Agranulocytosis secondary to cancer chemotherapy: Secondary | ICD-10-CM | POA: Diagnosis not present

## 2014-11-22 DIAGNOSIS — F32A Depression, unspecified: Secondary | ICD-10-CM

## 2014-11-22 DIAGNOSIS — F329 Major depressive disorder, single episode, unspecified: Secondary | ICD-10-CM

## 2014-11-22 DIAGNOSIS — Z95828 Presence of other vascular implants and grafts: Secondary | ICD-10-CM

## 2014-11-22 DIAGNOSIS — R0789 Other chest pain: Secondary | ICD-10-CM

## 2014-11-22 LAB — CBC WITH DIFFERENTIAL/PLATELET
BASO%: 1.8 % (ref 0.0–2.0)
Basophils Absolute: 0.1 10*3/uL (ref 0.0–0.1)
EOS ABS: 0 10*3/uL (ref 0.0–0.5)
EOS%: 1.1 % (ref 0.0–7.0)
HCT: 32.8 % — ABNORMAL LOW (ref 34.8–46.6)
HEMOGLOBIN: 10.9 g/dL — AB (ref 11.6–15.9)
LYMPH%: 33.4 % (ref 14.0–49.7)
MCH: 32.8 pg (ref 25.1–34.0)
MCHC: 33.1 g/dL (ref 31.5–36.0)
MCV: 99.1 fL (ref 79.5–101.0)
MONO#: 0.4 10*3/uL (ref 0.1–0.9)
MONO%: 11.2 % (ref 0.0–14.0)
NEUT#: 1.7 10*3/uL (ref 1.5–6.5)
NEUT%: 52.5 % (ref 38.4–76.8)
PLATELETS: 219 10*3/uL (ref 145–400)
RBC: 3.31 10*6/uL — ABNORMAL LOW (ref 3.70–5.45)
RDW: 17 % — ABNORMAL HIGH (ref 11.2–14.5)
WBC: 3.3 10*3/uL — ABNORMAL LOW (ref 3.9–10.3)
lymph#: 1.1 10*3/uL (ref 0.9–3.3)

## 2014-11-22 LAB — COMPREHENSIVE METABOLIC PANEL (CC13)
ALBUMIN: 3.3 g/dL — AB (ref 3.5–5.0)
ALT: 21 U/L (ref 0–55)
ANION GAP: 10 meq/L (ref 3–11)
AST: 25 U/L (ref 5–34)
Alkaline Phosphatase: 61 U/L (ref 40–150)
BUN: 14.4 mg/dL (ref 7.0–26.0)
CO2: 23 meq/L (ref 22–29)
CREATININE: 0.7 mg/dL (ref 0.6–1.1)
Calcium: 8.1 mg/dL — ABNORMAL LOW (ref 8.4–10.4)
Chloride: 103 mEq/L (ref 98–109)
EGFR: 75 mL/min/{1.73_m2} — AB (ref >90)
GLUCOSE: 79 mg/dL (ref 70–140)
POTASSIUM: 4.4 meq/L (ref 3.5–5.1)
Sodium: 136 mEq/L (ref 136–145)
Total Bilirubin: 0.4 mg/dL (ref 0.20–1.20)
Total Protein: 6.2 g/dL — ABNORMAL LOW (ref 6.4–8.3)

## 2014-11-22 MED ORDER — TRAMADOL HCL 50 MG PO TABS: 50.0000 mg | ORAL_TABLET | Freq: Four times a day (QID) | ORAL | Status: DC | PRN

## 2014-11-22 MED ORDER — HEPARIN SOD (PORK) LOCK FLUSH 100 UNIT/ML IV SOLN
500.0000 [IU] | Freq: Once | INTRAVENOUS | Status: AC
  Administered 2014-11-22: 500 [IU] via INTRAVENOUS
  Filled 2014-11-22: qty 5

## 2014-11-22 MED ORDER — SODIUM CHLORIDE 0.9 % IJ SOLN
10.0000 mL | INTRAMUSCULAR | Status: DC | PRN
  Administered 2014-11-22: 10 mL via INTRAVENOUS
  Filled 2014-11-22: qty 10

## 2014-11-22 NOTE — Assessment & Plan Note (Signed)
This is likely due to recent treatment. The patient denies recent history of fevers, cough, chills, diarrhea or dysuria. She is asymptomatic from the leukopenia. I will observe for now.  I will continue the chemotherapy at current dose without dosage adjustment.  If the leukopenia gets progressive worse in the future, I might have to delay her treatment or adjust the chemotherapy dose.   

## 2014-11-22 NOTE — Patient Instructions (Signed)

## 2014-11-22 NOTE — Telephone Encounter (Signed)
Gave and printed appt sched and avs for July °

## 2014-11-22 NOTE — Progress Notes (Signed)
[place] OFFICE PROGRESS NOTE  Patient Care Team: Thressa Sheller, MD as PCP - General (Internal Medicine) Thressa Sheller, MD (Internal Medicine) Renella Cunas, MD as Attending Physician (Cardiology) Heath Lark, MD as Consulting Physician (Hematology and Oncology) Luanne Bras, MD as Consulting Physician (Interventional Radiology)  SUMMARY OF ONCOLOGIC HISTORY: Oncology History   The     Multiple myeloma   11/12/2008 Imaging Skeletal survey showed lytic lesion in the right femur compatible with  myeloma. There were Questionable skull lesions   11/15/2008 Bone Marrow Biopsy BONE MARROW ASPIRATE AND BIOPSY: showed NORMOCELLULAR MARROW FOR AGE WITH PLASMA CELL DYSCRASIA  (PLASMA CELLS 25%). 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 Subsequently she was placed on Revlimid & Dexamethasone alone   05/12/2013 Bone Marrow Biopsy Bone marrow biopsy showed persistent plasma cells. Blood work confirmed VGPR status   06/11/2014 Tumor Marker Bloodwork show disease relapse   06/26/2014 Procedure She has placement of port   07/05/2014 - 07/26/2014 Chemotherapy She received Elotuzumab and Revlimid. Rx was discontinued with elotuzumab per patient preference   08/01/2014 - 08/05/2014 Hospital Admission She was admitted to the hospital for kyphoplasty and pain management after traumatic compression fracture    INTERVAL HISTORY: Please see below for problem oriented charting. She returns for further follow-up. She complained of near left leg pain with associated mild neuropathy. She complained of severe left rib pain and she's been taking 3 tramadol per day. Her mood is stable. She tolerates Remeron well. She complained of peripheral neuropathy which is stable in her feet. She denies recent infection.  REVIEW OF SYSTEMS:    Constitutional: Denies fevers, chills or abnormal weight loss Eyes: Denies blurriness of vision Ears, nose, mouth, throat, and face: Denies mucositis or sore throat Respiratory: Denies cough, dyspnea or wheezes Cardiovascular: Denies palpitation, chest discomfort or lower extremity swelling Gastrointestinal:  Denies nausea, heartburn or change in bowel habits Skin: Denies abnormal skin rashes Lymphatics: Denies new lymphadenopathy or easy bruising Behavioral/Psych: Mood is stable, no new changes  All other systems were reviewed with the patient and are negative.  I have reviewed the past medical history, past surgical history, social history and family history with the patient and they are unchanged from previous note.  ALLERGIES:  has No Known Allergies.  MEDICATIONS:  Current Outpatient Prescriptions  Medication Sig Dispense Refill  . acetaminophen (TYLENOL) 325 MG tablet Take 650 mg by mouth every 6 (six) hours as needed for mild pain.     [name] aspirin 325 MG EC tablet Take 325 mg by mouth daily.    . cholecalciferol (VITAMIN D) 1000 UNITS tablet Take 1,000 Units by mouth daily.    [name] dexamethasone (DECADRON) 4 MG tablet Take 1 tablet every Monday and Thursday 60 tablet 0  . diphenhydramine-acetaminophen (TYLENOL PM) 25-500 MG TABS Take 1 tablet by mouth at bedtime as needed (Sleep).     . gabapentin (NEURONTIN) 300 MG capsule TAKE ONE CAPSULE BY MOUTH THREE TIMES DAILY 90 capsule 3  . lenalidomide (REVLIMID) 10 MG capsule Take 1 capsule (10 mg total) by mouth daily. 21 capsule 0  . levothyroxine (SYNTHROID, LEVOTHROID) 50 MCG tablet Take 50 mcg by mouth daily before breakfast.    . lidocaine-prilocaine (EMLA) cream Apply 1 application topically as needed. Apply to [facility] a cath site at least one hour prior to [employer]. 30 g 2  . loperamide (IMODIUM  A-D) 2 MG tablet Take 2 mg by mouth as needed for diarrhea or loose stools (as directed.).    [name] Multiple Vitamin (MULTIVITAMIN WITH  MINERALS) TABS tablet Take 1 tablet by mouth daily.    . Polyvinyl Alcohol-Povidone (REFRESH OP) Place 2 drops into both eyes 2 (two) times daily as needed (dry eyes).    [name] PRESCRIPTION MEDICATION Chemo at [facility]    . traMADol (ULTRAM) 50 MG tablet Take 1 tablet (50 mg total) by mouth every 6 (six) hours as needed for moderate pain. 90 tablet 0  . valsartan (DIOVAN) 160 MG tablet Take 1 tablet (160 mg total) by mouth daily. 30 tablet 3   No current facility-administered medications for this visit.    PHYSICAL EXAMINATION: ECOG PERFORMANCE STATUS: 1 - Symptomatic but completely ambulatory  Filed Vitals:   11/22/14 1322  BP: 142/34  Pulse:   Temp:   Resp:    Filed Weights   11/22/14 1321  Weight: 120 lb 11.2 oz (54.749 kg)    GENERAL:alert, no distress and comfortable SKIN: skin color, texture, turgor are normal, no rashes or significant lesions EYES: normal, Conjunctiva are pink and non-injected, sclera clear OROPHARYNX:no exudate, no erythema and lips, buccal mucosa, and tongue normal  NECK: supple, thyroid normal size, non-tender, without nodularity LYMPH:  no palpable lymphadenopathy in the cervical, axillary or inguinal LUNGS: clear to auscultation and percussion with normal breathing effort HEART: regular rate & rhythm and no murmurs and no lower extremity edema ABDOMEN:abdomen soft, non-tender and normal bowel sounds Musculoskeletal:no cyanosis of digits and no clubbing  NEURO: alert & oriented x 3 with fluent speech, no focal motor/sensory deficits  LABORATORY DATA:  I have reviewed the data as listed    Component Value Date/Time   NA 136 11/22/2014 1304   NA 138 09/21/2014 0725   K 4.4 11/22/2014 1304   K 4.1 09/21/2014 0725   CL 103 09/21/2014 0725   CL 103 11/25/2012 1328   CO2 23 11/22/2014 1304   CO2 30 09/21/2014 0725   GLUCOSE 79 11/22/2014 1304   GLUCOSE 76 09/21/2014 0725   GLUCOSE 91 11/25/2012 1328   BUN 14.4 11/22/2014 1304   BUN 11 09/21/2014 0725    CREATININE 0.7 11/22/2014 1304   CREATININE 0.77 09/21/2014 0725   CALCIUM 8.1* 11/22/2014 1304   CALCIUM 8.3* 09/21/2014 0725   PROT 6.2* 11/22/2014 1304   PROT 5.7* 09/21/2014 0725   ALBUMIN 3.3* 11/22/2014 1304   ALBUMIN 3.0* 09/21/2014 0725   AST 25 11/22/2014 1304   AST 22 09/21/2014 0725   ALT 21 11/22/2014 1304   ALT 17 09/21/2014 0725   ALKPHOS 61 11/22/2014 1304   ALKPHOS 43 09/21/2014 0725   BILITOT 0.40 11/22/2014 1304   BILITOT 0.7 09/21/2014 0725   GFRNONAA 74* 09/21/2014 0725   GFRAA 86* 09/21/2014 0725    No results found for: SPEP, UPEP  Lab Results  Component Value Date   WBC 3.3* 11/22/2014   NEUTROABS 1.7 11/22/2014   HGB 10.9* 11/22/2014   HCT 32.8* 11/22/2014   MCV 99.1 11/22/2014   PLT 219 11/22/2014      Chemistry      Component Value Date/Time   NA 136 11/22/2014 1304   NA 138 09/21/2014 0725   K 4.4 11/22/2014 1304   K 4.1 09/21/2014 0725   CL 103 09/21/2014 0725   CL 103 11/25/2012 1328   CO2 23 11/22/2014 1304   CO2 30 09/21/2014 0725   BUN 14.4  11/22/2014 1304   BUN 11 09/21/2014 0725   CREATININE 0.7 11/22/2014 1304   CREATININE 0.77 09/21/2014 0725      Component Value Date/Time   CALCIUM 8.1* 11/22/2014 1304   CALCIUM 8.3* 09/21/2014 0725   ALKPHOS 61 11/22/2014 1304   ALKPHOS 43 09/21/2014 0725   AST 25 11/22/2014 1304   AST 22 09/21/2014 0725   ALT 21 11/22/2014 1304   ALT 17 09/21/2014 0725   BILITOT 0.40 11/22/2014 1304   BILITOT 0.7 09/21/2014 0725      ASSESSMENT & PLAN:  Multiple myeloma The patient had significant complication related to recent traumatic compression fracture. She will continue on Revlimid for now. She will continue Zometa every 3 months.  recent blood test suggested thatshe is responding to treatment. We will continue the same.    Anemia in neoplastic disease This is likely anemia of chronic disease. The patient denies recent history of bleeding such as epistaxis, hematuria or  hematochezia. She is asymptomatic from the anemia. We will observe for now.    Leukopenia due to antineoplastic chemotherapy This is likely due to recent treatment. The patient denies recent history of fevers, cough, chills, diarrhea or dysuria. She is asymptomatic from the leukopenia. I will observe for now.  I will continue the chemotherapy at current dose without dosage adjustment.  If the leukopenia gets progressive worse in the future, I might have to delay her treatment or adjust the chemotherapy dose.     Neuropathy due to chemotherapeutic drug This is due to previous side effects of Velcade. She increased the dose of gabapentin at nighttime.   Vertebral compression fracture She had severe pain but was unable to tolerate narcotics due to severe constipation. She is doing well with tramadol. I refilled her prescription today.     Depression Overall, her depression and adjustment disorder has improved. I recommend she continues on Remeron.     Left leg pain This is new. I recommend x-ray of her back and left leg to exclude new fracture. In the meantime, she will continue gabapentin and tramadol for pain.   Rib pain on left side She has severe left rib pain. She is taking tramadol for this. I discussed with her the possibility of radiation therapy to control pain and she would like to think about it.    Orders Placed This Encounter  Procedures  . DG Thoracic Spine 2 View    Standing Status: Future     Number of Occurrences: 1     Standing Expiration Date: 11/22/2015    Order Specific Question:  Reason for Exam (SYMPTOM  OR DIAGNOSIS REQUIRED)    Answer:  back pain, ?new fracture    Order Specific Question:  Preferred imaging location?    Answer:  [facility]  . DG Lumbar Spine 2-3 Views    Standing Status: Future     Number of Occurrences: 1     Standing Expiration Date: 11/22/2015    Order Specific Question:  Reason for Exam (SYMPTOM  OR DIAGNOSIS  REQUIRED)    Answer:  back pain, ?new fracture    Order Specific Question:  Preferred imaging location?    Answer:  [place] [facility]    Standing Status: Future     Number of Occurrences:      Standing Expiration Date: 01/22/2016    Order Specific Question:  Reason for Exam (SYMPTOM  OR DIAGNOSIS REQUIRED)    Answer:  left leg  pain    Order Specific Question:  Preferred imaging location?    Answer:  [place] [facility] Left    Standing Status: Future     Number of Occurrences:      Standing Expiration Date: 01/22/2016    Order Specific Question:  Reason for Exam (SYMPTOM  OR DIAGNOSIS REQUIRED)    Answer:  left leg pain    Order Specific Question:  Preferred imaging location?    Answer:  [facility]  . SPEP & IFE with QIG    Standing Status: Future     Number of Occurrences:      Standing Expiration Date: 12/27/2015  . Kappa/lambda light chains    Standing Status: Future     Number of Occurrences:      Standing Expiration Date: 12/27/2015  . Beta 2 microglobulin, serum    Standing Status: Future     Number of Occurrences:      Standing Expiration Date: 12/27/2015   All questions were answered. The patient knows to call the clinic with any problems, questions or concerns. No barriers to learning was detected. I spent 30 minutes counseling the patient face to face. The total time spent in the appointment was 40 minutes and more than 50% was on counseling and review of test results     [facility], [place], MD 11/22/2014 2:37 PM

## 2014-11-22 NOTE — Assessment & Plan Note (Signed)
Overall, her depression and adjustment disorder has improved. I recommend she continues on Remeron.

## 2014-11-22 NOTE — Assessment & Plan Note (Signed)
She has severe left rib pain. She is taking tramadol for this. I discussed with her the possibility of radiation therapy to control pain and she would like to think about it.

## 2014-11-22 NOTE — Assessment & Plan Note (Signed)
This is due to previous side effects of Velcade. She increased the dose of gabapentin at nighttime.

## 2014-11-22 NOTE — Assessment & Plan Note (Signed)
This is likely anemia of chronic disease. The patient denies recent history of bleeding such as epistaxis, hematuria or hematochezia. She is asymptomatic from the anemia. We will observe for now.  

## 2014-11-22 NOTE — Assessment & Plan Note (Signed)
This is new. I recommend x-ray of her back and left leg to exclude new fracture. In the meantime, she will continue gabapentin and tramadol for pain.

## 2014-11-22 NOTE — Assessment & Plan Note (Signed)
She had severe pain but was unable to tolerate narcotics due to severe constipation. She is doing well with tramadol. I refilled her prescription today.

## 2014-11-22 NOTE — Assessment & Plan Note (Signed)
The patient had significant complication related to recent traumatic compression fracture. She will continue on Revlimid for now. She will continue Zometa every 3 months.  recent blood test suggested thatshe is responding to treatment. We will continue the same.

## 2014-11-23 ENCOUNTER — Telehealth: Payer: Self-pay | Admitting: Cardiovascular Disease

## 2014-11-23 ENCOUNTER — Telehealth: Payer: Self-pay | Admitting: *Deleted

## 2014-11-23 NOTE — Telephone Encounter (Signed)
Left VM for pt informing her of Dr. Calton Dach message below.  Asked her to return nurse's call if any questions.

## 2014-11-23 NOTE — Telephone Encounter (Signed)
-----   Message from Heath Lark, MD sent at 11/23/2014 10:48 AM EDT ----- Regarding: XRAY Pls let her know Xray showed no new fractures ----- Message -----    From: Rad Results In Interface    Sent: 11/23/2014   8:53 AM      To: Heath Lark, MD

## 2014-11-23 NOTE — Telephone Encounter (Signed)
Calling stating at her oncology appointment yesterday her BP was 142/34 by the machine.  She denies dizziness or feeling of passing out.  States she was nauseated yesterday after chemo treatment but that is not unusual.  She does not have a BP cuff that she monitors her BP at home.  Other wise she feels fine except for being tired.  Advised to monitor BP since this was a one time reading and to call if dystolic reading continues to be low.  She verbalizes understanding and will monitor.

## 2014-11-23 NOTE — Telephone Encounter (Signed)
Pt c/o BP issue:  1. What are your last 5 BP readings? Only 1 reading. Reading was retrieved during oncology visit. BP 142/33 2. Are you having any other symptoms (ex. Dizziness, headache, blurred vision, passed out)? A lot of nausea on 05/19 in the afternoon  3. What is your medication issue? Pt called. Requests a call back to discuss if she will need to stay on her BP meds or if this is something that she ought to be concerned about. Please call

## 2014-12-10 ENCOUNTER — Other Ambulatory Visit: Payer: Self-pay | Admitting: Hematology and Oncology

## 2014-12-10 ENCOUNTER — Telehealth: Payer: Self-pay | Admitting: *Deleted

## 2014-12-10 ENCOUNTER — Other Ambulatory Visit: Payer: Self-pay | Admitting: *Deleted

## 2014-12-10 MED ORDER — LENALIDOMIDE 10 MG PO CAPS: 10.0000 mg | ORAL_CAPSULE | Freq: Every day | ORAL | Status: DC

## 2014-12-10 NOTE — Telephone Encounter (Signed)
Pls renew

## 2014-12-10 NOTE — Telephone Encounter (Signed)
Left pt message that we faxed refill to pharmacy

## 2014-12-10 NOTE — Telephone Encounter (Signed)
Pt left a message stating today is her last Revlimid pill.

## 2014-12-28 ENCOUNTER — Telehealth: Payer: Self-pay

## 2014-12-28 NOTE — Telephone Encounter (Signed)
Incoming fax from [place] stating [place] needs to do prescriber survey if pt is to continue revlimid. Given to Dr Alvy Bimler RN.

## 2014-12-31 ENCOUNTER — Other Ambulatory Visit: Payer: Self-pay

## 2015-01-02 ENCOUNTER — Other Ambulatory Visit: Payer: Self-pay | Admitting: *Deleted

## 2015-01-02 MED ORDER — LENALIDOMIDE 10 MG PO CAPS: 10.0000 mg | ORAL_CAPSULE | Freq: Every day | ORAL | Status: DC

## 2015-01-08 ENCOUNTER — Telehealth: Payer: Self-pay | Admitting: *Deleted

## 2015-01-08 NOTE — Telephone Encounter (Signed)
Pt left a voice mail stating she pulled her back working in the yard. Wants to know if she should see Dr Alvy Bimler or an orthopedic MD. Dr Alvy Bimler states she should continue tramadol.  Attempted to call patient back, line busy

## 2015-01-09 ENCOUNTER — Telehealth: Payer: Self-pay | Admitting: *Deleted

## 2015-01-09 NOTE — Telephone Encounter (Signed)
Pt notified to take Tramadol for pain. Pt states it feels a little better today

## 2015-01-10 ENCOUNTER — Other Ambulatory Visit (HOSPITAL_BASED_OUTPATIENT_CLINIC_OR_DEPARTMENT_OTHER): Payer: Medicare Other

## 2015-01-10 ENCOUNTER — Ambulatory Visit (HOSPITAL_BASED_OUTPATIENT_CLINIC_OR_DEPARTMENT_OTHER): Payer: Medicare Other

## 2015-01-10 VITALS — BP 141/50 | HR 60 | Temp 98.8°F | Resp 16

## 2015-01-10 DIAGNOSIS — C9000 Multiple myeloma not having achieved remission: Secondary | ICD-10-CM

## 2015-01-10 DIAGNOSIS — Z95828 Presence of other vascular implants and grafts: Secondary | ICD-10-CM

## 2015-01-10 LAB — CBC WITH DIFFERENTIAL/PLATELET
BASO%: 0.3 % (ref 0.0–2.0)
Basophils Absolute: 0 10*3/uL (ref 0.0–0.1)
EOS%: 1 % (ref 0.0–7.0)
Eosinophils Absolute: 0 10*3/uL (ref 0.0–0.5)
HCT: 29.6 % — ABNORMAL LOW (ref 34.8–46.6)
HGB: 9.8 g/dL — ABNORMAL LOW (ref 11.6–15.9)
LYMPH%: 25.9 % (ref 14.0–49.7)
MCH: 32.6 pg (ref 25.1–34.0)
MCHC: 33.1 g/dL (ref 31.5–36.0)
MCV: 98.3 fL (ref 79.5–101.0)
MONO#: 0.5 10*3/uL (ref 0.1–0.9)
MONO%: 15 % — ABNORMAL HIGH (ref 0.0–14.0)
NEUT%: 57.8 % (ref 38.4–76.8)
NEUTROS ABS: 1.7 10*3/uL (ref 1.5–6.5)
Platelets: 189 10*3/uL (ref 145–400)
RBC: 3.01 10*6/uL — ABNORMAL LOW (ref 3.70–5.45)
RDW: 15.5 % — AB (ref 11.2–14.5)
WBC: 3 10*3/uL — ABNORMAL LOW (ref 3.9–10.3)
lymph#: 0.8 10*3/uL — ABNORMAL LOW (ref 0.9–3.3)

## 2015-01-10 LAB — COMPREHENSIVE METABOLIC PANEL (CC13)
ALT: 19 U/L (ref 0–55)
AST: 19 U/L (ref 5–34)
Albumin: 3.1 g/dL — ABNORMAL LOW (ref 3.5–5.0)
Alkaline Phosphatase: 65 U/L (ref 40–150)
Anion Gap: 10 mEq/L (ref 3–11)
BILIRUBIN TOTAL: 0.44 mg/dL (ref 0.20–1.20)
BUN: 13.4 mg/dL (ref 7.0–26.0)
CHLORIDE: 103 meq/L (ref 98–109)
CO2: 24 meq/L (ref 22–29)
CREATININE: 0.7 mg/dL (ref 0.6–1.1)
Calcium: 8.8 mg/dL (ref 8.4–10.4)
EGFR: 78 mL/min/{1.73_m2} — ABNORMAL LOW (ref >90)
Glucose: 94 mg/dl (ref 70–140)
Potassium: 3.8 mEq/L (ref 3.5–5.1)
Sodium: 137 mEq/L (ref 136–145)
Total Protein: 5.8 g/dL — ABNORMAL LOW (ref 6.4–8.3)

## 2015-01-10 MED ORDER — SODIUM CHLORIDE 0.9 % IJ SOLN
10.0000 mL | INTRAMUSCULAR | Status: DC | PRN
  Administered 2015-01-10: 10 mL via INTRAVENOUS
  Filled 2015-01-10: qty 10

## 2015-01-10 MED ORDER — HEPARIN SOD (PORK) LOCK FLUSH 100 UNIT/ML IV SOLN
500.0000 [IU] | Freq: Once | INTRAVENOUS | Status: AC
  Administered 2015-01-10: 500 [IU] via INTRAVENOUS
  Filled 2015-01-10: qty 5

## 2015-01-10 NOTE — Patient Instructions (Signed)

## 2015-01-14 LAB — SPEP & IFE WITH QIG
ALBUMIN ELP: 3.2 g/dL — AB (ref 3.8–4.8)
ALPHA-2-GLOBULIN: 0.7 g/dL (ref 0.5–0.9)
Abnormal Protein Band1: 0.6 g/dL
Alpha-1-Globulin: 0.3 g/dL (ref 0.2–0.3)
Beta 2: 0.2 g/dL (ref 0.2–0.5)
Beta Globulin: 0.4 g/dL (ref 0.4–0.6)
Gamma Globulin: 0.9 g/dL (ref 0.8–1.7)
IgA: 38 mg/dL — ABNORMAL LOW (ref 69–380)
IgG (Immunoglobin G), Serum: 1130 mg/dL (ref 690–1700)
IgM, Serum: 52 mg/dL (ref 52–322)
TOTAL PROTEIN, SERUM ELECTROPHOR: 5.6 g/dL — AB (ref 6.1–8.1)

## 2015-01-14 LAB — KAPPA/LAMBDA LIGHT CHAINS
Kappa free light chain: 4.48 mg/dL — ABNORMAL HIGH (ref 0.33–1.94)
Kappa:Lambda Ratio: 4.04 — ABNORMAL HIGH (ref 0.26–1.65)
Lambda Free Lght Chn: 1.11 mg/dL (ref 0.57–2.63)

## 2015-01-14 LAB — BETA 2 MICROGLOBULIN, SERUM: Beta-2 Microglobulin: 2.18 mg/L (ref <=2.51)

## 2015-01-17 ENCOUNTER — Encounter: Payer: Self-pay | Admitting: Hematology and Oncology

## 2015-01-17 ENCOUNTER — Ambulatory Visit (HOSPITAL_BASED_OUTPATIENT_CLINIC_OR_DEPARTMENT_OTHER): Payer: Medicare Other | Admitting: Hematology and Oncology

## 2015-01-17 ENCOUNTER — Ambulatory Visit (HOSPITAL_BASED_OUTPATIENT_CLINIC_OR_DEPARTMENT_OTHER): Payer: Medicare Other

## 2015-01-17 VITALS — BP 147/52 | HR 72 | Temp 98.0°F | Resp 18 | Ht 65.0 in | Wt 116.1 lb

## 2015-01-17 DIAGNOSIS — D701 Agranulocytosis secondary to cancer chemotherapy: Secondary | ICD-10-CM | POA: Diagnosis not present

## 2015-01-17 DIAGNOSIS — D6481 Anemia due to antineoplastic chemotherapy: Secondary | ICD-10-CM | POA: Diagnosis not present

## 2015-01-17 DIAGNOSIS — C9000 Multiple myeloma not having achieved remission: Secondary | ICD-10-CM

## 2015-01-17 DIAGNOSIS — M129 Arthropathy, unspecified: Secondary | ICD-10-CM

## 2015-01-17 DIAGNOSIS — T451X5A Adverse effect of antineoplastic and immunosuppressive drugs, initial encounter: Secondary | ICD-10-CM

## 2015-01-17 DIAGNOSIS — IMO0001 Reserved for inherently not codable concepts without codable children: Secondary | ICD-10-CM

## 2015-01-17 DIAGNOSIS — F329 Major depressive disorder, single episode, unspecified: Secondary | ICD-10-CM | POA: Diagnosis not present

## 2015-01-17 DIAGNOSIS — F32A Depression, unspecified: Secondary | ICD-10-CM

## 2015-01-17 DIAGNOSIS — M4850XS Collapsed vertebra, not elsewhere classified, site unspecified, sequela of fracture: Secondary | ICD-10-CM | POA: Diagnosis not present

## 2015-01-17 MED ORDER — HEPARIN SOD (PORK) LOCK FLUSH 100 UNIT/ML IV SOLN
500.0000 [IU] | Freq: Once | INTRAVENOUS | Status: AC | PRN
  Administered 2015-01-17: 500 [IU]
  Filled 2015-01-17: qty 5

## 2015-01-17 MED ORDER — SODIUM CHLORIDE 0.9 % IJ SOLN
10.0000 mL | INTRAMUSCULAR | Status: DC | PRN
  Administered 2015-01-17: 10 mL
  Filled 2015-01-17: qty 10

## 2015-01-17 MED ORDER — ZOLEDRONIC ACID 4 MG/5ML IV CONC
3.3000 mg | Freq: Once | INTRAVENOUS | Status: AC
  Administered 2015-01-17: 3.3 mg via INTRAVENOUS
  Filled 2015-01-17: qty 4.13

## 2015-01-17 MED ORDER — SODIUM CHLORIDE 0.9 % IV SOLN
Freq: Once | INTRAVENOUS | Status: AC
  Administered 2015-01-17: 15:00:00 via INTRAVENOUS

## 2015-01-17 MED ORDER — TRAMADOL HCL 50 MG PO TABS: 50.0000 mg | ORAL_TABLET | Freq: Four times a day (QID) | ORAL | Status: DC | PRN

## 2015-01-17 MED ORDER — DEXAMETHASONE 4 MG PO TABS: ORAL_TABLET | ORAL | Status: DC

## 2015-01-17 NOTE — Assessment & Plan Note (Signed)
She tolerated Remeron poorly. Recommend she cut the dose of half. She complained of some restless leg symptoms with Remeron. I recommend she try half a tablet along with one iron supplement at nighttime.

## 2015-01-17 NOTE — Assessment & Plan Note (Signed)
This is likely anemia of chronic disease and Revlimid. The patient denies recent history of bleeding such as epistaxis, hematuria or hematochezia. She is asymptomatic from the anemia. We will observe for now.

## 2015-01-17 NOTE — Progress Notes (Signed)
[place] OFFICE PROGRESS NOTE  Patient Care Team: Thressa Sheller, MD as PCP - General (Internal Medicine) Thressa Sheller, MD (Internal Medicine) Renella Cunas, MD as Attending Physician (Cardiology) Heath Lark, MD as Consulting Physician (Hematology and Oncology) Luanne Bras, MD as Consulting Physician (Interventional Radiology)  SUMMARY OF ONCOLOGIC HISTORY: Oncology History   The     Multiple myeloma   11/12/2008 Imaging Skeletal survey showed lytic lesion in the right femur compatible with  myeloma. There were Questionable skull lesions   11/15/2008 Bone Marrow Biopsy BONE MARROW ASPIRATE AND BIOPSY: showed NORMOCELLULAR MARROW FOR AGE WITH PLASMA CELL DYSCRASIA  (PLASMA CELLS 25%). 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 Subsequently she was placed on Revlimid & Dexamethasone alone   05/12/2013 Bone Marrow Biopsy Bone marrow biopsy showed persistent plasma cells. Blood work confirmed VGPR status   06/11/2014 Tumor Marker Bloodwork show disease relapse   06/26/2014 Procedure She has placement of port   07/05/2014 - 07/26/2014 Chemotherapy She received Elotuzumab and Revlimid. Rx was discontinued with elotuzumab per patient preference   08/01/2014 - 08/05/2014 Hospital Admission She was admitted to the hospital for kyphoplasty and pain management after traumatic compression fracture    INTERVAL HISTORY: Please see below for problem oriented charting. She returns for further follow-up. Her back pain is improved with tramadol. She takes an average 2-3 tablets per day. She also complained of restless leg at nighttime if she takes Remeron. Her mood is stable. She denies recent infection. She denies signs and symptoms of anemia such as shortness of breath or dizziness. The patient denies any recent signs or symptoms  of bleeding such as spontaneous epistaxis, hematuria or hematochezia.   REVIEW OF SYSTEMS:   Constitutional: Denies fevers, chills or abnormal weight loss Eyes: Denies blurriness of vision Ears, nose, mouth, throat, and face: Denies mucositis or sore throat Respiratory: Denies cough, dyspnea or wheezes Cardiovascular: Denies palpitation, chest discomfort or lower extremity swelling Gastrointestinal:  Denies nausea, heartburn or change in bowel habits Skin: Denies abnormal skin rashes Lymphatics: Denies new lymphadenopathy or easy bruising Neurological:Denies numbness, tingling or new weaknesses Behavioral/Psych: Mood is stable, no new changes  All other systems were reviewed with the patient and are negative.  I have reviewed the past medical history, past surgical history, social history and family history with the patient and they are unchanged from previous note.  ALLERGIES:  has No Known Allergies.  MEDICATIONS:  Current Outpatient Prescriptions  Medication Sig Dispense Refill  . acetaminophen (TYLENOL) 325 MG tablet Take 650 mg by mouth every 6 (six) hours as needed for mild pain.     [name] aspirin 325 MG EC tablet Take 325 mg by mouth daily.    . cholecalciferol (VITAMIN D) 1000 UNITS tablet Take 1,000 Units by mouth daily.    [name] dexamethasone (DECADRON) 4 MG tablet Take 1 tablet every Monday 12 tablet 0  . diphenhydramine-acetaminophen (TYLENOL PM) 25-500 MG TABS Take 1 tablet by mouth at bedtime as needed (Sleep).     . gabapentin (NEURONTIN) 300 MG capsule TAKE ONE CAPSULE BY MOUTH THREE TIMES DAILY 90 capsule 3  . lenalidomide (REVLIMID) 10 MG capsule Take 1 capsule (10 mg total) by mouth daily. 21 capsule 0  . levothyroxine (SYNTHROID, LEVOTHROID) 50 MCG tablet Take 50 mcg by mouth daily before breakfast.    . lidocaine-prilocaine (EMLA) cream Apply 1 application topically as  needed. Apply to [facility] a cath site at least one hour prior to [employer]. 30 g 2  . loperamide (IMODIUM  A-D) 2 MG tablet Take 2 mg by mouth as needed for diarrhea or loose stools (as directed.).    [name] Multiple Vitamin (MULTIVITAMIN WITH MINERALS) TABS tablet Take 1 tablet by mouth daily.    . Polyvinyl Alcohol-Povidone (REFRESH OP) Place 2 drops into both eyes 2 (two) times daily as needed (dry eyes).    [name] PRESCRIPTION MEDICATION Chemo at [facility]    . traMADol (ULTRAM) 50 MG tablet Take 1 tablet (50 mg total) by mouth every 6 (six) hours as needed for moderate pain. 90 tablet 0  . valsartan (DIOVAN) 160 MG tablet Take 1 tablet (160 mg total) by mouth daily. 30 tablet 3   No current facility-administered medications for this visit.   Facility-Administered Medications Ordered in Other Visits  Medication Dose Route Frequency Provider Last Rate Last Dose  . sodium chloride 0.9 % injection 10 mL  10 mL Intracatheter PRN Heath Lark, MD   10 mL at 01/17/15 1610    PHYSICAL EXAMINATION: ECOG PERFORMANCE STATUS: 1 - Symptomatic but completely ambulatory  Filed Vitals:   01/17/15 1437  BP: 147/52  Pulse: 72  Temp: 98 F (36.7 C)  Resp: 18   Filed Weights   01/17/15 1437  Weight: 116 lb 1.6 oz (52.663 kg)    GENERAL:alert, no distress and comfortable SKIN: skin color, texture, turgor are normal, no rashes or significant lesions EYES: normal, Conjunctiva are pink and non-injected, sclera clear OROPHARYNX:no exudate, no erythema and lips, buccal mucosa, and tongue normal  NECK: supple, thyroid normal size, non-tender, without nodularity LYMPH:  no palpable lymphadenopathy in the cervical, axillary or inguinal LUNGS: clear to auscultation and percussion with normal breathing effort HEART: regular rate & rhythm and no murmurs and no lower extremity edema ABDOMEN:abdomen soft, non-tender and normal bowel sounds Musculoskeletal:no cyanosis of digits and no clubbing  NEURO: alert & oriented x 3 with fluent speech, no focal motor/sensory deficits  LABORATORY DATA:  I have reviewed the data as  listed    Component Value Date/Time   NA 137 01/10/2015 1359   NA 138 09/21/2014 0725   K 3.8 01/10/2015 1359   K 4.1 09/21/2014 0725   CL 103 09/21/2014 0725   CL 103 11/25/2012 1328   CO2 24 01/10/2015 1359   CO2 30 09/21/2014 0725   GLUCOSE 94 01/10/2015 1359   GLUCOSE 76 09/21/2014 0725   GLUCOSE 91 11/25/2012 1328   BUN 13.4 01/10/2015 1359   BUN 11 09/21/2014 0725   CREATININE 0.7 01/10/2015 1359   CREATININE 0.77 09/21/2014 0725   CALCIUM 8.8 01/10/2015 1359   CALCIUM 8.3* 09/21/2014 0725   PROT 5.8* 01/10/2015 1359   PROT 5.7* 09/21/2014 0725   ALBUMIN 3.1* 01/10/2015 1359   ALBUMIN 3.0* 09/21/2014 0725   AST 19 01/10/2015 1359   AST 22 09/21/2014 0725   ALT 19 01/10/2015 1359   ALT 17 09/21/2014 0725   ALKPHOS 65 01/10/2015 1359   ALKPHOS 43 09/21/2014 0725   BILITOT 0.44 01/10/2015 1359   BILITOT 0.7 09/21/2014 0725   GFRNONAA 74* 09/21/2014 0725   GFRAA 86* 09/21/2014 0725    No results found for: SPEP, UPEP  Lab Results  Component Value Date   WBC 3.0* 01/10/2015   NEUTROABS 1.7 01/10/2015   HGB 9.8* 01/10/2015   HCT 29.6* 01/10/2015   MCV 98.3 01/10/2015   PLT 189  01/10/2015      Chemistry      Component Value Date/Time   NA 137 01/10/2015 1359   NA 138 09/21/2014 0725   K 3.8 01/10/2015 1359   K 4.1 09/21/2014 0725   CL 103 09/21/2014 0725   CL 103 11/25/2012 1328   CO2 24 01/10/2015 1359   CO2 30 09/21/2014 0725   BUN 13.4 01/10/2015 1359   BUN 11 09/21/2014 0725   CREATININE 0.7 01/10/2015 1359   CREATININE 0.77 09/21/2014 0725      Component Value Date/Time   CALCIUM 8.8 01/10/2015 1359   CALCIUM 8.3* 09/21/2014 0725   ALKPHOS 65 01/10/2015 1359   ALKPHOS 43 09/21/2014 0725   AST 19 01/10/2015 1359   AST 22 09/21/2014 0725   ALT 19 01/10/2015 1359   ALT 17 09/21/2014 0725   BILITOT 0.44 01/10/2015 1359   BILITOT 0.7 09/21/2014 0725      ASSESSMENT & PLAN:  Multiple myeloma The patient had significant complication related  to recent traumatic compression fracture. She will continue on Revlimid for now. She will continue Zometa every 3 months.  recent blood test suggested that she is responding to treatment. We will continue the same.  I will start tapering her dexamethasone to once a week and then stop when I see her next time.    Anemia due to antineoplastic chemotherapy This is likely anemia of chronic disease and Revlimid. The patient denies recent history of bleeding such as epistaxis, hematuria or hematochezia. She is asymptomatic from the anemia. We will observe for now.     Leukopenia due to antineoplastic chemotherapy This is likely due to recent treatment. The patient denies recent history of fevers, cough, chills, diarrhea or dysuria. She is asymptomatic from the leukopenia. I will observe for now.  I will continue the chemotherapy at current dose without dosage adjustment.  If the leukopenia gets progressive worse in the future, I might have to delay her treatment or adjust the chemotherapy dose.      Vertebral compression fracture She had severe pain but was unable to tolerate narcotics due to severe constipation. She is doing well with tramadol. I refilled her prescription today.    Depression She tolerated Remeron poorly. Recommend she cut the dose of half. She complained of some restless leg symptoms with Remeron. I recommend she try half a tablet along with one iron supplement at nighttime.   Orders Placed This Encounter  Procedures  . SPEP & IFE with QIG    Standing Status: Future     Number of Occurrences:      Standing Expiration Date: 02/21/2016  . Kappa/lambda light chains    Standing Status: Future     Number of Occurrences:      Standing Expiration Date: 02/21/2016   All questions were answered. The patient knows to call the clinic with any problems, questions or concerns. No barriers to learning was detected. I spent 25 minutes counseling the patient face to face. The  total time spent in the appointment was 30 minutes and more than 50% was on counseling and review of test results     [facility], [place], MD 01/17/2015 5:22 PM

## 2015-01-17 NOTE — Assessment & Plan Note (Signed)
The patient had significant complication related to recent traumatic compression fracture. She will continue on Revlimid for now. She will continue Zometa every 3 months.  recent blood test suggested that she is responding to treatment. We will continue the same.  I will start tapering her dexamethasone to once a week and then stop when I see her next time.

## 2015-01-17 NOTE — Assessment & Plan Note (Signed)
This is likely due to recent treatment. The patient denies recent history of fevers, cough, chills, diarrhea or dysuria. She is asymptomatic from the leukopenia. I will observe for now.  I will continue the chemotherapy at current dose without dosage adjustment.  If the leukopenia gets progressive worse in the future, I might have to delay her treatment or adjust the chemotherapy dose.   

## 2015-01-17 NOTE — Assessment & Plan Note (Signed)
She had severe pain but was unable to tolerate narcotics due to severe constipation. She is doing well with tramadol. I refilled her prescription today.

## 2015-01-17 NOTE — Patient Instructions (Signed)

## 2015-01-18 ENCOUNTER — Telehealth: Payer: Self-pay | Admitting: Hematology and Oncology

## 2015-01-18 NOTE — Telephone Encounter (Signed)
s.w. pt and advised on OCT appt....pt ok and aware °

## 2015-01-21 ENCOUNTER — Encounter: Payer: Self-pay | Admitting: *Deleted

## 2015-01-21 ENCOUNTER — Telehealth: Payer: Self-pay | Admitting: *Deleted

## 2015-01-21 ENCOUNTER — Other Ambulatory Visit: Payer: Self-pay | Admitting: *Deleted

## 2015-01-21 MED ORDER — MAGIC MOUTHWASH W/LIDOCAINE: 5.0000 mL | Freq: Three times a day (TID) | ORAL | Status: DC

## 2015-01-21 NOTE — Telephone Encounter (Signed)
Pt left message stating she has mouth sores. Is requesting magic mouthwash as she has used it in the past with good results

## 2015-01-21 NOTE — Telephone Encounter (Signed)
Can you call it in with lidocaine?

## 2015-01-30 ENCOUNTER — Other Ambulatory Visit: Payer: Self-pay | Admitting: *Deleted

## 2015-01-30 MED ORDER — LENALIDOMIDE 10 MG PO CAPS: 10.0000 mg | ORAL_CAPSULE | Freq: Every day | ORAL | Status: DC

## 2015-02-07 ENCOUNTER — Telehealth: Payer: Self-pay | Admitting: *Deleted

## 2015-02-07 NOTE — Telephone Encounter (Signed)
Pt left VM asking if there is anything else Dr. Alvy Bimler can prescribe for her appetite?  She cannot tolerate the Remeron because it causes her to have restless legs, even when she takes a half pill at night.  Pt says she has decreased appetite and lost some more weight.

## 2015-02-08 NOTE — Telephone Encounter (Signed)
Megace 40 mg BID but that would increase her risks for blood clots If she is willing to take the risk, e scribe 30 days supply w 3 refills

## 2015-02-08 NOTE — Telephone Encounter (Signed)
Informed pt of Megace and risk of blood clots.  She says she does not want to try this right now.  She would rather try the remeron again and deal with the restless legs.  Also suggested pt try drinking some nutritional supplements such as Boost or Ensure twice a day.  She verbalized understanding.

## 2015-02-25 ENCOUNTER — Other Ambulatory Visit: Payer: Self-pay | Admitting: *Deleted

## 2015-02-25 MED ORDER — LENALIDOMIDE 10 MG PO CAPS: 10.0000 mg | ORAL_CAPSULE | Freq: Every day | ORAL | Status: DC

## 2015-02-27 ENCOUNTER — Other Ambulatory Visit: Payer: Self-pay | Admitting: *Deleted

## 2015-02-27 DIAGNOSIS — C9000 Multiple myeloma not having achieved remission: Secondary | ICD-10-CM

## 2015-02-27 MED ORDER — LENALIDOMIDE 10 MG PO CAPS: 10.0000 mg | ORAL_CAPSULE | Freq: Every day | ORAL | Status: DC

## 2015-02-27 NOTE — Telephone Encounter (Signed)
Medication escribed and resent as faxed copy to [facility] for Revlimid was not signed.

## 2015-03-09 ENCOUNTER — Other Ambulatory Visit: Payer: Self-pay | Admitting: Hematology and Oncology

## 2015-03-21 ENCOUNTER — Telehealth: Payer: Self-pay | Admitting: *Deleted

## 2015-03-21 NOTE — Telephone Encounter (Signed)
Pt left VM concerned about weight loss.  States she continues to lose weight and down to 112 Lbs.   She says her normal weight a year ago was 128 lbs.  She asks if this is side effect of Revlimid or anything she should be concerned about?

## 2015-03-21 NOTE — Telephone Encounter (Signed)
Weight loss is not a side-effect from Revlimid She could have lost a lot of muscle mass when she was sick earlier in the year from the fracture If it would make her feel better I could order PET scan to exclude other causes

## 2015-03-22 NOTE — Telephone Encounter (Signed)
Informed pt of Dr. Calton Dach reply below.  Pt says she will wait to discuss w/ Dr. Alvy Bimler on her next visit in a few weeks.

## 2015-04-02 ENCOUNTER — Other Ambulatory Visit: Payer: Self-pay | Admitting: *Deleted

## 2015-04-02 DIAGNOSIS — C9000 Multiple myeloma not having achieved remission: Secondary | ICD-10-CM

## 2015-04-02 MED ORDER — LENALIDOMIDE 10 MG PO CAPS: 10.0000 mg | ORAL_CAPSULE | Freq: Every day | ORAL | Status: DC

## 2015-04-11 ENCOUNTER — Ambulatory Visit (HOSPITAL_BASED_OUTPATIENT_CLINIC_OR_DEPARTMENT_OTHER): Payer: Medicare Other

## 2015-04-11 ENCOUNTER — Other Ambulatory Visit (HOSPITAL_BASED_OUTPATIENT_CLINIC_OR_DEPARTMENT_OTHER): Payer: Medicare Other

## 2015-04-11 DIAGNOSIS — C9000 Multiple myeloma not having achieved remission: Secondary | ICD-10-CM | POA: Diagnosis present

## 2015-04-11 DIAGNOSIS — Z95828 Presence of other vascular implants and grafts: Secondary | ICD-10-CM

## 2015-04-11 LAB — CBC WITH DIFFERENTIAL/PLATELET
BASO%: 1 % (ref 0.0–2.0)
Basophils Absolute: 0 10*3/uL (ref 0.0–0.1)
EOS%: 2.1 % (ref 0.0–7.0)
Eosinophils Absolute: 0.1 10*3/uL (ref 0.0–0.5)
HCT: 31.6 % — ABNORMAL LOW (ref 34.8–46.6)
HGB: 10.7 g/dL — ABNORMAL LOW (ref 11.6–15.9)
LYMPH%: 30.8 % (ref 14.0–49.7)
MCH: 32.7 pg (ref 25.1–34.0)
MCHC: 33.9 g/dL (ref 31.5–36.0)
MCV: 96.6 fL (ref 79.5–101.0)
MONO#: 0.5 10*3/uL (ref 0.1–0.9)
MONO%: 15.7 % — ABNORMAL HIGH (ref 0.0–14.0)
NEUT%: 50.4 % (ref 38.4–76.8)
NEUTROS ABS: 1.4 10*3/uL — AB (ref 1.5–6.5)
PLATELETS: 195 10*3/uL (ref 145–400)
RBC: 3.27 10*6/uL — AB (ref 3.70–5.45)
RDW: 15.9 % — AB (ref 11.2–14.5)
WBC: 2.9 10*3/uL — AB (ref 3.9–10.3)
lymph#: 0.9 10*3/uL (ref 0.9–3.3)

## 2015-04-11 LAB — COMPREHENSIVE METABOLIC PANEL (CC13)
ALT: 24 U/L (ref 0–55)
AST: 28 U/L (ref 5–34)
Albumin: 3.5 g/dL (ref 3.5–5.0)
Alkaline Phosphatase: 67 U/L (ref 40–150)
Anion Gap: 5 mEq/L (ref 3–11)
BUN: 13.9 mg/dL (ref 7.0–26.0)
CO2: 28 meq/L (ref 22–29)
Calcium: 8.9 mg/dL (ref 8.4–10.4)
Chloride: 105 mEq/L (ref 98–109)
Creatinine: 0.7 mg/dL (ref 0.6–1.1)
EGFR: 74 mL/min/{1.73_m2} — ABNORMAL LOW (ref >90)
Glucose: 105 mg/dl (ref 70–140)
Potassium: 3.8 mEq/L (ref 3.5–5.1)
Sodium: 138 mEq/L (ref 136–145)
Total Bilirubin: 0.55 mg/dL (ref 0.20–1.20)
Total Protein: 6.1 g/dL — ABNORMAL LOW (ref 6.4–8.3)

## 2015-04-11 MED ORDER — HEPARIN SOD (PORK) LOCK FLUSH 100 UNIT/ML IV SOLN
500.0000 [IU] | Freq: Once | INTRAVENOUS | Status: AC
Start: 2015-04-11 — End: 2015-04-11
  Administered 2015-04-11: 500 [IU] via INTRAVENOUS
  Filled 2015-04-11: qty 5

## 2015-04-11 MED ORDER — SODIUM CHLORIDE 0.9 % IJ SOLN
10.0000 mL | INTRAMUSCULAR | Status: DC | PRN
  Administered 2015-04-11: 10 mL via INTRAVENOUS
  Filled 2015-04-11: qty 10

## 2015-04-11 NOTE — Patient Instructions (Signed)

## 2015-04-15 LAB — SPEP & IFE WITH QIG
ALBUMIN ELP: 3.4 g/dL — AB (ref 3.8–4.8)
Abnormal Protein Band1: 0.6 g/dL
Alpha-1-Globulin: 0.2 g/dL (ref 0.2–0.3)
Alpha-2-Globulin: 0.7 g/dL (ref 0.5–0.9)
Beta 2: 0.2 g/dL (ref 0.2–0.5)
Beta Globulin: 0.4 g/dL (ref 0.4–0.6)
Gamma Globulin: 1 g/dL (ref 0.8–1.7)
IGA: 47 mg/dL — AB (ref 69–380)
IgG (Immunoglobin G), Serum: 1060 mg/dL (ref 690–1700)
IgM, Serum: 73 mg/dL (ref 52–322)
Total Protein, Serum Electrophoresis: 5.8 g/dL — ABNORMAL LOW (ref 6.1–8.1)

## 2015-04-15 LAB — KAPPA/LAMBDA LIGHT CHAINS
Kappa free light chain: 4.44 mg/dL — ABNORMAL HIGH (ref 0.33–1.94)
Kappa:Lambda Ratio: 3.24 — ABNORMAL HIGH (ref 0.26–1.65)
Lambda Free Lght Chn: 1.37 mg/dL (ref 0.57–2.63)

## 2015-04-18 ENCOUNTER — Encounter: Payer: Self-pay | Admitting: Hematology and Oncology

## 2015-04-18 ENCOUNTER — Telehealth: Payer: Self-pay | Admitting: Hematology and Oncology

## 2015-04-18 ENCOUNTER — Ambulatory Visit (HOSPITAL_BASED_OUTPATIENT_CLINIC_OR_DEPARTMENT_OTHER): Payer: Medicare Other

## 2015-04-18 ENCOUNTER — Telehealth: Payer: Self-pay | Admitting: *Deleted

## 2015-04-18 ENCOUNTER — Ambulatory Visit (HOSPITAL_BASED_OUTPATIENT_CLINIC_OR_DEPARTMENT_OTHER): Payer: Medicare Other | Admitting: Hematology and Oncology

## 2015-04-18 VITALS — BP 151/52 | HR 67 | Temp 98.0°F | Resp 18 | Ht 65.0 in | Wt 114.1 lb

## 2015-04-18 DIAGNOSIS — C9001 Multiple myeloma in remission: Secondary | ICD-10-CM | POA: Diagnosis present

## 2015-04-18 DIAGNOSIS — F329 Major depressive disorder, single episode, unspecified: Secondary | ICD-10-CM | POA: Diagnosis not present

## 2015-04-18 DIAGNOSIS — C9000 Multiple myeloma not having achieved remission: Secondary | ICD-10-CM

## 2015-04-18 DIAGNOSIS — D708 Other neutropenia: Secondary | ICD-10-CM | POA: Diagnosis not present

## 2015-04-18 DIAGNOSIS — F32A Depression, unspecified: Secondary | ICD-10-CM

## 2015-04-18 DIAGNOSIS — T451X5A Adverse effect of antineoplastic and immunosuppressive drugs, initial encounter: Secondary | ICD-10-CM

## 2015-04-18 DIAGNOSIS — D61810 Antineoplastic chemotherapy induced pancytopenia: Secondary | ICD-10-CM | POA: Diagnosis not present

## 2015-04-18 DIAGNOSIS — G620 Drug-induced polyneuropathy: Secondary | ICD-10-CM

## 2015-04-18 MED ORDER — SODIUM CHLORIDE 0.9 % IJ SOLN
10.0000 mL | INTRAMUSCULAR | Status: DC | PRN
  Administered 2015-04-18: 10 mL
  Filled 2015-04-18: qty 10

## 2015-04-18 MED ORDER — HEPARIN SOD (PORK) LOCK FLUSH 100 UNIT/ML IV SOLN
500.0000 [IU] | Freq: Once | INTRAVENOUS | Status: AC | PRN
  Administered 2015-04-18: 500 [IU]
  Filled 2015-04-18: qty 5

## 2015-04-18 MED ORDER — ZOLEDRONIC ACID 4 MG/5ML IV CONC
3.3000 mg | Freq: Once | INTRAVENOUS | Status: AC
  Administered 2015-04-18: 3.3 mg via INTRAVENOUS
  Filled 2015-04-18: qty 4.13

## 2015-04-18 MED ORDER — SODIUM CHLORIDE 0.9 % IV SOLN
Freq: Once | INTRAVENOUS | Status: AC
  Administered 2015-04-18: 14:00:00 via INTRAVENOUS

## 2015-04-18 NOTE — Assessment & Plan Note (Signed)
This is due to previous side effects of Velcade. She is using gabapentin at nighttime.

## 2015-04-18 NOTE — Assessment & Plan Note (Signed)
This is likely due to recent treatment. The patient denies recent history of fevers, cough, chills, diarrhea or dysuria. She is asymptomatic from the leukopenia. I will observe for now.    

## 2015-04-18 NOTE — Assessment & Plan Note (Signed)
The patient had significant complication related to traumatic compression fracture. Her recent blood work show she is in [place]. The patient have significant deconditioning and continues to have become unmotivated and with weight loss. I recommend stopping treatment and give her a treatment break. I will proceed with Zometa today. I will get the port removed

## 2015-04-18 NOTE — Telephone Encounter (Signed)
OK 

## 2015-04-18 NOTE — Assessment & Plan Note (Signed)
This is likely anemia of chronic disease and Revlimid. The patient denies recent history of bleeding such as epistaxis, hematuria or hematochezia. She is asymptomatic from the anemia. We will observe for now.    

## 2015-04-18 NOTE — Assessment & Plan Note (Signed)
She is delighted that we will be stopping treatment. She has lost some weight and has been prescribed Remeron. With her anorexia and weight loss, we discussed about other treatment options. She is motivated to try to eat more without assistance from medications.

## 2015-04-18 NOTE — Patient Instructions (Signed)

## 2015-04-18 NOTE — Telephone Encounter (Signed)
Daughter called, wants Dr. Alvy Bimler to be aware that pt has had a lot of depression lately and just "doesn't feel good."   Pt wants to attend her granddaughter's wedding New Years Eve at the [place], but is worried she will not feel well enough to travel.   Daughter says pt wants to ask Dr. Alvy Bimler if it is ok to go off Revlimid for a few months, if her labs are ok.  She is convinced she feels better off the Revlimid and this will help her make it to her [place] daughters wedding in a few months.    Daughter called to make sure Dr. Alvy Bimler aware of this for her appt today.  Daughter concerned that pt will not communicate this to Dr. Alvy Bimler herself.

## 2015-04-18 NOTE — Telephone Encounter (Signed)
Gave adn printed appt scehd and avs for pt for March 2017

## 2015-04-18 NOTE — Progress Notes (Signed)
[place] OFFICE PROGRESS NOTE  Patient Care Team: Thressa Sheller, MD as PCP - General (Internal Medicine) Thressa Sheller, MD (Internal Medicine) Renella Cunas, MD as Attending Physician (Cardiology) Heath Lark, MD as Consulting Physician (Hematology and Oncology) Luanne Bras, MD as Consulting Physician (Interventional Radiology)  SUMMARY OF ONCOLOGIC HISTORY: Oncology History   The     Multiple myeloma in remission [facility])   11/12/2008 Imaging Skeletal survey showed lytic lesion in the right femur compatible with  myeloma. There were Questionable skull lesions   11/15/2008 Bone Marrow Biopsy BONE MARROW ASPIRATE AND BIOPSY: showed NORMOCELLULAR MARROW FOR AGE WITH PLASMA CELL DYSCRASIA  (PLASMA CELLS 25%). 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 Subsequently she was placed on Revlimid & Dexamethasone alone   05/12/2013 Bone Marrow Biopsy Bone marrow biopsy showed persistent plasma cells. Blood work confirmed VGPR status   06/11/2014 Tumor Marker Bloodwork show disease relapse   06/26/2014 Procedure She has placement of port   07/05/2014 - 07/26/2014 Chemotherapy She received Elotuzumab and Revlimid. Rx was discontinued with elotuzumab per patient preference   08/01/2014 - 08/05/2014 Hospital Admission She was admitted to the hospital for kyphoplasty and pain management after traumatic compression fracture   08/23/2014 - 04/18/2015 Chemotherapy She was placed on dexamethasone & Revlimid   04/11/2015 Tumor Marker Blood work showed VGPR. Treatment was discontinued and she is maintained on Zometa only    INTERVAL HISTORY: Please see below for problem oriented charting. She feels well but have persistent anorexia and weight loss. She denies worsening back pain or neuropathic pain. Denies recent infection.  REVIEW OF  SYSTEMS:   Constitutional: Denies fevers, chills  Eyes: Denies blurriness of vision Ears, nose, mouth, throat, and face: Denies mucositis or sore throat Respiratory: Denies cough, dyspnea or wheezes Cardiovascular: Denies palpitation, chest discomfort or lower extremity swelling Gastrointestinal:  Denies nausea, heartburn or change in bowel habits Skin: Denies abnormal skin rashes Lymphatics: Denies new lymphadenopathy or easy bruising Neurological:Denies numbness, tingling or new weaknesses Behavioral/Psych: Mood is stable, no new changes  All other systems were reviewed with the patient and are negative.  I have reviewed the past medical history, past surgical history, social history and family history with the patient and they are unchanged from previous note.  ALLERGIES:  has No Known Allergies.  MEDICATIONS:  Current Outpatient Prescriptions  Medication Sig Dispense Refill  . acetaminophen (TYLENOL) 325 MG tablet Take 650 mg by mouth every 6 (six) hours as needed for mild pain.     [name] Alum & Mag Hydroxide-Simeth (MAGIC MOUTHWASH W/LIDOCAINE) SOLN Take 5 mLs by mouth 3 (three) times daily. 200 mL 0  . aspirin 325 MG EC tablet Take 325 mg by mouth daily.    . cholecalciferol (VITAMIN D) 1000 UNITS tablet Take 1,000 Units by mouth daily.    [name] dexamethasone (DECADRON) 4 MG tablet Take 1 tablet every Monday 12 tablet 0  . diphenhydramine-acetaminophen (TYLENOL PM) 25-500 MG TABS Take 1 tablet by mouth at bedtime as needed (Sleep).     . gabapentin (NEURONTIN) 300 MG capsule TAKE ONE CAPSULE BY MOUTH THREE TIMES DAILY 90 capsule 3  . lenalidomide (REVLIMID) 10 MG capsule Take 1 capsule (10 mg total) by mouth daily. 21 capsule 0  . levothyroxine (SYNTHROID, LEVOTHROID) 50 MCG tablet Take 50 mcg by mouth daily before breakfast.    . lidocaine-prilocaine (EMLA) cream Apply 1  application topically as needed. Apply to [facility] a cath site at least one hour prior to [employer]. 30 g 2  .  loperamide (IMODIUM A-D) 2 MG tablet Take 2 mg by mouth as needed for diarrhea or loose stools (as directed.).    [name] mirtazapine (REMERON) 7.5 MG tablet TAKE ONE TABLET BY MOUTH NIGHTLY AT BEDTIME 30 tablet 3  . Multiple Vitamin (MULTIVITAMIN WITH MINERALS) TABS tablet Take 1 tablet by mouth daily.    . Polyvinyl Alcohol-Povidone (REFRESH OP) Place 2 drops into both eyes 2 (two) times daily as needed (dry eyes).    [name] PRESCRIPTION MEDICATION Chemo at [facility]    . traMADol (ULTRAM) 50 MG tablet Take 1 tablet (50 mg total) by mouth every 6 (six) hours as needed for moderate pain. 90 tablet 0  . valsartan (DIOVAN) 160 MG tablet Take 1 tablet (160 mg total) by mouth daily. 30 tablet 3   No current facility-administered medications for this visit.   Facility-Administered Medications Ordered in Other Visits  Medication Dose Route Frequency Provider Last Rate Last Dose  . 0.9 %  sodium chloride infusion   Intravenous Once Heath Lark, MD      . heparin lock flush 100 unit/mL  500 Units Intracatheter Once PRN Heath Lark, MD      . sodium chloride 0.9 % injection 10 mL  10 mL Intracatheter PRN Heath Lark, MD      . zolendronic acid (ZOMETA) 4 mg in sodium chloride 0.9 % 100 mL IVPB  4 mg Intravenous Once Heath Lark, MD        PHYSICAL EXAMINATION: ECOG PERFORMANCE STATUS: 1 - Symptomatic but completely ambulatory  Filed Vitals:   04/18/15 1309  BP: 151/52  Pulse: 67  Temp: 98 F (36.7 C)  Resp: 18   Filed Weights   04/18/15 1309  Weight: 114 lb 1.6 oz (51.755 kg)    GENERAL:alert, no distress and comfortable. She looks thin and cachectic SKIN: skin color, texture, turgor are normal, no rashes or significant lesions EYES: normal, Conjunctiva are pink and non-injected, sclera clear OROPHARYNX:no exudate, no erythema and lips, buccal mucosa, and tongue normal  NECK: supple, thyroid normal size, non-tender, without nodularity LYMPH:  no palpable lymphadenopathy in the cervical, axillary or  inguinal LUNGS: clear to auscultation and percussion with normal breathing effort HEART: regular rate & rhythm and no murmurs and no lower extremity edema ABDOMEN:abdomen soft, non-tender and normal bowel sounds Musculoskeletal:no cyanosis of digits and no clubbing  NEURO: alert & oriented x 3 with fluent speech, no focal motor/sensory deficits  LABORATORY DATA:  I have reviewed the data as listed    Component Value Date/Time   NA 138 04/11/2015 1340   NA 138 09/21/2014 0725   K 3.8 04/11/2015 1340   K 4.1 09/21/2014 0725   CL 103 09/21/2014 0725   CL 103 11/25/2012 1328   CO2 28 04/11/2015 1340   CO2 30 09/21/2014 0725   GLUCOSE 105 04/11/2015 1340   GLUCOSE 76 09/21/2014 0725   GLUCOSE 91 11/25/2012 1328   BUN 13.9 04/11/2015 1340   BUN 11 09/21/2014 0725   CREATININE 0.7 04/11/2015 1340   CREATININE 0.77 09/21/2014 0725   CALCIUM 8.9 04/11/2015 1340   CALCIUM 8.3* 09/21/2014 0725   PROT 6.1* 04/11/2015 1340   PROT 5.7* 09/21/2014 0725   ALBUMIN 3.5 04/11/2015 1340   ALBUMIN 3.0* 09/21/2014 0725   AST 28 04/11/2015 1340   AST 22 09/21/2014 0725   ALT 24  04/11/2015 1340   ALT 17 09/21/2014 0725   ALKPHOS 67 04/11/2015 1340   ALKPHOS 43 09/21/2014 0725   BILITOT 0.55 04/11/2015 1340   BILITOT 0.7 09/21/2014 0725   GFRNONAA 74* 09/21/2014 0725   GFRAA 86* 09/21/2014 0725    No results found for: SPEP, UPEP  Lab Results  Component Value Date   WBC 2.9* 04/11/2015   NEUTROABS 1.4* 04/11/2015   HGB 10.7* 04/11/2015   HCT 31.6* 04/11/2015   MCV 96.6 04/11/2015   PLT 195 04/11/2015      Chemistry      Component Value Date/Time   NA 138 04/11/2015 1340   NA 138 09/21/2014 0725   K 3.8 04/11/2015 1340   K 4.1 09/21/2014 0725   CL 103 09/21/2014 0725   CL 103 11/25/2012 1328   CO2 28 04/11/2015 1340   CO2 30 09/21/2014 0725   BUN 13.9 04/11/2015 1340   BUN 11 09/21/2014 0725   CREATININE 0.7 04/11/2015 1340   CREATININE 0.77 09/21/2014 0725       Component Value Date/Time   CALCIUM 8.9 04/11/2015 1340   CALCIUM 8.3* 09/21/2014 0725   ALKPHOS 67 04/11/2015 1340   ALKPHOS 43 09/21/2014 0725   AST 28 04/11/2015 1340   AST 22 09/21/2014 0725   ALT 24 04/11/2015 1340   ALT 17 09/21/2014 0725   BILITOT 0.55 04/11/2015 1340   BILITOT 0.7 09/21/2014 0725      ASSESSMENT & PLAN:  Multiple myeloma in remission ([place]) The patient had significant complication related to traumatic compression fracture. Her recent blood work show she is in [place]. The patient have significant deconditioning and continues to have become unmotivated and with weight loss. I recommend stopping treatment and give her a treatment break. I will proceed with Zometa today. I will get the port removed  Pancytopenia due to antineoplastic chemotherapy [facility]) This is likely anemia of chronic disease and Revlimid. The patient denies recent history of bleeding such as epistaxis, hematuria or hematochezia. She is asymptomatic from the anemia. We will observe for now.     Neutropenia ([place]) This is likely due to recent treatment. The patient denies recent history of fevers, cough, chills, diarrhea or dysuria. She is asymptomatic from the leukopenia. I will observe for now.  Neuropathy due to chemotherapeutic drug This is due to previous side effects of Velcade. She is using gabapentin at nighttime.    Depression She is delighted that we will be stopping treatment. She has lost some weight and has been prescribed Remeron. With her anorexia and weight loss, we discussed about other treatment options. She is motivated to try to eat more without assistance from medications.   Orders Placed This Encounter  Procedures  . IR Removal Tun Access W/ Port W/O FL    Standing Status: Future     Number of Occurrences:      Standing Expiration Date: 06/17/2016    Order Specific Question:  Reason for exam:    Answer:  port no longer needed    Order Specific Question:   Preferred Imaging Location?    Answer:  [facility]  . SPEP & IFE with QIG    Standing Status: Future     Number of Occurrences:      Standing Expiration Date: 05/22/2016  . Kappa/lambda light chains    Standing Status: Future     Number of Occurrences:      Standing Expiration Date: 05/22/2016   All questions were answered. The  patient knows to call the clinic with any problems, questions or concerns. No barriers to learning was detected. I spent 25 minutes counseling the patient face to face. The total time spent in the appointment was 30 minutes and more than 50% was on counseling and review of test results     [facility], [place], MD 04/18/2015 2:00 PM

## 2015-04-25 ENCOUNTER — Telehealth: Payer: Self-pay | Admitting: *Deleted

## 2015-04-25 ENCOUNTER — Encounter: Payer: Self-pay | Admitting: *Deleted

## 2015-04-25 NOTE — Telephone Encounter (Signed)
Pt left message stating she has not heard anything about having port removed.  Was not sure if Dr Alvy Bimler changed her mind..Will forward to Dr Alvy Bimler to advise

## 2015-04-28 ENCOUNTER — Other Ambulatory Visit: Payer: Self-pay | Admitting: Hematology and Oncology

## 2015-04-28 NOTE — Telephone Encounter (Signed)
Order was placed for 10/21. Please call IR

## 2015-04-29 ENCOUNTER — Telehealth: Payer: Self-pay | Admitting: *Deleted

## 2015-04-29 NOTE — Telephone Encounter (Signed)
Pt called last week stating he hasn't heard about getting her PAC removed yet.   I called IR and s/w Tiffany who states she has tried to reach pt several times at home number but has not been able to reach pt.. I called pt and gave her the phone number for IR and asked her to call them to schedule her PAC removal.  Dr. Alvy Bimler placed order several weeks ago.  Pt verbalized understanding.

## 2015-04-30 ENCOUNTER — Telehealth: Payer: Self-pay | Admitting: *Deleted

## 2015-04-30 NOTE — Telephone Encounter (Signed)
Revlimid on hold for several months.  Notified [facility] of Medication d/c'd for now.  Will send new Rx if restarted.

## 2015-05-06 ENCOUNTER — Other Ambulatory Visit: Payer: Self-pay | Admitting: *Deleted

## 2015-05-06 DIAGNOSIS — M129 Arthropathy, unspecified: Secondary | ICD-10-CM

## 2015-05-06 DIAGNOSIS — C9000 Multiple myeloma not having achieved remission: Secondary | ICD-10-CM

## 2015-05-06 MED ORDER — TRAMADOL HCL 50 MG PO TABS: 50.0000 mg | ORAL_TABLET | Freq: Four times a day (QID) | ORAL | Status: DC | PRN

## 2015-05-08 ENCOUNTER — Other Ambulatory Visit: Payer: Self-pay | Admitting: Radiology

## 2015-05-09 ENCOUNTER — Encounter (HOSPITAL_COMMUNITY): Payer: Self-pay

## 2015-05-09 ENCOUNTER — Ambulatory Visit (HOSPITAL_COMMUNITY)
Admission: RE | Admit: 2015-05-09 | Discharge: 2015-05-09 | Disposition: A | Discharge status: Home / Self Care | Payer: Medicare Other | Source: Ambulatory Visit | Attending: Hematology and Oncology | Admitting: Hematology and Oncology

## 2015-05-09 ENCOUNTER — Telehealth: Payer: Self-pay | Admitting: *Deleted

## 2015-05-09 ENCOUNTER — Other Ambulatory Visit: Payer: Self-pay | Admitting: *Deleted

## 2015-05-09 DIAGNOSIS — C9001 Multiple myeloma in remission: Secondary | ICD-10-CM | POA: Insufficient documentation

## 2015-05-09 DIAGNOSIS — Z7982 Long term (current) use of aspirin: Secondary | ICD-10-CM | POA: Diagnosis not present

## 2015-05-09 DIAGNOSIS — Z452 Encounter for adjustment and management of vascular access device: Secondary | ICD-10-CM | POA: Diagnosis not present

## 2015-05-09 LAB — CBC WITH DIFFERENTIAL/PLATELET
BASOS ABS: 0 10*3/uL (ref 0.0–0.1)
BASOS PCT: 0 %
EOS ABS: 0 10*3/uL (ref 0.0–0.7)
EOS PCT: 1 %
HCT: 34.6 % — ABNORMAL LOW (ref 36.0–46.0)
Hemoglobin: 11.4 g/dL — ABNORMAL LOW (ref 12.0–15.0)
LYMPHS PCT: 18 %
Lymphs Abs: 0.7 10*3/uL (ref 0.7–4.0)
MCH: 32 pg (ref 26.0–34.0)
MCHC: 32.9 g/dL (ref 30.0–36.0)
MCV: 97.2 fL (ref 78.0–100.0)
Monocytes Absolute: 0.3 10*3/uL (ref 0.1–1.0)
Monocytes Relative: 8 %
NEUTROS ABS: 2.7 10*3/uL (ref 1.7–7.7)
NEUTROS PCT: 73 %
PLATELETS: 223 10*3/uL (ref 150–400)
RBC: 3.56 MIL/uL — AB (ref 3.87–5.11)
RDW: 15.5 % (ref 11.5–15.5)
WBC: 3.8 10*3/uL — AB (ref 4.0–10.5)

## 2015-05-09 LAB — PROTIME-INR
INR: 0.96 (ref 0.00–1.49)
PROTHROMBIN TIME: 13 s (ref 11.6–15.2)

## 2015-05-09 MED ORDER — CEFAZOLIN SODIUM-DEXTROSE 2-3 GM-% IV SOLR
INTRAVENOUS | Status: AC
  Filled 2015-05-09: qty 50

## 2015-05-09 MED ORDER — FENTANYL CITRATE (PF) 100 MCG/2ML IJ SOLN
INTRAMUSCULAR | Status: AC | PRN
  Administered 2015-05-09: 25 ug via INTRAVENOUS

## 2015-05-09 MED ORDER — SODIUM CHLORIDE 0.9 % IV SOLN
INTRAVENOUS | Status: DC
  Administered 2015-05-09: 12:00:00 via INTRAVENOUS

## 2015-05-09 MED ORDER — CEFAZOLIN SODIUM-DEXTROSE 2-3 GM-% IV SOLR: 2.0000 g | Freq: Once | INTRAVENOUS | Status: DC

## 2015-05-09 MED ORDER — HYDROCODONE-ACETAMINOPHEN 5-325 MG PO TABS: 1.0000 | ORAL_TABLET | ORAL | Status: DC | PRN

## 2015-05-09 MED ORDER — MIDAZOLAM HCL 2 MG/2ML IJ SOLN
INTRAMUSCULAR | Status: AC
  Filled 2015-05-09: qty 2

## 2015-05-09 MED ORDER — FENTANYL CITRATE (PF) 100 MCG/2ML IJ SOLN
INTRAMUSCULAR | Status: AC
  Filled 2015-05-09: qty 2

## 2015-05-09 MED ORDER — LIDOCAINE-EPINEPHRINE 2 %-1:100000 IJ SOLN
INTRAMUSCULAR | Status: AC
  Filled 2015-05-09: qty 1

## 2015-05-09 MED ORDER — MIDAZOLAM HCL 2 MG/2ML IJ SOLN
INTRAMUSCULAR | Status: AC | PRN
  Administered 2015-05-09 (×2): 0.5 mg via INTRAVENOUS

## 2015-05-09 NOTE — Progress Notes (Signed)
Patient ID: Natasha Jackson, female   DOB: 1926/02/05, 81 y.o.   MRN: [id number]    Referring Physician(s): Gorsuch,Ni  Chief Complaint:  Multiple myeloma  Subjective: Patient familiar to IR service from prior right chest wall Port-A-Cath placement on 06/26/14 as well as T12 kyphoplasty on 07/31/14. She has history of multiple myeloma, currently in remission and presents today for Port-A-Cath removal. She currently denies fevers, chills, headache, chest pain, cough, dyspnea, abdominal pain, nausea, vomiting or abnormal bleeding. She does have intermittent back pain and sore throat from recent cold.   Allergies: Review of patient's allergies indicates no known allergies.  Medications: Prior to Admission medications   Medication Sig Start Date End Date Taking? Authorizing Provider  Alum & Mag Hydroxide-Simeth (MAGIC MOUTHWASH W/LIDOCAINE) SOLN Take 5 mLs by mouth 3 (three) times daily. 01/21/15  Yes Heath Lark, MD  aspirin 325 MG EC tablet Take 325 mg by mouth daily.   Yes Historical Provider, MD  cholecalciferol (VITAMIN D) 1000 UNITS tablet Take 1,000 Units by mouth daily. 05/01/13  Yes Heath Lark, MD  gabapentin (NEURONTIN) 300 MG capsule TAKE ONE CAPSULE BY MOUTH THREE TIMES DAILY 11/05/14  Yes Heath Lark, MD  levothyroxine (SYNTHROID, LEVOTHROID) 50 MCG tablet Take 50 mcg by mouth daily before breakfast.   Yes Historical Provider, MD  mirtazapine (REMERON) 7.5 MG tablet TAKE ONE TABLET BY MOUTH NIGHTLY AT BEDTIME 03/11/15  Yes Heath Lark, MD  Multiple Vitamin (MULTIVITAMIN WITH MINERALS) TABS tablet Take 1 tablet by mouth daily.   Yes Historical Provider, MD  Polyvinyl Alcohol-Povidone (REFRESH OP) Place 2 drops into both eyes 2 (two) times daily as needed (dry eyes).   Yes Historical Provider, MD  traMADol (ULTRAM) 50 MG tablet Take 1 tablet (50 mg total) by mouth every 6 (six) hours as needed for moderate pain. 05/06/15  Yes Heath Lark, MD  acetaminophen (TYLENOL) 325 MG tablet Take 650 mg by  mouth every 6 (six) hours as needed for mild pain.     Historical Provider, MD  dexamethasone (DECADRON) 4 MG tablet Take 1 tablet every Monday 01/17/15   Heath Lark, MD  diphenhydramine-acetaminophen (TYLENOL PM) 25-500 MG TABS Take 1 tablet by mouth at bedtime as needed (Sleep).     Historical Provider, MD  lidocaine-prilocaine (EMLA) cream Apply 1 application topically as needed. Apply to [facility] a cath site at least one hour prior to [employer]. 06/21/14   Heath Lark, MD  loperamide (IMODIUM A-D) 2 MG tablet Take 2 mg by mouth as needed for diarrhea or loose stools (as directed.).    Historical Provider, MD  PRESCRIPTION MEDICATION Chemo at [facility]    Historical Provider, MD  valsartan (DIOVAN) 160 MG tablet Take 1 tablet (160 mg total) by mouth daily. 11/06/14   Josue Hector, MD     Vital Signs: BP 167/63 mmHg  Pulse 73  Temp(Src) 98.2 F (36.8 C) (Oral)  Resp 18  SpO2 100%  Physical Exam patient is awake, alert. Chest is clear to auscultation bilaterally. Clean intact right chest wall Port-A-Cath. Heart with regular rate and rhythm. Abdomen soft, positive bowel sounds, nontender. Extremities with full range of motion and some trace pretibial edema.  Imaging: No results found.  Labs:  CBC:  Recent Labs  10/18/14 1344 11/22/14 1303 01/10/15 1359 04/11/15 1340  WBC 3.0* 3.3* 3.0* 2.9*  HGB 10.5* 10.9* 9.8* 10.7*  HCT 31.6* 32.8* 29.6* 31.6*  PLT 216 219 189 195    COAGS:  Recent Labs  06/26/14  1250 07/31/14 0807 09/21/14 0725  INR 0.97 1.02 1.02  APTT  --  30  --     BMP:  Recent Labs  08/04/14 0556 08/05/14 0609  09/20/14 1743 09/21/14 0725 10/18/14 1345 11/22/14 1304 01/10/15 1359 04/11/15 1340  NA 137 138  < > 134* 138 138 136 137 138  K 3.6 3.6  < > 3.9 4.1 4.3 4.4 3.8 3.8  CL 104 104  --  99 103  --   --   --   --   CO2 27 27  < > 28 30 25 23 24 28   GLUCOSE 93 95  < > 91 76 109 79 94 105  BUN 5* 5*  < > 16 11 13.0 14.4 13.4 13.9  CALCIUM 7.9*  8.2*  < > 8.5 8.3* 8.2* 8.1* 8.8 8.9  CREATININE 0.59 0.56  < > 0.86 0.77 0.7 0.7 0.7 0.7  GFRNONAA 81* 82*  --  59* 74*  --   --   --   --   GFRAA >90 >90  --  69* 86*  --   --   --   --   < > = values in this interval not displayed.  LIVER FUNCTION TESTS:  Recent Labs  10/18/14 1345 11/22/14 1304 01/10/15 1359 04/11/15 1340  BILITOT 0.36 0.40 0.44 0.55  AST 25 25 19 28   ALT 22 21 19 24   ALKPHOS 57 61 65 67  PROT 6.3* 6.2* 5.8* 6.1*  ALBUMIN 3.4* 3.3* 3.1* 3.5    Assessment and Plan: Patient with history of multiple myeloma, currently in remission and previously placed right IJ Port-A-Cath; she presents today for Port-A-Cath removal. Details/risks of procedure, including but not limited to, internal bleeding, infection discussed with patient and husband with their understanding and consent.   Signed: D. Rowe Robert 05/09/2015, 12:22 PM   I spent a total of 15 minutes at the the patient's bedside AND on the patient's hospital floor or unit, greater than 50% of which was counseling/coordinating care for Port-A-Cath removal

## 2015-05-09 NOTE — Discharge Instructions (Signed)
Incision Care °An incision is when a surgeon cuts into your body. After surgery, the incision needs to be cared for properly to prevent infection.  °HOW TO CARE FOR YOUR INCISION °· Take medicines only as directed by your health care provider. °· There are many different ways to close and cover an incision, including stitches, skin glue, and adhesive strips. Follow your health care provider's instructions on: °¨ Incision care. °¨ Bandage (dressing) changes and removal. °¨ Incision closure removal. °· Do not take baths, swim, or use a hot tub until your health care provider approves. You may shower as directed by your health care provider. °· Resume your normal diet and activities as directed. °· Use anti-itch medicine (such as an antihistamine) as directed by your health care provider. The incision may itch while it is healing. Do not pick or scratch at the incision. °· Drink enough fluid to keep your urine clear or pale yellow. °SEEK MEDICAL CARE IF:  °· You have drainage, redness, swelling, or pain at your incision site. °· You have muscle aches, chills, or a general ill feeling. °· You notice a bad smell coming from the incision or dressing. °· Your incision edges separate after the sutures, staples, or skin adhesive strips have been removed. °· You have persistent nausea or vomiting. °· You have a fever. °· You are dizzy. °SEEK IMMEDIATE MEDICAL CARE IF:  °· You have a rash. °· You faint. °· You have difficulty breathing. °MAKE SURE YOU:  °· Understand these instructions. °· Will watch your condition. °· Will get help right away if you are not doing well or get worse. °  °This information is not intended to replace advice given to you by your health care provider. Make sure you discuss any questions you have with your health care provider. °  °Document Released: 01/09/2005 Document Revised: 07/13/2014 Document Reviewed: 08/16/2013 °Elsevier Interactive Patient Education ©2016 Else[employer] Inc[employer] °Moderate Conscious  Sedation, Adult °Sedation is the use of medicines to promote relaxation and relieve discomfort and anxiety. Moderate conscious sedation is a type of sedation. Under moderate conscious sedation you are less alert than normal but are still able to respond to instructions or stimulation. Moderate conscious sedation is used during short medical and dental procedures. It is milder than deep sedation or general anesthesia and allows you to return to your regular activities sooner. °LET YOUR HEALTH CARE PROVIDER KNOW ABOUT:  °· Any allergies you have. °· All medicines you are taking, including vitamins, herbs, eye drops, creams, and over-the-counter medicines. °· Use of steroids (by mouth or creams). °· Previous problems you or members of your family have had with the use of anesthetics. °· Any blood disorders you have. °· Previous surgeries you have had. °· Medical conditions you have. °· Possibility of pregnancy, if this applies. °· Use of cigarettes, alcohol, or illegal drugs. °RISKS AND COMPLICATIONS °Generally, this is a safe procedure. However, as with any procedure, problems can occur. Possible problems include: °· Oversedation. °· Trouble breathing on your own. You may need to have a breathing tube until you are awake and breathing on your own. °· Allergic reaction to any of the medicines used for the procedure. °BEFORE THE PROCEDURE °· You may have blood tests done. These tests can help show how well your kidneys and liver are working. They can also show how well your blood clots. °· A physical exam will be done.   °· Only take medicines as directed by your health care provider. You may need   to stop taking medicines (such as blood thinners, aspirin, or nonsteroidal anti-inflammatory drugs) before the procedure.   °· Do not eat or drink at least 6 hours before the procedure or as directed by your health care provider. °· Arrange for a responsible adult, family member, or friend to take you home after the procedure.  He or she should stay with you for at least 24 hours after the procedure, until the medicine has worn off. °PROCEDURE  °· An intravenous (IV) catheter will be inserted into one of your veins. Medicine will be able to flow directly into your body through this catheter. You may be given medicine through this tube to help prevent pain and help you relax. °· The medical or dental procedure will be done. °AFTER THE PROCEDURE °· You will stay in a recovery area until the medicine has worn off. Your blood pressure and pulse will be checked.   °·  Depending on the procedure you had, you may be allowed to go home when you can tolerate liquids and your pain is under control. °  °This information is not intended to replace advice given to you by your health care provider. Make sure you discuss any questions you have with your health care provider. °  °Document Released: 03/17/2001 Document Revised: 07/13/2014 Document Reviewed: 02/27/2013 °Elsevier Interactive Patient Education ©2016 Elsevier Inc. ° °

## 2015-05-09 NOTE — Telephone Encounter (Signed)
Patient's daughter Natasha Jackson called asking for "medication for mom's sore raw throat, nasal drip and cold.  She doesn't have a fever yet like I did.  I had a cold ten days ago and have passed this to her.  She's having her port removed this morning.  My doctor gave me Azithromycin.  Can something be called in?  Return number [phone]."

## 2015-05-09 NOTE — Procedures (Signed)
Removal of R IJ Port No complication No blood loss. See complete dictation in [facility].

## 2015-05-10 NOTE — Telephone Encounter (Signed)
Most URI are viral, not bacterial Start with OTC medications first and schedule appt to PCP Antibiotics cannot be called in

## 2015-05-10 NOTE — Telephone Encounter (Signed)
Daughter notified of message below 

## 2015-06-11 ENCOUNTER — Telehealth: Payer: Self-pay | Admitting: *Deleted

## 2015-06-11 NOTE — Telephone Encounter (Signed)
Pt left VM asking if ok w/ Dr. Alvy Bimler for her to go to a "public pool" for water aerobics?  Her Orthopedic MD has suggested water aerobics.   Called pt back and left VM informing her it is ok w/ Dr. Alvy Bimler for pt to go to public pool for water aerobics.  She thinks it is a good idea.  Please call back if any further questions.

## 2015-06-12 ENCOUNTER — Telehealth: Payer: Self-pay | Admitting: *Deleted

## 2015-06-12 DIAGNOSIS — C9000 Multiple myeloma not having achieved remission: Secondary | ICD-10-CM

## 2015-06-12 DIAGNOSIS — M129 Arthropathy, unspecified: Secondary | ICD-10-CM

## 2015-06-12 MED ORDER — TRAMADOL HCL 50 MG PO TABS: 50.0000 mg | ORAL_TABLET | Freq: Four times a day (QID) | ORAL | Status: DC | PRN

## 2015-06-12 NOTE — Telephone Encounter (Signed)
Pt left VM requesting refill on Tramadol.   Informed her of Rx ready to pick up at our office.   She verbalized understanding.

## 2015-06-25 ENCOUNTER — Telehealth: Payer: Self-pay | Admitting: *Deleted

## 2015-06-25 ENCOUNTER — Other Ambulatory Visit: Payer: Self-pay | Admitting: Hematology and Oncology

## 2015-06-25 NOTE — Telephone Encounter (Signed)
Pt asks if ok w/ Dr. Alvy Bimler to stop Remeron?  She says it has not helped her gain any weight at all and she feels like it is making her tired during the day.   Instructed pt ok to stop if she feels it is not helping and she is having side effects.   Discussed gradual taper to avoid withdrawal symptoms.  Pt will take 1/2 tablet for several nights, then every other night for one week before stopping completely.  Instructed pt to call if any problems.  She verbalized understanding.

## 2015-07-04 ENCOUNTER — Other Ambulatory Visit: Payer: Self-pay | Admitting: Hematology and Oncology

## 2015-07-18 ENCOUNTER — Telehealth: Payer: Self-pay | Admitting: Cardiovascular Disease

## 2015-07-18 NOTE — Telephone Encounter (Signed)
Patient states she is feeling great except for some dizziness that has been going on for several days. Patient stated she has been taking Tramadol and it makes her tired when she takes it. I informed patient that Tramadol might be causing her dizziness, and to follow up with her PCP, since this is her only symptom and she feels fine other than that. Instructed patient to be careful when changing positions. Patient also is due for a follow-up  appointment with Dr. Johnsie Cancel. Will send message to scheduling to get patient an appointment.

## 2015-07-18 NOTE — Telephone Encounter (Signed)
New message   Pt called c/o dizziness. She states that she has a leaking heart valve so she is highly concerned. She believes that it maybne due to the vertigo but sue is not sure. Request a same day appt.

## 2015-08-09 ENCOUNTER — Telehealth: Payer: Self-pay | Admitting: *Deleted

## 2015-08-09 DIAGNOSIS — C9000 Multiple myeloma not having achieved remission: Secondary | ICD-10-CM

## 2015-08-09 DIAGNOSIS — M129 Arthropathy, unspecified: Secondary | ICD-10-CM

## 2015-08-09 MED ORDER — TRAMADOL HCL 50 MG PO TABS: 50.0000 mg | ORAL_TABLET | Freq: Four times a day (QID) | ORAL | Status: DC | PRN

## 2015-08-09 NOTE — Telephone Encounter (Signed)
Pt requests refill on Tramadol.  She is taking two to three per day for back pain.

## 2015-08-09 NOTE — Telephone Encounter (Signed)
Informed pt of refill called into [facility] on [address].  She verbalized understanding.

## 2015-08-09 NOTE — Telephone Encounter (Signed)
pls refill 90 tabs 

## 2015-08-30 ENCOUNTER — Ambulatory Visit: Payer: Medicare Other | Admitting: Cardiovascular Disease

## 2015-09-02 ENCOUNTER — Telehealth: Payer: Self-pay | Admitting: *Deleted

## 2015-09-02 DIAGNOSIS — M129 Arthropathy, unspecified: Secondary | ICD-10-CM

## 2015-09-02 DIAGNOSIS — C9000 Multiple myeloma not having achieved remission: Secondary | ICD-10-CM

## 2015-09-02 MED ORDER — TRAMADOL HCL 50 MG PO TABS: 50.0000 mg | ORAL_TABLET | Freq: Four times a day (QID) | ORAL | Status: DC | PRN

## 2015-09-02 NOTE — Telephone Encounter (Signed)
Pt requesting refill on Tramadol called into her pharmacy.  Ok to refill?

## 2015-09-02 NOTE — Telephone Encounter (Signed)
Notified pt of Refill called into her pharmacy.

## 2015-09-02 NOTE — Telephone Encounter (Signed)
Pls refill

## 2015-09-18 ENCOUNTER — Other Ambulatory Visit: Payer: Self-pay | Admitting: Hematology and Oncology

## 2015-09-19 ENCOUNTER — Other Ambulatory Visit: Payer: Self-pay | Admitting: *Deleted

## 2015-09-19 ENCOUNTER — Other Ambulatory Visit (HOSPITAL_BASED_OUTPATIENT_CLINIC_OR_DEPARTMENT_OTHER): Payer: Medicare Other

## 2015-09-19 DIAGNOSIS — C9001 Multiple myeloma in remission: Secondary | ICD-10-CM | POA: Diagnosis present

## 2015-09-19 LAB — COMPREHENSIVE METABOLIC PANEL
ALT: 21 U/L (ref 0–55)
AST: 31 U/L (ref 5–34)
Albumin: 3.7 g/dL (ref 3.5–5.0)
Alkaline Phosphatase: 69 U/L (ref 40–150)
Anion Gap: 6 mEq/L (ref 3–11)
BUN: 20.2 mg/dL (ref 7.0–26.0)
CALCIUM: 9.3 mg/dL (ref 8.4–10.4)
CHLORIDE: 102 meq/L (ref 98–109)
CO2: 28 meq/L (ref 22–29)
CREATININE: 1 mg/dL (ref 0.6–1.1)
EGFR: 54 mL/min/{1.73_m2} — ABNORMAL LOW (ref >90)
GLUCOSE: 73 mg/dL (ref 70–140)
Potassium: 4.6 mEq/L (ref 3.5–5.1)
SODIUM: 137 meq/L (ref 136–145)
Total Bilirubin: 0.38 mg/dL (ref 0.20–1.20)
Total Protein: 7.7 g/dL (ref 6.4–8.3)

## 2015-09-19 LAB — CBC WITH DIFFERENTIAL/PLATELET
BASO%: 0.3 % (ref 0.0–2.0)
Basophils Absolute: 0 10*3/uL (ref 0.0–0.1)
EOS%: 0.3 % (ref 0.0–7.0)
Eosinophils Absolute: 0 10*3/uL (ref 0.0–0.5)
HEMATOCRIT: 33.4 % — AB (ref 34.8–46.6)
HGB: 11.2 g/dL — ABNORMAL LOW (ref 11.6–15.9)
LYMPH#: 1.6 10*3/uL (ref 0.9–3.3)
LYMPH%: 49.5 % (ref 14.0–49.7)
MCH: 33.6 pg (ref 25.1–34.0)
MCHC: 33.5 g/dL (ref 31.5–36.0)
MCV: 100.3 fL (ref 79.5–101.0)
MONO#: 0.2 10*3/uL (ref 0.1–0.9)
MONO%: 6.8 % (ref 0.0–14.0)
NEUT#: 1.4 10*3/uL — ABNORMAL LOW (ref 1.5–6.5)
NEUT%: 43.1 % (ref 38.4–76.8)
Platelets: 180 10*3/uL (ref 145–400)
RBC: 3.33 10*6/uL — AB (ref 3.70–5.45)
RDW: 14.8 % — ABNORMAL HIGH (ref 11.2–14.5)
WBC: 3.3 10*3/uL — ABNORMAL LOW (ref 3.9–10.3)

## 2015-09-20 LAB — KAPPA/LAMBDA LIGHT CHAINS
IG KAPPA FREE LIGHT CHAIN: 203.92 mg/L — AB (ref 3.30–19.40)
Ig Lambda Free Light Chain: 9.46 mg/L (ref 5.71–26.30)
KAPPA/LAMBDA FLC RATIO: 21.56 — AB (ref 0.26–1.65)

## 2015-09-23 ENCOUNTER — Telehealth: Payer: Self-pay | Admitting: Hematology and Oncology

## 2015-09-23 NOTE — Telephone Encounter (Signed)
s.w. pt and r/s appt to later time per MD request....pt ok and aware

## 2015-09-24 ENCOUNTER — Other Ambulatory Visit: Payer: Self-pay | Admitting: Hematology and Oncology

## 2015-09-24 LAB — MULTIPLE MYELOMA PANEL, SERUM
ALBUMIN/GLOB SERPL: 1.2 (ref 0.7–1.7)
ALPHA 1: 0.2 g/dL (ref 0.0–0.4)
ALPHA2 GLOB SERPL ELPH-MCNC: 0.6 g/dL (ref 0.4–1.0)
Albumin SerPl Elph-Mcnc: 3.8 g/dL (ref 2.9–4.4)
B-Globulin SerPl Elph-Mcnc: 0.8 g/dL (ref 0.7–1.3)
GAMMA GLOB SERPL ELPH-MCNC: 1.8 g/dL (ref 0.4–1.8)
GLOBULIN, TOTAL: 3.4 g/dL (ref 2.2–3.9)
IgA, Qn, Serum: 30 mg/dL — ABNORMAL LOW (ref 64–422)
IgM, Qn, Serum: 51 mg/dL (ref 26–217)
M Protein SerPl Elph-Mcnc: 1.6 g/dL — ABNORMAL HIGH
Total Protein: 7.2 g/dL (ref 6.0–8.5)

## 2015-09-26 ENCOUNTER — Ambulatory Visit: Payer: Medicare Other

## 2015-09-26 ENCOUNTER — Ambulatory Visit: Payer: Medicare Other | Admitting: Hematology and Oncology

## 2015-09-26 ENCOUNTER — Ambulatory Visit (HOSPITAL_BASED_OUTPATIENT_CLINIC_OR_DEPARTMENT_OTHER): Payer: Medicare Other

## 2015-09-26 ENCOUNTER — Encounter: Payer: Self-pay | Admitting: Hematology and Oncology

## 2015-09-26 ENCOUNTER — Other Ambulatory Visit: Payer: Self-pay | Admitting: *Deleted

## 2015-09-26 ENCOUNTER — Telehealth: Payer: Self-pay | Admitting: Hematology and Oncology

## 2015-09-26 ENCOUNTER — Ambulatory Visit (HOSPITAL_BASED_OUTPATIENT_CLINIC_OR_DEPARTMENT_OTHER): Payer: Medicare Other | Admitting: Hematology and Oncology

## 2015-09-26 VITALS — BP 138/54 | HR 67 | Temp 97.7°F | Resp 18 | Ht 65.0 in | Wt 118.8 lb

## 2015-09-26 DIAGNOSIS — M129 Arthropathy, unspecified: Secondary | ICD-10-CM

## 2015-09-26 DIAGNOSIS — M4850XS Collapsed vertebra, not elsewhere classified, site unspecified, sequela of fracture: Secondary | ICD-10-CM

## 2015-09-26 DIAGNOSIS — M549 Dorsalgia, unspecified: Secondary | ICD-10-CM | POA: Diagnosis not present

## 2015-09-26 DIAGNOSIS — IMO0001 Reserved for inherently not codable concepts without codable children: Secondary | ICD-10-CM

## 2015-09-26 DIAGNOSIS — C9001 Multiple myeloma in remission: Secondary | ICD-10-CM | POA: Diagnosis present

## 2015-09-26 DIAGNOSIS — M4850XD Collapsed vertebra, not elsewhere classified, site unspecified, subsequent encounter for fracture with routine healing: Principal | ICD-10-CM

## 2015-09-26 DIAGNOSIS — C9000 Multiple myeloma not having achieved remission: Secondary | ICD-10-CM

## 2015-09-26 DIAGNOSIS — G8929 Other chronic pain: Secondary | ICD-10-CM | POA: Diagnosis not present

## 2015-09-26 MED ORDER — TRAMADOL HCL 50 MG PO TABS: 50.0000 mg | ORAL_TABLET | Freq: Four times a day (QID) | ORAL | Status: DC | PRN

## 2015-09-26 MED ORDER — ZOLEDRONIC ACID 4 MG/5ML IV CONC
3.3000 mg | Freq: Once | INTRAVENOUS | Status: AC
  Administered 2015-09-26: 3.3 mg via INTRAVENOUS
  Filled 2015-09-26: qty 4.13

## 2015-09-26 MED ORDER — SODIUM CHLORIDE 0.9 % IV SOLN
Freq: Once | INTRAVENOUS | Status: AC
  Administered 2015-09-26: 16:00:00 via INTRAVENOUS

## 2015-09-26 NOTE — Assessment & Plan Note (Signed)
The patient had significant complication related to traumatic compression fracture. Her recent blood work show she has early signs of relapse but not to the degree that would require treatment I will proceed with Zometa today. Continue supportive care with pain management, calcium with vitamin D

## 2015-09-26 NOTE — Progress Notes (Signed)
[facility] OFFICE PROGRESS NOTE  Patient Care Team: Thressa Sheller, MD as PCP - General (Internal Medicine) Thressa Sheller, MD (Internal Medicine) Renella Cunas, MD as Attending Physician (Cardiology) Heath Lark, MD as Consulting Physician (Hematology and Oncology) Luanne Bras, MD as Consulting Physician (Interventional Radiology)  SUMMARY OF ONCOLOGIC HISTORY: Oncology History   The     Multiple myeloma in remission [facility])   11/12/2008 Imaging Skeletal survey showed lytic lesion in the right femur compatible with  myeloma. There were Questionable skull lesions   11/15/2008 Bone Marrow Biopsy BONE MARROW ASPIRATE AND BIOPSY: showed NORMOCELLULAR MARROW FOR AGE WITH PLASMA CELL DYSCRASIA  (PLASMA CELLS 25%). 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 Subsequently she was placed on Revlimid & Dexamethasone alone   05/12/2013 Bone Marrow Biopsy Bone marrow biopsy showed persistent plasma cells. Blood work confirmed VGPR status   06/11/2014 Tumor Marker Bloodwork show disease relapse   06/26/2014 Procedure She has placement of port   07/05/2014 - 07/26/2014 Chemotherapy She received Elotuzumab and Revlimid. Rx was discontinued with elotuzumab per patient preference   08/01/2014 - 08/05/2014 Hospital Admission She was admitted to the hospital for kyphoplasty and pain management after traumatic compression fracture   08/23/2014 - 04/18/2015 Chemotherapy She was placed on dexamethasone & Revlimid   04/11/2015 Tumor Marker Blood work showed VGPR. Treatment was discontinued and she is maintained on Zometa only    INTERVAL HISTORY: Please see below for problem oriented charting. She is doing well apart from persistent back pain, worse in the evening, aggravated by activity. Without her pain medication, she would rate her pain at 8 out  of 10. Tramadol seems to help. She complained of pain mainly located in the paravertebral area, relieved with massages and rest. She denies recent infection.  REVIEW OF SYSTEMS:   Constitutional: Denies fevers, chills or abnormal weight loss Eyes: Denies blurriness of vision Ears, nose, mouth, throat, and face: Denies mucositis or sore throat Respiratory: Denies cough, dyspnea or wheezes Cardiovascular: Denies palpitation, chest discomfort or lower extremity swelling Gastrointestinal:  Denies nausea, heartburn or change in bowel habits Skin: Denies abnormal skin rashes Lymphatics: Denies new lymphadenopathy or easy bruising Neurological:Denies numbness, tingling or new weaknesses Behavioral/Psych: Mood is stable, no new changes  All other systems were reviewed with the patient and are negative.  I have reviewed the past medical history, past surgical history, social history and family history with the patient and they are unchanged from previous note.  ALLERGIES:  has No Known Allergies.  MEDICATIONS:  Current Outpatient Prescriptions  Medication Sig Dispense Refill  . acetaminophen (TYLENOL) 325 MG tablet Take 650 mg by mouth every 6 (six) hours as needed for mild pain.     [name] Alum & Mag Hydroxide-Simeth (MAGIC MOUTHWASH W/LIDOCAINE) SOLN Take 5 mLs by mouth 3 (three) times daily. 200 mL 0  . aspirin 325 MG EC tablet Take 325 mg by mouth daily.    . cholecalciferol (VITAMIN D) 1000 UNITS tablet Take 1,000 Units by mouth daily.    . diphenhydramine-acetaminophen (TYLENOL PM) 25-500 MG TABS Take 1 tablet by mouth at bedtime as needed (Sleep).     . gabapentin (NEURONTIN) 300 MG capsule TAKE ONE CAPSULE BY MOUTH THREE TIMES DAILY 90 capsule 3  . levothyroxine (SYNTHROID, LEVOTHROID) 50 MCG tablet Take 50 mcg by mouth daily before breakfast.    . loperamide (IMODIUM A-D) 2  MG tablet Take 2 mg by mouth as needed for diarrhea or loose stools (as directed.).    [name] mirtazapine (REMERON) 7.5 MG  tablet TAKE ONE TABLET BY MOUTH NIGHTLY AT BEDTIME 30 tablet 3  . Multiple Vitamin (MULTIVITAMIN WITH MINERALS) TABS tablet Take 1 tablet by mouth daily.    . Polyvinyl Alcohol-Povidone (REFRESH OP) Place 2 drops into both eyes 2 (two) times daily as needed (dry eyes).    [name] PRESCRIPTION MEDICATION Chemo at [facility]    . traMADol (ULTRAM) 50 MG tablet Take 1 tablet (50 mg total) by mouth every 6 (six) hours as needed for moderate pain. 90 tablet 0  . valsartan (DIOVAN) 160 MG tablet Take 1 tablet (160 mg total) by mouth daily. 30 tablet 3   No current facility-administered medications for this visit.   Facility-Administered Medications Ordered in Other Visits  Medication Dose Route Frequency Provider Last Rate Last Dose  . 0.9 %  sodium chloride infusion   Intravenous Once Heath Lark, MD      . zolendronic acid (ZOMETA) 3.3 mg in sodium chloride 0.9 % 100 mL IVPB  3.3 mg Intravenous Once Heath Lark, MD        PHYSICAL EXAMINATION: ECOG PERFORMANCE STATUS: 1 - Symptomatic but completely ambulatory  Filed Vitals:   09/26/15 1426  BP: 138/54  Pulse: 67  Temp: 97.7 F (36.5 C)  Resp: 18   Filed Weights   09/26/15 1426  Weight: 118 lb 12.8 oz (53.887 kg)    GENERAL:alert, no distress and comfortable. She looks thin and cachectic SKIN: skin color, texture, turgor are normal, no rashes or significant lesions EYES: normal, Conjunctiva are pink and non-injected, sclera clear OROPHARYNX:no exudate, no erythema and lips, buccal mucosa, and tongue normal  Musculoskeletal:no cyanosis of digits and no clubbing . She has no pain on palpation of her entire spine NEURO: alert & oriented x 3 with fluent speech, no focal motor/sensory deficits  LABORATORY DATA:  I have reviewed the data as listed    Component Value Date/Time   NA 137 09/19/2015 1547   NA 138 09/21/2014 0725   K 4.6 09/19/2015 1547   K 4.1 09/21/2014 0725   CL 103 09/21/2014 0725   CL 103 11/25/2012 1328   CO2 28 09/19/2015  1547   CO2 30 09/21/2014 0725   GLUCOSE 73 09/19/2015 1547   GLUCOSE 76 09/21/2014 0725   GLUCOSE 91 11/25/2012 1328   BUN 20.2 09/19/2015 1547   BUN 11 09/21/2014 0725   CREATININE 1.0 09/19/2015 1547   CREATININE 0.77 09/21/2014 0725   CALCIUM 9.3 09/19/2015 1547   CALCIUM 8.3* 09/21/2014 0725   PROT 7.7 09/19/2015 1547   PROT 7.2 09/19/2015 1540   PROT 5.7* 09/21/2014 0725   ALBUMIN 3.7 09/19/2015 1547   ALBUMIN 3.0* 09/21/2014 0725   AST 31 09/19/2015 1547   AST 22 09/21/2014 0725   ALT 21 09/19/2015 1547   ALT 17 09/21/2014 0725   ALKPHOS 69 09/19/2015 1547   ALKPHOS 43 09/21/2014 0725   BILITOT 0.38 09/19/2015 1547   BILITOT 0.7 09/21/2014 0725   GFRNONAA 74* 09/21/2014 0725   GFRAA 86* 09/21/2014 0725    No results found for: SPEP, UPEP  Lab Results  Component Value Date   WBC 3.3* 09/19/2015   NEUTROABS 1.4* 09/19/2015   HGB 11.2* 09/19/2015   HCT 33.4* 09/19/2015   MCV 100.3 09/19/2015   PLT 180 09/19/2015      Chemistry  Component Value Date/Time   NA 137 09/19/2015 1547   NA 138 09/21/2014 0725   K 4.6 09/19/2015 1547   K 4.1 09/21/2014 0725   CL 103 09/21/2014 0725   CL 103 11/25/2012 1328   CO2 28 09/19/2015 1547   CO2 30 09/21/2014 0725   BUN 20.2 09/19/2015 1547   BUN 11 09/21/2014 0725   CREATININE 1.0 09/19/2015 1547   CREATININE 0.77 09/21/2014 0725      Component Value Date/Time   CALCIUM 9.3 09/19/2015 1547   CALCIUM 8.3* 09/21/2014 0725   ALKPHOS 69 09/19/2015 1547   ALKPHOS 43 09/21/2014 0725   AST 31 09/19/2015 1547   AST 22 09/21/2014 0725   ALT 21 09/19/2015 1547   ALT 17 09/21/2014 0725   BILITOT 0.38 09/19/2015 1547   BILITOT 0.7 09/21/2014 0725      ASSESSMENT & PLAN:  Multiple myeloma in remission ([place]) The patient had significant complication related to traumatic compression fracture. Her recent blood work show she has early signs of relapse but not to the degree that would require treatment I will proceed  with Zometa today. Continue supportive care with pain management, calcium with vitamin D   Chronic back pain greater than 3 months duration She has chronic back pain and muscular spasm as a consequence of prior traumatic fracture. I recommend continue on tramadol, wearing a brace and referral to physical therapy I will order skeletal survey before she comes back in her next visit in 3 months  Vertebral compression fracture She had severe pain but was unable to tolerate narcotics due to severe constipation. She is doing well with tramadol. I refilled her prescription today. As above, I recommend we proceed with Zometa and referral to physical therapy.   Orders Placed This Encounter  Procedures  . DG Bone Survey Met    Standing Status: Future     Number of Occurrences:      Standing Expiration Date: 11/25/2016    Order Specific Question:  Reason for Exam (SYMPTOM  OR DIAGNOSIS REQUIRED)    Answer:  myeloma staging    Order Specific Question:  Preferred imaging location?    Answer:  [facility]  . CBC with Differential/Platelet    Standing Status: Future     Number of Occurrences:      Standing Expiration Date: 11/25/2016  . Comprehensive metabolic panel    Standing Status: Future     Number of Occurrences:      Standing Expiration Date: 11/25/2016  . Multiple Myeloma Panel (SPEP&IFE w/QIG)    Standing Status: Future     Number of Occurrences:      Standing Expiration Date: 11/25/2016  . Kappa/lambda light chains    Standing Status: Future     Number of Occurrences:      Standing Expiration Date: 11/25/2016   All questions were answered. The patient knows to call the clinic with any problems, questions or concerns. No barriers to learning was detected. I spent 20 minutes counseling the patient face to face. The total time spent in the appointment was 25 minutes and more than 50% was on counseling and review of test results     [facility], [place], MD 09/26/2015 3:56 PM

## 2015-09-26 NOTE — Assessment & Plan Note (Signed)
She had severe pain but was unable to tolerate narcotics due to severe constipation. She is doing well with tramadol. I refilled her prescription today. As above, I recommend we proceed with Zometa and referral to physical therapy.

## 2015-09-26 NOTE — Telephone Encounter (Signed)
per pof to sch pt appt-gave pt copy of avs-adv [facility] will call to sch trmt °

## 2015-09-26 NOTE — Patient Instructions (Signed)

## 2015-09-26 NOTE — Assessment & Plan Note (Signed)
She has chronic back pain and muscular spasm as a consequence of prior traumatic fracture. I recommend continue on tramadol, wearing a brace and referral to physical therapy I will order skeletal survey before she comes back in her next visit in 3 months

## 2015-09-27 ENCOUNTER — Telehealth: Payer: Self-pay | Admitting: *Deleted

## 2015-09-27 NOTE — Telephone Encounter (Signed)
[facility] and s/w Varney Biles. She says they received the referral and will contact pt for appt.[name]

## 2015-09-28 NOTE — Progress Notes (Signed)
Patient ID: Natasha Jackson, female   DOB: 17-Dec-1925, 82 y.o.   MRN: [id number] HPI: pleasant female followed by Dr Stanford Breed  On  09/22/12 seen for evaluation of bradycardia; Has h/o aortic insufficiency and hypertension. Last echocardiogram in July of 2013 showed an ejection fraction of 45-50% and moderate aortic insufficiency. There was mild mitral regurgitation and mild left atrial enlargement. Patient denies dyspnea on exertion, orthopnea, PND, pedal edema, palpitations or syncope. Yesterday she turned her head and had transient dizziness for 10 seconds not associated with chest pain, palpitations and no syncope. Currently being Rx for myeloma with revlimid and decadron. Bothered most by neuropathy in feet   F/U event monitor showed SR with PAC;s and PVC;s mean HR 62 with min rate early in am 46 with sinus bradycaria no AV block  Primary issues are arthritis in back and hands and hot flashes  Just had ruptured disc that is still painful.  Gets nausea with tramadol T12 lesion likley from myeloma  Seen in hospital by Dr Mare Ferrari 09/19/14  For atypical chest pain after her fracture  Myovue normal reviewed EF 64%    09/21/14  myovue normal EF 64%  10/13/13  Echo mild AR Ef 40-45%  Study Conclusions  - Left ventricle: The cavity size was normal. Systolic function was mildly to moderately reduced. The estimated ejection fraction was in the range of 40% to 45%. Diffuse hypokinesis. There was an increased relative contribution of atrial contraction to ventricular filling. Doppler parameters are consistent with abnormal left ventricular relaxation (grade 1 diastolic dysfunction). - Ventricular septum: Septal motion showed paradox. - Aortic valve: Mild regurgitation. - Mitral valve: Mild to moderate regurgitation. - Atrial septum: There was increased thickness of the  ROS: Denies fever, malais, weight loss, blurry vision, decreased visual acuity, cough, sputum, SOB, hemoptysis, pleuritic  pain, palpitaitons, heartburn, abdominal pain, melena, lower extremity edema, claudication, or rash.  All other systems reviewed and negative  General: Affect appropriate Healthy:  appears stated age 30: normal Neck supple with no adenopathy JVP normal no bruits no thyromegaly Lungs clear with no wheezing and good diaphragmatic motion Heart:  S1/S2 AS/AR  murmur, no rub, gallop or click PMI normal Abdomen: benighn, BS positve, no tenderness, no AAA no bruit.  No HSM or HJR Distal pulses intact with no bruits No edema Neuro non-focal Skin warm and dry No muscular weakness   Current Outpatient Prescriptions  Medication Sig Dispense Refill  . acetaminophen (TYLENOL) 325 MG tablet Take 650 mg by mouth every 6 (six) hours as needed for mild pain.     [name] Alum & Mag Hydroxide-Simeth (MAGIC MOUTHWASH W/LIDOCAINE) SOLN Take 5 mLs by mouth 3 (three) times daily. 200 mL 0  . aspirin 325 MG EC tablet Take 325 mg by mouth daily.    . cholecalciferol (VITAMIN D) 1000 UNITS tablet Take 1,000 Units by mouth daily.    . diphenhydramine-acetaminophen (TYLENOL PM) 25-500 MG TABS Take 1 tablet by mouth at bedtime as needed (Sleep).     . gabapentin (NEURONTIN) 300 MG capsule TAKE ONE CAPSULE BY MOUTH THREE TIMES DAILY 90 capsule 3  . levothyroxine (SYNTHROID, LEVOTHROID) 50 MCG tablet Take 50 mcg by mouth daily before breakfast.    . loperamide (IMODIUM A-D) 2 MG tablet Take 2 mg by mouth as needed for diarrhea or loose stools (as directed.).    [name] mirtazapine (REMERON) 7.5 MG tablet TAKE ONE TABLET BY MOUTH NIGHTLY AT BEDTIME 30 tablet 3  . Multiple Vitamin (MULTIVITAMIN WITH  MINERALS) TABS tablet Take 1 tablet by mouth daily.    . Polyvinyl Alcohol-Povidone (REFRESH OP) Place 2 drops into both eyes 2 (two) times daily as needed (dry eyes).    [name] PRESCRIPTION MEDICATION Chemo at [facility]    . traMADol (ULTRAM) 50 MG tablet Take 1 tablet (50 mg total) by mouth every 6 (six) hours as needed for moderate  pain. 90 tablet 0  . valsartan (DIOVAN) 160 MG tablet Take 1 tablet (160 mg total) by mouth daily. 30 tablet 3   No current facility-administered medications for this visit.    Allergies  Review of patient's allergies indicates no known allergies.  Electrocardiogram:   09/2014  SR 57 LVVV Limb lead reversal new since 3/14  LBBB old   Assessment and Plan LBBB: Valve Disease: Dizziness: Bradycardia HTN: Myeloma Chest Pain    Jenkins Rouge  This encounter was created in error - please disregard.

## 2015-09-30 ENCOUNTER — Telehealth: Payer: Self-pay | Admitting: *Deleted

## 2015-09-30 ENCOUNTER — Telehealth: Payer: Self-pay | Admitting: Hematology and Oncology

## 2015-09-30 NOTE — Telephone Encounter (Signed)
Pt called to ask about her schedule.   She noticed she is scheduled for lab same day she sees Dr. Alvy Bimler on 6/22.  Says she thinks Dr. Alvy Bimler wants labs one week prior to MD visit.  Informed pt she is correct and Dr. Calton Dach order also reflects she wants labs done on 6/15 (along w/ Zometa and Xray).   Informed pt I will send message to Scheduler to correct.   Also informed her she should be getting a call from [facility] for PHT.  I s/w them on Friday and they said they will contact her w/ appt.  Pt verbalized understanding.

## 2015-09-30 NOTE — Telephone Encounter (Signed)
moved labs to 6/15 per pof

## 2015-09-30 NOTE — Telephone Encounter (Signed)
Per staff message and POF I have scheduled appts. Advised scheduler of appts. JMW  

## 2015-10-01 ENCOUNTER — Encounter: Payer: Medicare Other | Admitting: Cardiovascular Disease

## 2015-10-02 ENCOUNTER — Other Ambulatory Visit: Payer: Self-pay | Admitting: *Deleted

## 2015-10-03 ENCOUNTER — Telehealth: Payer: Self-pay | Admitting: Hematology and Oncology

## 2015-10-03 NOTE — Telephone Encounter (Signed)
s.w. pt and advised on April appt.....pt ok and aware °

## 2015-10-11 ENCOUNTER — Other Ambulatory Visit: Payer: Self-pay | Admitting: Hematology and Oncology

## 2015-10-11 ENCOUNTER — Encounter: Payer: Self-pay | Admitting: Hematology and Oncology

## 2015-10-11 ENCOUNTER — Telehealth: Payer: Self-pay | Admitting: *Deleted

## 2015-10-11 DIAGNOSIS — M4850XS Collapsed vertebra, not elsewhere classified, site unspecified, sequela of fracture: Principal | ICD-10-CM

## 2015-10-11 DIAGNOSIS — G8929 Other chronic pain: Secondary | ICD-10-CM

## 2015-10-11 DIAGNOSIS — C9000 Multiple myeloma not having achieved remission: Secondary | ICD-10-CM

## 2015-10-11 DIAGNOSIS — IMO0001 Reserved for inherently not codable concepts without codable children: Secondary | ICD-10-CM

## 2015-10-11 DIAGNOSIS — M549 Dorsalgia, unspecified: Principal | ICD-10-CM

## 2015-10-11 HISTORY — DX: Multiple myeloma not having achieved remission: C90.00

## 2015-10-11 MED ORDER — OXYCODONE-ACETAMINOPHEN 5-325 MG PO TABS: 2.0000 | ORAL_TABLET | Freq: Four times a day (QID) | ORAL | Status: DC | PRN

## 2015-10-11 NOTE — Telephone Encounter (Signed)
Pt LVM reports pain in Left Ribs has gotten so bad over the past few days that "I can't function" and "So bad I can't stand it."   Tramadol does not "touch the pain."  She asks if she should be referred to a Pain Clinic?

## 2015-10-11 NOTE — Telephone Encounter (Signed)
Informed pt Dr. Alvy Bimler has ordered MRI of spine to be done next week w/ MD appt the next day.  Radiology Scheduler should be contacting her w/ appt.  Meanwhile informed pt New Rx for Percocet which is stronger than Tramadol is ready to pick up.  Pt says her daughter will come pick up Rx today.  Also instructed pt to rest,  Avoid exacerbating back pain.   Pt verbalized understanding.

## 2015-10-14 ENCOUNTER — Telehealth: Payer: Self-pay | Admitting: Hematology and Oncology

## 2015-10-14 ENCOUNTER — Other Ambulatory Visit: Payer: Self-pay | Admitting: Hematology and Oncology

## 2015-10-14 NOTE — Telephone Encounter (Signed)
s.w. pt and apt advised that she is clostrophobic....mri cx at [facility]....GI is requesting that appt be put in individually...MD requests that GI radiologist contact her...i will contact pt once problem fixed

## 2015-10-16 ENCOUNTER — Other Ambulatory Visit: Payer: Self-pay | Admitting: Hematology and Oncology

## 2015-10-16 DIAGNOSIS — IMO0001 Reserved for inherently not codable concepts without codable children: Secondary | ICD-10-CM

## 2015-10-16 DIAGNOSIS — M4850XS Collapsed vertebra, not elsewhere classified, site unspecified, sequela of fracture: Secondary | ICD-10-CM

## 2015-10-16 DIAGNOSIS — C9001 Multiple myeloma in remission: Secondary | ICD-10-CM

## 2015-10-17 ENCOUNTER — Telehealth: Payer: Self-pay | Admitting: Cardiovascular Disease

## 2015-10-17 ENCOUNTER — Ambulatory Visit: Payer: Medicare Other | Admitting: Hematology and Oncology

## 2015-10-17 NOTE — Telephone Encounter (Signed)
New Message:  Pt called in wanting to speak with a nurse about 2 recent episodes of her being very dizzy. She wants to know if this is a side effect from her leaky heart valve or could it possibly be something else. Please f/u with her

## 2015-10-17 NOTE — Telephone Encounter (Signed)
Follow up  Bp is 164/67. Please call back to discuss

## 2015-10-17 NOTE — Telephone Encounter (Signed)
Called patient about her message below. Patient is complaining of frequent dizziness. Patient stated she stopped taking her valsartan because she thought it was causing her dizziness. Patient instructed to take her BP and HR, and then call back with data. Made appointment with Nell Range PA, due to patient's stopping her valsartan, episodes of dizziness, and patient has not been seen in almost a year.

## 2015-10-17 NOTE — Telephone Encounter (Signed)
Encouraged patient to take her Valsartan, since her BP is high at 164/67 and HR 82. Will have her see Nell Range for evaluation of frequent dizziness. Patient is due for office visit per recall. Informed patient have someone drive her to urgent care or ED if she dizziness increases or she passes out. Patient advised to call back if she has any other concerns. Patient verbalized understanding.

## 2015-10-18 ENCOUNTER — Ambulatory Visit (HOSPITAL_COMMUNITY): Payer: Medicare Other

## 2015-10-20 ENCOUNTER — Ambulatory Visit
Admission: RE | Admit: 2015-10-20 | Discharge: 2015-10-20 | Disposition: A | Discharge status: Home / Self Care | Payer: Medicare Other | Source: Ambulatory Visit | Attending: Hematology and Oncology | Admitting: Hematology and Oncology

## 2015-10-20 DIAGNOSIS — IMO0001 Reserved for inherently not codable concepts without codable children: Secondary | ICD-10-CM

## 2015-10-20 DIAGNOSIS — M4850XS Collapsed vertebra, not elsewhere classified, site unspecified, sequela of fracture: Secondary | ICD-10-CM

## 2015-10-20 DIAGNOSIS — C9001 Multiple myeloma in remission: Secondary | ICD-10-CM

## 2015-10-21 ENCOUNTER — Ambulatory Visit (HOSPITAL_BASED_OUTPATIENT_CLINIC_OR_DEPARTMENT_OTHER): Payer: Medicare Other | Admitting: Hematology and Oncology

## 2015-10-21 ENCOUNTER — Telehealth: Payer: Self-pay | Admitting: Hematology and Oncology

## 2015-10-21 ENCOUNTER — Other Ambulatory Visit: Payer: Self-pay | Admitting: *Deleted

## 2015-10-21 ENCOUNTER — Other Ambulatory Visit: Payer: Medicare Other

## 2015-10-21 ENCOUNTER — Encounter: Payer: Self-pay | Admitting: Hematology and Oncology

## 2015-10-21 VITALS — BP 140/55 | HR 71 | Temp 97.9°F | Resp 16 | Ht 65.0 in | Wt 114.9 lb

## 2015-10-21 DIAGNOSIS — M4850XA Collapsed vertebra, not elsewhere classified, site unspecified, initial encounter for fracture: Secondary | ICD-10-CM | POA: Diagnosis not present

## 2015-10-21 DIAGNOSIS — M4850XS Collapsed vertebra, not elsewhere classified, site unspecified, sequela of fracture: Secondary | ICD-10-CM

## 2015-10-21 DIAGNOSIS — M129 Arthropathy, unspecified: Secondary | ICD-10-CM

## 2015-10-21 DIAGNOSIS — IMO0001 Reserved for inherently not codable concepts without codable children: Secondary | ICD-10-CM

## 2015-10-21 DIAGNOSIS — C9000 Multiple myeloma not having achieved remission: Secondary | ICD-10-CM

## 2015-10-21 DIAGNOSIS — K5903 Drug induced constipation: Secondary | ICD-10-CM | POA: Diagnosis not present

## 2015-10-21 DIAGNOSIS — C9001 Multiple myeloma in remission: Secondary | ICD-10-CM | POA: Diagnosis present

## 2015-10-21 DIAGNOSIS — T402X5A Adverse effect of other opioids, initial encounter: Secondary | ICD-10-CM

## 2015-10-21 MED ORDER — TRAMADOL HCL 50 MG PO TABS: 50.0000 mg | ORAL_TABLET | Freq: Four times a day (QID) | ORAL | Status: DC | PRN

## 2015-10-21 NOTE — Telephone Encounter (Signed)
per pof to sch pt appt-adv pt to call [facility] to sch eval & mangt due to screening by [facility]

## 2015-10-21 NOTE — Assessment & Plan Note (Signed)
She has recurrent compression fractures noted on recent MRI. She is in severe pain but not using the support brace as she should. I recently increased her regimen for pain medicine but she is not taking it for fear that she might get constipated. She only took 1 pain medication yesterday. She wants to remain active working in the garden. I spent a lot of time educating the patient the rationale behind using narcotic prescription. I encouraged her to take 1-2 tablets as needed to get her pain under control. For constipation, she is recommended to use laxative therapy. For mild pain, she can take tramadol. She needs to use a thoracic brace on the regular basis. I will refer her back to IR for kyphoplasty of the T9 compression fracture which appears to be new. I will bring her back in 2 weeks for further assessment of pain control She is recommended to take calcium and vitamin D. She received IV Zometa recently

## 2015-10-21 NOTE — Assessment & Plan Note (Signed)
The patient had significant complication related to recurrent compression fracture. Her recent blood work recently showed she has early signs of relapse but not to the degree that would require treatment Continue supportive care with pain management, calcium with vitamin D

## 2015-10-21 NOTE — Assessment & Plan Note (Signed)
She is not taking her pain medication as prescribed for fear of constipation. I recommend she takes laxatives on a regular basis

## 2015-10-21 NOTE — Progress Notes (Signed)
[place] OFFICE PROGRESS NOTE  Patient Care Team: Thressa Sheller, MD as PCP - General (Internal Medicine) Thressa Sheller, MD (Internal Medicine) Renella Cunas, MD as Attending Physician (Cardiology) Heath Lark, MD as Consulting Physician (Hematology and Oncology) Luanne Bras, MD as Consulting Physician (Interventional Radiology)  SUMMARY OF ONCOLOGIC HISTORY: Oncology History   The     Multiple myeloma in remission [facility])   11/12/2008 Imaging Skeletal survey showed lytic lesion in the right femur compatible with  myeloma. There were Questionable skull lesions   11/15/2008 Bone Marrow Biopsy BONE MARROW ASPIRATE AND BIOPSY: showed NORMOCELLULAR MARROW FOR AGE WITH PLASMA CELL DYSCRASIA  (PLASMA CELLS 25%). 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 Subsequently she was placed on Revlimid & Dexamethasone alone   05/12/2013 Bone Marrow Biopsy Bone marrow biopsy showed persistent plasma cells. Blood work confirmed VGPR status   06/11/2014 Tumor Marker Bloodwork show disease relapse   06/26/2014 Procedure She has placement of port   07/05/2014 - 07/26/2014 Chemotherapy She received Elotuzumab and Revlimid. Rx was discontinued with elotuzumab per patient preference   08/01/2014 - 08/05/2014 Hospital Admission She was admitted to the hospital for kyphoplasty and pain management after traumatic compression fracture   08/23/2014 - 04/18/2015 Chemotherapy She was placed on dexamethasone & Revlimid   04/11/2015 Tumor Marker Blood work showed VGPR. Treatment was discontinued and she is maintained on Zometa only   10/20/2015 Imaging MRI spine showed acute/subacute superior endplate compression fractures at T2 and T9 are incompletely healed.     INTERVAL HISTORY: Please see below for problem oriented charting. She returns today to review  results of MRI. Last week, she rated severe pain with pain score 8 out of 10 pain. Her pain is localized on both rib cage area and worst in the mid/lower thoracic spine level corresponding to the new compression fracture at around T9 She was prescribed oxycodone. Despite that, she only took 1 tablet of pain medicine yesterday because she is afraid she might get constipated. She is not very mobile because of this but wants to a remain active She denies nausea.  REVIEW OF SYSTEMS:   Constitutional: Denies fevers, chills or abnormal weight loss Eyes: Denies blurriness of vision Ears, nose, mouth, throat, and face: Denies mucositis or sore throat Respiratory: Denies cough, dyspnea or wheezes Cardiovascular: Denies palpitation, chest discomfort or lower extremity swelling Skin: Denies abnormal skin rashes Lymphatics: Denies new lymphadenopathy or easy bruising Neurological:Denies numbness, tingling or new weaknesses Behavioral/Psych: Mood is stable, no new changes  All other systems were reviewed with the patient and are negative.  I have reviewed the past medical history, past surgical history, social history and family history with the patient and they are unchanged from previous note.  ALLERGIES:  has No Known Allergies.  MEDICATIONS:  Current Outpatient Prescriptions  Medication Sig Dispense Refill  . acetaminophen (TYLENOL) 325 MG tablet Take 650 mg by mouth every 6 (six) hours as needed for mild pain. Reported on 10/17/2015    . Alum & Mag Hydroxide-Simeth (MAGIC MOUTHWASH W/LIDOCAINE) SOLN Take 5 mLs by mouth 3 (three) times daily. 200 mL 0  . aspirin 325 MG EC tablet Take 325 mg by mouth daily.    . cholecalciferol (VITAMIN D) 1000 UNITS tablet Take 1,000 Units by mouth daily.    . diphenhydramine-acetaminophen (TYLENOL PM) 25-500 MG TABS Take 1 tablet by mouth at bedtime as  needed (Sleep). Reported on 10/17/2015    . gabapentin (NEURONTIN) 300 MG capsule TAKE ONE CAPSULE BY MOUTH  THREE TIMES DAILY 90 capsule 3  . levothyroxine (SYNTHROID, LEVOTHROID) 50 MCG tablet Take 50 mcg by mouth daily before breakfast.    . loperamide (IMODIUM A-D) 2 MG tablet Take 2 mg by mouth as needed for diarrhea or loose stools (as directed.). Reported on 10/17/2015    . mirtazapine (REMERON) 7.5 MG tablet TAKE ONE TABLET BY MOUTH NIGHTLY AT BEDTIME 30 tablet 3  . Multiple Vitamin (MULTIVITAMIN WITH MINERALS) TABS tablet Take 1 tablet by mouth daily.    [name] oxyCODONE-acetaminophen (PERCOCET/ROXICET) 5-325 MG tablet Take 2 tablets by mouth every 6 (six) hours as needed for severe pain. 60 tablet 0  . Polyvinyl Alcohol-Povidone (REFRESH OP) Place 2 drops into both eyes 2 (two) times daily as needed (dry eyes).    [name] PRESCRIPTION MEDICATION Reported on 10/17/2015    . traMADol (ULTRAM) 50 MG tablet Take 1 tablet (50 mg total) by mouth every 6 (six) hours as needed for moderate pain. 90 tablet 0  . valsartan (DIOVAN) 160 MG tablet Take 1 tablet (160 mg total) by mouth daily. 30 tablet 3   No current facility-administered medications for this visit.    PHYSICAL EXAMINATION: ECOG PERFORMANCE STATUS: 2 - Symptomatic, <50% confined to bed  Filed Vitals:   10/21/15 1131  BP: 140/55  Pulse: 71  Temp: 97.9 F (36.6 C)  Resp: 16   Filed Weights   10/21/15 1131  Weight: 114 lb 14.4 oz (52.118 kg)    GENERAL:alert, no distress and comfortable SKIN: skin color, texture, turgor are normal, no rashes or significant lesions EYES: normal, Conjunctiva are pink and non-injected, sclera clear Musculoskeletal:no cyanosis of digits and no clubbing  NEURO: alert & oriented x 3 with fluent speech, no focal motor/sensory deficits  LABORATORY DATA:  I have reviewed the data as listed    Component Value Date/Time   NA 137 09/19/2015 1547   NA 138 09/21/2014 0725   K 4.6 09/19/2015 1547   K 4.1 09/21/2014 0725   CL 103 09/21/2014 0725   CL 103 11/25/2012 1328   CO2 28 09/19/2015 1547   CO2 30  09/21/2014 0725   GLUCOSE 73 09/19/2015 1547   GLUCOSE 76 09/21/2014 0725   GLUCOSE 91 11/25/2012 1328   BUN 20.2 09/19/2015 1547   BUN 11 09/21/2014 0725   CREATININE 1.0 09/19/2015 1547   CREATININE 0.77 09/21/2014 0725   CALCIUM 9.3 09/19/2015 1547   CALCIUM 8.3* 09/21/2014 0725   PROT 7.7 09/19/2015 1547   PROT 7.2 09/19/2015 1540   PROT 5.7* 09/21/2014 0725   ALBUMIN 3.7 09/19/2015 1547   ALBUMIN 3.0* 09/21/2014 0725   AST 31 09/19/2015 1547   AST 22 09/21/2014 0725   ALT 21 09/19/2015 1547   ALT 17 09/21/2014 0725   ALKPHOS 69 09/19/2015 1547   ALKPHOS 43 09/21/2014 0725   BILITOT 0.38 09/19/2015 1547   BILITOT 0.7 09/21/2014 0725   GFRNONAA 74* 09/21/2014 0725   GFRAA 86* 09/21/2014 0725    No results found for: SPEP, UPEP  Lab Results  Component Value Date   WBC 3.3* 09/19/2015   NEUTROABS 1.4* 09/19/2015   HGB 11.2* 09/19/2015   HCT 33.4* 09/19/2015   MCV 100.3 09/19/2015   PLT 180 09/19/2015      Chemistry      Component Value Date/Time   NA 137 09/19/2015 1547   NA 138 09/21/2014  0725   K 4.6 09/19/2015 1547   K 4.1 09/21/2014 0725   CL 103 09/21/2014 0725   CL 103 11/25/2012 1328   CO2 28 09/19/2015 1547   CO2 30 09/21/2014 0725   BUN 20.2 09/19/2015 1547   BUN 11 09/21/2014 0725   CREATININE 1.0 09/19/2015 1547   CREATININE 0.77 09/21/2014 0725      Component Value Date/Time   CALCIUM 9.3 09/19/2015 1547   CALCIUM 8.3* 09/21/2014 0725   ALKPHOS 69 09/19/2015 1547   ALKPHOS 43 09/21/2014 0725   AST 31 09/19/2015 1547   AST 22 09/21/2014 0725   ALT 21 09/19/2015 1547   ALT 17 09/21/2014 0725   BILITOT 0.38 09/19/2015 1547   BILITOT 0.7 09/21/2014 0725       RADIOGRAPHIC STUDIES: I have personally reviewed the radiological images as listed and agreed with the findings in the report. Mr Total Spine Mets Screening  10/20/2015  CLINICAL DATA:  Multiple myeloma. Vertebral compression fracture. Severe back pain extending at left lower  posterior rib. EXAM: MRI TOTAL SPINE WITHOUT CONTRAST TECHNIQUE: Multisequence MR imaging of the spine from the cervical spine to the sacrum was performed prior to and following IV contrast administration for evaluation of spinal metastatic disease. COMPARISON:  MRI of the thoracic spine 07/24/2014 is present. FINDINGS: Cervical Findings: Diffuse heterogeneous marrow signal is compatible with the given diagnosis of multiple myeloma. Degenerative anterolisthesis at C4-5 and slight retrolisthesis at C5-6 is stable. Endplate degenerative changes at C5-6 demonstrate some progression. There is slight increased intrinsic cord signal at C5-6 and posterior to the C6 vertebral body suggesting edema or myelomalacia. Cord size is normal. Limited axial imaging was performed secondary to the total spine protocol. The patient would not tolerate additional imaging. C5-6: A right paramedian disc protrusion effaces the ventral CSF the foramina are patent. No significant foraminal stenosis is present. Thoracic Findings: Cord signal is within normal limits throughout the thoracic spinal cord. A new superior endplate compression fracture is present at T9. There is marrow signal change along the superior endplate. 30% loss of height is evident relative to the previous study. There is also an incompletely healed fracture involving the superior endplate of T2. 73% loss of height is evident compare to the prior study. There is no retropulsed bone. Osseous foraminal narrowing at T1-2 is stable. Marrow signal is diffusely heterogeneous without significant change otherwise. Bilateral foraminal narrowing is evident at T8-9 due to facet hypertrophy. This has progressed since the prior exam. The T12 fracture is stable following vertebral augmentation. There is slight retropulsed bone at T12 with partial effacement of the ventral CSF but no significant central canal stenosis. Lumbar Findings: A remote L3 superior endplate compression fracture is  noted. Grade 1 anterolisthesis is present at L4-5. Moderate facet hypertrophy and a broad-based disc protrusion at L4-5 leads to moderate central canal stenosis. Mild to moderate foraminal narrowing is present bilaterally. The lumbar foramina are otherwise widely patent. IMPRESSION: 1. Acute/subacute superior endplate compression fractures at T2 and T9 are incompletely healed. 2. Remote T12 compression fracture is stable following vertebral augmentation. 3. Remote L3 superior endplate fracture. 4. Grade 1 anterolisthesis at L4-5 with moderate central and mild to moderate foraminal stenosis bilaterally. 5. Mild bilateral foraminal narrowing at T8-9 secondary to facet disease. 6. Broad-based disc protrusion with moderate central canal stenosis and subtle cord signal abnormality at C5-6. Electronically Signed   By: San Morelle M.D.   On: 10/20/2015 18:20     ASSESSMENT & PLAN:  Multiple myeloma in remission [facility]) The patient had significant complication related to recurrent compression fracture. Her recent blood work recently showed she has early signs of relapse but not to the degree that would require treatment Continue supportive care with pain management, calcium with vitamin D   Vertebral compression fracture She has recurrent compression fractures noted on recent MRI. She is in severe pain but not using the support brace as she should. I recently increased her regimen for pain medicine but she is not taking it for fear that she might get constipated. She only took 1 pain medication yesterday. She wants to remain active working in the garden. I spent a lot of time educating the patient the rationale behind using narcotic prescription. I encouraged her to take 1-2 tablets as needed to get her pain under control. For constipation, she is recommended to use laxative therapy. For mild pain, she can take tramadol. She needs to use a thoracic brace on the regular basis. I will refer her back  to IR for kyphoplasty of the T9 compression fracture which appears to be new. I will bring her back in 2 weeks for further assessment of pain control She is recommended to take calcium and vitamin D. She received IV Zometa recently  Constipation due to opioid therapy She is not taking her pain medication as prescribed for fear of constipation. I recommend she takes laxatives on a regular basis   Orders Placed This Encounter  Procedures  . IR ABLATE BONE TUMOR(S) CRYO    Standing Status: Future     Number of Occurrences:      Standing Expiration Date: 12/20/2016    Order Specific Question:  Reason for Exam (SYMPTOM  OR DIAGNOSIS REQUIRED)    Answer:  kyphoplasty T9    Order Specific Question:  Preferred Imaging Location?    Answer:  [facility]  . IR Radiologist Eval & Mgmt    Standing Status: Future     Number of Occurrences:      Standing Expiration Date: 12/20/2016    Order Specific Question:  Reason for Exam (SYMPTOM  OR DIAGNOSIS REQUIRED)    Answer:  for kyphoplasty    Order Specific Question:  Preferred Imaging Location?    Answer:  [facility]   All questions were answered. The patient knows to call the clinic with any problems, questions or concerns. No barriers to learning was detected. I spent 20 minutes counseling the patient face to face. The total time spent in the appointment was 30 minutes and more than 50% was on counseling and review of test results     [facility], [place], MD 10/21/2015 1:10 PM

## 2015-10-21 NOTE — Progress Notes (Signed)
Cardiology Office Note    Date:  10/23/2015   ID:  Natasha Jackson, DOB 06-18-1926, MRN [id number]  PCP:  Natasha Sheller, MD  Cardiologist:  Dr. Johnsie Cancel   Dizziness   History of Present Illness:  Natasha Jackson is a 82 y.o. female HTN, multiple myeloma in remission with recurrent compression fractures, hx of breast cancer s/p lumpectomy/chemo/XRT, mild LV dysfunction w/ no clinical CHF (EF 40-45%), mild AR/mild-mod MR, hypothyroidism, bradycardia and LBBB who presents to clinic for yearly follow up and evaluation of dizziness.    She saw Dr. Stanford Jackson in 09/2012 for evaluation of bradycardia and dizziness. Holter monitor in 09/2012 showed SR with PAC's and PVC's; mean HR 62 with min rate early in am 46 with sinus bradycaria no AV block. Dr. Stanford Jackson noted that because of her MM, she would be at risk for amyloidosis as a possible contributor to her conduction disease.   2D ECHO 10/2013 showed EF 40-45% w/ diffuse HK, G1DD, mild AR, mild-mod MR, lipomatous hypertrophy of atrial septum.   She was seen in the hospital by Dr Natasha Jackson 3/16/16for atypical chest pain after a fracture Myovue at that time was normal w/ EF 64%.  She was last seen in the office by Dr. Johnsie Cancel in 10/2014. She wanted to cut back on Valsartan.  She called the office on 10/17/15 about multiple episodes of dizziness. She thought that it was due to her valsartan and stopped it. Her blood pressure then increased. She was encouraged to resume her valsartan and was added on to my clinic schedule.  Today she presents to clinic for follow up. She had a severe episode of dizziness last week that caused her to throw up. She had run lots of errands that day and had not eaten lunch. It was not related to a certain head movement. She felt like the room was spinning and she had all the table to study herself. She has not had any more problems since. No CP or SOB. She does have fatigue. No LE edema, orthopnea or PND.   Past Medical History    Diagnosis Date  . Aortic regurgitation   . HTN (hypertension)   . Hot flash not due to menopause 12/23/2011  . Neuropathy ([place]) 12/23/2011  . DCIS (ductal carcinoma in situ) 08/04/2012  . Breast cancer ([place])   . Multiple myeloma   . Depression 09/05/2014  . Spinal fracture of T12 vertebra ([facility])     vertebral fx  . Coronary artery disease   . Hypothyroidism 10/18/2014  . Multiple myeloma ([place]) 10/11/2015    Past Surgical History  Procedure Laterality Date  . Lymph node excision - left groin    . Incisional hernia repair      Current Medications: Outpatient Prescriptions Prior to Visit  Medication Sig Dispense Refill  . acetaminophen (TYLENOL) 325 MG tablet Take 650 mg by mouth every 6 (six) hours as needed for mild pain. Reported on 10/17/2015    . Alum & Mag Hydroxide-Simeth (MAGIC MOUTHWASH W/LIDOCAINE) SOLN Take 5 mLs by mouth 3 (three) times daily. 200 mL 0  . aspirin 325 MG EC tablet Take 325 mg by mouth daily.    . cholecalciferol (VITAMIN D) 1000 UNITS tablet Take 1,000 Units by mouth daily.    . diphenhydramine-acetaminophen (TYLENOL PM) 25-500 MG TABS Take 1 tablet by mouth at bedtime as needed (Sleep). Reported on 10/17/2015    . gabapentin (NEURONTIN) 300 MG capsule TAKE ONE CAPSULE BY MOUTH THREE TIMES DAILY 90 capsule  3  . levothyroxine (SYNTHROID, LEVOTHROID) 50 MCG tablet Take 50 mcg by mouth daily before breakfast.    . loperamide (IMODIUM A-D) 2 MG tablet Take 2 mg by mouth as needed for diarrhea or loose stools (as directed.). Reported on 10/17/2015    . mirtazapine (REMERON) 7.5 MG tablet TAKE ONE TABLET BY MOUTH NIGHTLY AT BEDTIME 30 tablet 3  . Multiple Vitamin (MULTIVITAMIN WITH MINERALS) TABS tablet Take 1 tablet by mouth daily.    [name] oxyCODONE-acetaminophen (PERCOCET/ROXICET) 5-325 MG tablet Take 2 tablets by mouth every 6 (six) hours as needed for severe pain. 60 tablet 0  . Polyvinyl Alcohol-Povidone (REFRESH OP) Place 2 drops into both eyes 2 (two) times daily as  needed (dry eyes).    [name] PRESCRIPTION MEDICATION Reported on 10/17/2015    . traMADol (ULTRAM) 50 MG tablet Take 1 tablet (50 mg total) by mouth every 6 (six) hours as needed for moderate pain. 90 tablet 0  . valsartan (DIOVAN) 160 MG tablet Take 1 tablet (160 mg total) by mouth daily. 30 tablet 3   No facility-administered medications prior to visit.     Allergies:   Review of patient's allergies indicates no known allergies.   Social History   Social History  . Marital Status: Married    Spouse Name: N/A  . Number of Children: N/A  . Years of Education: N/A   Social History Main Topics  . Smoking status: Never Smoker   . Smokeless tobacco: Never Used  . Alcohol Use: No  . Drug Use: No  . Sexual Activity: No   Other Topics Concern  . None   Social History Narrative     Family History:  The patient's family history includes Heart failure in her mother; Stroke in her father.   ROS:   Please see the history of present illness.    ROS All other systems reviewed and are negative.   PHYSICAL EXAM:   VS:  BP 150/62 mmHg  Pulse 72  Ht 5' 5"  (1.651 m)  Wt 115 lb (52.164 kg)  BMI 19.14 kg/m2   GEN: Well nourished, well developed, in no acute distress HEENT: normal Neck: no JVD, carotid bruits, or masses Cardiac: RRR; soft murmur, rubs, or gallops,no edema  Respiratory:  clear to auscultation bilaterally, normal work of breathing GI: soft, nontender, nondistended, + BS MS: no deformity or atrophy Skin: warm and dry, no rash Neuro:  Alert and Oriented x 3, Strength and sensation are intact Psych: euthymic mood, full affect  Wt Readings from Last 3 Encounters:  10/23/15 115 lb (52.164 kg)  10/21/15 114 lb 14.4 oz (52.118 kg)  09/26/15 118 lb 12.8 oz (53.887 kg)      Studies/Labs Reviewed:   EKG:  EKG is ordered today.  The ekg ordered today demonstrates NSR, LAD, LBBB. HR 64  Recent Labs: 09/19/2015: ALT 21; BUN 20.2; Creatinine 1.0; HGB 11.2*; Platelets 180;  Potassium 4.6; Sodium 137   Lipid Panel No results found for: CHOL, TRIG, HDL, CHOLHDL, VLDL, LDLCALC, LDLDIRECT  Additional studies/ records that were reviewed today include:  Myoview: 09/2014 IMPRESSION: 1. No reversible ischemia or infarction. 2. Normal left ventricular wall motion. 3. Left ventricular ejection fraction 64% 4. Low-risk stress test findings*.  2D ECHO: 10/13/2013 LV EF: 40% -  45% Study Conclusion: - Left ventricle: The cavity size was normal. Systolic function was mildly to moderately reduced. The estimated ejection fraction was in the range of 40% to 45%. Diffuse hypokinesis. There was an  increased relative contribution of atrial contraction to ventricular filling. Doppler parameters are consistent with abnormal left ventricular relaxation (grade 1 diastolic dysfunction). - Ventricular septum: Septal motion showed paradox. - Aortic valve: Mild regurgitation. - Mitral valve: Mild to moderate regurgitation. - Atrial septum: There was increased thickness of the septum, consistent with lipomatous hypertrophy.     ASSESSMENT:    1. Dizziness   2. Essential hypertension   3. Valvular disease   4. LV dysfunction   5. Hypothyroidism, unspecified hypothyroidism type      PLAN:  In order of problems listed above:  Dizziness: sounds like vertigo. It has not recurred since last week. Unlikely to be related to her valsartan as her BP has been high. Will have her resume this. Of note, per review of old notes, she does have multiple myeloma and would be at risk for amyloidosis as a possible contributor to conduction disease. However she is being treated for her myeloma and this was felt to be less likely. Cardiac MRI can be pursued in the future if her conduction system disease worsens. However, this seems stable. ECG with chronic LBBB.   HTN: BP 150/62 today. I personally rechecked blood pressure and it was 142/65. As above, resume valsartan 119m  daily  Mild AR/mild-mod MR: will update 2D ECHO.   LV dysfunction: EF 40-45% by last echocardiogram, as above I will repeat 2D ECHO. I do not see that she has ever had clinical CHF. Continue valsartan.   LBBB: stable. Follow ECGs  Hypothyroidism: continue synthroid   Multiple myeloma w/ recurrent compression fractures: followed closely by Dr. GAlvy Bimler  Hx of breast cancer: in remission.    Medication Adjustments/Labs and Tests Ordered: Current medicines are reviewed at length with the patient today.  Concerns regarding medicines are outlined above.  Medication changes, Labs and Tests ordered today are listed in the Patient Instructions below. Patient Instructions  Medication Instructions:  Your physician recommends that you continue on your current medications as directed. Please refer to the Current Medication list given to you today.   Labwork: None ordered  Testing/Procedures: .Your physician has requested that you have an echocardiogram. Echocardiography is a painless test that uses sound waves to create images of your heart. It provides your doctor with information about the size and shape of your heart and how well your heart's chambers and valves are working. This procedure takes approximately one hour. There are no restrictions for this procedure.   Follow-Up: Your physician recommends that you schedule a follow-up appointment in: 3 MONTHS WITH DR. NJohnsie Cancel  Any Other Special Instructions Will Be Listed Below (If Applicable).  Echocardiogram An echocardiogram, or echocardiography, uses sound waves (ultrasound) to produce an image of your heart. The echocardiogram is simple, painless, obtained within a short period of time, and offers valuable information to your health care provider. The images from an echocardiogram can provide information such as:  Evidence of coronary artery disease (CAD).  Heart size.  Heart muscle function.  Heart valve function.  Aneurysm  detection.  Evidence of a past heart attack.  Fluid buildup around the heart.  Heart muscle thickening.  Assess heart valve function. LET [facility] PROVIDER KNOW ABOUT:  Any allergies you have.  All medicines you are taking, including vitamins, herbs, eye drops, creams, and over-the-counter medicines.  Previous problems you or members of your family have had with the use of anesthetics.  Any blood disorders you have.  Previous surgeries you have had.  Medical  conditions you have.  Possibility of pregnancy, if this applies. BEFORE THE PROCEDURE  No special preparation is needed. Eat and drink normally.  PROCEDURE   In order to produce an image of your heart, gel will be applied to your chest and a wand-like tool (transducer) will be moved over your chest. The gel will help transmit the sound waves from the transducer. The sound waves will harmlessly bounce off your heart to allow the heart images to be captured in real-time motion. These images will then be recorded.  You may need an IV to receive a medicine that improves the quality of the pictures. AFTER THE PROCEDURE You may return to your normal schedule including diet, activities, and medicines, unless your health care provider tells you otherwise.   This information is not intended to replace advice given to you by your health care provider. Make sure you discuss any questions you have with your health care provider.   Document Released: 06/19/2000 Document Revised: 07/13/2014 Document Reviewed: 02/27/2013 Elsevier Interactive Patient Education [employer].    If you need a refill on your cardiac medications before your next appointment, please call your pharmacy.       Renea Ee  10/23/2015 11:42 AM    [facility] [place], [place], [place]  [address] Phone: [phone]; Fax: [phone]

## 2015-10-23 ENCOUNTER — Encounter: Payer: Self-pay | Admitting: Physician Assistant

## 2015-10-23 ENCOUNTER — Ambulatory Visit (INDEPENDENT_AMBULATORY_CARE_PROVIDER_SITE_OTHER): Payer: Medicare Other | Admitting: Physician Assistant

## 2015-10-23 VITALS — BP 150/62 | HR 72 | Ht 65.0 in | Wt 115.0 lb

## 2015-10-23 DIAGNOSIS — I10 Essential (primary) hypertension: Secondary | ICD-10-CM

## 2015-10-23 DIAGNOSIS — R42 Dizziness and giddiness: Secondary | ICD-10-CM | POA: Diagnosis not present

## 2015-10-23 DIAGNOSIS — I519 Heart disease, unspecified: Secondary | ICD-10-CM | POA: Diagnosis not present

## 2015-10-23 DIAGNOSIS — I38 Endocarditis, valve unspecified: Secondary | ICD-10-CM | POA: Diagnosis not present

## 2015-10-23 DIAGNOSIS — E039 Hypothyroidism, unspecified: Secondary | ICD-10-CM

## 2015-10-23 DIAGNOSIS — C9001 Multiple myeloma in remission: Secondary | ICD-10-CM

## 2015-10-23 DIAGNOSIS — Z853 Personal history of malignant neoplasm of breast: Secondary | ICD-10-CM

## 2015-10-23 MED ORDER — VALSARTAN 160 MG PO TABS: 160.0000 mg | ORAL_TABLET | Freq: Every day | ORAL | Status: DC

## 2015-10-23 NOTE — Patient Instructions (Addendum)
Medication Instructions:  Your physician recommends that you continue on your current medications as directed. Please refer to the Current Medication list given to you today.   Labwork: None ordered  Testing/Procedures: .Your physician has requested that you have an echocardiogram. Echocardiography is a painless test that uses sound waves to create images of your heart. It provides your doctor with information about the size and shape of your heart and how well your heart's chambers and valves are working. This procedure takes approximately one hour. There are no restrictions for this procedure.   Follow-Up: Your physician recommends that you schedule a follow-up appointment in: 3 MONTHS WITH DR. Johnsie Cancel   Any Other Special Instructions Will Be Listed Below (If Applicable).  Echocardiogram An echocardiogram, or echocardiography, uses sound waves (ultrasound) to produce an image of your heart. The echocardiogram is simple, painless, obtained within a short period of time, and offers valuable information to your health care provider. The images from an echocardiogram can provide information such as:  Evidence of coronary artery disease (CAD).  Heart size.  Heart muscle function.  Heart valve function.  Aneurysm detection.  Evidence of a past heart attack.  Fluid buildup around the heart.  Heart muscle thickening.  Assess heart valve function. LET [facility] PROVIDER KNOW ABOUT:  Any allergies you have.  All medicines you are taking, including vitamins, herbs, eye drops, creams, and over-the-counter medicines.  Previous problems you or members of your family have had with the use of anesthetics.  Any blood disorders you have.  Previous surgeries you have had.  Medical conditions you have.  Possibility of pregnancy, if this applies. BEFORE THE PROCEDURE  No special preparation is needed. Eat and drink normally.  PROCEDURE   In order to produce an image of your  heart, gel will be applied to your chest and a wand-like tool (transducer) will be moved over your chest. The gel will help transmit the sound waves from the transducer. The sound waves will harmlessly bounce off your heart to allow the heart images to be captured in real-time motion. These images will then be recorded.  You may need an IV to receive a medicine that improves the quality of the pictures. AFTER THE PROCEDURE You may return to your normal schedule including diet, activities, and medicines, unless your health care provider tells you otherwise.   This information is not intended to replace advice given to you by your health care provider. Make sure you discuss any questions you have with your health care provider.   Document Released: 06/19/2000 Document Revised: 07/13/2014 Document Reviewed: 02/27/2013 Elsevier Interactive Patient Education [employer].    If you need a refill on your cardiac medications before your next appointment, please call your pharmacy.

## 2015-11-01 ENCOUNTER — Encounter: Payer: Self-pay | Admitting: *Deleted

## 2015-11-01 ENCOUNTER — Ambulatory Visit (HOSPITAL_COMMUNITY)
Admission: RE | Admit: 2015-11-01 | Discharge: 2015-11-01 | Disposition: A | Discharge status: Home / Self Care | Payer: Medicare Other | Source: Ambulatory Visit | Attending: Hematology and Oncology | Admitting: Hematology and Oncology

## 2015-11-01 DIAGNOSIS — IMO0001 Reserved for inherently not codable concepts without codable children: Secondary | ICD-10-CM

## 2015-11-01 DIAGNOSIS — M4850XA Collapsed vertebra, not elsewhere classified, site unspecified, initial encounter for fracture: Principal | ICD-10-CM

## 2015-11-04 ENCOUNTER — Telehealth: Payer: Self-pay | Admitting: Hematology and Oncology

## 2015-11-04 ENCOUNTER — Other Ambulatory Visit: Payer: Self-pay | Admitting: *Deleted

## 2015-11-04 ENCOUNTER — Other Ambulatory Visit (HOSPITAL_COMMUNITY): Payer: Self-pay | Admitting: Interventional Radiology

## 2015-11-04 DIAGNOSIS — M546 Pain in thoracic spine: Secondary | ICD-10-CM

## 2015-11-04 DIAGNOSIS — IMO0002 Reserved for concepts with insufficient information to code with codable children: Secondary | ICD-10-CM

## 2015-11-04 NOTE — Telephone Encounter (Signed)
s.w. pt and cx 5.2 appt and moved to 5.15...Marland Kitchenpt ok and aware

## 2015-11-05 ENCOUNTER — Telehealth (HOSPITAL_COMMUNITY): Payer: Self-pay | Admitting: Interventional Radiology

## 2015-11-05 ENCOUNTER — Ambulatory Visit: Payer: Medicare Other | Admitting: Hematology and Oncology

## 2015-11-05 ENCOUNTER — Ambulatory Visit (HOSPITAL_COMMUNITY)
Admission: RE | Admit: 2015-11-05 | Discharge: 2015-11-05 | Disposition: A | Discharge status: Home / Self Care | Payer: Medicare Other | Source: Ambulatory Visit | Attending: Interventional Radiology | Admitting: Interventional Radiology

## 2015-11-05 ENCOUNTER — Encounter (HOSPITAL_COMMUNITY): Payer: Self-pay

## 2015-11-05 DIAGNOSIS — M4316 Spondylolisthesis, lumbar region: Secondary | ICD-10-CM | POA: Diagnosis not present

## 2015-11-05 DIAGNOSIS — I10 Essential (primary) hypertension: Secondary | ICD-10-CM | POA: Diagnosis not present

## 2015-11-05 DIAGNOSIS — I447 Left bundle-branch block, unspecified: Secondary | ICD-10-CM | POA: Diagnosis not present

## 2015-11-05 DIAGNOSIS — Z8249 Family history of ischemic heart disease and other diseases of the circulatory system: Secondary | ICD-10-CM | POA: Insufficient documentation

## 2015-11-05 DIAGNOSIS — G629 Polyneuropathy, unspecified: Secondary | ICD-10-CM | POA: Diagnosis not present

## 2015-11-05 DIAGNOSIS — M50321 Other cervical disc degeneration at C4-C5 level: Secondary | ICD-10-CM | POA: Insufficient documentation

## 2015-11-05 DIAGNOSIS — Z853 Personal history of malignant neoplasm of breast: Secondary | ICD-10-CM | POA: Diagnosis not present

## 2015-11-05 DIAGNOSIS — M50222 Other cervical disc displacement at C5-C6 level: Secondary | ICD-10-CM | POA: Diagnosis not present

## 2015-11-05 DIAGNOSIS — I351 Nonrheumatic aortic (valve) insufficiency: Secondary | ICD-10-CM | POA: Diagnosis not present

## 2015-11-05 DIAGNOSIS — M546 Pain in thoracic spine: Secondary | ICD-10-CM

## 2015-11-05 DIAGNOSIS — I2510 Atherosclerotic heart disease of native coronary artery without angina pectoris: Secondary | ICD-10-CM | POA: Insufficient documentation

## 2015-11-05 DIAGNOSIS — E039 Hypothyroidism, unspecified: Secondary | ICD-10-CM | POA: Diagnosis not present

## 2015-11-05 DIAGNOSIS — F329 Major depressive disorder, single episode, unspecified: Secondary | ICD-10-CM | POA: Diagnosis not present

## 2015-11-05 DIAGNOSIS — M4806 Spinal stenosis, lumbar region: Secondary | ICD-10-CM | POA: Insufficient documentation

## 2015-11-05 DIAGNOSIS — Z7982 Long term (current) use of aspirin: Secondary | ICD-10-CM | POA: Diagnosis not present

## 2015-11-05 DIAGNOSIS — C9001 Multiple myeloma in remission: Secondary | ICD-10-CM | POA: Insufficient documentation

## 2015-11-05 DIAGNOSIS — Z9221 Personal history of antineoplastic chemotherapy: Secondary | ICD-10-CM | POA: Insufficient documentation

## 2015-11-05 DIAGNOSIS — IMO0002 Reserved for concepts with insufficient information to code with codable children: Secondary | ICD-10-CM

## 2015-11-05 DIAGNOSIS — M4854XA Collapsed vertebra, not elsewhere classified, thoracic region, initial encounter for fracture: Secondary | ICD-10-CM | POA: Diagnosis not present

## 2015-11-05 LAB — PROTIME-INR
INR: 1.01 (ref 0.00–1.49)
PROTHROMBIN TIME: 13.5 s (ref 11.6–15.2)

## 2015-11-05 LAB — CBC
HEMATOCRIT: 34.2 % — AB (ref 36.0–46.0)
Hemoglobin: 11.4 g/dL — ABNORMAL LOW (ref 12.0–15.0)
MCH: 33.9 pg (ref 26.0–34.0)
MCHC: 33.3 g/dL (ref 30.0–36.0)
MCV: 101.8 fL — ABNORMAL HIGH (ref 78.0–100.0)
PLATELETS: 211 10*3/uL (ref 150–400)
RBC: 3.36 MIL/uL — ABNORMAL LOW (ref 3.87–5.11)
RDW: 15.2 % (ref 11.5–15.5)
WBC: 3.4 10*3/uL — AB (ref 4.0–10.5)

## 2015-11-05 LAB — BASIC METABOLIC PANEL
ANION GAP: 9 (ref 5–15)
BUN: 19 mg/dL (ref 6–20)
CALCIUM: 9.6 mg/dL (ref 8.9–10.3)
CO2: 27 mmol/L (ref 22–32)
Chloride: 100 mmol/L — ABNORMAL LOW (ref 101–111)
Creatinine, Ser: 0.88 mg/dL (ref 0.44–1.00)
GFR calc Af Amer: >60 mL/min (ref >60)
GFR, EST NON AFRICAN AMERICAN: 57 mL/min — AB (ref >60)
Glucose, Bld: 78 mg/dL (ref 65–99)
Potassium: 3.9 mmol/L (ref 3.5–5.1)
Sodium: 136 mmol/L (ref 135–145)

## 2015-11-05 LAB — APTT: APTT: 27 s (ref 24–37)

## 2015-11-05 MED ORDER — MIDAZOLAM HCL 2 MG/2ML IJ SOLN
INTRAMUSCULAR | Status: AC | PRN
  Administered 2015-11-05 (×3): 1 mg via INTRAVENOUS

## 2015-11-05 MED ORDER — SODIUM CHLORIDE 0.9 % IV SOLN: INTRAVENOUS | Status: DC

## 2015-11-05 MED ORDER — TOBRAMYCIN SULFATE 1.2 G IJ SOLR
INTRAMUSCULAR | Status: AC
  Administered 2015-11-05: 0.1 g
  Filled 2015-11-05: qty 1.2

## 2015-11-05 MED ORDER — HYDROMORPHONE HCL 1 MG/ML IJ SOLN
INTRAMUSCULAR | Status: AC
  Filled 2015-11-05: qty 1

## 2015-11-05 MED ORDER — CEFAZOLIN SODIUM-DEXTROSE 2-4 GM/100ML-% IV SOLN
INTRAVENOUS | Status: AC
  Filled 2015-11-05: qty 100

## 2015-11-05 MED ORDER — SODIUM CHLORIDE 0.9 % IV SOLN: INTRAVENOUS | Status: AC

## 2015-11-05 MED ORDER — SODIUM CHLORIDE 0.9 % IV SOLN
INTRAVENOUS | Status: AC | PRN
  Administered 2015-11-05: 10 mL/h via INTRAVENOUS

## 2015-11-05 MED ORDER — BUPIVACAINE HCL (PF) 0.25 % IJ SOLN
INTRAMUSCULAR | Status: AC
  Administered 2015-11-05: 15 mL
  Filled 2015-11-05: qty 30

## 2015-11-05 MED ORDER — FENTANYL CITRATE (PF) 100 MCG/2ML IJ SOLN
INTRAMUSCULAR | Status: AC
  Filled 2015-11-05: qty 4

## 2015-11-05 MED ORDER — IOPAMIDOL (ISOVUE-300) INJECTION 61%
INTRAVENOUS | Status: AC
  Administered 2015-11-05: 1 mL
  Filled 2015-11-05: qty 50

## 2015-11-05 MED ORDER — CEFAZOLIN SODIUM-DEXTROSE 2-4 GM/100ML-% IV SOLN: 2.0000 g | Freq: Once | INTRAVENOUS | Status: DC

## 2015-11-05 MED ORDER — FENTANYL CITRATE (PF) 100 MCG/2ML IJ SOLN
INTRAMUSCULAR | Status: AC | PRN
  Administered 2015-11-05 (×3): 25 ug via INTRAVENOUS

## 2015-11-05 MED ORDER — MIDAZOLAM HCL 2 MG/2ML IJ SOLN
INTRAMUSCULAR | Status: AC
  Filled 2015-11-05: qty 4

## 2015-11-05 NOTE — Procedures (Signed)
S/P T 9 balloon  KP 

## 2015-11-05 NOTE — Telephone Encounter (Signed)
Called Dr. Alvy Bimler to see if she wanted a biopsy performed on Natasha Jackson when she has her T9 KP/VP. Dr. Alvy Bimler does NOT want a biopsy. JM

## 2015-11-05 NOTE — H&P (Signed)
Chief Complaint: Severe Back pain  Referring Physician(s): No referring MD. Pt known to Dr. Estanislado Pandy   Supervising Physician: Luanne Bras  Patient Status: Out-pt  History of Present Illness: Natasha Jackson is a 82 y.o. female with HTN, hx of breast cancer s/p lumpectomy/chemo/XRT, mild LV dysfunction w/ no clinical CHF (EF 40-45%), mild AR/mild-mod MR, hypothyroidism, bradycardia and LBBB.   She has multiple myeloma in remission with recurrent compression fractures.  She underwent a biopsy and  kyphoplasty of T 12 by Dr. Estanislado Pandy back in January of 2016.  She started having more back pain around January. She states she became ill and coughed a lot and her back has been hurting ever since.  MRI done April 16=  1. Acute/subacute superior endplate compression fractures at T2 and T9 are incompletely healed. 2. Remote T12 compression fracture is stable following vertebral augmentation. 3. Remote L3 superior endplate fracture. 4. Grade 1 anterolisthesis at L4-5 with moderate central and mild to moderate foraminal stenosis bilaterally. 5. Mild bilateral foraminal narrowing at T8-9 secondary to facet disease. 6. Broad-based disc protrusion with moderate central canal stenosis and subtle cord signal abnormality at C5-6.  She is here today for kyphoplasty of T-9  She is NPO and she does not take blood thinners  Past Medical History  Diagnosis Date  . Aortic regurgitation   . HTN (hypertension)   . Hot flash not due to menopause 12/23/2011  . Neuropathy ([place]) 12/23/2011  . DCIS (ductal carcinoma in situ) 08/04/2012  . Breast cancer ([place])   . Multiple myeloma   . Depression 09/05/2014  . Spinal fracture of T12 vertebra ([facility])     vertebral fx  . Coronary artery disease   . Hypothyroidism 10/18/2014  . Multiple myeloma ([place]) 10/11/2015    Past Surgical History  Procedure Laterality Date  . Lymph node excision - left groin    . Incisional hernia repair       Allergies: Review of patient's allergies indicates no known allergies.  Medications: Prior to Admission medications   Medication Sig Start Date End Date Taking? Authorizing Provider  aspirin 325 MG EC tablet Take 325 mg by mouth daily.   Yes Historical Provider, MD  cholecalciferol (VITAMIN D) 1000 UNITS tablet Take 1,000 Units by mouth daily. 05/01/13  Yes Heath Lark, MD  diphenhydramine-acetaminophen (TYLENOL PM) 25-500 MG TABS Take 1 tablet by mouth at bedtime as needed (Sleep). Reported on 10/17/2015   Yes Historical Provider, MD  gabapentin (NEURONTIN) 300 MG capsule TAKE ONE CAPSULE BY MOUTH THREE TIMES DAILY Patient taking differently: TAKE ONE CAPSULE BY MOUTH every evening 11/05/14  Yes Heath Lark, MD  levothyroxine (SYNTHROID, LEVOTHROID) 50 MCG tablet Take 50 mcg by mouth daily before breakfast.   Yes Historical Provider, MD  mirtazapine (REMERON) 7.5 MG tablet TAKE ONE TABLET BY MOUTH NIGHTLY AT BEDTIME 06/25/15  Yes Heath Lark, MD  Multiple Vitamin (MULTIVITAMIN WITH MINERALS) TABS tablet Take 1 tablet by mouth daily.   Yes Historical Provider, MD  oxyCODONE-acetaminophen (PERCOCET/ROXICET) 5-325 MG tablet Take 2 tablets by mouth every 6 (six) hours as needed for severe pain. Patient taking differently: Take 1 tablet by mouth daily as needed for severe pain.  10/11/15  Yes Heath Lark, MD  Polyvinyl Alcohol-Povidone (REFRESH OP) Place 2 drops into both eyes 2 (two) times daily as needed (dry eyes).   Yes Historical Provider, MD  traMADol (ULTRAM) 50 MG tablet Take 1 tablet (50 mg total) by mouth every 6 (six) hours as  needed for moderate pain. Patient taking differently: Take 50 mg by mouth 3 (three) times daily.  10/21/15  Yes Heath Lark, MD  valsartan (DIOVAN) 160 MG tablet Take 1 tablet (160 mg total) by mouth daily. 10/23/15  Yes Eileen Stanford, PA-C  loperamide (IMODIUM A-D) 2 MG tablet Take 2 mg by mouth as needed for diarrhea or loose stools (as directed.). Reported on  10/17/2015    Historical Provider, MD     Family History  Problem Relation Age of Onset  . Heart failure Mother   . Stroke Father     Social History   Social History  . Marital Status: Married    Spouse Name: N/A  . Number of Children: N/A  . Years of Education: N/A   Social History Main Topics  . Smoking status: Never Smoker   . Smokeless tobacco: Never Used  . Alcohol Use: No  . Drug Use: No  . Sexual Activity: No   Other Topics Concern  . None   Social History Narrative    Review of Systems: A 12 point ROS discussed  Review of Systems  Constitutional: Positive for activity change. Negative for fever, chills, appetite change and fatigue.  HENT: Negative.   Respiratory: Negative for cough and shortness of breath.   Cardiovascular: Negative for chest pain.  Gastrointestinal: Negative for nausea, vomiting and abdominal pain.  Genitourinary: Negative.   Musculoskeletal: Negative.   Skin: Negative.   Neurological: Positive for dizziness.  Hematological: Negative.     Vital Signs: BP 147/60 mmHg  Temp(Src) 98.4 F (36.9 C) (Oral)  Resp 16  Ht _0  (1.626 m)  Wt 115 lb (52.164 kg)  BMI 19.73 kg/m2  SpO2 99%  Physical Exam  Constitutional: She is oriented to person, place, and time.  Thin, NAD  HENT:  Head: Normocephalic and atraumatic.  Eyes: EOM are normal.  Neck: Normal range of motion. Neck supple.  Cardiovascular: Regular rhythm and normal heart sounds.   No murmur heard. Bradycardia  Pulmonary/Chest: Effort normal and breath sounds normal.  Abdominal: Soft. Bowel sounds are normal. She exhibits no distension.  Neurological: She is alert and oriented to person, place, and time.  Skin: Skin is warm and dry.  Psychiatric: She has a normal mood and affect. Her behavior is normal. Judgment and thought content normal.  Vitals reviewed.   Mallampati Score:  MD Evaluation Airway: WNL Heart: WNL Abdomen: WNL Chest/ Lungs: WNL ASA  Classification:  3 Mallampati/Airway Score: Two  Imaging: Mr Total Spine Mets Screening  10/20/2015  CLINICAL DATA:  Multiple myeloma. Vertebral compression fracture. Severe back pain extending at left lower posterior rib. EXAM: MRI TOTAL SPINE WITHOUT CONTRAST TECHNIQUE: Multisequence MR imaging of the spine from the cervical spine to the sacrum was performed prior to and following IV contrast administration for evaluation of spinal metastatic disease. COMPARISON:  MRI of the thoracic spine 07/24/2014 is present. FINDINGS: Cervical Findings: Diffuse heterogeneous marrow signal is compatible with the given diagnosis of multiple myeloma. Degenerative anterolisthesis at C4-5 and slight retrolisthesis at C5-6 is stable. Endplate degenerative changes at C5-6 demonstrate some progression. There is slight increased intrinsic cord signal at C5-6 and posterior to the C6 vertebral body suggesting edema or myelomalacia. Cord size is normal. Limited axial imaging was performed secondary to the total spine protocol. The patient would not tolerate additional imaging. C5-6: A right paramedian disc protrusion effaces the ventral CSF the foramina are patent. No significant foraminal stenosis is present. Thoracic Findings: Cord signal  is within normal limits throughout the thoracic spinal cord. A new superior endplate compression fracture is present at T9. There is marrow signal change along the superior endplate. 30% loss of height is evident relative to the previous study. There is also an incompletely healed fracture involving the superior endplate of T2. 09% loss of height is evident compare to the prior study. There is no retropulsed bone. Osseous foraminal narrowing at T1-2 is stable. Marrow signal is diffusely heterogeneous without significant change otherwise. Bilateral foraminal narrowing is evident at T8-9 due to facet hypertrophy. This has progressed since the prior exam. The T12 fracture is stable following vertebral augmentation.  There is slight retropulsed bone at T12 with partial effacement of the ventral CSF but no significant central canal stenosis. Lumbar Findings: A remote L3 superior endplate compression fracture is noted. Grade 1 anterolisthesis is present at L4-5. Moderate facet hypertrophy and a broad-based disc protrusion at L4-5 leads to moderate central canal stenosis. Mild to moderate foraminal narrowing is present bilaterally. The lumbar foramina are otherwise widely patent. IMPRESSION: 1. Acute/subacute superior endplate compression fractures at T2 and T9 are incompletely healed. 2. Remote T12 compression fracture is stable following vertebral augmentation. 3. Remote L3 superior endplate fracture. 4. Grade 1 anterolisthesis at L4-5 with moderate central and mild to moderate foraminal stenosis bilaterally. 5. Mild bilateral foraminal narrowing at T8-9 secondary to facet disease. 6. Broad-based disc protrusion with moderate central canal stenosis and subtle cord signal abnormality at C5-6. Electronically Signed   By: San Morelle M.D.   On: 10/20/2015 18:20    Labs:  CBC:  Recent Labs  04/11/15 1340 05/09/15 1210 09/19/15 1547 11/05/15 0700  WBC 2.9* 3.8* 3.3* 3.4*  HGB 10.7* 11.4* 11.2* 11.4*  HCT 31.6* 34.6* 33.4* 34.2*  PLT 195 223 180 211    COAGS:  Recent Labs  05/09/15 1210  INR 0.96    BMP:  Recent Labs  11/22/14 1304 01/10/15 1359 04/11/15 1340 09/19/15 1547  NA 136 137 138 137  K 4.4 3.8 3.8 4.6  CO2 _0 GLUCOSE 79 94 105 73  BUN 14.4 13.4 13.9 20.2  CALCIUM 8.1* 8.8 8.9 9.3  CREATININE 0.7 0.7 0.7 1.0    LIVER FUNCTION TESTS:  Recent Labs  11/22/14 1304 01/10/15 1359 04/11/15 1340 09/19/15 1540 09/19/15 1547  BILITOT 0.40 0.44 0.55  --  0.38  AST _1 --  31  ALT _2 --  21  ALKPHOS 61 65 67  --  69  PROT 6.2* 5.8* 6.1* 7.2 7.7  ALBUMIN 3.3* 3.1* 3.5  --  3.7    TUMOR MARKERS: No results for input(s): AFPTM, CEA, CA199, CHROMGRNA  in the last 8760 hours.  Assessment and Plan:  Multiple myeloma with history of vertebral fracutures.  Most recent T-9  Proceed with Kyphoplasty/vertebroplasty T-9 by Dr. Estanislado Pandy today.  Risks and Benefits discussed with the patient including, but not limited to education regarding the natural healing process of compression fractures without intervention, bleeding, infection, cement migration which may cause spinal cord damage, paralysis, pulmonary embolism or even death.  All of the patient's questions were answered, patient is agreeable to proceed. Consent signed and in chart.  Electronically Signed: Murrell Redden PA-C 11/05/2015, 8:15 AM   I spent a total of  15 Minutes in face to face in clinical consultation, greater than 50% of which was counseling/coordinating care for Kyphoplasty

## 2015-11-07 ENCOUNTER — Other Ambulatory Visit (HOSPITAL_COMMUNITY): Payer: Self-pay | Admitting: Interventional Radiology

## 2015-11-07 DIAGNOSIS — IMO0002 Reserved for concepts with insufficient information to code with codable children: Secondary | ICD-10-CM

## 2015-11-08 ENCOUNTER — Other Ambulatory Visit (HOSPITAL_COMMUNITY): Payer: Medicare Other

## 2015-11-08 NOTE — Discharge Instructions (Signed)
° °  KYPHOPLASTY/VERTEBROPLASTY DISCHARGE INSTRUCTIONS  Medications:     xxx Resume all home medications as before procedure.       Resume your (aspirin/Plavix/Coumadin) on 5/3.                  Continue your pain medications as prescribed as needed.  Over the next 3-5 days, decrease your pain medication as tolerated.  Over the counter medications (i.e. Tylenol, ibuprofen, and aleve) may be substituted once severe/moderate pain symptoms have subsided.   Wound Care: - Bandages may be removed the day following your procedure.  You may get your incision wet once bandages are removed.  Bandaids may be used to cover the incisions until scab formation.  Topical ointments are optional.  - If you develop a fever greater than 101 degrees, have increased skin redness at the incision sites or pus-like oozing from incisions occurring within 1 week of the procedure, contact radiology at [phone] or [phone].  - Ice pack to back for 15-20 minutes 2-3 time per day for first 2-3 days post procedure.  The ice will expedite muscle healing and help with the pain from the incisions.   Activity: - Bedrest today with limited activity for 24 hours post procedure.  - No driving for 48 hours.  - Increase your activity as tolerated after bedrest (with assistance if necessary).  - Refrain from any strenuous activity or heavy lifting (greater than 10 lbs.).   Follow up: - Contact radiology at [phone] or [phone] if any questions/concerns.  - A physician assistant from radiology will contact you in approximately 1 week.  - If a biopsy was performed at the time of your procedure, your referring physician should receive the results in usually 2-3 days.

## 2015-11-11 ENCOUNTER — Telehealth: Payer: Self-pay | Admitting: *Deleted

## 2015-11-11 ENCOUNTER — Ambulatory Visit: Payer: Medicare Other | Admitting: Physical Therapy

## 2015-11-11 DIAGNOSIS — C9000 Multiple myeloma not having achieved remission: Secondary | ICD-10-CM

## 2015-11-11 DIAGNOSIS — IMO0001 Reserved for inherently not codable concepts without codable children: Secondary | ICD-10-CM

## 2015-11-11 DIAGNOSIS — M129 Arthropathy, unspecified: Secondary | ICD-10-CM

## 2015-11-11 DIAGNOSIS — M4850XS Collapsed vertebra, not elsewhere classified, site unspecified, sequela of fracture: Secondary | ICD-10-CM

## 2015-11-11 MED ORDER — TRAMADOL HCL 50 MG PO TABS: 50.0000 mg | ORAL_TABLET | Freq: Four times a day (QID) | ORAL | Status: DC | PRN

## 2015-11-11 NOTE — Telephone Encounter (Signed)
90 tabs pls

## 2015-11-11 NOTE — Telephone Encounter (Signed)
Pt requests refill on Tramadol and requests increase to be able to take 2 tablets.  She is not taking oxycodone but says 2 tramadol works for her pain.

## 2015-11-13 ENCOUNTER — Telehealth: Payer: Self-pay | Admitting: Cardiovascular Disease

## 2015-11-13 NOTE — Telephone Encounter (Signed)
ERROR

## 2015-11-15 ENCOUNTER — Telehealth: Payer: Self-pay | Admitting: *Deleted

## 2015-11-15 ENCOUNTER — Ambulatory Visit: Payer: Medicare Other | Admitting: Cardiovascular Disease

## 2015-11-15 NOTE — Telephone Encounter (Signed)
Pt LVM for Dr. Alvy Bimler.   Wants Dr. Alvy Bimler to be aware that she continues to struggle with Depression, but she does not want to talk about it in front of her daughter who will be with her on Monday's visit.  She just wants Dr. Alvy Bimler to be aware of the depression but not discuss it in front of her daughter please.

## 2015-11-18 ENCOUNTER — Encounter: Payer: Self-pay | Admitting: Hematology and Oncology

## 2015-11-18 ENCOUNTER — Ambulatory Visit (HOSPITAL_BASED_OUTPATIENT_CLINIC_OR_DEPARTMENT_OTHER): Payer: Medicare Other | Admitting: Hematology and Oncology

## 2015-11-18 VITALS — BP 136/52 | HR 72 | Temp 97.3°F | Resp 18 | Ht 64.0 in | Wt 115.0 lb

## 2015-11-18 DIAGNOSIS — F329 Major depressive disorder, single episode, unspecified: Secondary | ICD-10-CM | POA: Diagnosis not present

## 2015-11-18 DIAGNOSIS — IMO0001 Reserved for inherently not codable concepts without codable children: Secondary | ICD-10-CM

## 2015-11-18 DIAGNOSIS — F32A Depression, unspecified: Secondary | ICD-10-CM

## 2015-11-18 DIAGNOSIS — C9001 Multiple myeloma in remission: Secondary | ICD-10-CM | POA: Diagnosis present

## 2015-11-18 DIAGNOSIS — M4850XS Collapsed vertebra, not elsewhere classified, site unspecified, sequela of fracture: Secondary | ICD-10-CM | POA: Diagnosis not present

## 2015-11-18 NOTE — Progress Notes (Signed)
[facility] OFFICE PROGRESS NOTE  Patient Care Team: Thressa Sheller, MD as PCP - General (Internal Medicine) Thressa Sheller, MD (Internal Medicine) Renella Cunas, MD as Attending Physician (Cardiology) Heath Lark, MD as Consulting Physician (Hematology and Oncology) Luanne Bras, MD as Consulting Physician (Interventional Radiology)  SUMMARY OF ONCOLOGIC HISTORY: Oncology History   The     Multiple myeloma in remission [facility])   11/12/2008 Imaging Skeletal survey showed lytic lesion in the right femur compatible with  myeloma. There were Questionable skull lesions   11/15/2008 Bone Marrow Biopsy BONE MARROW ASPIRATE AND BIOPSY: showed NORMOCELLULAR MARROW FOR AGE WITH PLASMA CELL DYSCRASIA  (PLASMA CELLS 25%). 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 Subsequently she was placed on Revlimid & Dexamethasone alone   05/12/2013 Bone Marrow Biopsy Bone marrow biopsy showed persistent plasma cells. Blood work confirmed VGPR status   06/11/2014 Tumor Marker Bloodwork show disease relapse   06/26/2014 Procedure She has placement of port   07/05/2014 - 07/26/2014 Chemotherapy She received Elotuzumab and Revlimid. Rx was discontinued with elotuzumab per patient preference   08/01/2014 - 08/05/2014 Hospital Admission She was admitted to the hospital for kyphoplasty and pain management after traumatic compression fracture   08/23/2014 - 04/18/2015 Chemotherapy She was placed on dexamethasone & Revlimid   04/11/2015 Tumor Marker Blood work showed VGPR. Treatment was discontinued and she is maintained on Zometa only   10/20/2015 Imaging MRI spine showed acute/subacute superior endplate compression fractures at T2 and T9 are incompletely healed.    11/05/2015 Procedure Status post vertebral body augmentation using balloon kyphoplasty at T9 as  described without event    INTERVAL HISTORY: Please see below for problem oriented charting. She returns for further follow-up. Since kyphoplasty, her pain is better controlled. She is taking Tramadol as needed and rarely needs to take Percocet. She is functional. She continues to be depressed but denies suicidal ideation. She denies further weight loss.  REVIEW OF SYSTEMS:   Constitutional: Denies fevers, chills or abnormal weight loss Eyes: Denies blurriness of vision Ears, nose, mouth, throat, and face: Denies mucositis or sore throat Respiratory: Denies cough, dyspnea or wheezes Cardiovascular: Denies palpitation, chest discomfort or lower extremity swelling Gastrointestinal:  Denies nausea, heartburn or change in bowel habits Skin: Denies abnormal skin rashes Lymphatics: Denies new lymphadenopathy or easy bruising Neurological:Denies numbness, tingling or new weaknesses Behavioral/Psych: Mood is stable, no new changes  All other systems were reviewed with the patient and are negative.  I have reviewed the past medical history, past surgical history, social history and family history with the patient and they are unchanged from previous note.  ALLERGIES:  has No Known Allergies.  MEDICATIONS:  Current Outpatient Prescriptions  Medication Sig Dispense Refill  . aspirin 325 MG EC tablet Take 325 mg by mouth daily.    . cholecalciferol (VITAMIN D) 1000 UNITS tablet Take 1,000 Units by mouth daily.    [name] gabapentin (NEURONTIN) 300 MG capsule TAKE ONE CAPSULE BY MOUTH THREE TIMES DAILY (Patient taking differently: TAKE ONE CAPSULE BY MOUTH every evening) 90 capsule 3  . levothyroxine (SYNTHROID, LEVOTHROID) 50 MCG tablet Take 50 mcg by mouth daily before breakfast.    . mirtazapine (REMERON) 7.5 MG tablet TAKE ONE TABLET BY MOUTH NIGHTLY AT BEDTIME 30 tablet 3  . Multiple Vitamin (MULTIVITAMIN WITH MINERALS) TABS tablet Take 1 tablet by mouth daily.    [name]  oxyCODONE-acetaminophen  (PERCOCET/ROXICET) 5-325 MG tablet Take 2 tablets by mouth every 6 (six) hours as needed for severe pain. (Patient taking differently: Take 1 tablet by mouth daily as needed for severe pain. ) 60 tablet 0  . Polyvinyl Alcohol-Povidone (REFRESH OP) Place 2 drops into both eyes 2 (two) times daily as needed (dry eyes).    . traMADol (ULTRAM) 50 MG tablet Take 1 tablet (50 mg total) by mouth every 6 (six) hours as needed for moderate pain (one to two tablets every 6 hrs as needed.). 90 tablet 0  . valsartan (DIOVAN) 160 MG tablet Take 1 tablet (160 mg total) by mouth daily. 30 tablet 3  . diphenhydramine-acetaminophen (TYLENOL PM) 25-500 MG TABS Take 1 tablet by mouth at bedtime as needed (Sleep). Reported on 11/18/2015    . loperamide (IMODIUM A-D) 2 MG tablet Take 2 mg by mouth as needed for diarrhea or loose stools (as directed.). Reported on 11/18/2015     No current facility-administered medications for this visit.    PHYSICAL EXAMINATION: ECOG PERFORMANCE STATUS: 1 - Symptomatic but completely ambulatory  Filed Vitals:   11/18/15 1219  BP: 136/52  Pulse: 72  Temp: 97.3 F (36.3 C)  Resp: 18   Filed Weights   11/18/15 1219  Weight: 115 lb (52.164 kg)    GENERAL:alert, no distress and comfortable SKIN: skin color, texture, turgor are normal, no rashes or significant lesions EYES: normal, Conjunctiva are pink and non-injected, sclera clear Musculoskeletal:no cyanosis of digits and no clubbing  NEURO: alert & oriented x 3 with fluent speech, no focal motor/sensory deficits  LABORATORY DATA:  I have reviewed the data as listed    Component Value Date/Time   NA 136 11/05/2015 0700   NA 137 09/19/2015 1547   K 3.9 11/05/2015 0700   K 4.6 09/19/2015 1547   CL 100* 11/05/2015 0700   CL 103 11/25/2012 1328   CO2 27 11/05/2015 0700   CO2 28 09/19/2015 1547   GLUCOSE 78 11/05/2015 0700   GLUCOSE 73 09/19/2015 1547   GLUCOSE 91 11/25/2012 1328   BUN 19 11/05/2015 0700   BUN 20.2  09/19/2015 1547   CREATININE 0.88 11/05/2015 0700   CREATININE 1.0 09/19/2015 1547   CALCIUM 9.6 11/05/2015 0700   CALCIUM 9.3 09/19/2015 1547   PROT 7.7 09/19/2015 1547   PROT 7.2 09/19/2015 1540   PROT 5.7* 09/21/2014 0725   ALBUMIN 3.7 09/19/2015 1547   ALBUMIN 3.0* 09/21/2014 0725   AST 31 09/19/2015 1547   AST 22 09/21/2014 0725   ALT 21 09/19/2015 1547   ALT 17 09/21/2014 0725   ALKPHOS 69 09/19/2015 1547   ALKPHOS 43 09/21/2014 0725   BILITOT 0.38 09/19/2015 1547   BILITOT 0.7 09/21/2014 0725   GFRNONAA 57* 11/05/2015 0700   GFRAA >60 11/05/2015 0700    No results found for: SPEP, UPEP  Lab Results  Component Value Date   WBC 3.4* 11/05/2015   NEUTROABS 1.4* 09/19/2015   HGB 11.4* 11/05/2015   HCT 34.2* 11/05/2015   MCV 101.8* 11/05/2015   PLT 211 11/05/2015      Chemistry      Component Value Date/Time   NA 136 11/05/2015 0700   NA 137 09/19/2015 1547   K 3.9 11/05/2015 0700   K 4.6 09/19/2015 1547   CL 100* 11/05/2015 0700   CL 103 11/25/2012 1328   CO2 27 11/05/2015 0700   CO2 28 09/19/2015 1547   BUN 19 11/05/2015 0700  BUN 20.2 09/19/2015 1547   CREATININE 0.88 11/05/2015 0700   CREATININE 1.0 09/19/2015 1547      Component Value Date/Time   CALCIUM 9.6 11/05/2015 0700   CALCIUM 9.3 09/19/2015 1547   ALKPHOS 69 09/19/2015 1547   ALKPHOS 43 09/21/2014 0725   AST 31 09/19/2015 1547   AST 22 09/21/2014 0725   ALT 21 09/19/2015 1547   ALT 17 09/21/2014 0725   BILITOT 0.38 09/19/2015 1547   BILITOT 0.7 09/21/2014 0725       ASSESSMENT & PLAN:  Multiple myeloma in remission ([place]) The patient had significant complication related to recurrent compression fracture. Her recent blood work recently showed she has early signs of relapse but not to the degree that would require treatment Continue supportive care with pain management, calcium with vitamin D I plan to see her back next month for further review of blood work. She has received IV  Zometa in March, the next dose will be due in June. She is concerned about paying for her treatment and I gave her resources to speak with the financial counselor and possibly obtain financial support from the Leukemia and [place].  Vertebral compression fracture Since repeat kyphoplasty, her pain is better controlled. She is using tramadol on most days only. I recommend high-dose vitamin D supplement and continue conservative management.  Depression The patient continued to be clinically depressed but denies suicidal ideation. Her mood appears to be better now that her pain is better controlled. Recommend she continues on Remeron.   No orders of the defined types were placed in this encounter.   All questions were answered. The patient knows to call the clinic with any problems, questions or concerns. No barriers to learning was detected. I spent 15 minutes counseling the patient face to face. The total time spent in the appointment was 20 minutes and more than 50% was on counseling and review of test results     [facility], [place], MD 11/18/2015 1:14 PM

## 2015-11-18 NOTE — Assessment & Plan Note (Signed)
Since repeat kyphoplasty, her pain is better controlled. She is using tramadol on most days only. I recommend high-dose vitamin D supplement and continue conservative management.

## 2015-11-18 NOTE — Assessment & Plan Note (Signed)
The patient continued to be clinically depressed but denies suicidal ideation. Her mood appears to be better now that her pain is better controlled. Recommend she continues on Remeron.

## 2015-11-18 NOTE — Assessment & Plan Note (Signed)
The patient had significant complication related to recurrent compression fracture. Her recent blood work recently showed she has early signs of relapse but not to the degree that would require treatment Continue supportive care with pain management, calcium with vitamin D I plan to see her back next month for further review of blood work. She has received IV Zometa in March, the next dose will be due in June. She is concerned about paying for her treatment and I gave her resources to speak with the financial counselor and possibly obtain financial support from the Leukemia and [place].

## 2015-11-22 ENCOUNTER — Telehealth: Payer: Self-pay | Admitting: *Deleted

## 2015-11-22 NOTE — Telephone Encounter (Signed)
Pt states the bill she received denying Zometa payment was a mistake. She received a call from billing stating it is covered, so she is OK to take it as Dr Alvy Bimler suggests.

## 2015-11-26 ENCOUNTER — Other Ambulatory Visit: Payer: Self-pay

## 2015-11-26 ENCOUNTER — Ambulatory Visit (HOSPITAL_BASED_OUTPATIENT_CLINIC_OR_DEPARTMENT_OTHER): Payer: Medicare Other

## 2015-11-26 DIAGNOSIS — I38 Endocarditis, valve unspecified: Secondary | ICD-10-CM | POA: Diagnosis not present

## 2015-11-26 DIAGNOSIS — E039 Hypothyroidism, unspecified: Secondary | ICD-10-CM | POA: Diagnosis not present

## 2015-11-26 DIAGNOSIS — I083 Combined rheumatic disorders of mitral, aortic and tricuspid valves: Secondary | ICD-10-CM | POA: Diagnosis not present

## 2015-11-27 ENCOUNTER — Ambulatory Visit (HOSPITAL_COMMUNITY)
Admission: RE | Admit: 2015-11-27 | Discharge: 2015-11-27 | Disposition: A | Discharge status: Home / Self Care | Payer: Medicare Other | Source: Ambulatory Visit | Attending: Interventional Radiology | Admitting: Interventional Radiology

## 2015-11-27 DIAGNOSIS — IMO0002 Reserved for concepts with insufficient information to code with codable children: Secondary | ICD-10-CM

## 2015-11-27 DIAGNOSIS — I083 Combined rheumatic disorders of mitral, aortic and tricuspid valves: Secondary | ICD-10-CM | POA: Insufficient documentation

## 2015-11-27 DIAGNOSIS — E039 Hypothyroidism, unspecified: Secondary | ICD-10-CM | POA: Insufficient documentation

## 2015-11-28 ENCOUNTER — Telehealth: Payer: Self-pay | Admitting: *Deleted

## 2015-11-28 DIAGNOSIS — C9000 Multiple myeloma not having achieved remission: Secondary | ICD-10-CM

## 2015-11-28 DIAGNOSIS — M129 Arthropathy, unspecified: Secondary | ICD-10-CM

## 2015-11-28 DIAGNOSIS — IMO0001 Reserved for inherently not codable concepts without codable children: Secondary | ICD-10-CM

## 2015-11-28 DIAGNOSIS — M4850XS Collapsed vertebra, not elsewhere classified, site unspecified, sequela of fracture: Secondary | ICD-10-CM

## 2015-11-28 MED ORDER — TRAMADOL HCL 50 MG PO TABS: 50.0000 mg | ORAL_TABLET | Freq: Four times a day (QID) | ORAL | Status: DC | PRN

## 2015-11-28 NOTE — Telephone Encounter (Signed)
Pt requests refill on Tramadol sent to new pharmacy, [facility] on [address].   Refill called into [facility].

## 2015-12-02 ENCOUNTER — Other Ambulatory Visit: Payer: Self-pay | Admitting: Hematology and Oncology

## 2015-12-16 ENCOUNTER — Ambulatory Visit (HOSPITAL_BASED_OUTPATIENT_CLINIC_OR_DEPARTMENT_OTHER): Payer: Medicare Other | Admitting: Hematology and Oncology

## 2015-12-16 ENCOUNTER — Telehealth: Payer: Self-pay | Admitting: Hematology and Oncology

## 2015-12-16 ENCOUNTER — Ambulatory Visit (HOSPITAL_BASED_OUTPATIENT_CLINIC_OR_DEPARTMENT_OTHER): Payer: Medicare Other

## 2015-12-16 ENCOUNTER — Ambulatory Visit (HOSPITAL_COMMUNITY)
Admission: RE | Admit: 2015-12-16 | Discharge: 2015-12-16 | Disposition: A | Discharge status: Home / Self Care | Payer: Medicare Other | Source: Ambulatory Visit | Attending: Hematology and Oncology | Admitting: Hematology and Oncology

## 2015-12-16 ENCOUNTER — Telehealth: Payer: Self-pay | Admitting: *Deleted

## 2015-12-16 VITALS — BP 136/51 | HR 72 | Temp 98.1°F | Resp 18 | Ht 64.0 in | Wt 114.7 lb

## 2015-12-16 DIAGNOSIS — M129 Arthropathy, unspecified: Secondary | ICD-10-CM

## 2015-12-16 DIAGNOSIS — D61810 Antineoplastic chemotherapy induced pancytopenia: Secondary | ICD-10-CM

## 2015-12-16 DIAGNOSIS — C9001 Multiple myeloma in remission: Secondary | ICD-10-CM | POA: Diagnosis present

## 2015-12-16 DIAGNOSIS — F32A Depression, unspecified: Secondary | ICD-10-CM

## 2015-12-16 DIAGNOSIS — C9000 Multiple myeloma not having achieved remission: Secondary | ICD-10-CM

## 2015-12-16 DIAGNOSIS — IMO0001 Reserved for inherently not codable concepts without codable children: Secondary | ICD-10-CM

## 2015-12-16 DIAGNOSIS — T451X5A Adverse effect of antineoplastic and immunosuppressive drugs, initial encounter: Secondary | ICD-10-CM

## 2015-12-16 DIAGNOSIS — G8929 Other chronic pain: Secondary | ICD-10-CM

## 2015-12-16 DIAGNOSIS — M4850XS Collapsed vertebra, not elsewhere classified, site unspecified, sequela of fracture: Secondary | ICD-10-CM

## 2015-12-16 DIAGNOSIS — F329 Major depressive disorder, single episode, unspecified: Secondary | ICD-10-CM

## 2015-12-16 DIAGNOSIS — M549 Dorsalgia, unspecified: Secondary | ICD-10-CM

## 2015-12-16 LAB — CBC WITH DIFFERENTIAL/PLATELET
BASO%: 0.5 % (ref 0.0–2.0)
BASOS ABS: 0 10*3/uL (ref 0.0–0.1)
EOS%: 0.2 % (ref 0.0–7.0)
Eosinophils Absolute: 0 10*3/uL (ref 0.0–0.5)
HEMATOCRIT: 31.6 % — AB (ref 34.8–46.6)
HGB: 10.5 g/dL — ABNORMAL LOW (ref 11.6–15.9)
LYMPH%: 32.9 % (ref 14.0–49.7)
MCH: 34.3 pg — AB (ref 25.1–34.0)
MCHC: 33.1 g/dL (ref 31.5–36.0)
MCV: 103.6 fL — ABNORMAL HIGH (ref 79.5–101.0)
MONO#: 0.1 10*3/uL (ref 0.1–0.9)
MONO%: 3.6 % (ref 0.0–14.0)
NEUT#: 1.7 10*3/uL (ref 1.5–6.5)
NEUT%: 62.8 % (ref 38.4–76.8)
Platelets: 172 10*3/uL (ref 145–400)
RBC: 3.05 10*6/uL — ABNORMAL LOW (ref 3.70–5.45)
RDW: 15.6 % — ABNORMAL HIGH (ref 11.2–14.5)
WBC: 2.7 10*3/uL — ABNORMAL LOW (ref 3.9–10.3)
lymph#: 0.9 10*3/uL (ref 0.9–3.3)

## 2015-12-16 LAB — COMPREHENSIVE METABOLIC PANEL
ALT: 17 U/L (ref 0–55)
AST: 26 U/L (ref 5–34)
Albumin: 3.5 g/dL (ref 3.5–5.0)
Alkaline Phosphatase: 66 U/L (ref 40–150)
Anion Gap: 4 mEq/L (ref 3–11)
BUN: 21.9 mg/dL (ref 7.0–26.0)
CALCIUM: 9.5 mg/dL (ref 8.4–10.4)
CHLORIDE: 101 meq/L (ref 98–109)
CO2: 29 mEq/L (ref 22–29)
Creatinine: 0.8 mg/dL (ref 0.6–1.1)
EGFR: 62 mL/min/{1.73_m2} — AB (ref >90)
Glucose: 106 mg/dl (ref 70–140)
POTASSIUM: 4.5 meq/L (ref 3.5–5.1)
SODIUM: 134 meq/L — AB (ref 136–145)
Total Bilirubin: 0.4 mg/dL (ref 0.20–1.20)
Total Protein: 8.6 g/dL — ABNORMAL HIGH (ref 6.4–8.3)

## 2015-12-16 MED ORDER — TRAMADOL HCL 50 MG PO TABS: 100.0000 mg | ORAL_TABLET | Freq: Four times a day (QID) | ORAL | Status: DC | PRN

## 2015-12-16 MED ORDER — CYCLOBENZAPRINE HCL 5 MG PO TABS: 5.0000 mg | ORAL_TABLET | Freq: Three times a day (TID) | ORAL | Status: DC | PRN

## 2015-12-16 NOTE — Progress Notes (Signed)
[place] OFFICE PROGRESS NOTE  Patient Care Team: Thressa Sheller, MD as PCP - General (Internal Medicine) Thressa Sheller, MD (Internal Medicine) Renella Cunas, MD as Attending Physician (Cardiology) Heath Lark, MD as Consulting Physician (Hematology and Oncology) Luanne Bras, MD as Consulting Physician (Interventional Radiology)  SUMMARY OF ONCOLOGIC HISTORY: Oncology History   The     Multiple myeloma in remission [facility])   11/12/2008 Imaging Skeletal survey showed lytic lesion in the right femur compatible with  myeloma. There were Questionable skull lesions   11/15/2008 Bone Marrow Biopsy BONE MARROW ASPIRATE AND BIOPSY: showed NORMOCELLULAR MARROW FOR AGE WITH PLASMA CELL DYSCRASIA  (PLASMA CELLS 25%). 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 Subsequently she was placed on Revlimid & Dexamethasone alone   05/12/2013 Bone Marrow Biopsy Bone marrow biopsy showed persistent plasma cells. Blood work confirmed VGPR status   06/11/2014 Tumor Marker Bloodwork show disease relapse   06/26/2014 Procedure She has placement of port   07/05/2014 - 07/26/2014 Chemotherapy She received Elotuzumab and Revlimid. Rx was discontinued with elotuzumab per patient preference   08/01/2014 - 08/05/2014 Hospital Admission She was admitted to the hospital for kyphoplasty and pain management after traumatic compression fracture   08/23/2014 - 04/18/2015 Chemotherapy She was placed on dexamethasone & Revlimid   04/11/2015 Tumor Marker Blood work showed VGPR. Treatment was discontinued and she is maintained on Zometa only   10/20/2015 Imaging MRI spine showed acute/subacute superior endplate compression fractures at T2 and T9 are incompletely healed.    11/05/2015 Procedure Status post vertebral body augmentation using balloon kyphoplasty at T9 as  described without event    INTERVAL HISTORY: Please see below for problem oriented charting. She is seen urgently because of acute on chronic pain. She started gardening 4 days ago and has acute onset of lower back pain, spread across her midback including muscle spasm bilaterally. On occasion, she rated her pain at 9 out of 10 pain. She has been using 100 mg of tramadol as needed which seems to help. Without pain medicine, she rated her pain at 9 out of 10 pain. She is fairly resistant to use prescription oxycodone/Percocet even though it works with her pain but it causes her to have significant mood disturbance. She appears mildly tearful but denies suicidal ideation REVIEW OF SYSTEMS:   Constitutional: Denies fevers, chills or abnormal weight loss Eyes: Denies blurriness of vision Ears, nose, mouth, throat, and face: Denies mucositis or sore throat Respiratory: Denies cough, dyspnea or wheezes Cardiovascular: Denies palpitation, chest discomfort or lower extremity swelling Gastrointestinal:  Denies nausea, heartburn or change in bowel habits Skin: Denies abnormal skin rashes Lymphatics: Denies new lymphadenopathy or easy bruising Neurological:Denies numbness, tingling or new weaknesses Behavioral/Psych: Mood is stable, no new changes  All other systems were reviewed with the patient and are negative.  I have reviewed the past medical history, past surgical history, social history and family history with the patient and they are unchanged from previous note.  ALLERGIES:  has No Known Allergies.  MEDICATIONS:  Current Outpatient Prescriptions  Medication Sig Dispense Refill  . aspirin 325 MG EC tablet Take 325 mg by mouth daily.    . cholecalciferol (VITAMIN D) 1000 UNITS tablet Take 1,000 Units by mouth daily.    . cyclobenzaprine (FLEXERIL) 5 MG tablet Take 1 tablet (5 mg total) by mouth 3 (three) times daily as needed for  muscle spasms. 30 tablet 0  . diphenhydramine-acetaminophen  (TYLENOL PM) 25-500 MG TABS Take 1 tablet by mouth at bedtime as needed (Sleep). Reported on 11/18/2015    . gabapentin (NEURONTIN) 300 MG capsule TAKE ONE CAPSULE BY MOUTH THREE TIMES DAILY 90 capsule 3  . levothyroxine (SYNTHROID, LEVOTHROID) 50 MCG tablet Take 50 mcg by mouth daily before breakfast.    . loperamide (IMODIUM A-D) 2 MG tablet Take 2 mg by mouth as needed for diarrhea or loose stools (as directed.). Reported on 11/18/2015    . mirtazapine (REMERON) 7.5 MG tablet TAKE ONE TABLET BY MOUTH NIGHTLY AT BEDTIME 30 tablet 3  . Multiple Vitamin (MULTIVITAMIN WITH MINERALS) TABS tablet Take 1 tablet by mouth daily.    [name] oxyCODONE-acetaminophen (PERCOCET/ROXICET) 5-325 MG tablet Take 2 tablets by mouth every 6 (six) hours as needed for severe pain. (Patient taking differently: Take 1 tablet by mouth daily as needed for severe pain. ) 60 tablet 0  . Polyvinyl Alcohol-Povidone (REFRESH OP) Place 2 drops into both eyes 2 (two) times daily as needed (dry eyes).    . traMADol (ULTRAM) 50 MG tablet Take 2 tablets (100 mg total) by mouth every 6 (six) hours as needed for moderate pain (one to two tablets every 6 hrs as needed.). 90 tablet 0  . valsartan (DIOVAN) 160 MG tablet Take 1 tablet (160 mg total) by mouth daily. 30 tablet 3   No current facility-administered medications for this visit.    PHYSICAL EXAMINATION: ECOG PERFORMANCE STATUS: 1 - Symptomatic but completely ambulatory  Filed Vitals:   12/16/15 1026  BP: 136/51  Pulse: 72  Temp: 98.1 F (36.7 C)  Resp: 18   Filed Weights   12/16/15 1026  Weight: 114 lb 11.2 oz (52.028 kg)    GENERAL:alert, no distress and comfortable SKIN: skin color, texture, turgor are normal, no rashes or significant lesions EYES: normal, Conjunctiva are pink and non-injected, sclera clear OROPHARYNX:no exudate, no erythema and lips, buccal mucosa, and tongue normal  NECK: supple, thyroid normal size, non-tender, without nodularity LYMPH:  no  palpable lymphadenopathy in the cervical, axillary or inguinal LUNGS: clear to auscultation and percussion with normal breathing effort HEART: regular rate & rhythm and no murmurs and no lower extremity edema ABDOMEN:abdomen soft, non-tender and normal bowel sounds Musculoskeletal:no cyanosis of digits and no clubbing . She has significant spasm on clinical examination NEURO: alert & oriented x 3 with fluent speech, no focal motor/sensory deficits  LABORATORY DATA:  I have reviewed the data as listed    Component Value Date/Time   NA 134* 12/16/2015 1115   NA 136 11/05/2015 0700   K 4.5 12/16/2015 1115   K 3.9 11/05/2015 0700   CL 100* 11/05/2015 0700   CL 103 11/25/2012 1328   CO2 29 12/16/2015 1115   CO2 27 11/05/2015 0700   GLUCOSE 106 12/16/2015 1115   GLUCOSE 78 11/05/2015 0700   GLUCOSE 91 11/25/2012 1328   BUN 21.9 12/16/2015 1115   BUN 19 11/05/2015 0700   CREATININE 0.8 12/16/2015 1115   CREATININE 0.88 11/05/2015 0700   CALCIUM 9.5 12/16/2015 1115   CALCIUM 9.6 11/05/2015 0700   PROT 8.6* 12/16/2015 1115   PROT 7.2 09/19/2015 1540   PROT 5.7* 09/21/2014 0725   ALBUMIN 3.5 12/16/2015 1115   ALBUMIN 3.0* 09/21/2014 0725   AST 26 12/16/2015 1115   AST 22 09/21/2014 0725   ALT 17 12/16/2015 1115   ALT 17 09/21/2014 0725   ALKPHOS 66  12/16/2015 1115   ALKPHOS 43 09/21/2014 0725   BILITOT 0.40 12/16/2015 1115   BILITOT 0.7 09/21/2014 0725   GFRNONAA 57* 11/05/2015 0700   GFRAA >60 11/05/2015 0700    No results found for: SPEP, UPEP  Lab Results  Component Value Date   WBC 2.7* 12/16/2015   NEUTROABS 1.7 12/16/2015   HGB 10.5* 12/16/2015   HCT 31.6* 12/16/2015   MCV 103.6* 12/16/2015   PLT 172 12/16/2015      Chemistry      Component Value Date/Time   NA 134* 12/16/2015 1115   NA 136 11/05/2015 0700   K 4.5 12/16/2015 1115   K 3.9 11/05/2015 0700   CL 100* 11/05/2015 0700   CL 103 11/25/2012 1328   CO2 29 12/16/2015 1115   CO2 27 11/05/2015 0700    BUN 21.9 12/16/2015 1115   BUN 19 11/05/2015 0700   CREATININE 0.8 12/16/2015 1115   CREATININE 0.88 11/05/2015 0700      Component Value Date/Time   CALCIUM 9.5 12/16/2015 1115   CALCIUM 9.6 11/05/2015 0700   ALKPHOS 66 12/16/2015 1115   ALKPHOS 43 09/21/2014 0725   AST 26 12/16/2015 1115   AST 22 09/21/2014 0725   ALT 17 12/16/2015 1115   ALT 17 09/21/2014 0725   BILITOT 0.40 12/16/2015 1115   BILITOT 0.7 09/21/2014 0725       RADIOGRAPHIC STUDIES: I have personally reviewed the radiological images as listed and agreed with the findings in the report. Dg Bone Survey Met  12/16/2015  CLINICAL DATA:  Multiple myeloma. EXAM: METASTATIC BONE SURVEY COMPARISON:  02/05/2014. FINDINGS: Increase in number of lytic lesions are noted throughout the skull. Progressive diffuse osteopenia. Diffuse degenerative change. Prior vertebroplasties. Multiple prominent lytic lesions are noted through out both femurs. Tiny lytic lesions in the right humerus cannot be excluded. IMPRESSION: Significant increase in number of lytic lesions are noted throughout the skull. Multiple prominent lytic lesions noted throughout both femurs. Tiny lytic lesions are right humerus cannot be excluded. Findings consistent with progressive changes of multiple myeloma Electronically Signed   By: Marcello Moores  Register   On: 12/16/2015 13:16     ASSESSMENT & PLAN:  Multiple myeloma in remission ([place]) She has new onset of back pain which is related to recent activity. I will order repeat blood work along with x-ray. I will discuss with her next week with test results to consider treatment if blood work shows significant abnormalities. She will be due for Zometa next week  Pancytopenia due to antineoplastic chemotherapy [facility]) This is likely due to her underlying disease. The patient denies recent history of bleeding such as epistaxis, hematuria or hematochezia. She is asymptomatic from the pancytopenia. We will observe for now.        Chronic back pain greater than 3 months duration She has acute on chronic pain with evidence of muscle spasm. This was precipitated by recent gardening activity. I recommend her to use her narcotic prescription which she is fairly reluctant to do so, due to side effects. I also recommend additional muscle relaxant and other conservative measures such as heating pad, massage and lidocaine topical cream as needed  Depression The patient continued to be clinically depressed but denies suicidal ideation. She appears to be somewhat distressed due to recent acute on chronic pain  I will reassess again next week. Recommend she continues on her current antidepressant   No orders of the defined types were placed in this encounter.   All questions  were answered. The patient knows to call the clinic with any problems, questions or concerns. No barriers to learning was detected. I spent 25 minutes counseling the patient face to face. The total time spent in the appointment was 30 minutes and more than 50% was on counseling and review of test results     [facility], [place], MD 12/16/2015 1:19 PM

## 2015-12-16 NOTE — Assessment & Plan Note (Signed)
The patient continued to be clinically depressed but denies suicidal ideation. She appears to be somewhat distressed due to recent acute on chronic pain  I will reassess again next week. Recommend she continues on her current antidepressant

## 2015-12-16 NOTE — Assessment & Plan Note (Signed)
This is likely due to her underlying disease. The patient denies recent history of bleeding such as epistaxis, hematuria or hematochezia. She is asymptomatic from the pancytopenia. We will observe for now.

## 2015-12-16 NOTE — Assessment & Plan Note (Signed)
She has acute on chronic pain with evidence of muscle spasm. This was precipitated by recent gardening activity. I recommend her to use her narcotic prescription which she is fairly reluctant to do so, due to side effects. I also recommend additional muscle relaxant and other conservative measures such as heating pad, massage and lidocaine topical cream as needed

## 2015-12-16 NOTE — Telephone Encounter (Signed)
added apt per pof, spoke with pt confirmed added apt today

## 2015-12-16 NOTE — Telephone Encounter (Signed)
per pof to send pt tolab-MW moved trmt-sch appt-gave pt copyof avs-pt to XRAY today

## 2015-12-16 NOTE — Telephone Encounter (Signed)
Pt reports "excruciating back pain" x 4 days.  She is worried she has re-injured her back.  She also needs refill on Tramadol.  Dr. Alvy Bimler added pt on to see her today at 10 am.  Pt aware of appt and will do her best to get here on time.

## 2015-12-16 NOTE — Telephone Encounter (Signed)
Per staff phone call and POF I have schedueld appts. Scheduler advised of appts.  JMW  

## 2015-12-16 NOTE — Assessment & Plan Note (Signed)
She has new onset of back pain which is related to recent activity. I will order repeat blood work along with x-ray. I will discuss with her next week with test results to consider treatment if blood work shows significant abnormalities. She will be due for Zometa next week

## 2015-12-17 LAB — KAPPA/LAMBDA LIGHT CHAINS
IG KAPPA FREE LIGHT CHAIN: 559.1 mg/L — AB (ref 3.3–19.4)
Ig Lambda Free Light Chain: 8.7 mg/L (ref 5.7–26.3)
Kappa/Lambda FluidC Ratio: 64.26 — ABNORMAL HIGH (ref 0.26–1.65)

## 2015-12-18 LAB — MULTIPLE MYELOMA PANEL, SERUM
ALBUMIN SERPL ELPH-MCNC: 3.8 g/dL (ref 2.9–4.4)
ALPHA 1: 0.3 g/dL (ref 0.0–0.4)
ALPHA2 GLOB SERPL ELPH-MCNC: 0.7 g/dL (ref 0.4–1.0)
Albumin/Glob SerPl: 1 (ref 0.7–1.7)
B-Globulin SerPl Elph-Mcnc: 0.8 g/dL (ref 0.7–1.3)
GAMMA GLOB SERPL ELPH-MCNC: 2.3 g/dL — AB (ref 0.4–1.8)
GLOBULIN, TOTAL: 4.1 g/dL — AB (ref 2.2–3.9)
IGA/IMMUNOGLOBULIN A, SERUM: 23 mg/dL — AB (ref 64–422)
IgM, Qn, Serum: 53 mg/dL (ref 26–217)
M Protein SerPl Elph-Mcnc: 2.1 g/dL — ABNORMAL HIGH
Total Protein: 7.9 g/dL (ref 6.0–8.5)

## 2015-12-19 ENCOUNTER — Ambulatory Visit (HOSPITAL_COMMUNITY): Payer: Medicare Other

## 2015-12-19 ENCOUNTER — Other Ambulatory Visit: Payer: Medicare Other

## 2015-12-19 ENCOUNTER — Ambulatory Visit: Payer: Medicare Other

## 2015-12-23 ENCOUNTER — Telehealth: Payer: Self-pay | Admitting: *Deleted

## 2015-12-23 ENCOUNTER — Telehealth: Payer: Self-pay | Admitting: Hematology and Oncology

## 2015-12-23 NOTE — Telephone Encounter (Signed)
Will get scheduler to call & move appt

## 2015-12-23 NOTE — Telephone Encounter (Signed)
Daughter, Cleophus Molt left message stating patient is "having a lot of pain" and they are wanting the results of recent xray.

## 2015-12-23 NOTE — Telephone Encounter (Signed)
s.w. pt and advised on 6.20 appt....pt ok and aware

## 2015-12-23 NOTE — Telephone Encounter (Signed)
X ray showed no new fractures but disease progression I plan to go through plan of care with her on Thursday as scheduled If the family wants to see me sooner, we can schedule return appt sooner

## 2015-12-23 NOTE — Telephone Encounter (Signed)
Per staff message and POF I have scheduled appts. Advised scheduler of appts and first available given. JMW  

## 2015-12-23 NOTE — Telephone Encounter (Signed)
Daughter states they would like to come in sooner.

## 2015-12-24 ENCOUNTER — Ambulatory Visit (HOSPITAL_BASED_OUTPATIENT_CLINIC_OR_DEPARTMENT_OTHER): Payer: Medicare Other | Admitting: Hematology and Oncology

## 2015-12-24 ENCOUNTER — Ambulatory Visit: Payer: Medicare Other

## 2015-12-24 ENCOUNTER — Other Ambulatory Visit: Payer: Self-pay | Admitting: Hematology and Oncology

## 2015-12-24 ENCOUNTER — Telehealth: Payer: Self-pay | Admitting: Hematology and Oncology

## 2015-12-24 ENCOUNTER — Encounter: Payer: Self-pay | Admitting: Hematology and Oncology

## 2015-12-24 ENCOUNTER — Telehealth: Payer: Self-pay | Admitting: *Deleted

## 2015-12-24 VITALS — BP 136/56 | HR 79 | Temp 98.1°F | Resp 18 | Ht 64.0 in | Wt 115.3 lb

## 2015-12-24 DIAGNOSIS — G620 Drug-induced polyneuropathy: Secondary | ICD-10-CM

## 2015-12-24 DIAGNOSIS — M549 Dorsalgia, unspecified: Secondary | ICD-10-CM

## 2015-12-24 DIAGNOSIS — T451X5A Adverse effect of antineoplastic and immunosuppressive drugs, initial encounter: Secondary | ICD-10-CM

## 2015-12-24 DIAGNOSIS — G8929 Other chronic pain: Secondary | ICD-10-CM

## 2015-12-24 DIAGNOSIS — D61810 Antineoplastic chemotherapy induced pancytopenia: Secondary | ICD-10-CM | POA: Diagnosis not present

## 2015-12-24 DIAGNOSIS — C9002 Multiple myeloma in relapse: Secondary | ICD-10-CM

## 2015-12-24 DIAGNOSIS — C9001 Multiple myeloma in remission: Secondary | ICD-10-CM

## 2015-12-24 MED ORDER — DEXAMETHASONE 4 MG PO TABS: ORAL_TABLET | ORAL | Status: DC

## 2015-12-24 MED ORDER — ACYCLOVIR 400 MG PO TABS: 400.0000 mg | ORAL_TABLET | Freq: Every day | ORAL | Status: DC

## 2015-12-24 NOTE — Assessment & Plan Note (Signed)
She had history of neuropathy. I will dose reduce Velcade

## 2015-12-24 NOTE — Assessment & Plan Note (Signed)
Her blood work showed early signs of relapse. She has mild pancytopenia and worsening back pain. There were no evidence of hypercalcemia or renal failure. I had a long discussion with the patient and family members. This is an incurable disease and treatment is strictly palliative. The patient has very poor tolerance to all treatment in the past The decision was made based on recent publication on NEJM and is a category 1 recommendation in NCCN guideline Role of treatment is palliative.  Daratumumab, Bortezomib, and Dexamethasone for Multiple Myeloma Lucienne Capers, M.D., Rulon Abide, M.D., Christophe Louis, M.D., Kern Reap. Luciana Axe, M.D., Miquel Dunn, M.D., Rolanda Jay, M.D., Mickel Crow, M.D., Desma Paganini, M.D., Charmayne Sheer, M.D., Nona Dell, M.D., Docia Chuck, M.D., Magda Kiel, M.D., Martinique Schecter, M.D., [place], B.S., Brayton Caves, M.S., Rande Lawman, Ph.D., Lynetta Mare, M.D., Judie Bonus, M.D., and Lilian Kapur, M.D., for the [employer]*  [employer] [facility] [phone]. DOI: 10.1056/[id number]  In this phase 3 trial, 498 patients with relapsed or relapsed and refractory multiple myeloma was randomized to receive bortezomib (1.3 mg per square meter of body surface area) and dexamethasone (20 mg) alone (control group) or in combination with daratumumab (16 mg per kilogram of body weight) (daratumumab group). The primary end point was progression-free survival.  RESULTS A prespecified interim analysis showed that the rate of progression-free survival was significantly higher in the daratumumab group than in the control group; the 69-monthrate of progression-free survival was 60.7% in the daratumumab group versus 26.9% in the control group. After a median follow-up period of 7.4 months, the median progression-free survival was not reached in the daratumumab group and was 7.2 months in the control group (hazard ratio for progression or death with  daratumumab vs. control, 0.39; 95% confidence interval, 0.28 to 0.53; P<0.001). The rate of overall response was higher in the daratumumab group than in the control group (82.9% vs. 63.2%, P<0.001), as were the rates of very good partial response or better (59.2% vs. 29.1%, P<0.001) and complete response or better (19.2% vs. 9.0%, P = 0.001). Three of the most common grade 3 or 4 adverse events reported in the daratumumab group and the control group were thrombocytopenia (45.3% and 32.9%, respectively), anemia (14.4% and 16.0%, respectively), and neutropenia (12.8% and 4.2%, respectively). Infusion-related reactions that were associated with daratumumab treatment were reported in 45.3% of the patients in the daratumumab group; these reactions were mostly grade 1 or 2 (grade 3 in 8.6% of the patients), and in 98.2% of these patients, they occurred during the first infusion.  Some of the short term side-effects included, though not limited to, risk of fatigue, weight loss, risk of allergic reactions, pancytopenia, life-threatening infections, need for transfusions of blood products, admission to hospital for various reasons, and risks of death.   The patient is aware that the response rates discussed earlier is not guaranteed.   With her age, pancytopenia and poor tolerance to treatment, I want to simplify her treatment to pulse dexamethasone at 20 mg weekly on Thursday, weekly Velcade injection along with weekly Daratumumab weekly for the first 8 weeks. She will continue to get Zometa every 3 months We discussed the use of acyclovir. I will order port placement After a long discussion, patient made an informed decision to proceed with the prescribed plan of care.

## 2015-12-24 NOTE — Telephone Encounter (Signed)
Gave pt cal & avs °

## 2015-12-24 NOTE — Assessment & Plan Note (Signed)
She is not symptomatic. I will dose reduce Velcade

## 2015-12-24 NOTE — Assessment & Plan Note (Signed)
She has acute on chronic pain with evidence of muscle spasm. This was precipitated by recent gardening activity. I recommend her to use her narcotic prescription which she is fairly reluctant to do so, due to side effects. I also recommend additional muscle relaxant and other conservative measures such as heating pad, massage and lidocaine topical cream as needed Right now, I reinforced the importance of taking the prescription as directed. She can take laxatives as needed if constipation arise.

## 2015-12-24 NOTE — Patient Instructions (Signed)
Daratumumab injection What is this medicine? DARATUMUMAB (dar a toom ue mab) is a monoclonal antibody. It is used to treat multiple myeloma. This medicine may be used for other purposes; ask your health care provider or pharmacist if you have questions. What should I tell my health care provider before I take this medicine? They need to know if you have any of these conditions: -infection (especially a virus infection such as chickenpox, cold sores, or herpes) -lung or breathing disease -pregnant or trying to get pregnant -breast-feeding -an unusual or allergic reaction to daratumumab, other medicines, foods, dyes, or preservatives How should I use this medicine? This medicine is for infusion into a vein. It is given by a health care professional in a hospital or clinic setting. Talk to your pediatrician regarding the use of this medicine in children. Special care may be needed. Overdosage: If you think you have taken too much of this medicine contact a poison control center or emergency room at once. NOTE: This medicine is only for you. Do not share this medicine with others. What if I miss a dose? Keep appointments for follow-up doses as directed. It is important not to miss your dose. Call your doctor or health care professional if you are unable to keep an appointment. What may interact with this medicine? Interactions have not been studied. Give your health care provider a list of all the medicines, herbs, non-prescription drugs, or dietary supplements you use. Also tell them if you smoke, drink alcohol, or use illegal drugs. Some items may interact with your medicine. This list may not describe all possible interactions. Give your health care provider a list of all the medicines, herbs, non-prescription drugs, or dietary supplements you use. Also tell them if you smoke, drink alcohol, or use illegal drugs. Some items may interact with your medicine. What should I watch for while using  this medicine? This drug may make you feel generally unwell. Report any side effects. Continue your course of treatment even though you feel ill unless your doctor tells you to stop. This medicine can cause serious allergic reactions. To reduce your risk you may need to take medicine before treatment with this medicine. Take your medicine as directed. This medicine can affect the results of blood tests to match your blood type. These changes can last for up to 6 months after the final dose. Your healthcare provider will do blood tests to match your blood type before you start treatment. Tell all of your healthcare providers that you are being treated with this medicine before receiving a blood transfusion. This medicine can affect the results of some tests used to determine treatment response; extra tests may be needed to evaluate response. Do not become pregnant while taking this medicine or for 3 months after stopping it. Women should inform their doctor if they wish to become pregnant or think they might be pregnant. There is a potential for serious side effects to an unborn child. Talk to your health care professional or pharmacist for more information. What side effects may I notice from receiving this medicine? Side effects that you should report to your doctor or health care professional as soon as possible: -allergic reactions like skin rash, itching or hives, swelling of the face, lips, or tongue -breathing problems -chills -cough -dizziness -feeling faint or lightheaded -headache -nausea, vomiting -shortness of breath Side effects that usually do not require medical attention (Report these to your doctor or health care professional if they continue or are bothersome.): -  feeling faint or lightheaded  -headache  -nausea, vomiting  -shortness of breath  Side effects that usually do not require medical attention (Report these to your doctor or health care professional if they continue or are bothersome.):  -back pain  -fever  -joint pain  -loss of appetite  -tiredness  This list may not describe all possible side effects. Call your doctor for medical advice about side effects. You may report side effects to FDA at  1-800-FDA-1088.  Where should I keep my medicine?  Keep out of the reach of children.  This drug is given in a hospital or clinic and will not be stored at home.  NOTE: This sheet is a summary. It may not cover all possible information. If you have questions about this medicine, talk to your doctor, pharmacist, or health care provider.     © 2016, [employer]. (2014-08-21 17:02:23)  Bortezomib injection  What is this medicine?  BORTEZOMIB (bor TEZ oh mib) is a medicine that targets proteins in cancer cells and stops the cancer cells from growing. It is used to treat multiple myeloma and mantle-cell lymphoma.  This medicine may be used for other purposes; ask your health care provider or pharmacist if you have questions.  What should I tell my health care provider before I take this medicine?  They need to know if you have any of these conditions:  -diabetes  -heart disease  -irregular heartbeat  -liver disease  -on hemodialysis  -low blood counts, like low white blood cells, platelets, or hemoglobin  -peripheral neuropathy  -taking medicine for blood pressure  -an unusual or allergic reaction to bortezomib, mannitol, boron, other medicines, foods, dyes, or preservatives  -pregnant or trying to get pregnant  -breast-feeding  How should I use this medicine?  This medicine is for injection into a vein or for injection under the skin. It is given by a health care professional in a hospital or clinic setting.  Talk to your pediatrician regarding the use of this medicine in children. Special care may be needed.  Overdosage: If you think you have taken too much of this medicine contact a poison control center or emergency room at once.  NOTE: This medicine is only for you. Do not share this medicine with others.  What if I miss a dose?  It is important not to miss your dose. Call your doctor or health care professional if you are unable to keep an appointment.  What may interact with this medicine?  This  medicine may interact with the following medications:  -ketoconazole  -rifampin  -ritonavir  -St. John's Wort  This list may not describe all possible interactions. Give your health care provider a list of all the medicines, herbs, non-prescription drugs, or dietary supplements you use. Also tell them if you smoke, drink alcohol, or use illegal drugs. Some items may interact with your medicine.  What should I watch for while using this medicine?  Visit your doctor for checks on your progress. This drug may make you feel generally unwell. This is not uncommon, as chemotherapy can affect healthy cells as well as cancer cells. Report any side effects. Continue your course of treatment even though you feel ill unless your doctor tells you to stop.  You may get drowsy or dizzy. Do not drive, use machinery, or do anything that needs mental alertness until you know how this medicine affects you. Do not stand or sit up quickly, especially if you are an older patient.   bruise or bleed. Call your doctor or health care professional if you notice any unusual bleeding. You may need blood work done while you are taking this medicine. In some patients, this medicine may cause a serious brain infection that may cause death. If you have any problems seeing, thinking, speaking, walking, or standing, tell your doctor right away. If you cannot reach your doctor, urgently seek other source of medical care. Do not become pregnant while taking this medicine. Women  should inform their doctor if they wish to become pregnant or think they might be pregnant. There is a potential for serious side effects to an unborn child. Talk to your health care professional or pharmacist for more information. Do not breast-feed an infant while taking this medicine. Check with your doctor or health care professional if you get an attack of severe diarrhea, nausea and vomiting, or if you sweat a lot. The loss of too much body fluid can make it dangerous for you to take this medicine. What side effects may I notice from receiving this medicine? Side effects that you should report to your doctor or health care professional as soon as possible: -allergic reactions like skin rash, itching or hives, swelling of the face, lips, or tongue -breathing problems -changes in hearing -changes in vision -fast, irregular heartbeat -feeling faint or lightheaded, falls -pain, tingling, numbness in the hands or feet -right upper belly pain -seizures -swelling of the ankles, feet, hands -unusual bleeding or bruising -unusually weak or tired -vomiting -yellowing of the eyes or skin Side effects that usually do not require medical attention (report to your doctor or health care professional if they continue or are bothersome): -changes in emotions or moods -constipation -diarrhea -loss of appetite -headache -irritation at site where injected -nausea This list may not describe all possible side effects. Call your doctor for medical advice about side effects. You may report side effects to FDA at 1-800-FDA-1088. Where should I keep my medicine? This drug is given in a hospital or clinic and will not be stored at home. NOTE: This sheet is a summary. It may not cover all possible information. If you have questions about this medicine, talk to your doctor, pharmacist, or health care provider.    2016, [employer]. (2014-08-21 14:47:04) Bortezomib injection What is this  medicine? BORTEZOMIB (bor TEZ oh mib) is a medicine that targets proteins in cancer cells and stops the cancer cells from growing. It is used to treat multiple myeloma and mantle-cell lymphoma. This medicine may be used for other purposes; ask your health care provider or pharmacist if you have questions. What should I tell my health care provider before I take this medicine? They need to know if you have any of these conditions: -diabetes -heart disease -irregular heartbeat -liver disease -on hemodialysis -low blood counts, like low white blood cells, platelets, or hemoglobin -peripheral neuropathy -taking medicine for blood pressure -an unusual or allergic reaction to bortezomib, mannitol, boron, other medicines, foods, dyes, or preservatives -pregnant or trying to get pregnant -breast-feeding How should I use this medicine? This medicine is for injection into a vein or for injection under the skin. It is given by a health care professional in a hospital or clinic setting. Talk to your pediatrician regarding the use of this medicine in children. Special care may be needed. Overdosage: If you think you have taken too much of this medicine contact a poison control center or emergency room at once. NOTE: This medicine is only for you.  Do not share this medicine with others. What if I miss a dose? It is important not to miss your dose. Call your doctor or health care professional if you are unable to keep an appointment. What may interact with this medicine? This medicine may interact with the following medications: -ketoconazole -rifampin -ritonavir -St. John's Wort This list may not describe all possible interactions. Give your health care provider a list of all the medicines, herbs, non-prescription drugs, or dietary supplements you use. Also tell them if you smoke, drink alcohol, or use illegal drugs. Some items may interact with your medicine. What should I watch for while using this  medicine? Visit your doctor for checks on your progress. This drug may make you feel generally unwell. This is not uncommon, as chemotherapy can affect healthy cells as well as cancer cells. Report any side effects. Continue your course of treatment even though you feel ill unless your doctor tells you to stop. You may get drowsy or dizzy. Do not drive, use machinery, or do anything that needs mental alertness until you know how this medicine affects you. Do not stand or sit up quickly, especially if you are an older patient. This reduces the risk of dizzy or fainting spells. In some cases, you may be given additional medicines to help with side effects. Follow all directions for their use. Call your doctor or health care professional for advice if you get a fever, chills or sore throat, or other symptoms of a cold or flu. Do not treat yourself. This drug decreases your body's ability to fight infections. Try to avoid being around people who are sick. This medicine may increase your risk to bruise or bleed. Call your doctor or health care professional if you notice any unusual bleeding. You may need blood work done while you are taking this medicine. In some patients, this medicine may cause a serious brain infection that may cause death. If you have any problems seeing, thinking, speaking, walking, or standing, tell your doctor right away. If you cannot reach your doctor, urgently seek other source of medical care. Do not become pregnant while taking this medicine. Women should inform their doctor if they wish to become pregnant or think they might be pregnant. There is a potential for serious side effects to an unborn child. Talk to your health care professional or pharmacist for more information. Do not breast-feed an infant while taking this medicine. Check with your doctor or health care professional if you get an attack of severe diarrhea, nausea and vomiting, or if you sweat a lot. The loss of too  much body fluid can make it dangerous for you to take this medicine. What side effects may I notice from receiving this medicine? Side effects that you should report to your doctor or health care professional as soon as possible: -allergic reactions like skin rash, itching or hives, swelling of the face, lips, or tongue -breathing problems -changes in hearing -changes in vision -fast, irregular heartbeat -feeling faint or lightheaded, falls -pain, tingling, numbness in the hands or feet -right upper belly pain -seizures -swelling of the ankles, feet, hands -unusual bleeding or bruising -unusually weak or tired -vomiting -yellowing of the eyes or skin Side effects that usually do not require medical attention (report to your doctor or health care professional if they continue or are bothersome): -changes in emotions or moods -constipation -diarrhea -loss of appetite -headache -irritation at site where injected -nausea This list may not describe all possible side  effects. Call your doctor for medical advice about side effects. You may report side effects to FDA at 1-800-FDA-1088. Where should I keep my medicine? This drug is given in a hospital or clinic and will not be stored at home. NOTE: This sheet is a summary. It may not cover all possible information. If you have questions about this medicine, talk to your doctor, pharmacist, or health care provider.    2016, [employer]. (2014-08-21 14:47:04) Bortezomib injection What is this medicine? BORTEZOMIB (bor TEZ oh mib) is a medicine that targets proteins in cancer cells and stops the cancer cells from growing. It is used to treat multiple myeloma and mantle-cell lymphoma. This medicine may be used for other purposes; ask your health care provider or pharmacist if you have questions. What should I tell my health care provider before I take this medicine? They need to know if you have any of these  conditions: -diabetes -heart disease -irregular heartbeat -liver disease -on hemodialysis -low blood counts, like low white blood cells, platelets, or hemoglobin -peripheral neuropathy -taking medicine for blood pressure -an unusual or allergic reaction to bortezomib, mannitol, boron, other medicines, foods, dyes, or preservatives -pregnant or trying to get pregnant -breast-feeding How should I use this medicine? This medicine is for injection into a vein or for injection under the skin. It is given by a health care professional in a hospital or clinic setting. Talk to your pediatrician regarding the use of this medicine in children. Special care may be needed. Overdosage: If you think you have taken too much of this medicine contact a poison control center or emergency room at once. NOTE: This medicine is only for you. Do not share this medicine with others. What if I miss a dose? It is important not to miss your dose. Call your doctor or health care professional if you are unable to keep an appointment. What may interact with this medicine? This medicine may interact with the following medications: -ketoconazole -rifampin -ritonavir -St. John's Wort This list may not describe all possible interactions. Give your health care provider a list of all the medicines, herbs, non-prescription drugs, or dietary supplements you use. Also tell them if you smoke, drink alcohol, or use illegal drugs. Some items may interact with your medicine. What should I watch for while using this medicine? Visit your doctor for checks on your progress. This drug may make you feel generally unwell. This is not uncommon, as chemotherapy can affect healthy cells as well as cancer cells. Report any side effects. Continue your course of treatment even though you feel ill unless your doctor tells you to stop. You may get drowsy or dizzy. Do not drive, use machinery, or do anything that needs mental alertness until you  know how this medicine affects you. Do not stand or sit up quickly, especially if you are an older patient. This reduces the risk of dizzy or fainting spells. In some cases, you may be given additional medicines to help with side effects. Follow all directions for their use. Call your doctor or health care professional for advice if you get a fever, chills or sore throat, or other symptoms of a cold or flu. Do not treat yourself. This drug decreases your body's ability to fight infections. Try to avoid being around people who are sick. This medicine may increase your risk to bruise or bleed. Call your doctor or health care professional if you notice any unusual bleeding. You may need blood work done while you  are taking this medicine. In some patients, this medicine may cause a serious brain infection that may cause death. If you have any problems seeing, thinking, speaking, walking, or standing, tell your doctor right away. If you cannot reach your doctor, urgently seek other source of medical care. Do not become pregnant while taking this medicine. Women should inform their doctor if they wish to become pregnant or think they might be pregnant. There is a potential for serious side effects to an unborn child. Talk to your health care professional or pharmacist for more information. Do not breast-feed an infant while taking this medicine. Check with your doctor or health care professional if you get an attack of severe diarrhea, nausea and vomiting, or if you sweat a lot. The loss of too much body fluid can make it dangerous for you to take this medicine. What side effects may I notice from receiving this medicine? Side effects that you should report to your doctor or health care professional as soon as possible: -allergic reactions like skin rash, itching or hives, swelling of the face, lips, or tongue -breathing problems -changes in hearing -changes in vision -fast, irregular heartbeat -feeling  faint or lightheaded, falls -pain, tingling, numbness in the hands or feet -right upper belly pain -seizures -swelling of the ankles, feet, hands -unusual bleeding or bruising -unusually weak or tired -vomiting -yellowing of the eyes or skin Side effects that usually do not require medical attention (report to your doctor or health care professional if they continue or are bothersome): -changes in emotions or moods -constipation -diarrhea -loss of appetite -headache -irritation at site where injected -nausea This list may not describe all possible side effects. Call your doctor for medical advice about side effects. You may report side effects to FDA at 1-800-FDA-1088. Where should I keep my medicine? This drug is given in a hospital or clinic and will not be stored at home. NOTE: This sheet is a summary. It may not cover all possible information. If you have questions about this medicine, talk to your doctor, pharmacist, or health care provider.    2016, [employer]. (2014-08-21 14:47:04)

## 2015-12-24 NOTE — Progress Notes (Signed)
[facility] OFFICE PROGRESS NOTE  Patient Care Team: Thressa Sheller, MD as PCP - General (Internal Medicine) Thressa Sheller, MD (Internal Medicine) Renella Cunas, MD as Attending Physician (Cardiology) Heath Lark, MD as Consulting Physician (Hematology and Oncology) Luanne Bras, MD as Consulting Physician (Interventional Radiology)  SUMMARY OF ONCOLOGIC HISTORY: Oncology History   The     Multiple myeloma in relapse [facility])   11/12/2008 Imaging Skeletal survey showed lytic lesion in the right femur compatible with  myeloma. There were Questionable skull lesions   11/15/2008 Bone Marrow Biopsy BONE MARROW ASPIRATE AND BIOPSY: showed NORMOCELLULAR MARROW FOR AGE WITH PLASMA CELL DYSCRASIA  (PLASMA CELLS 25%). 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 Subsequently she was placed on Revlimid & Dexamethasone alone   05/12/2013 Bone Marrow Biopsy Bone marrow biopsy showed persistent plasma cells. Blood work confirmed VGPR status   06/11/2014 Tumor Marker Bloodwork show disease relapse   06/26/2014 Procedure She has placement of port   07/05/2014 - 07/26/2014 Chemotherapy She received Elotuzumab and Revlimid. Rx was discontinued with elotuzumab per patient preference   08/01/2014 - 08/05/2014 Hospital Admission She was admitted to the hospital for kyphoplasty and pain management after traumatic compression fracture   08/23/2014 - 04/18/2015 Chemotherapy She was placed on dexamethasone & Revlimid   04/11/2015 Tumor Marker Blood work showed VGPR. Treatment was discontinued and she is maintained on Zometa only   10/20/2015 Imaging MRI spine showed acute/subacute superior endplate compression fractures at T2 and T9 are incompletely healed.    11/05/2015 Procedure Status post vertebral body augmentation using balloon kyphoplasty at T9 as  described without event    INTERVAL HISTORY: Please see below for problem oriented charting. She returns for further follow-up with her daughter. She continues to have severe back pain and has been adamant not taking Percocet. She only took 4 Percocet since I saw her because of fear for constipation. The tramadol only helps a little She rated her back pain at 8 out of 10 pain. Her daughter noticed that the patient is still trying to do a lot of things for several including heavy lifting She denies recent infection  REVIEW OF SYSTEMS:   Constitutional: Denies fevers, chills or abnormal weight loss Eyes: Denies blurriness of vision Ears, nose, mouth, throat, and face: Denies mucositis or sore throat Respiratory: Denies cough, dyspnea or wheezes Cardiovascular: Denies palpitation, chest discomfort or lower extremity swelling Gastrointestinal:  Denies nausea, heartburn or change in bowel habits Skin: Denies abnormal skin rashes Lymphatics: Denies new lymphadenopathy or easy bruising Neurological:Denies numbness, tingling or new weaknesses Behavioral/Psych: Mood is stable, no new changes  All other systems were reviewed with the patient and are negative.  I have reviewed the past medical history, past surgical history, social history and family history with the patient and they are unchanged from previous note.  ALLERGIES:  has No Known Allergies.  MEDICATIONS:  Current Outpatient Prescriptions  Medication Sig Dispense Refill  . acyclovir (ZOVIRAX) 400 MG tablet Take 1 tablet (400 mg total) by mouth daily. 60 tablet 11  . aspirin 325 MG EC tablet Take 325 mg by mouth daily.    . cholecalciferol (VITAMIN D) 1000 UNITS tablet Take 1,000 Units by mouth daily.    . cyclobenzaprine (FLEXERIL) 5 MG tablet Take 1 tablet (5 mg total) by mouth 3 (three) times daily as needed for muscle spasms. 30 tablet 0  .  dexamethasone (DECADRON) 4 MG tablet 20 mg weekly on Thursday 20 tablet 4  .  diphenhydramine-acetaminophen (TYLENOL PM) 25-500 MG TABS Take 1 tablet by mouth at bedtime as needed (Sleep). Reported on 11/18/2015    . gabapentin (NEURONTIN) 300 MG capsule TAKE ONE CAPSULE BY MOUTH THREE TIMES DAILY 90 capsule 3  . levothyroxine (SYNTHROID, LEVOTHROID) 50 MCG tablet Take 50 mcg by mouth daily before breakfast.    . loperamide (IMODIUM A-D) 2 MG tablet Take 2 mg by mouth as needed for diarrhea or loose stools (as directed.). Reported on 11/18/2015    . mirtazapine (REMERON) 7.5 MG tablet TAKE ONE TABLET BY MOUTH NIGHTLY AT BEDTIME 30 tablet 3  . Multiple Vitamin (MULTIVITAMIN WITH MINERALS) TABS tablet Take 1 tablet by mouth daily.    [name] oxyCODONE-acetaminophen (PERCOCET/ROXICET) 5-325 MG tablet Take 2 tablets by mouth every 6 (six) hours as needed for severe pain. (Patient taking differently: Take 1 tablet by mouth daily as needed for severe pain. ) 60 tablet 0  . Polyvinyl Alcohol-Povidone (REFRESH OP) Place 2 drops into both eyes 2 (two) times daily as needed (dry eyes).    . traMADol (ULTRAM) 50 MG tablet Take 2 tablets (100 mg total) by mouth every 6 (six) hours as needed for moderate pain (one to two tablets every 6 hrs as needed.). 90 tablet 0  . valsartan (DIOVAN) 160 MG tablet Take 1 tablet (160 mg total) by mouth daily. 30 tablet 3   No current facility-administered medications for this visit.    PHYSICAL EXAMINATION: ECOG PERFORMANCE STATUS: 1 - Symptomatic but completely ambulatory  Filed Vitals:   12/24/15 1111  BP: 136/56  Pulse: 79  Temp: 98.1 F (36.7 C)  Resp: 18   Filed Weights   12/24/15 1111  Weight: 115 lb 4.8 oz (52.3 kg)    GENERAL:alert, no distress and comfortable-. She looks thin SKIN: skin color, texture, turgor are normal, no rashes or significant lesions EYES: normal, Conjunctiva are pink and non-injected, sclera clear Musculoskeletal:no cyanosis of digits and no clubbing  NEURO: alert & oriented x 3 with fluent speech, no focal  motor/sensory deficits  LABORATORY DATA:  I have reviewed the data as listed    Component Value Date/Time   NA 134* 12/16/2015 1115   NA 136 11/05/2015 0700   K 4.5 12/16/2015 1115   K 3.9 11/05/2015 0700   CL 100* 11/05/2015 0700   CL 103 11/25/2012 1328   CO2 29 12/16/2015 1115   CO2 27 11/05/2015 0700   GLUCOSE 106 12/16/2015 1115   GLUCOSE 78 11/05/2015 0700   GLUCOSE 91 11/25/2012 1328   BUN 21.9 12/16/2015 1115   BUN 19 11/05/2015 0700   CREATININE 0.8 12/16/2015 1115   CREATININE 0.88 11/05/2015 0700   CALCIUM 9.5 12/16/2015 1115   CALCIUM 9.6 11/05/2015 0700   PROT 8.6* 12/16/2015 1115   PROT 7.9 12/16/2015 1115   PROT 5.7* 09/21/2014 0725   ALBUMIN 3.5 12/16/2015 1115   ALBUMIN 3.0* 09/21/2014 0725   AST 26 12/16/2015 1115   AST 22 09/21/2014 0725   ALT 17 12/16/2015 1115   ALT 17 09/21/2014 0725   ALKPHOS 66 12/16/2015 1115   ALKPHOS 43 09/21/2014 0725   BILITOT 0.40 12/16/2015 1115   BILITOT 0.7 09/21/2014 0725   GFRNONAA 57* 11/05/2015 0700   GFRAA >60 11/05/2015 0700    No results found for: SPEP, UPEP  Lab Results  Component Value Date   WBC 2.7* 12/16/2015   NEUTROABS 1.7  12/16/2015   HGB 10.5* 12/16/2015   HCT 31.6* 12/16/2015   MCV 103.6* 12/16/2015   PLT 172 12/16/2015      Chemistry      Component Value Date/Time   NA 134* 12/16/2015 1115   NA 136 11/05/2015 0700   K 4.5 12/16/2015 1115   K 3.9 11/05/2015 0700   CL 100* 11/05/2015 0700   CL 103 11/25/2012 1328   CO2 29 12/16/2015 1115   CO2 27 11/05/2015 0700   BUN 21.9 12/16/2015 1115   BUN 19 11/05/2015 0700   CREATININE 0.8 12/16/2015 1115   CREATININE 0.88 11/05/2015 0700      Component Value Date/Time   CALCIUM 9.5 12/16/2015 1115   CALCIUM 9.6 11/05/2015 0700   ALKPHOS 66 12/16/2015 1115   ALKPHOS 43 09/21/2014 0725   AST 26 12/16/2015 1115   AST 22 09/21/2014 0725   ALT 17 12/16/2015 1115   ALT 17 09/21/2014 0725   BILITOT 0.40 12/16/2015 1115   BILITOT 0.7  09/21/2014 0725       RADIOGRAPHIC STUDIES:I reviewed the skeletal survey I have personally reviewed the radiological images as listed and agreed with the findings in the report.    ASSESSMENT & PLAN:  Multiple myeloma in relapse ([place]) Her blood work showed early signs of relapse. She has mild pancytopenia and worsening back pain. There were no evidence of hypercalcemia or renal failure. I had a long discussion with the patient and family members. This is an incurable disease and treatment is strictly palliative. The patient has very poor tolerance to all treatment in the past The decision was made based on recent publication on NEJM and is a category 1 recommendation in NCCN guideline Role of treatment is palliative.  Daratumumab, Bortezomib, and Dexamethasone for Multiple Myeloma Lucienne Capers, M.D., Rulon Abide, M.D., Christophe Louis, M.D., Kern Reap. Luciana Axe, M.D., Miquel Dunn, M.D., Rolanda Jay, M.D., Mickel Crow, M.D., Desma Paganini, M.D., Charmayne Sheer, M.D., Nona Dell, M.D., Docia Chuck, M.D., Magda Kiel, M.D., Martinique Schecter, M.D., [place], B.S., Brayton Caves, M.S., Rande Lawman, Ph.D., Lynetta Mare, M.D., Judie Bonus, M.D., and Lilian Kapur, M.D., for the [employer]*  [employer] [facility] [phone]. DOI: 10.1056/[id number]  In this phase 3 trial, 498 patients with relapsed or relapsed and refractory multiple myeloma was randomized to receive bortezomib (1.3 mg per square meter of body surface area) and dexamethasone (20 mg) alone (control group) or in combination with daratumumab (16 mg per kilogram of body weight) (daratumumab group). The primary end point was progression-free survival.  RESULTS A prespecified interim analysis showed that the rate of progression-free survival was significantly higher in the daratumumab group than in the control group; the 64-monthrate of progression-free survival was 60.7% in the daratumumab group  versus 26.9% in the control group. After a median follow-up period of 7.4 months, the median progression-free survival was not reached in the daratumumab group and was 7.2 months in the control group (hazard ratio for progression or death with daratumumab vs. control, 0.39; 95% confidence interval, 0.28 to 0.53; P<0.001). The rate of overall response was higher in the daratumumab group than in the control group (82.9% vs. 63.2%, P<0.001), as were the rates of very good partial response or better (59.2% vs. 29.1%, P<0.001) and complete response or better (19.2% vs. 9.0%, P = 0.001). Three of the most common grade 3 or 4 adverse events reported in the daratumumab group and the control group were thrombocytopenia (45.3% and 32.9%, respectively), anemia (  14.4% and 16.0%, respectively), and neutropenia (12.8% and 4.2%, respectively). Infusion-related reactions that were associated with daratumumab treatment were reported in 45.3% of the patients in the daratumumab group; these reactions were mostly grade 1 or 2 (grade 3 in 8.6% of the patients), and in 98.2% of these patients, they occurred during the first infusion.  Some of the short term side-effects included, though not limited to, risk of fatigue, weight loss, risk of allergic reactions, pancytopenia, life-threatening infections, need for transfusions of blood products, admission to hospital for various reasons, and risks of death.   The patient is aware that the response rates discussed earlier is not guaranteed.   With her age, pancytopenia and poor tolerance to treatment, I want to simplify her treatment to pulse dexamethasone at 20 mg weekly on Thursday, weekly Velcade injection along with weekly Daratumumab weekly for the first 8 weeks. She will continue to get Zometa every 3 months We discussed the use of acyclovir. I will order port placement After a long discussion, patient made an informed decision to proceed with the prescribed plan of care.    Pancytopenia due to antineoplastic chemotherapy [facility]) She is not symptomatic. I will dose reduce Velcade  Neuropathy due to chemotherapeutic drug She had history of neuropathy. I will dose reduce Velcade  Chronic back pain greater than 3 months duration She has acute on chronic pain with evidence of muscle spasm. This was precipitated by recent gardening activity. I recommend her to use her narcotic prescription which she is fairly reluctant to do so, due to side effects. I also recommend additional muscle relaxant and other conservative measures such as heating pad, massage and lidocaine topical cream as needed Right now, I reinforced the importance of taking the prescription as directed. She can take laxatives as needed if constipation arise.     Orders Placed This Encounter  Procedures  . IR Fluoro Guide CV Line Right    Indicate type of CVC ordering    Standing Status: Future     Number of Occurrences:      Standing Expiration Date: 02/22/2017    Order Specific Question:  Reason for exam:    Answer:  need port for chemo    Order Specific Question:  Preferred Imaging Location?    Answer:  [facility]  . Uric acid    Standing Status: Standing     Number of Occurrences: 2     Standing Expiration Date: 12/23/2016  . Type and screen    Standing Status: Future     Number of Occurrences:      Standing Expiration Date: 12/23/2016   All questions were answered. The patient knows to call the clinic with any problems, questions or concerns. No barriers to learning was detected. I spent 40 minutes counseling the patient face to face. The total time spent in the appointment was 55 minutes and more than 50% was on counseling and review of test results     [facility], [place], MD 12/24/2015 1:48 PM

## 2015-12-24 NOTE — Telephone Encounter (Signed)
Per staff message and POF I have scheduled appts. Advised scheduler of appts. JMW  

## 2015-12-25 ENCOUNTER — Other Ambulatory Visit: Payer: Self-pay | Admitting: Hematology and Oncology

## 2015-12-25 ENCOUNTER — Telehealth: Payer: Self-pay | Admitting: *Deleted

## 2015-12-25 DIAGNOSIS — C9002 Multiple myeloma in relapse: Secondary | ICD-10-CM

## 2015-12-25 DIAGNOSIS — M4850XG Collapsed vertebra, not elsewhere classified, site unspecified, subsequent encounter for fracture with delayed healing: Secondary | ICD-10-CM

## 2015-12-25 DIAGNOSIS — IMO0001 Reserved for inherently not codable concepts without codable children: Secondary | ICD-10-CM

## 2015-12-25 MED ORDER — OXYCODONE-ACETAMINOPHEN 5-325 MG PO TABS: 2.0000 | ORAL_TABLET | Freq: Four times a day (QID) | ORAL | Status: DC | PRN

## 2015-12-25 NOTE — Telephone Encounter (Signed)
PT left VM states she will need new Rx for percocet.  She will pick up on Friday when she comes to hospital for [facility] placement.  Rx ready to pick up.

## 2015-12-26 ENCOUNTER — Other Ambulatory Visit: Payer: Self-pay | Admitting: Radiology

## 2015-12-26 ENCOUNTER — Ambulatory Visit: Payer: Medicare Other

## 2015-12-26 ENCOUNTER — Ambulatory Visit: Payer: Medicare Other | Admitting: Hematology and Oncology

## 2015-12-26 ENCOUNTER — Other Ambulatory Visit: Payer: Medicare Other

## 2015-12-27 ENCOUNTER — Other Ambulatory Visit: Payer: Self-pay | Admitting: Hematology and Oncology

## 2015-12-27 ENCOUNTER — Ambulatory Visit (HOSPITAL_COMMUNITY)
Admission: RE | Admit: 2015-12-27 | Discharge: 2015-12-27 | Disposition: A | Discharge status: Home / Self Care | Payer: Medicare Other | Source: Ambulatory Visit | Attending: Hematology and Oncology | Admitting: Hematology and Oncology

## 2015-12-27 ENCOUNTER — Encounter (HOSPITAL_COMMUNITY): Payer: Self-pay

## 2015-12-27 ENCOUNTER — Telehealth: Payer: Self-pay | Admitting: *Deleted

## 2015-12-27 DIAGNOSIS — C9000 Multiple myeloma not having achieved remission: Secondary | ICD-10-CM

## 2015-12-27 DIAGNOSIS — M4850XS Collapsed vertebra, not elsewhere classified, site unspecified, sequela of fracture: Secondary | ICD-10-CM

## 2015-12-27 DIAGNOSIS — C9002 Multiple myeloma in relapse: Secondary | ICD-10-CM | POA: Diagnosis not present

## 2015-12-27 DIAGNOSIS — M549 Dorsalgia, unspecified: Secondary | ICD-10-CM | POA: Insufficient documentation

## 2015-12-27 DIAGNOSIS — Z681 Body mass index (BMI) 19 or less, adult: Secondary | ICD-10-CM | POA: Diagnosis not present

## 2015-12-27 DIAGNOSIS — R634 Abnormal weight loss: Secondary | ICD-10-CM | POA: Insufficient documentation

## 2015-12-27 DIAGNOSIS — Z7982 Long term (current) use of aspirin: Secondary | ICD-10-CM | POA: Diagnosis not present

## 2015-12-27 DIAGNOSIS — IMO0001 Reserved for inherently not codable concepts without codable children: Secondary | ICD-10-CM

## 2015-12-27 DIAGNOSIS — M129 Arthropathy, unspecified: Secondary | ICD-10-CM

## 2015-12-27 DIAGNOSIS — Z79899 Other long term (current) drug therapy: Secondary | ICD-10-CM | POA: Diagnosis not present

## 2015-12-27 LAB — CBC WITH DIFFERENTIAL/PLATELET
BASOS ABS: 0 10*3/uL (ref 0.0–0.1)
BASOS PCT: 0 %
EOS ABS: 0 10*3/uL (ref 0.0–0.7)
EOS PCT: 0 %
HCT: 26.2 % — ABNORMAL LOW (ref 36.0–46.0)
HEMOGLOBIN: 9.1 g/dL — AB (ref 12.0–15.0)
Lymphocytes Relative: 28 %
Lymphs Abs: 1.3 10*3/uL (ref 0.7–4.0)
MCH: 34 pg (ref 26.0–34.0)
MCHC: 34.7 g/dL (ref 30.0–36.0)
MCV: 97.8 fL (ref 78.0–100.0)
Monocytes Absolute: 0.2 10*3/uL (ref 0.1–1.0)
Monocytes Relative: 4 %
NEUTROS PCT: 68 %
Neutro Abs: 3.1 10*3/uL (ref 1.7–7.7)
PLATELETS: 184 10*3/uL (ref 150–400)
RBC: 2.68 MIL/uL — AB (ref 3.87–5.11)
RDW: 15.2 % (ref 11.5–15.5)
WBC: 4.6 10*3/uL (ref 4.0–10.5)

## 2015-12-27 LAB — PROTIME-INR
INR: 0.96 (ref 0.00–1.49)
PROTHROMBIN TIME: 13 s (ref 11.6–15.2)

## 2015-12-27 MED ORDER — MIDAZOLAM HCL 2 MG/2ML IJ SOLN
INTRAMUSCULAR | Status: AC | PRN
  Administered 2015-12-27: 1 mg via INTRAVENOUS
  Administered 2015-12-27: 0.5 mg via INTRAVENOUS

## 2015-12-27 MED ORDER — FENTANYL CITRATE (PF) 100 MCG/2ML IJ SOLN
INTRAMUSCULAR | Status: AC | PRN
  Administered 2015-12-27: 50 ug via INTRAVENOUS

## 2015-12-27 MED ORDER — HEPARIN SOD (PORK) LOCK FLUSH 100 UNIT/ML IV SOLN
INTRAVENOUS | Status: AC | PRN
  Administered 2015-12-27: 500 [IU] via INTRAVENOUS

## 2015-12-27 MED ORDER — TRAMADOL HCL 50 MG PO TABS: 100.0000 mg | ORAL_TABLET | Freq: Four times a day (QID) | ORAL | Status: DC | PRN

## 2015-12-27 MED ORDER — FENTANYL CITRATE (PF) 100 MCG/2ML IJ SOLN
INTRAMUSCULAR | Status: AC
  Filled 2015-12-27: qty 4

## 2015-12-27 MED ORDER — HEPARIN SOD (PORK) LOCK FLUSH 100 UNIT/ML IV SOLN
INTRAVENOUS | Status: AC
  Filled 2015-12-27: qty 5

## 2015-12-27 MED ORDER — SODIUM CHLORIDE 0.9 % IV SOLN
INTRAVENOUS | Status: DC
  Administered 2015-12-27: 12:00:00 via INTRAVENOUS

## 2015-12-27 MED ORDER — MIDAZOLAM HCL 2 MG/2ML IJ SOLN
INTRAMUSCULAR | Status: AC
  Filled 2015-12-27: qty 6

## 2015-12-27 MED ORDER — CEFAZOLIN SODIUM-DEXTROSE 2-4 GM/100ML-% IV SOLN
2.0000 g | INTRAVENOUS | Status: AC
  Administered 2015-12-27: 2 g via INTRAVENOUS
  Filled 2015-12-27 (×2): qty 100

## 2015-12-27 MED ORDER — LIDOCAINE HCL 1 % IJ SOLN
INTRAMUSCULAR | Status: AC
  Filled 2015-12-27: qty 20

## 2015-12-27 NOTE — Discharge Instructions (Signed)
Moderate Conscious Sedation, Adult, Care After °Refer to this sheet in the next few weeks. These instructions provide you with information on caring for yourself after your procedure. Your health care provider may also give you more specific instructions. Your treatment has been planned according to current medical practices, but problems sometimes occur. Call your health care provider if you have any problems or questions after your procedure. °WHAT TO EXPECT AFTER THE PROCEDURE  °After your procedure: °· You may feel sleepy, clumsy, and have poor balance for several hours. °· Vomiting may occur if you eat too soon after the procedure. °HOME CARE INSTRUCTIONS °· Do not participate in any activities where you could become injured for at least 24 hours. Do not: °· Drive. °· Swim. °· Ride a bicycle. °· Operate heavy machinery. °· Cook. °· Use power tools. °· Climb ladders. °· Work from a high place. °· Do not make important decisions or sign legal documents until you are improved. °· If you vomit, drink water, juice, or soup when you can drink without vomiting. Make sure you have little or no nausea before eating solid foods. °· Only take over-the-counter or prescription medicines for pain, discomfort, or fever as directed by your health care provider. °· Make sure you and your family fully understand everything about the medicines given to you, including what side effects may occur. °· You should not drink alcohol, take sleeping pills, or take medicines that cause drowsiness for at least 24 hours. °· If you smoke, do not smoke without supervision. °· If you are feeling better, you may resume normal activities 24 hours after you were sedated. °· Keep all appointments with your health care provider. °SEEK MEDICAL CARE IF: °· Your skin is pale or bluish in color. °· You continue to feel nauseous or vomit. °· Your pain is getting worse and is not helped by medicine. °· You have bleeding or swelling. °· You are still  sleepy or feeling clumsy after 24 hours. °SEEK IMMEDIATE MEDICAL CARE IF: °· You develop a rash. °· You have difficulty breathing. °· You develop any type of allergic problem. °· You have a fever. °MAKE SURE YOU: °· Understand these instructions. °· Will watch your condition. °· Will get help right away if you are not doing well or get worse. °  °This information is not intended to replace advice given to you by your health care provider. Make sure you discuss any questions you have with your health care provider. °  °Document Released: 04/12/2013 Document Revised: 07/13/2014 Document Reviewed: 04/12/2013 °Elsevier Interactive Patient Education ©2016 Elsevier Inc. ° ° °Implanted Port Home Guide °An implanted port is a type of central line that is placed under the skin. Central lines are used to provide IV access when treatment or nutrition needs to be given through a person's veins. Implanted ports are used for long-term IV access. An implanted port may be placed because:  °· You need IV medicine that would be irritating to the small veins in your hands or arms.   °· You need long-term IV medicines, such as antibiotics.   °· You need IV nutrition for a long period.   °· You need frequent blood draws for lab tests.   °· You need dialysis.   °Implanted ports are usually placed in the chest area, but they can also be placed in the upper arm, the abdomen, or the leg. An implanted port has two main parts:  °· Reservoir. The reservoir is round and will appear as a small, raised   area under your skin. The reservoir is the part where a needle is inserted to give medicines or draw blood.   °· Catheter. The catheter is a thin, flexible tube that extends from the reservoir. The catheter is placed into a large vein. Medicine that is inserted into the reservoir goes into the catheter and then into the vein.   °HOW WILL I CARE FOR MY INCISION SITE? °Do not get the incision site wet. Bathe or shower as directed by your health care  provider.  °HOW IS MY PORT ACCESSED? °Special steps must be taken to access the port:  °· Before the port is accessed, a numbing cream can be placed on the skin. This helps numb the skin over the port site.   °· Your health care provider uses a sterile technique to access the port. °· Your health care provider must put on a mask and sterile gloves. °· The skin over your port is cleaned carefully with an antiseptic and allowed to dry. °· The port is gently pinched between sterile gloves, and a needle is inserted into the port. °· Only "non-coring" port needles should be used to access the port. Once the port is accessed, a blood return should be checked. This helps ensure that the port is in the vein and is not clogged.   °· If your port needs to remain accessed for a constant infusion, a clear (transparent) bandage will be placed over the needle site. The bandage and needle will need to be changed every week, or as directed by your health care provider.   °· Keep the bandage covering the needle clean and dry. Do not get it wet. Follow your health care provider's instructions on how to take a shower or bath while the port is accessed.   °· If your port does not need to stay accessed, no bandage is needed over the port.   °WHAT IS FLUSHING? °Flushing helps keep the port from getting clogged. Follow your health care provider's instructions on how and when to flush the port. Ports are usually flushed with saline solution or a medicine called heparin. The need for flushing will depend on how the port is used.  °· If the port is used for intermittent medicines or blood draws, the port will need to be flushed:   °· After medicines have been given.   °· After blood has been drawn.   °· As part of routine maintenance.   °· If a constant infusion is running, the port may not need to be flushed.   °HOW LONG WILL MY PORT STAY IMPLANTED? °The port can stay in for as long as your health care provider thinks it is needed. When it  is time for the port to come out, surgery will be done to remove it. The procedure is similar to the one performed when the port was put in.  °WHEN SHOULD I SEEK IMMEDIATE MEDICAL CARE? °When you have an implanted port, you should seek immediate medical care if:  °· You notice a bad smell coming from the incision site.   °· You have swelling, redness, or drainage at the incision site.   °· You have more swelling or pain at the port site or the surrounding area.   °· You have a fever that is not controlled with medicine. °  °This information is not intended to replace advice given to you by your health care provider. Make sure you discuss any questions you have with your health care provider. °  °Document Released: 06/22/2005 Document Revised: 04/12/2013 Document Reviewed: 02/27/2013 °Elsevier Interactive   Patient Education ©2016 Elsevier Inc. ° ° °Implanted Port Insertion, Care After °Refer to this sheet in the next few weeks. These instructions provide you with information on caring for yourself after your procedure. Your health care provider may also give you more specific instructions. Your treatment has been planned according to current medical practices, but problems sometimes occur. Call your health care provider if you have any problems or questions after your procedure. °WHAT TO EXPECT AFTER THE PROCEDURE °After your procedure, it is typical to have the following:  °· Discomfort at the port insertion site. Ice packs to the area will help. °· Bruising on the skin over the port. This will subside in 3-4 days. °HOME CARE INSTRUCTIONS °· After your port is placed, you will get a manufacturer's information card. The card has information about your port. Keep this card with you at all times.   °· Know what kind of port you have. There are many types of ports available.   °· Wear a medical alert bracelet in case of an emergency. This can help alert health care workers that you have a port.   °· The port can stay in  for as long as your health care provider believes it is necessary.   °· A home health care nurse may give medicines and take care of the port.   °· You or a family member can get special training and directions for giving medicine and taking care of the port at home.   °SEEK MEDICAL CARE IF:  °· Your port does not flush or you are unable to get a blood return.   °· You have a fever or chills. °SEEK IMMEDIATE MEDICAL CARE IF: °· You have new fluid or pus coming from your incision.   °· You notice a bad smell coming from your incision site.   °· You have swelling, pain, or more redness at the incision or port site.   °· You have chest pain or shortness of breath. °  °This information is not intended to replace advice given to you by your health care provider. Make sure you discuss any questions you have with your health care provider. °  °Document Released: 04/12/2013 Document Revised: 06/27/2013 Document Reviewed: 04/12/2013 °Elsevier Interactive Patient Education ©2016 Elsevier Inc. ° °

## 2015-12-27 NOTE — Telephone Encounter (Signed)
Pt requests refill on Tramadol in addition to picking up Rx for percocet.  Pt prefers to take tramadol for more mild pain as needed and will use percocet for more severe pain.  Ok w/ Dr. Alvy Bimler.  Informed pt Rx for percocet is ready to pick up from the other day and I will call in the refill on Tramadol.  She verbalized understanding.

## 2015-12-27 NOTE — Procedures (Signed)
Successful RT IJ POWER PORT Tip svc/ra No comp Stable Ready for use Full report in PACS 

## 2015-12-27 NOTE — Progress Notes (Signed)
Patient ID: Natasha Jackson, female   DOB: 16-Dec-1925, 82 y.o.   MRN: [id number]    Referring Physician(s): [place] Physician: Daryll Brod  Patient Status:  Outpatient  Chief Complaint:  "I'm here for another port a cath"  Subjective: Patient familiar to IR service from prior right chest wall Port-A-Cath in 2015, subsequent removal in November 2016,  as well as  prior T9 and T12 kyphoplasties most recently in May 2017. She has a history of relapsing multiple myeloma and presents today for Port-A-Cath placement for palliative chemotherapy. She currently denies fever, headache, chest pain, dyspnea, cough, abdominal pain, nausea, vomiting or abnormal bleeding. She has had weight loss and persistent back pain.   Allergies: Review of patient's allergies indicates no known allergies.  Medications: Prior to Admission medications   Medication Sig Start Date End Date Taking? Authorizing Provider  acyclovir (ZOVIRAX) 400 MG tablet Take 1 tablet (400 mg total) by mouth daily. 12/24/15  Yes Heath Lark, MD  aspirin 325 MG EC tablet Take 325 mg by mouth daily.   Yes Historical Provider, MD  cholecalciferol (VITAMIN D) 1000 UNITS tablet Take 1,000 Units by mouth daily. 05/01/13  Yes Heath Lark, MD  cyclobenzaprine (FLEXERIL) 5 MG tablet Take 1 tablet (5 mg total) by mouth 3 (three) times daily as needed for muscle spasms. 12/16/15  Yes Heath Lark, MD  dexamethasone (DECADRON) 4 MG tablet 20 mg weekly on Thursday 12/24/15  Yes Heath Lark, MD  gabapentin (NEURONTIN) 300 MG capsule TAKE ONE CAPSULE BY MOUTH THREE TIMES DAILY 12/03/15  Yes Heath Lark, MD  levothyroxine (SYNTHROID, LEVOTHROID) 50 MCG tablet Take 50 mcg by mouth daily before breakfast.   Yes Historical Provider, MD  mirtazapine (REMERON) 7.5 MG tablet TAKE ONE TABLET BY MOUTH NIGHTLY AT BEDTIME 06/25/15  Yes Heath Lark, MD  Multiple Vitamin (MULTIVITAMIN WITH MINERALS) TABS tablet Take 1 tablet by mouth daily.   Yes Historical  Provider, MD  oxyCODONE-acetaminophen (PERCOCET/ROXICET) 5-325 MG tablet Take 2 tablets by mouth every 6 (six) hours as needed for severe pain. 12/25/15  Yes Heath Lark, MD  Polyvinyl Alcohol-Povidone (REFRESH OP) Place 2 drops into both eyes 2 (two) times daily as needed (dry eyes).   Yes Historical Provider, MD  traMADol (ULTRAM) 50 MG tablet Take 2 tablets (100 mg total) by mouth every 6 (six) hours as needed for moderate pain (one to two tablets every 6 hrs as needed.). 12/27/15  Yes Heath Lark, MD  valsartan (DIOVAN) 160 MG tablet Take 1 tablet (160 mg total) by mouth daily. 10/23/15  Yes Eileen Stanford, PA-C  diphenhydramine-acetaminophen (TYLENOL PM) 25-500 MG TABS Take 1 tablet by mouth at bedtime as needed (Sleep). Reported on 11/18/2015    Historical Provider, MD  loperamide (IMODIUM A-D) 2 MG tablet Take 2 mg by mouth as needed for diarrhea or loose stools (as directed.). Reported on 11/18/2015    Historical Provider, MD     Vital Signs: BP 148/58 mmHg  Pulse 70  Temp(Src) 98.2 F (36.8 C) (Oral)  Resp 18  Ht 5' 4"  (1.626 m)  Wt 113 lb 12.8 oz (51.619 kg)  BMI 19.52 kg/m2  SpO2 100%  Physical Exam patient awake, alert. Chest clear to auscultation bilaterally. Heart with regular rate and rhythm. Abd soft, positive bowel sounds, nontender; lower extremities with no edema  Imaging: No results found.  Labs:  CBC:  Recent Labs  09/19/15 1547 11/05/15 0700 12/16/15 1115 12/27/15 1130  WBC 3.3* 3.4* 2.7* 4.6  HGB 11.2* 11.4* 10.5* 9.1*  HCT 33.4* 34.2* 31.6* 26.2*  PLT 180 211 172 184    COAGS:  Recent Labs  05/09/15 1210 11/05/15 0700 12/27/15 1130  INR 0.96 1.01 0.96  APTT  --  27  --     BMP:  Recent Labs  04/11/15 1340 09/19/15 1547 11/05/15 0700 12/16/15 1115  NA 138 137 136 134*  K 3.8 4.6 3.9 4.5  CL  --   --  100*  --   CO2 28 28 27 29   GLUCOSE 105 73 78 106  BUN 13.9 20.2 19 21.9  CALCIUM 8.9 9.3 9.6 9.5  CREATININE 0.7 1.0 0.88 0.8    GFRNONAA  --   --  57*  --   GFRAA  --   --  >60  --     LIVER FUNCTION TESTS:  Recent Labs  01/10/15 1359 04/11/15 1340 09/19/15 1540 09/19/15 1547 12/16/15 1115  BILITOT 0.44 0.55  --  0.38 0.40  AST 19 28  --  31 26  ALT 19 24  --  21 17  ALKPHOS 65 67  --  69 66  PROT 5.8* 6.1* 7.2 7.7 8.6*  7.9  ALBUMIN 3.1* 3.5  --  3.7 3.5    Assessment and Plan: Patient with history of relapsing multiple myeloma and prior right chest wall Port-A-Cath in 2015, subsequently removed in 2016 due to remission;  plan today is for new Port-A-Cath placement for palliative chemotherapy.Risks and benefits discussed with the patient /family including, but not limited to bleeding, infection, pneumothorax, or fibrin sheath development and need for additional procedures.All of the patient's questions were answered, patient is agreeable to proceed.Consent signed and in chart.     Electronically Signed: D. Rowe Robert 12/27/2015, 12:08 PM   I spent a total of 20 minutes at the the patient's bedside AND on the patient's hospital floor or unit, greater than 50% of which was counseling/coordinating care for port a cath placement

## 2015-12-30 ENCOUNTER — Telehealth: Payer: Self-pay | Admitting: *Deleted

## 2015-12-30 NOTE — Telephone Encounter (Signed)
Pt left VM asking if there is option to just go back on Revlimid alone instead of starting new Treatment with IV Daratumumab?   Discussed w/ Dr. Alvy Bimler and called pt back.  Discussed w/ pt Dr. Alvy Bimler recommends prescribed tx w/ Dara and Velcade is more effective and will work quicker than Revlimid alone.  Dr. Alvy Bimler would not recommend Revlimid by itself in any case she would combine w/ Dara.  Discussed w/ pt she had a lot of side effects on Revlimid including neuropathy, fatigue and diarrhea.  Pt admits she did not tolerate Revlimid very well.   Explained what to expect with new treatment and it is generally well tolerated.  Offered to schedule pt to see Dr. Alvy Bimler again prior to treatment and pt declined.  Offered to refer pt for second opinion and pt declined.  She says she will keep appts for lab/ treatment this Thursday as scheduled.  She will call us back if she changes her mind or any further questions.

## 2015-12-31 ENCOUNTER — Ambulatory Visit (HOSPITAL_COMMUNITY)
Admission: RE | Admit: 2015-12-31 | Discharge: 2015-12-31 | Disposition: A | Discharge status: Home / Self Care | Payer: Medicare Other | Source: Ambulatory Visit | Attending: Hematology and Oncology | Admitting: Hematology and Oncology

## 2015-12-31 DIAGNOSIS — C9002 Multiple myeloma in relapse: Secondary | ICD-10-CM | POA: Insufficient documentation

## 2016-01-01 ENCOUNTER — Other Ambulatory Visit: Payer: Self-pay | Admitting: *Deleted

## 2016-01-01 DIAGNOSIS — C9002 Multiple myeloma in relapse: Secondary | ICD-10-CM

## 2016-01-02 ENCOUNTER — Ambulatory Visit: Payer: Medicare Other

## 2016-01-02 ENCOUNTER — Encounter: Payer: Self-pay | Admitting: Pharmacist

## 2016-01-02 ENCOUNTER — Ambulatory Visit (HOSPITAL_BASED_OUTPATIENT_CLINIC_OR_DEPARTMENT_OTHER): Payer: Medicare Other

## 2016-01-02 ENCOUNTER — Other Ambulatory Visit (HOSPITAL_BASED_OUTPATIENT_CLINIC_OR_DEPARTMENT_OTHER): Payer: Medicare Other

## 2016-01-02 VITALS — BP 140/52 | HR 67 | Temp 98.0°F | Resp 16

## 2016-01-02 VITALS — BP 131/66 | HR 70 | Temp 98.5°F | Resp 18

## 2016-01-02 DIAGNOSIS — Z5112 Encounter for antineoplastic immunotherapy: Secondary | ICD-10-CM | POA: Diagnosis present

## 2016-01-02 DIAGNOSIS — C9002 Multiple myeloma in relapse: Secondary | ICD-10-CM

## 2016-01-02 DIAGNOSIS — C9001 Multiple myeloma in remission: Secondary | ICD-10-CM

## 2016-01-02 LAB — CBC WITH DIFFERENTIAL/PLATELET
BASO%: 0 % (ref 0.0–2.0)
BASOS ABS: 0 10*3/uL (ref 0.0–0.1)
EOS ABS: 0 10*3/uL (ref 0.0–0.5)
EOS%: 0.4 % (ref 0.0–7.0)
HCT: 27.1 % — ABNORMAL LOW (ref 34.8–46.6)
HGB: 9.3 g/dL — ABNORMAL LOW (ref 11.6–15.9)
LYMPH%: 37.5 % (ref 14.0–49.7)
MCH: 34.7 pg — ABNORMAL HIGH (ref 25.1–34.0)
MCHC: 34.3 g/dL (ref 31.5–36.0)
MCV: 101.1 fL — AB (ref 79.5–101.0)
MONO#: 0.1 10*3/uL (ref 0.1–0.9)
MONO%: 4.4 % (ref 0.0–14.0)
NEUT#: 1.5 10*3/uL (ref 1.5–6.5)
NEUT%: 57.7 % (ref 38.4–76.8)
PLATELETS: 167 10*3/uL (ref 145–400)
RBC: 2.68 10*6/uL — AB (ref 3.70–5.45)
RDW: 15.6 % — ABNORMAL HIGH (ref 11.2–14.5)
WBC: 2.5 10*3/uL — ABNORMAL LOW (ref 3.9–10.3)
lymph#: 0.9 10*3/uL (ref 0.9–3.3)

## 2016-01-02 LAB — COMPREHENSIVE METABOLIC PANEL
ALT: 17 U/L (ref 0–55)
ANION GAP: 7 meq/L (ref 3–11)
AST: 27 U/L (ref 5–34)
Albumin: 3.4 g/dL — ABNORMAL LOW (ref 3.5–5.0)
Alkaline Phosphatase: 65 U/L (ref 40–150)
BILIRUBIN TOTAL: 0.41 mg/dL (ref 0.20–1.20)
BUN: 16.5 mg/dL (ref 7.0–26.0)
CO2: 27 meq/L (ref 22–29)
Calcium: 9.5 mg/dL (ref 8.4–10.4)
Chloride: 97 mEq/L — ABNORMAL LOW (ref 98–109)
Creatinine: 0.8 mg/dL (ref 0.6–1.1)
EGFR: 68 mL/min/{1.73_m2} — AB (ref >90)
Glucose: 85 mg/dl (ref 70–140)
Potassium: 4.3 mEq/L (ref 3.5–5.1)
Sodium: 132 mEq/L — ABNORMAL LOW (ref 136–145)
TOTAL PROTEIN: 8.5 g/dL — AB (ref 6.4–8.3)

## 2016-01-02 LAB — TYPE AND SCREEN
ABO/RH(D): O POS
ANTIBODY SCREEN: NEGATIVE
DAT, IGG: NEGATIVE

## 2016-01-02 LAB — URIC ACID: Uric Acid, Serum: 5.1 mg/dl (ref 2.6–7.4)

## 2016-01-02 MED ORDER — ACETAMINOPHEN 325 MG PO TABS
650.0000 mg | ORAL_TABLET | Freq: Once | ORAL | Status: AC
  Administered 2016-01-02: 650 mg via ORAL

## 2016-01-02 MED ORDER — ONDANSETRON HCL 8 MG PO TABS: 8.0000 mg | ORAL_TABLET | Freq: Three times a day (TID) | ORAL | Status: DC | PRN

## 2016-01-02 MED ORDER — SODIUM CHLORIDE 0.9 % IV SOLN
Freq: Once | INTRAVENOUS | Status: AC
  Administered 2016-01-02: 10:00:00 via INTRAVENOUS

## 2016-01-02 MED ORDER — SODIUM CHLORIDE 0.9 % IJ SOLN
10.0000 mL | Freq: Once | INTRAMUSCULAR | Status: AC
  Administered 2016-01-02: 10 mL
  Filled 2016-01-02: qty 10

## 2016-01-02 MED ORDER — HEPARIN SOD (PORK) LOCK FLUSH 100 UNIT/ML IV SOLN
500.0000 [IU] | Freq: Once | INTRAVENOUS | Status: AC | PRN
  Administered 2016-01-02: 500 [IU]
  Filled 2016-01-02: qty 5

## 2016-01-02 MED ORDER — ACETAMINOPHEN 325 MG PO TABS
ORAL_TABLET | ORAL | Status: AC
  Filled 2016-01-02: qty 2

## 2016-01-02 MED ORDER — MONTELUKAST SODIUM 10 MG PO TABS
10.0000 mg | ORAL_TABLET | Freq: Once | ORAL | Status: AC
  Administered 2016-01-02: 10 mg via ORAL
  Filled 2016-01-02: qty 1

## 2016-01-02 MED ORDER — BORTEZOMIB CHEMO SQ INJECTION 3.5 MG (2.5MG/ML)
0.9750 mg/m2 | Freq: Once | INTRAMUSCULAR | Status: AC
  Administered 2016-01-02: 1.5 mg via SUBCUTANEOUS
  Filled 2016-01-02: qty 1.5

## 2016-01-02 MED ORDER — SODIUM CHLORIDE 0.9% FLUSH
10.0000 mL | INTRAVENOUS | Status: DC | PRN
  Administered 2016-01-02: 10 mL
  Filled 2016-01-02: qty 10

## 2016-01-02 MED ORDER — PROCHLORPERAZINE MALEATE 10 MG PO TABS: 10.0000 mg | ORAL_TABLET | Freq: Four times a day (QID) | ORAL | Status: DC | PRN

## 2016-01-02 MED ORDER — DIPHENHYDRAMINE HCL 25 MG PO CAPS
50.0000 mg | ORAL_CAPSULE | Freq: Once | ORAL | Status: AC
  Administered 2016-01-02: 50 mg via ORAL

## 2016-01-02 MED ORDER — METHYLPREDNISOLONE SODIUM SUCC 125 MG IJ SOLR
125.0000 mg | Freq: Once | INTRAMUSCULAR | Status: AC
  Administered 2016-01-02: 125 mg via INTRAVENOUS

## 2016-01-02 MED ORDER — METHYLPREDNISOLONE SODIUM SUCC 125 MG IJ SOLR
INTRAMUSCULAR | Status: AC
  Filled 2016-01-02: qty 2

## 2016-01-02 MED ORDER — DARATUMUMAB CHEMO INJECTION 400 MG/20ML
15.3000 mg/kg | Freq: Once | INTRAVENOUS | Status: AC
  Administered 2016-01-02: 800 mg via INTRAVENOUS
  Filled 2016-01-02: qty 40

## 2016-01-02 MED ORDER — PROCHLORPERAZINE MALEATE 10 MG PO TABS
10.0000 mg | ORAL_TABLET | Freq: Once | ORAL | Status: AC
  Administered 2016-01-02: 10 mg via ORAL

## 2016-01-02 MED ORDER — PROCHLORPERAZINE MALEATE 10 MG PO TABS
ORAL_TABLET | ORAL | Status: AC
  Filled 2016-01-02: qty 1

## 2016-01-02 MED ORDER — DIPHENHYDRAMINE HCL 25 MG PO CAPS
ORAL_CAPSULE | ORAL | Status: AC
  Filled 2016-01-02: qty 2

## 2016-01-02 NOTE — Progress Notes (Signed)
1300  Patient repeated her oxycodone and tramadol.    1335  Patient reports that her pain has eased off.  It is now a 2/10.

## 2016-01-02 NOTE — Patient Instructions (Addendum)
Natasha Jackson Discharge Instructions for Patients Receiving Chemotherapy  Today you received the following chemotherapy agents daratumumab and velcade.  To help prevent nausea and vomiting after your treatment, we encourage you to take your nausea medication.   If you develop nausea and vomiting that is not controlled by your nausea medication, call the clinic.   BELOW ARE SYMPTOMS THAT SHOULD BE REPORTED IMMEDIATELY:  *FEVER GREATER THAN 100.5 F  *CHILLS WITH OR WITHOUT FEVER  NAUSEA AND VOMITING THAT IS NOT CONTROLLED WITH YOUR NAUSEA MEDICATION  *UNUSUAL SHORTNESS OF BREATH  *UNUSUAL BRUISING OR BLEEDING  TENDERNESS IN MOUTH AND THROAT WITH OR WITHOUT PRESENCE OF ULCERS  *URINARY PROBLEMS  *BOWEL PROBLEMS  UNUSUAL RASH Items with * indicate a potential emergency and should be followed up as soon as possible.  Feel free to call the clinic you have any questions or concerns. The clinic phone number is [phone].  Please show the [facility] at check-in to the Emergency Department and triage nurse.  Bortezomib injection (Velcade) What is this medicine? BORTEZOMIB (bor TEZ oh mib) is a medicine that targets proteins in cancer cells and stops the cancer cells from growing. It is used to treat multiple myeloma and mantle-cell lymphoma. This medicine may be used for other purposes; ask your health care provider or pharmacist if you have questions. What should I tell my health care provider before I take this medicine? They need to know if you have any of these conditions: -diabetes -heart disease -irregular heartbeat -liver disease -on hemodialysis -low blood counts, like low white blood cells, platelets, or hemoglobin -peripheral neuropathy -taking medicine for blood pressure -an unusual or allergic reaction to bortezomib, mannitol, boron, other medicines, foods, dyes, or preservatives -pregnant or trying to get pregnant -breast-feeding How should  I use this medicine? This medicine is for injection into a vein or for injection under the skin. It is given by a health care professional in a hospital or clinic setting. Talk to your pediatrician regarding the use of this medicine in children. Special care may be needed. Overdosage: If you think you have taken too much of this medicine contact a poison control center or emergency room at once. NOTE: This medicine is only for you. Do not share this medicine with others. What if I miss a dose? It is important not to miss your dose. Call your doctor or health care professional if you are unable to keep an appointment. What may interact with this medicine? This medicine may interact with the following medications: -ketoconazole -rifampin -ritonavir -St. John's Wort This list may not describe all possible interactions. Give your health care provider a list of all the medicines, herbs, non-prescription drugs, or dietary supplements you use. Also tell them if you smoke, drink alcohol, or use illegal drugs. Some items may interact with your medicine. What should I watch for while using this medicine? Visit your doctor for checks on your progress. This drug may make you feel generally unwell. This is not uncommon, as chemotherapy can affect healthy cells as well as cancer cells. Report any side effects. Continue your course of treatment even though you feel ill unless your doctor tells you to stop. You may get drowsy or dizzy. Do not drive, use machinery, or do anything that needs mental alertness until you know how this medicine affects you. Do not stand or sit up quickly, especially if you are an older patient. This reduces the risk of dizzy or fainting spells. In  some cases, you may be given additional medicines to help with side effects. Follow all directions for their use. Call your doctor or health care professional for advice if you get a fever, chills or sore throat, or other symptoms of a cold or  flu. Do not treat yourself. This drug decreases your body's ability to fight infections. Try to avoid being around people who are sick. This medicine may increase your risk to bruise or bleed. Call your doctor or health care professional if you notice any unusual bleeding. You may need blood work done while you are taking this medicine. In some patients, this medicine may cause a serious brain infection that may cause death. If you have any problems seeing, thinking, speaking, walking, or standing, tell your doctor right away. If you cannot reach your doctor, urgently seek other source of medical care. Do not become pregnant while taking this medicine. Women should inform their doctor if they wish to become pregnant or think they might be pregnant. There is a potential for serious side effects to an unborn child. Talk to your health care professional or pharmacist for more information. Do not breast-feed an infant while taking this medicine. Check with your doctor or health care professional if you get an attack of severe diarrhea, nausea and vomiting, or if you sweat a lot. The loss of too much body fluid can make it dangerous for you to take this medicine. What side effects may I notice from receiving this medicine? Side effects that you should report to your doctor or health care professional as soon as possible: -allergic reactions like skin rash, itching or hives, swelling of the face, lips, or tongue -breathing problems -changes in hearing -changes in vision -fast, irregular heartbeat -feeling faint or lightheaded, falls -pain, tingling, numbness in the hands or feet -right upper belly pain -seizures -swelling of the ankles, feet, hands -unusual bleeding or bruising -unusually weak or tired -vomiting -yellowing of the eyes or skin Side effects that usually do not require medical attention (report to your doctor or health care professional if they continue or are bothersome): -changes in  emotions or moods -constipation -diarrhea -loss of appetite -headache -irritation at site where injected -nausea This list may not describe all possible side effects. Call your doctor for medical advice about side effects. You may report side effects to FDA at 1-800-FDA-1088. Where should I keep my medicine? This drug is given in a hospital or clinic and will not be stored at home. NOTE: This sheet is a summary. It may not cover all possible information. If you have questions about this medicine, talk to your doctor, pharmacist, or health care provider.    2016, [employer]vier/Gold Standard. (2014-08-21 14:47:04)  Daratumumab injection Darzalex What is this medicine? DARATUMUMAB (dar a toom ue mab) is a monoclonal antibody. It is used to treat multiple myeloma. This medicine may be used for other purposes; ask your health care provider or pharmacist if you have questions. What should I tell my health care provider before I take this medicine? They need to know if you have any of these conditions: -infection (especially a virus infection such as chickenpox, cold sores, or herpes) -lung or breathing disease -pregnant or trying to get pregnant -breast-feeding -an unusual or allergic reaction to daratumumab, other medicines, foods, dyes, or preservatives How should I use this medicine? This medicine is for infusion into a vein. It is given by a health care professional in a hospital or clinic setting. Talk to your  pediatrician regarding the use of this medicine in children. Special care may be needed. Overdosage: If you think you have taken too much of this medicine contact a poison control center or emergency room at once. NOTE: This medicine is only for you. Do not share this medicine with others. What if I miss a dose? Keep appointments for follow-up doses as directed. It is important not to miss your dose. Call your doctor or health care professional if you are unable to keep an  appointment. What may interact with this medicine? Interactions have not been studied. Give your health care provider a list of all the medicines, herbs, non-prescription drugs, or dietary supplements you use. Also tell them if you smoke, drink alcohol, or use illegal drugs. Some items may interact with your medicine. This list may not describe all possible interactions. Give your health care provider a list of all the medicines, herbs, non-prescription drugs, or dietary supplements you use. Also tell them if you smoke, drink alcohol, or use illegal drugs. Some items may interact with your medicine. What should I watch for while using this medicine? This drug may make you feel generally unwell. Report any side effects. Continue your course of treatment even though you feel ill unless your doctor tells you to stop. This medicine can cause serious allergic reactions. To reduce your risk you may need to take medicine before treatment with this medicine. Take your medicine as directed. This medicine can affect the results of blood tests to match your blood type. These changes can last for up to 6 months after the final dose. Your healthcare provider will do blood tests to match your blood type before you start treatment. Tell all of your healthcare providers that you are being treated with this medicine before receiving a blood transfusion. This medicine can affect the results of some tests used to determine treatment response; extra tests may be needed to evaluate response. Do not become pregnant while taking this medicine or for 3 months after stopping it. Women should inform their doctor if they wish to become pregnant or think they might be pregnant. There is a potential for serious side effects to an unborn child. Talk to your health care professional or pharmacist for more information. What side effects may I notice from receiving this medicine? Side effects that you should report to your doctor or  health care professional as soon as possible: -allergic reactions like skin rash, itching or hives, swelling of the face, lips, or tongue -breathing problems -chills -cough -dizziness -feeling faint or lightheaded -headache -nausea, vomiting -shortness of breath Side effects that usually do not require medical attention (Report these to your doctor or health care professional if they continue or are bothersome.): -back pain -fever -joint pain -loss of appetite -tiredness This list may not describe all possible side effects. Call your doctor for medical advice about side effects. You may report side effects to FDA at 1-800-FDA-1088. Where should I keep my medicine? Keep out of the reach of children. This drug is given in a hospital or clinic and will not be stored at home. NOTE: This sheet is a summary. It may not cover all possible information. If you have questions about this medicine, talk to your doctor, pharmacist, or health care provider.    2016, Elsevier/Gold Standard. (2014-08-21 17:02:23)

## 2016-01-02 NOTE — Progress Notes (Signed)
Pt c/o GI distress including nausea, gas, bloating and abd cramping after taking Acyclovir.  Notified Dr. Alvy Bimler and she instructs for pt to try to take Acyclovir as directed, but may decrease to 1/2 pill daily or 1 pill every other day.  She also prescribed compazine and zofran to take as needed for nausea. S/w pt in Infusion room and instructed pt to try taking zofran one hour prior to taking Acyclovir and decrease dose to 1/2 tablet daily (if tablets can be broken in half) or 1 tablet every other day (if tablet cannot be cut in half).   Pt verbalized understanding.

## 2016-01-08 ENCOUNTER — Other Ambulatory Visit: Payer: Self-pay | Admitting: *Deleted

## 2016-01-08 ENCOUNTER — Other Ambulatory Visit: Payer: Self-pay | Admitting: Hematology and Oncology

## 2016-01-08 DIAGNOSIS — C9002 Multiple myeloma in relapse: Secondary | ICD-10-CM

## 2016-01-08 MED ORDER — OXYCODONE HCL 15 MG PO TABS: 15.0000 mg | ORAL_TABLET | ORAL | Status: DC | PRN

## 2016-01-08 MED ORDER — OXYCODONE-ACETAMINOPHEN 5-325 MG PO TABS: 2.0000 | ORAL_TABLET | Freq: Four times a day (QID) | ORAL | Status: DC | PRN

## 2016-01-08 NOTE — Telephone Encounter (Signed)
Pt reports severe lower back pain, "can barely move in middle of night, oxycodone only takes the edge off the pain". Wonders if she needs another MRI.

## 2016-01-09 ENCOUNTER — Other Ambulatory Visit (HOSPITAL_BASED_OUTPATIENT_CLINIC_OR_DEPARTMENT_OTHER): Payer: Medicare Other

## 2016-01-09 ENCOUNTER — Ambulatory Visit: Payer: Medicare Other

## 2016-01-09 ENCOUNTER — Encounter: Payer: Self-pay | Admitting: Hematology and Oncology

## 2016-01-09 ENCOUNTER — Ambulatory Visit (HOSPITAL_BASED_OUTPATIENT_CLINIC_OR_DEPARTMENT_OTHER): Payer: Medicare Other | Admitting: Hematology and Oncology

## 2016-01-09 ENCOUNTER — Ambulatory Visit (HOSPITAL_BASED_OUTPATIENT_CLINIC_OR_DEPARTMENT_OTHER): Payer: Medicare Other

## 2016-01-09 ENCOUNTER — Telehealth: Payer: Self-pay | Admitting: Hematology and Oncology

## 2016-01-09 ENCOUNTER — Other Ambulatory Visit: Payer: Self-pay | Admitting: Hematology and Oncology

## 2016-01-09 VITALS — BP 127/61 | HR 84 | Temp 98.7°F | Resp 18

## 2016-01-09 VITALS — BP 125/51 | HR 75 | Temp 98.0°F | Resp 18 | Wt 114.1 lb

## 2016-01-09 DIAGNOSIS — C9002 Multiple myeloma in relapse: Secondary | ICD-10-CM

## 2016-01-09 DIAGNOSIS — C9000 Multiple myeloma not having achieved remission: Secondary | ICD-10-CM

## 2016-01-09 DIAGNOSIS — D6481 Anemia due to antineoplastic chemotherapy: Secondary | ICD-10-CM

## 2016-01-09 DIAGNOSIS — Z5112 Encounter for antineoplastic immunotherapy: Secondary | ICD-10-CM | POA: Diagnosis present

## 2016-01-09 DIAGNOSIS — M549 Dorsalgia, unspecified: Secondary | ICD-10-CM | POA: Diagnosis not present

## 2016-01-09 DIAGNOSIS — T451X5A Adverse effect of antineoplastic and immunosuppressive drugs, initial encounter: Secondary | ICD-10-CM

## 2016-01-09 DIAGNOSIS — G620 Drug-induced polyneuropathy: Secondary | ICD-10-CM

## 2016-01-09 DIAGNOSIS — K219 Gastro-esophageal reflux disease without esophagitis: Secondary | ICD-10-CM

## 2016-01-09 DIAGNOSIS — M129 Arthropathy, unspecified: Secondary | ICD-10-CM

## 2016-01-09 DIAGNOSIS — IMO0001 Reserved for inherently not codable concepts without codable children: Secondary | ICD-10-CM

## 2016-01-09 DIAGNOSIS — D630 Anemia in neoplastic disease: Secondary | ICD-10-CM | POA: Insufficient documentation

## 2016-01-09 DIAGNOSIS — M4850XS Collapsed vertebra, not elsewhere classified, site unspecified, sequela of fracture: Secondary | ICD-10-CM

## 2016-01-09 DIAGNOSIS — G8929 Other chronic pain: Secondary | ICD-10-CM

## 2016-01-09 DIAGNOSIS — C9001 Multiple myeloma in remission: Secondary | ICD-10-CM

## 2016-01-09 LAB — CBC WITH DIFFERENTIAL/PLATELET
BASO%: 0.3 % (ref 0.0–2.0)
BASOS ABS: 0 10*3/uL (ref 0.0–0.1)
EOS%: 0.1 % (ref 0.0–7.0)
Eosinophils Absolute: 0 10*3/uL (ref 0.0–0.5)
HEMATOCRIT: 28.1 % — AB (ref 34.8–46.6)
HEMOGLOBIN: 9.5 g/dL — AB (ref 11.6–15.9)
LYMPH#: 0.7 10*3/uL — AB (ref 0.9–3.3)
LYMPH%: 14.6 % (ref 14.0–49.7)
MCH: 35.1 pg — AB (ref 25.1–34.0)
MCHC: 33.7 g/dL (ref 31.5–36.0)
MCV: 104.2 fL — ABNORMAL HIGH (ref 79.5–101.0)
MONO#: 0.1 10*3/uL (ref 0.1–0.9)
MONO%: 1.3 % (ref 0.0–14.0)
NEUT#: 3.8 10*3/uL (ref 1.5–6.5)
NEUT%: 83.7 % — ABNORMAL HIGH (ref 38.4–76.8)
PLATELETS: 167 10*3/uL (ref 145–400)
RBC: 2.69 10*6/uL — ABNORMAL LOW (ref 3.70–5.45)
RDW: 16.3 % — AB (ref 11.2–14.5)
WBC: 4.5 10*3/uL (ref 3.9–10.3)

## 2016-01-09 LAB — COMPREHENSIVE METABOLIC PANEL
ALBUMIN: 3.5 g/dL (ref 3.5–5.0)
ALT: 17 U/L (ref 0–55)
ANION GAP: 9 meq/L (ref 3–11)
AST: 21 U/L (ref 5–34)
Alkaline Phosphatase: 77 U/L (ref 40–150)
BUN: 27.2 mg/dL — AB (ref 7.0–26.0)
CALCIUM: 9.4 mg/dL (ref 8.4–10.4)
CO2: 24 meq/L (ref 22–29)
CREATININE: 0.9 mg/dL (ref 0.6–1.1)
Chloride: 101 mEq/L (ref 98–109)
EGFR: 61 mL/min/{1.73_m2} — ABNORMAL LOW (ref >90)
GLUCOSE: 107 mg/dL (ref 70–140)
POTASSIUM: 4.3 meq/L (ref 3.5–5.1)
Sodium: 133 mEq/L — ABNORMAL LOW (ref 136–145)
Total Bilirubin: <0.3 mg/dL (ref 0.20–1.20)
Total Protein: 8.8 g/dL — ABNORMAL HIGH (ref 6.4–8.3)

## 2016-01-09 LAB — URIC ACID: Uric Acid, Serum: 5.2 mg/dl (ref 2.6–7.4)

## 2016-01-09 MED ORDER — PROCHLORPERAZINE MALEATE 10 MG PO TABS
ORAL_TABLET | ORAL | Status: AC
  Filled 2016-01-09: qty 1

## 2016-01-09 MED ORDER — SODIUM CHLORIDE 0.9% FLUSH
10.0000 mL | INTRAVENOUS | Status: DC | PRN
  Administered 2016-01-09: 10 mL
  Filled 2016-01-09: qty 10

## 2016-01-09 MED ORDER — ZOLEDRONIC ACID 4 MG/5ML IV CONC
3.3000 mg | Freq: Once | INTRAVENOUS | Status: AC
  Administered 2016-01-09: 3.3 mg via INTRAVENOUS
  Filled 2016-01-09: qty 4.13

## 2016-01-09 MED ORDER — ACETAMINOPHEN 325 MG PO TABS
ORAL_TABLET | ORAL | Status: AC
  Filled 2016-01-09: qty 2

## 2016-01-09 MED ORDER — METHYLPREDNISOLONE SODIUM SUCC 125 MG IJ SOLR
INTRAMUSCULAR | Status: AC
  Filled 2016-01-09: qty 2

## 2016-01-09 MED ORDER — MONTELUKAST SODIUM 10 MG PO TABS
10.0000 mg | ORAL_TABLET | Freq: Once | ORAL | Status: AC
  Administered 2016-01-09: 10 mg via ORAL
  Filled 2016-01-09: qty 1

## 2016-01-09 MED ORDER — PROCHLORPERAZINE MALEATE 10 MG PO TABS
10.0000 mg | ORAL_TABLET | Freq: Once | ORAL | Status: AC
  Administered 2016-01-09: 10 mg via ORAL

## 2016-01-09 MED ORDER — OMEPRAZOLE 20 MG PO CPDR: 20.0000 mg | DELAYED_RELEASE_CAPSULE | Freq: Every day | ORAL | Status: DC

## 2016-01-09 MED ORDER — SODIUM CHLORIDE 0.9 % IV SOLN
Freq: Once | INTRAVENOUS | Status: AC
  Administered 2016-01-09: 10:00:00 via INTRAVENOUS

## 2016-01-09 MED ORDER — BORTEZOMIB CHEMO SQ INJECTION 3.5 MG (2.5MG/ML)
0.9750 mg/m2 | Freq: Once | INTRAMUSCULAR | Status: AC
  Administered 2016-01-09: 1.5 mg via SUBCUTANEOUS
  Filled 2016-01-09: qty 1.5

## 2016-01-09 MED ORDER — HEPARIN SOD (PORK) LOCK FLUSH 100 UNIT/ML IV SOLN
500.0000 [IU] | Freq: Once | INTRAVENOUS | Status: AC | PRN
  Administered 2016-01-09: 500 [IU]
  Filled 2016-01-09: qty 5

## 2016-01-09 MED ORDER — TRAMADOL HCL 50 MG PO TABS: 100.0000 mg | ORAL_TABLET | Freq: Four times a day (QID) | ORAL | Status: DC | PRN

## 2016-01-09 MED ORDER — METHYLPREDNISOLONE SODIUM SUCC 125 MG IJ SOLR
125.0000 mg | Freq: Once | INTRAMUSCULAR | Status: AC
  Administered 2016-01-09: 125 mg via INTRAVENOUS

## 2016-01-09 MED ORDER — ACETAMINOPHEN 325 MG PO TABS
650.0000 mg | ORAL_TABLET | Freq: Once | ORAL | Status: AC
  Administered 2016-01-09: 650 mg via ORAL

## 2016-01-09 MED ORDER — DIPHENHYDRAMINE HCL 25 MG PO CAPS
ORAL_CAPSULE | ORAL | Status: AC
  Filled 2016-01-09: qty 2

## 2016-01-09 MED ORDER — DIPHENHYDRAMINE HCL 25 MG PO CAPS
50.0000 mg | ORAL_CAPSULE | Freq: Once | ORAL | Status: AC
  Administered 2016-01-09: 50 mg via ORAL

## 2016-01-09 MED ORDER — SODIUM CHLORIDE 0.9 % IV SOLN
15.2000 mg/kg | Freq: Once | INTRAVENOUS | Status: AC
  Administered 2016-01-09: 800 mg via INTRAVENOUS
  Filled 2016-01-09: qty 40

## 2016-01-09 NOTE — Assessment & Plan Note (Signed)
So far, she tolerated treatment well without major side effects. She continues to have severe back pain. I will proceed to order MRI spine to rule out additional compression fractures I will continue current dose treatment and plan to reorder myeloma panel at the end of the month to assess response to treatment She will continue pulsed dexamethasone every week, Daratumumab every week along with Velcade injection. I reinforced the importance of calcium and vitamin D supplementation. She will continue Zometa every 3 months. I reinforced the importance of acyclovir while on treatment

## 2016-01-09 NOTE — Patient Instructions (Addendum)
[facility] Discharge Instructions for Patients Receiving Chemotherapy  Today you received the following chemotherapy agents, Darzalex and Velcade. You also received a Zometa infusion.  To help prevent nausea and vomiting after your treatment, we encourage you to take your nausea medication.   If you develop nausea and vomiting that is not controlled by your nausea medication, call the clinic.   BELOW ARE SYMPTOMS THAT SHOULD BE REPORTED IMMEDIATELY:  *FEVER GREATER THAN 100.5 F  *CHILLS WITH OR WITHOUT FEVER  NAUSEA AND VOMITING THAT IS NOT CONTROLLED WITH YOUR NAUSEA MEDICATION  *UNUSUAL SHORTNESS OF BREATH  *UNUSUAL BRUISING OR BLEEDING  TENDERNESS IN MOUTH AND THROAT WITH OR WITHOUT PRESENCE OF ULCERS  *URINARY PROBLEMS  *BOWEL PROBLEMS  UNUSUAL RASH Items with * indicate a potential emergency and should be followed up as soon as possible.  Feel free to call the clinic you have any questions or concerns. The clinic phone number is [phone].  Please show the [facility] at check-in to the Emergency Department and triage nurse.

## 2016-01-09 NOTE — Assessment & Plan Note (Signed)
She has multiple compression fractures. She is on vitamin D, calcium and Zometa. She had mild worsening back pain which she suspect could be due to new injuries. I reinforced the importance of wearing a thoracic brace. I will order MRI for further evaluation

## 2016-01-09 NOTE — Telephone Encounter (Signed)
Gave and printed appt sched and avs for pt for July and Aug °

## 2016-01-09 NOTE — Progress Notes (Signed)
[facility] OFFICE PROGRESS NOTE  Patient Care Team: Thressa Sheller, MD as PCP - General (Internal Medicine) Thressa Sheller, MD (Internal Medicine) Renella Cunas, MD as Attending Physician (Cardiology) Heath Lark, MD as Consulting Physician (Hematology and Oncology) Luanne Bras, MD as Consulting Physician (Interventional Radiology)  SUMMARY OF ONCOLOGIC HISTORY: Oncology History   The     Multiple myeloma in relapse [facility])   11/12/2008 Imaging Skeletal survey showed lytic lesion in the right femur compatible with  myeloma. There were Questionable skull lesions   11/15/2008 Bone Marrow Biopsy BONE MARROW ASPIRATE AND BIOPSY: showed NORMOCELLULAR MARROW FOR AGE WITH PLASMA CELL DYSCRASIA  (PLASMA CELLS 25%). 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 Subsequently she was placed on Revlimid & Dexamethasone alone   05/12/2013 Bone Marrow Biopsy Bone marrow biopsy showed persistent plasma cells. Blood work confirmed VGPR status   06/11/2014 Tumor Marker Bloodwork show disease relapse   06/26/2014 Procedure She has placement of port   07/05/2014 - 07/26/2014 Chemotherapy She received Elotuzumab and Revlimid. Rx was discontinued with elotuzumab per patient preference   08/01/2014 - 08/05/2014 Hospital Admission She was admitted to the hospital for kyphoplasty and pain management after traumatic compression fracture   08/23/2014 - 04/18/2015 Chemotherapy She was placed on dexamethasone & Revlimid   04/11/2015 Tumor Marker Blood work showed VGPR. Treatment was discontinued and she is maintained on Zometa only   10/20/2015 Imaging MRI spine showed acute/subacute superior endplate compression fractures at T2 and T9 are incompletely healed.    11/05/2015 Procedure Status post vertebral body augmentation using balloon kyphoplasty at T9 as  described without event   12/27/2015 Procedure She had port placement   01/02/2016 -  Chemotherapy She received Daratumumab, Dexamethasone and Velcade    INTERVAL HISTORY: Please see below for problem oriented charting. She is seen prior to week 2 of treatment. She tolerated the treatment well without any side effects. Denies worsening neuropathy. She complained of worsening back pain. The dose of her pain medicine is increased to IR oxycodone 15 mg as needed along with tramadol as needed She felt that she might have twisted and injured her thoracic chest wall recently. She denies recent infection The patient denies any recent signs or symptoms of bleeding such as spontaneous epistaxis, hematuria or hematochezia.  REVIEW OF SYSTEMS:   Constitutional: Denies fevers, chills or abnormal weight loss Eyes: Denies blurriness of vision Ears, nose, mouth, throat, and face: Denies mucositis or sore throat Respiratory: Denies cough, dyspnea or wheezes Cardiovascular: Denies palpitation, chest discomfort or lower extremity swelling Gastrointestinal:  Denies nausea, heartburn or change in bowel habits Skin: Denies abnormal skin rashes Lymphatics: Denies new lymphadenopathy or easy bruising Neurological:Denies numbness, tingling or new weaknesses Behavioral/Psych: Mood is stable, no new changes  All other systems were reviewed with the patient and are negative.  I have reviewed the past medical history, past surgical history, social history and family history with the patient and they are unchanged from previous note.  ALLERGIES:  has No Known Allergies.  MEDICATIONS:  Current Outpatient Prescriptions  Medication Sig Dispense Refill  . acyclovir (ZOVIRAX) 400 MG tablet Take 1 tablet (400 mg total) by mouth daily. 60 tablet 11  . aspirin 325 MG EC tablet Take 325 mg by mouth daily.    . cholecalciferol (VITAMIN D) 1000 UNITS tablet Take 1,000 Units by mouth daily.    . cyclobenzaprine (  FLEXERIL)  5 MG tablet Take 1 tablet (5 mg total) by mouth 3 (three) times daily as needed for muscle spasms. 30 tablet 0  . dexamethasone (DECADRON) 4 MG tablet 20 mg weekly on Thursday 20 tablet 4  . gabapentin (NEURONTIN) 300 MG capsule TAKE ONE CAPSULE BY MOUTH THREE TIMES DAILY 90 capsule 3  . levothyroxine (SYNTHROID, LEVOTHROID) 50 MCG tablet Take 50 mcg by mouth daily before breakfast.    . mirtazapine (REMERON) 7.5 MG tablet TAKE ONE TABLET BY MOUTH NIGHTLY AT BEDTIME 30 tablet 3  . Multiple Vitamin (MULTIVITAMIN WITH MINERALS) TABS tablet Take 1 tablet by mouth daily.    [name] oxyCODONE (ROXICODONE) 15 MG immediate release tablet Take 1 tablet (15 mg total) by mouth every 4 (four) hours as needed for severe pain. 60 tablet 0  . Polyvinyl Alcohol-Povidone (REFRESH OP) Place 2 drops into both eyes 2 (two) times daily as needed (dry eyes).    . traMADol (ULTRAM) 50 MG tablet Take 2 tablets (100 mg total) by mouth every 6 (six) hours as needed for moderate pain (one to two tablets every 6 hrs as needed.). 90 tablet 0  . valsartan (DIOVAN) 160 MG tablet Take 1 tablet (160 mg total) by mouth daily. 30 tablet 3  . diphenhydramine-acetaminophen (TYLENOL PM) 25-500 MG TABS Take 1 tablet by mouth at bedtime as needed (Sleep). Reported on 01/09/2016    . loperamide (IMODIUM A-D) 2 MG tablet Take 2 mg by mouth as needed for diarrhea or loose stools (as directed.). Reported on 01/09/2016    . omeprazole (PRILOSEC) 20 MG capsule Take 1 capsule (20 mg total) by mouth daily. 90 capsule 1  . ondansetron (ZOFRAN) 8 MG tablet Take 1 tablet (8 mg total) by mouth every 8 (eight) hours as needed for nausea or vomiting. (Patient not taking: Reported on 01/09/2016) 60 tablet 3  . oxyCODONE-acetaminophen (PERCOCET/ROXICET) 5-325 MG tablet Take 2 tablets by mouth every 6 (six) hours as needed for severe pain. (Patient not taking: Reported on 01/09/2016) 60 tablet 0  . prochlorperazine (COMPAZINE) 10 MG tablet Take 1 tablet (10 mg total) by  mouth every 6 (six) hours as needed for nausea or vomiting. (Patient not taking: Reported on 01/09/2016) 60 tablet 3   No current facility-administered medications for this visit.   Facility-Administered Medications Ordered in Other Visits  Medication Dose Route Frequency Provider Last Rate Last Dose  . bortezomib SQ (VELCADE) chemo injection 1.5 mg  0.975 mg/m2 (Treatment Plan Actual) Subcutaneous Once Heath Lark, MD      . daratumumab (DARZALEX) 800 mg in sodium chloride 0.9 % 460 mL (1.6 mg/mL) chemo infusion  15.2 mg/kg (Treatment Plan Actual) Intravenous Once Heath Lark, MD      . heparin lock flush 100 unit/mL  500 Units Intracatheter Once PRN Heath Lark, MD      . sodium chloride flush (NS) 0.9 % injection 10 mL  10 mL Intracatheter PRN Heath Lark, MD        PHYSICAL EXAMINATION: ECOG PERFORMANCE STATUS: 1 - Symptomatic but completely ambulatory  Filed Vitals:   01/09/16 0851  BP: 125/51  Pulse: 75  Temp: 98 F (36.7 C)  Resp: 18   Filed Weights   01/09/16 0851  Weight: 114 lb 1.6 oz (51.755 kg)    GENERAL:alert, no distress and comfortable . She appears nervous. She is thin SKIN: skin color, texture, turgor are normal, no rashes or significant lesions EYES: normal, Conjunctiva are pink and non-injected, sclera clear OROPHARYNX:no  exudate, no erythema and lips, buccal mucosa, and tongue normal  Musculoskeletal:no cyanosis of digits and no clubbing  NEURO: alert & oriented x 3 with fluent speech, no focal motor/sensory deficits  LABORATORY DATA:  I have reviewed the data as listed    Component Value Date/Time   NA 133* 01/09/2016 0811   NA 136 11/05/2015 0700   K 4.3 01/09/2016 0811   K 3.9 11/05/2015 0700   CL 100* 11/05/2015 0700   CL 103 11/25/2012 1328   CO2 24 01/09/2016 0811   CO2 27 11/05/2015 0700   GLUCOSE 107 01/09/2016 0811   GLUCOSE 78 11/05/2015 0700   GLUCOSE 91 11/25/2012 1328   BUN 27.2* 01/09/2016 0811   BUN 19 11/05/2015 0700   CREATININE 0.9  01/09/2016 0811   CREATININE 0.88 11/05/2015 0700   CALCIUM 9.4 01/09/2016 0811   CALCIUM 9.6 11/05/2015 0700   PROT 8.8* 01/09/2016 0811   PROT 7.9 12/16/2015 1115   PROT 5.7* 09/21/2014 0725   ALBUMIN 3.5 01/09/2016 0811   ALBUMIN 3.0* 09/21/2014 0725   AST 21 01/09/2016 0811   AST 22 09/21/2014 0725   ALT 17 01/09/2016 0811   ALT 17 09/21/2014 0725   ALKPHOS 77 01/09/2016 0811   ALKPHOS 43 09/21/2014 0725   BILITOT <0.30 01/09/2016 0811   BILITOT 0.7 09/21/2014 0725   GFRNONAA 57* 11/05/2015 0700   GFRAA >60 11/05/2015 0700    No results found for: SPEP, UPEP  Lab Results  Component Value Date   WBC 4.5 01/09/2016   NEUTROABS 3.8 01/09/2016   HGB 9.5* 01/09/2016   HCT 28.1* 01/09/2016   MCV 104.2* 01/09/2016   PLT 167 01/09/2016      Chemistry      Component Value Date/Time   NA 133* 01/09/2016 0811   NA 136 11/05/2015 0700   K 4.3 01/09/2016 0811   K 3.9 11/05/2015 0700   CL 100* 11/05/2015 0700   CL 103 11/25/2012 1328   CO2 24 01/09/2016 0811   CO2 27 11/05/2015 0700   BUN 27.2* 01/09/2016 0811   BUN 19 11/05/2015 0700   CREATININE 0.9 01/09/2016 0811   CREATININE 0.88 11/05/2015 0700      Component Value Date/Time   CALCIUM 9.4 01/09/2016 0811   CALCIUM 9.6 11/05/2015 0700   ALKPHOS 77 01/09/2016 0811   ALKPHOS 43 09/21/2014 0725   AST 21 01/09/2016 0811   AST 22 09/21/2014 0725   ALT 17 01/09/2016 0811   ALT 17 09/21/2014 0725   BILITOT <0.30 01/09/2016 0811   BILITOT 0.7 09/21/2014 0725      ASSESSMENT & PLAN:  Multiple myeloma in relapse (HCC) So far, she tolerated treatment well without major side effects. She continues to have severe back pain. I will proceed to order MRI spine to rule out additional compression fractures I will continue current dose treatment and plan to reorder myeloma panel at the end of the month to assess response to treatment She will continue pulsed dexamethasone every week, Daratumumab every week along with  Velcade injection. I reinforced the importance of calcium and vitamin D supplementation. She will continue Zometa every 3 months. I reinforced the importance of acyclovir while on treatment  Anemia in neoplastic disease This is likely due to recent treatment. The patient denies recent history of bleeding such as epistaxis, hematuria or hematochezia. She is asymptomatic from the anemia. I will observe for now.  She does not require transfusion now. I will continue the chemotherapy at current  dose without dosage adjustment.  If the anemia gets progressive worse in the future, I might have to delay her treatment or adjust the chemotherapy dose.   Neuropathy due to chemotherapeutic drug She had history of neuropathy. I will prescribe reduced dose Velcade So far she denies worsening neuropathy  Chronic back pain greater than 3 months duration She has multiple compression fractures. She is on vitamin D, calcium and Zometa. She had mild worsening back pain which she suspect could be due to new injuries. I reinforced the importance of wearing a thoracic brace. I will order MRI for further evaluation   Orders Placed This Encounter  Procedures  . Kappa/lambda light chains    Standing Status: Future     Number of Occurrences:      Standing Expiration Date: 02/12/2017  . Multiple Myeloma Panel (SPEP&IFE w/QIG)    Standing Status: Future     Number of Occurrences:      Standing Expiration Date: 02/12/2017   All questions were answered. The patient knows to call the clinic with any problems, questions or concerns. No barriers to learning was detected. I spent 20 minutes counseling the patient face to face. The total time spent in the appointment was 30 minutes and more than 50% was on counseling and review of test results     [facility], [place], MD 01/09/2016 10:29 AM

## 2016-01-09 NOTE — Assessment & Plan Note (Signed)

## 2016-01-09 NOTE — Assessment & Plan Note (Signed)
She had history of neuropathy. I will prescribe reduced dose Velcade So far she denies worsening neuropathy

## 2016-01-11 ENCOUNTER — Other Ambulatory Visit: Payer: Self-pay | Admitting: Hematology and Oncology

## 2016-01-13 ENCOUNTER — Telehealth: Payer: Self-pay | Admitting: *Deleted

## 2016-01-13 NOTE — Telephone Encounter (Signed)
Pt lvm on Friday requesting Dr. Alvy Bimler change MRI order to Open MRI d/t claustrophobia.   Called pt back and she had already been able to arrange open MRI appt at [facility] on Sunday 7/16.   They told her 7/16 is the first available appt for Open MRI.   She asks if any sooner appts available at [facility] or [facility]?  I called MRI and they state the only open MRI machine is at [facility].  Pt will keep this appt as scheduled.  Pt reports "a lot of pain" and unable to "hardly move" w/o pain.  She continues to take tramadol and occasionally one oxycodone as needed.  She continues to c/o constipation with the oxycodone so she doesn't like to take it.  She is taking Miralax once daily and dulcolax as needed.  Instructed pt to add senokot-s 2 tabs twice daily and also may take miralax twice daily if needed.  Pt verbalized understanding.

## 2016-01-16 ENCOUNTER — Other Ambulatory Visit: Payer: Self-pay | Admitting: Hematology and Oncology

## 2016-01-16 ENCOUNTER — Ambulatory Visit (HOSPITAL_BASED_OUTPATIENT_CLINIC_OR_DEPARTMENT_OTHER): Payer: Medicare Other

## 2016-01-16 ENCOUNTER — Ambulatory Visit: Payer: Medicare Other

## 2016-01-16 ENCOUNTER — Other Ambulatory Visit (HOSPITAL_BASED_OUTPATIENT_CLINIC_OR_DEPARTMENT_OTHER): Payer: Medicare Other

## 2016-01-16 VITALS — BP 124/45 | HR 78 | Temp 98.7°F | Resp 18

## 2016-01-16 DIAGNOSIS — Z5112 Encounter for antineoplastic immunotherapy: Secondary | ICD-10-CM

## 2016-01-16 DIAGNOSIS — M546 Pain in thoracic spine: Secondary | ICD-10-CM

## 2016-01-16 DIAGNOSIS — Z95828 Presence of other vascular implants and grafts: Secondary | ICD-10-CM

## 2016-01-16 DIAGNOSIS — C9002 Multiple myeloma in relapse: Secondary | ICD-10-CM | POA: Diagnosis present

## 2016-01-16 DIAGNOSIS — C9001 Multiple myeloma in remission: Secondary | ICD-10-CM

## 2016-01-16 LAB — COMPREHENSIVE METABOLIC PANEL
ALBUMIN: 3.2 g/dL — AB (ref 3.5–5.0)
ALT: 13 U/L (ref 0–55)
AST: 25 U/L (ref 5–34)
Alkaline Phosphatase: 71 U/L (ref 40–150)
Anion Gap: 9 mEq/L (ref 3–11)
BUN: 16.1 mg/dL (ref 7.0–26.0)
CO2: 23 meq/L (ref 22–29)
Calcium: 8.9 mg/dL (ref 8.4–10.4)
Chloride: 100 mEq/L (ref 98–109)
Creatinine: 0.8 mg/dL (ref 0.6–1.1)
EGFR: 70 mL/min/{1.73_m2} — ABNORMAL LOW (ref >90)
GLUCOSE: 91 mg/dL (ref 70–140)
Potassium: 4.5 mEq/L (ref 3.5–5.1)
SODIUM: 133 meq/L — AB (ref 136–145)
TOTAL PROTEIN: 8.4 g/dL — AB (ref 6.4–8.3)
Total Bilirubin: 0.55 mg/dL (ref 0.20–1.20)

## 2016-01-16 LAB — CBC WITH DIFFERENTIAL/PLATELET
BASO%: 0 % (ref 0.0–2.0)
Basophils Absolute: 0 10*3/uL (ref 0.0–0.1)
EOS%: 0.3 % (ref 0.0–7.0)
Eosinophils Absolute: 0 10*3/uL (ref 0.0–0.5)
HCT: 27.1 % — ABNORMAL LOW (ref 34.8–46.6)
HGB: 9.3 g/dL — ABNORMAL LOW (ref 11.6–15.9)
LYMPH%: 16.3 % (ref 14.0–49.7)
MCH: 34.7 pg — ABNORMAL HIGH (ref 25.1–34.0)
MCHC: 34.3 g/dL (ref 31.5–36.0)
MCV: 101.1 fL — ABNORMAL HIGH (ref 79.5–101.0)
MONO#: 0.1 10*3/uL (ref 0.1–0.9)
MONO%: 1.7 % (ref 0.0–14.0)
NEUT%: 81.7 % — AB (ref 38.4–76.8)
NEUTROS ABS: 2.4 10*3/uL (ref 1.5–6.5)
Platelets: 142 10*3/uL — ABNORMAL LOW (ref 145–400)
RBC: 2.68 10*6/uL — AB (ref 3.70–5.45)
RDW: 15.9 % — ABNORMAL HIGH (ref 11.2–14.5)
WBC: 2.9 10*3/uL — AB (ref 3.9–10.3)
lymph#: 0.5 10*3/uL — ABNORMAL LOW (ref 0.9–3.3)

## 2016-01-16 MED ORDER — SODIUM CHLORIDE 0.9 % IV SOLN: 16.0000 mg/kg | Freq: Once | INTRAVENOUS | Status: DC

## 2016-01-16 MED ORDER — SODIUM CHLORIDE 0.9% FLUSH
10.0000 mL | INTRAVENOUS | Status: DC | PRN
  Administered 2016-01-16: 10 mL via INTRAVENOUS
  Filled 2016-01-16: qty 10

## 2016-01-16 MED ORDER — METHYLPREDNISOLONE SODIUM SUCC 125 MG IJ SOLR
INTRAMUSCULAR | Status: AC
  Filled 2016-01-16: qty 2

## 2016-01-16 MED ORDER — MONTELUKAST SODIUM 10 MG PO TABS
10.0000 mg | ORAL_TABLET | Freq: Once | ORAL | Status: AC
Start: 2016-01-16 — End: 2016-01-16
  Administered 2016-01-16: 10 mg via ORAL
  Filled 2016-01-16: qty 1

## 2016-01-16 MED ORDER — SODIUM CHLORIDE 0.9 % IV SOLN
800.0000 mg | Freq: Once | INTRAVENOUS | Status: AC
  Administered 2016-01-16: 800 mg via INTRAVENOUS
  Filled 2016-01-16: qty 40

## 2016-01-16 MED ORDER — PROCHLORPERAZINE MALEATE 10 MG PO TABS
ORAL_TABLET | ORAL | Status: AC
  Filled 2016-01-16: qty 1

## 2016-01-16 MED ORDER — PROCHLORPERAZINE MALEATE 10 MG PO TABS: 10.0000 mg | ORAL_TABLET | Freq: Once | ORAL | Status: DC

## 2016-01-16 MED ORDER — ACETAMINOPHEN 325 MG PO TABS
650.0000 mg | ORAL_TABLET | Freq: Once | ORAL | Status: AC
  Administered 2016-01-16: 650 mg via ORAL

## 2016-01-16 MED ORDER — SODIUM CHLORIDE 0.9 % IV SOLN
Freq: Once | INTRAVENOUS | Status: AC
  Administered 2016-01-16: 10:00:00 via INTRAVENOUS

## 2016-01-16 MED ORDER — PROCHLORPERAZINE MALEATE 10 MG PO TABS
10.0000 mg | ORAL_TABLET | Freq: Once | ORAL | Status: AC
Start: 2016-01-16 — End: 2016-01-16
  Administered 2016-01-16: 10 mg via ORAL

## 2016-01-16 MED ORDER — ACETAMINOPHEN 325 MG PO TABS
ORAL_TABLET | ORAL | Status: AC
  Filled 2016-01-16: qty 2

## 2016-01-16 MED ORDER — BORTEZOMIB CHEMO SQ INJECTION 3.5 MG (2.5MG/ML)
0.9750 mg/m2 | Freq: Once | INTRAMUSCULAR | Status: AC
  Administered 2016-01-16: 1.5 mg via SUBCUTANEOUS
  Filled 2016-01-16: qty 1.5

## 2016-01-16 MED ORDER — METHYLPREDNISOLONE SODIUM SUCC 125 MG IJ SOLR
125.0000 mg | Freq: Once | INTRAMUSCULAR | Status: AC
  Administered 2016-01-16: 125 mg via INTRAVENOUS

## 2016-01-16 MED ORDER — DIPHENHYDRAMINE HCL 25 MG PO CAPS
50.0000 mg | ORAL_CAPSULE | Freq: Once | ORAL | Status: AC
  Administered 2016-01-16: 50 mg via ORAL

## 2016-01-16 MED ORDER — SODIUM CHLORIDE 0.9% FLUSH
10.0000 mL | INTRAVENOUS | Status: DC | PRN
  Administered 2016-01-16: 10 mL
  Filled 2016-01-16: qty 10

## 2016-01-16 MED ORDER — DIPHENHYDRAMINE HCL 25 MG PO CAPS
ORAL_CAPSULE | ORAL | Status: AC
  Filled 2016-01-16: qty 2

## 2016-01-16 MED ORDER — HEPARIN SOD (PORK) LOCK FLUSH 100 UNIT/ML IV SOLN
500.0000 [IU] | Freq: Once | INTRAVENOUS | Status: AC | PRN
  Administered 2016-01-16: 500 [IU]
  Filled 2016-01-16: qty 5

## 2016-01-16 NOTE — Patient Instructions (Signed)
[facility] Discharge Instructions for Patients Receiving Chemotherapy  Today you received the following chemotherapy agents: Velcade, Daratumumab.   To help prevent nausea and vomiting after your treatment, we encourage you to take your nausea medication: as directed.    If you develop nausea and vomiting that is not controlled by your nausea medication, call the clinic.   BELOW ARE SYMPTOMS THAT SHOULD BE REPORTED IMMEDIATELY:  *FEVER GREATER THAN 100.5 F  *CHILLS WITH OR WITHOUT FEVER  NAUSEA AND VOMITING THAT IS NOT CONTROLLED WITH YOUR NAUSEA MEDICATION  *UNUSUAL SHORTNESS OF BREATH  *UNUSUAL BRUISING OR BLEEDING  TENDERNESS IN MOUTH AND THROAT WITH OR WITHOUT PRESENCE OF ULCERS  *URINARY PROBLEMS  *BOWEL PROBLEMS  UNUSUAL RASH Items with * indicate a potential emergency and should be followed up as soon as possible.  Feel free to call the clinic you have any questions or concerns. The clinic phone number is [phone].  Please show the CHEMO ALERT CARD at check-in to the Emergency Department and triage nurse.   

## 2016-01-16 NOTE — Patient Instructions (Signed)

## 2016-01-19 ENCOUNTER — Inpatient Hospital Stay: Admission: RE | Admit: 2016-01-19 | Payer: Medicare Other | Source: Ambulatory Visit

## 2016-01-20 ENCOUNTER — Telehealth: Payer: Self-pay | Admitting: Hematology and Oncology

## 2016-01-20 ENCOUNTER — Other Ambulatory Visit: Payer: Self-pay | Admitting: *Deleted

## 2016-01-20 ENCOUNTER — Ambulatory Visit
Admission: RE | Admit: 2016-01-20 | Discharge: 2016-01-20 | Disposition: A | Discharge status: Home / Self Care | Payer: Medicare Other | Source: Ambulatory Visit | Attending: Hematology and Oncology | Admitting: Hematology and Oncology

## 2016-01-20 ENCOUNTER — Telehealth: Payer: Self-pay | Admitting: *Deleted

## 2016-01-20 DIAGNOSIS — M546 Pain in thoracic spine: Secondary | ICD-10-CM

## 2016-01-20 MED ORDER — OXYCODONE-ACETAMINOPHEN 5-325 MG PO TABS: 2.0000 | ORAL_TABLET | Freq: Four times a day (QID) | ORAL | Status: DC | PRN

## 2016-01-20 NOTE — Telephone Encounter (Signed)
Added MD apt per pof, pt aware

## 2016-01-20 NOTE — Telephone Encounter (Signed)
-----   Message from Heath Lark, MD sent at 01/20/2016  2:02 PM EDT ----- Regarding: results Pls let her know MRI showed mild compression fractures I do not think there is anything surgical to do  I can review the results with her when she comes in for Rx on 7/20 If OK with her, please get scheduler to add appt at 1145 am (see in infusion room) to review MRI ----- Message -----    From: Rad Results In Interface    Sent: 01/20/2016  11:56 AM      To: Heath Lark, MD

## 2016-01-20 NOTE — Telephone Encounter (Signed)
Pt notified of message below. Is OK to see her infusion room on 01/23/16

## 2016-01-20 NOTE — Telephone Encounter (Signed)
pls send poF

## 2016-01-21 ENCOUNTER — Encounter: Payer: Self-pay | Admitting: *Deleted

## 2016-01-23 ENCOUNTER — Ambulatory Visit (HOSPITAL_BASED_OUTPATIENT_CLINIC_OR_DEPARTMENT_OTHER): Payer: Medicare Other

## 2016-01-23 ENCOUNTER — Ambulatory Visit: Payer: Medicare Other | Admitting: Nutrition

## 2016-01-23 ENCOUNTER — Ambulatory Visit (HOSPITAL_BASED_OUTPATIENT_CLINIC_OR_DEPARTMENT_OTHER): Payer: Medicare Other | Admitting: Hematology and Oncology

## 2016-01-23 ENCOUNTER — Encounter: Payer: Self-pay | Admitting: Hematology and Oncology

## 2016-01-23 ENCOUNTER — Other Ambulatory Visit (HOSPITAL_BASED_OUTPATIENT_CLINIC_OR_DEPARTMENT_OTHER): Payer: Medicare Other

## 2016-01-23 ENCOUNTER — Telehealth: Payer: Self-pay | Admitting: *Deleted

## 2016-01-23 ENCOUNTER — Other Ambulatory Visit: Payer: Self-pay | Admitting: Hematology and Oncology

## 2016-01-23 ENCOUNTER — Ambulatory Visit: Payer: Medicare Other

## 2016-01-23 VITALS — BP 148/56 | HR 80 | Temp 98.3°F | Resp 18

## 2016-01-23 VITALS — BP 155/70 | HR 74 | Temp 97.7°F | Resp 17

## 2016-01-23 DIAGNOSIS — M549 Dorsalgia, unspecified: Secondary | ICD-10-CM

## 2016-01-23 DIAGNOSIS — C9001 Multiple myeloma in remission: Secondary | ICD-10-CM

## 2016-01-23 DIAGNOSIS — C9002 Multiple myeloma in relapse: Secondary | ICD-10-CM

## 2016-01-23 DIAGNOSIS — Z5112 Encounter for antineoplastic immunotherapy: Secondary | ICD-10-CM | POA: Diagnosis present

## 2016-01-23 DIAGNOSIS — T402X5A Adverse effect of other opioids, initial encounter: Secondary | ICD-10-CM

## 2016-01-23 DIAGNOSIS — M4850XG Collapsed vertebra, not elsewhere classified, site unspecified, subsequent encounter for fracture with delayed healing: Secondary | ICD-10-CM | POA: Diagnosis not present

## 2016-01-23 DIAGNOSIS — D61810 Antineoplastic chemotherapy induced pancytopenia: Secondary | ICD-10-CM | POA: Diagnosis not present

## 2016-01-23 DIAGNOSIS — R1310 Dysphagia, unspecified: Secondary | ICD-10-CM

## 2016-01-23 DIAGNOSIS — K5903 Drug induced constipation: Secondary | ICD-10-CM

## 2016-01-23 DIAGNOSIS — G620 Drug-induced polyneuropathy: Secondary | ICD-10-CM | POA: Diagnosis not present

## 2016-01-23 DIAGNOSIS — G8929 Other chronic pain: Secondary | ICD-10-CM | POA: Diagnosis not present

## 2016-01-23 DIAGNOSIS — T451X5A Adverse effect of antineoplastic and immunosuppressive drugs, initial encounter: Secondary | ICD-10-CM

## 2016-01-23 DIAGNOSIS — E440 Moderate protein-calorie malnutrition: Secondary | ICD-10-CM | POA: Diagnosis not present

## 2016-01-23 DIAGNOSIS — IMO0001 Reserved for inherently not codable concepts without codable children: Secondary | ICD-10-CM

## 2016-01-23 HISTORY — DX: Dysphagia, unspecified: R13.10

## 2016-01-23 LAB — CBC WITH DIFFERENTIAL/PLATELET
BASO%: 0 % (ref 0.0–2.0)
BASOS ABS: 0 10*3/uL (ref 0.0–0.1)
EOS%: 0 % (ref 0.0–7.0)
Eosinophils Absolute: 0 10*3/uL (ref 0.0–0.5)
HEMATOCRIT: 26.9 % — AB (ref 34.8–46.6)
HEMOGLOBIN: 9.2 g/dL — AB (ref 11.6–15.9)
LYMPH#: 0.7 10*3/uL — AB (ref 0.9–3.3)
LYMPH%: 20.8 % (ref 14.0–49.7)
MCH: 34.6 pg — ABNORMAL HIGH (ref 25.1–34.0)
MCHC: 34.2 g/dL (ref 31.5–36.0)
MCV: 101.1 fL — ABNORMAL HIGH (ref 79.5–101.0)
MONO#: 0.1 10*3/uL (ref 0.1–0.9)
MONO%: 2.2 % (ref 0.0–14.0)
NEUT#: 2.4 10*3/uL (ref 1.5–6.5)
NEUT%: 77 % — AB (ref 38.4–76.8)
PLATELETS: 159 10*3/uL (ref 145–400)
RBC: 2.66 10*6/uL — ABNORMAL LOW (ref 3.70–5.45)
RDW: 16.2 % — AB (ref 11.2–14.5)
WBC: 3.2 10*3/uL — ABNORMAL LOW (ref 3.9–10.3)

## 2016-01-23 LAB — COMPREHENSIVE METABOLIC PANEL
ALBUMIN: 3.2 g/dL — AB (ref 3.5–5.0)
ALK PHOS: 94 U/L (ref 40–150)
ALT: 27 U/L (ref 0–55)
AST: 40 U/L — AB (ref 5–34)
Anion Gap: 7 mEq/L (ref 3–11)
BILIRUBIN TOTAL: 0.42 mg/dL (ref 0.20–1.20)
BUN: 18.7 mg/dL (ref 7.0–26.0)
CO2: 25 meq/L (ref 22–29)
Calcium: 8.6 mg/dL (ref 8.4–10.4)
Chloride: 103 mEq/L (ref 98–109)
Creatinine: 0.8 mg/dL (ref 0.6–1.1)
EGFR: 64 mL/min/{1.73_m2} — AB (ref >90)
GLUCOSE: 99 mg/dL (ref 70–140)
POTASSIUM: 4.2 meq/L (ref 3.5–5.1)
SODIUM: 135 meq/L — AB (ref 136–145)
TOTAL PROTEIN: 8.5 g/dL — AB (ref 6.4–8.3)

## 2016-01-23 MED ORDER — PROCHLORPERAZINE MALEATE 10 MG PO TABS
10.0000 mg | ORAL_TABLET | Freq: Once | ORAL | Status: AC
  Administered 2016-01-23: 10 mg via ORAL

## 2016-01-23 MED ORDER — DIPHENHYDRAMINE HCL 25 MG PO CAPS
50.0000 mg | ORAL_CAPSULE | Freq: Once | ORAL | Status: AC
  Administered 2016-01-23: 50 mg via ORAL

## 2016-01-23 MED ORDER — PROCHLORPERAZINE MALEATE 10 MG PO TABS
ORAL_TABLET | ORAL | Status: AC
  Filled 2016-01-23: qty 1

## 2016-01-23 MED ORDER — SODIUM CHLORIDE 0.9 % IV SOLN
Freq: Once | INTRAVENOUS | Status: AC
  Administered 2016-01-23: 09:00:00 via INTRAVENOUS

## 2016-01-23 MED ORDER — SODIUM CHLORIDE 0.9 % IJ SOLN
10.0000 mL | INTRAMUSCULAR | Status: DC | PRN
  Administered 2016-01-23: 10 mL
  Filled 2016-01-23: qty 10

## 2016-01-23 MED ORDER — METHYLPREDNISOLONE SODIUM SUCC 125 MG IJ SOLR
INTRAMUSCULAR | Status: AC
  Filled 2016-01-23: qty 2

## 2016-01-23 MED ORDER — ACETAMINOPHEN 325 MG PO TABS
ORAL_TABLET | ORAL | Status: AC
  Filled 2016-01-23: qty 2

## 2016-01-23 MED ORDER — METHYLPREDNISOLONE SODIUM SUCC 125 MG IJ SOLR
125.0000 mg | Freq: Once | INTRAMUSCULAR | Status: AC
  Administered 2016-01-23: 125 mg via INTRAVENOUS

## 2016-01-23 MED ORDER — SODIUM CHLORIDE 0.9% FLUSH
10.0000 mL | INTRAVENOUS | Status: DC | PRN
  Administered 2016-01-23: 10 mL
  Filled 2016-01-23: qty 10

## 2016-01-23 MED ORDER — SODIUM CHLORIDE 0.9 % IV SOLN
15.4000 mg/kg | Freq: Once | INTRAVENOUS | Status: AC
  Administered 2016-01-23: 800 mg via INTRAVENOUS
  Filled 2016-01-23: qty 40

## 2016-01-23 MED ORDER — ACETAMINOPHEN 325 MG PO TABS
650.0000 mg | ORAL_TABLET | Freq: Once | ORAL | Status: AC
  Administered 2016-01-23: 650 mg via ORAL

## 2016-01-23 MED ORDER — HEPARIN SOD (PORK) LOCK FLUSH 100 UNIT/ML IV SOLN
500.0000 [IU] | Freq: Once | INTRAVENOUS | Status: AC | PRN
  Administered 2016-01-23: 500 [IU]
  Filled 2016-01-23: qty 5

## 2016-01-23 MED ORDER — BORTEZOMIB CHEMO SQ INJECTION 3.5 MG (2.5MG/ML)
0.9750 mg/m2 | Freq: Once | INTRAMUSCULAR | Status: AC
  Administered 2016-01-23: 1.5 mg via SUBCUTANEOUS
  Filled 2016-01-23: qty 1.5

## 2016-01-23 MED ORDER — DIPHENHYDRAMINE HCL 50 MG/ML IJ SOLN
INTRAMUSCULAR | Status: AC
  Filled 2016-01-23: qty 1

## 2016-01-23 MED ORDER — PROCHLORPERAZINE MALEATE 10 MG PO TABS
10.0000 mg | ORAL_TABLET | Freq: Once | ORAL | Status: DC
Start: 2016-01-23 — End: 2016-01-23

## 2016-01-23 MED ORDER — DIPHENHYDRAMINE HCL 25 MG PO CAPS
ORAL_CAPSULE | ORAL | Status: AC
  Filled 2016-01-23: qty 2

## 2016-01-23 NOTE — Progress Notes (Signed)
[place] OFFICE PROGRESS NOTE  Patient Care Team: Thressa Sheller, MD as PCP - General (Internal Medicine) Thressa Sheller, MD (Internal Medicine) Renella Cunas, MD as Attending Physician (Cardiology) Heath Lark, MD as Consulting Physician (Hematology and Oncology) Luanne Bras, MD as Consulting Physician (Interventional Radiology)  SUMMARY OF ONCOLOGIC HISTORY: Oncology History   The     Multiple myeloma in relapse [facility])   11/12/2008 Imaging Skeletal survey showed lytic lesion in the right femur compatible with  myeloma. There were Questionable skull lesions   11/15/2008 Bone Marrow Biopsy BONE MARROW ASPIRATE AND BIOPSY: showed NORMOCELLULAR MARROW FOR AGE WITH PLASMA CELL DYSCRASIA  (PLASMA CELLS 25%). 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 Subsequently she was placed on Revlimid & Dexamethasone alone   05/12/2013 Bone Marrow Biopsy Bone marrow biopsy showed persistent plasma cells. Blood work confirmed VGPR status   06/11/2014 Tumor Marker Bloodwork show disease relapse   06/26/2014 Procedure She has placement of port   07/05/2014 - 07/26/2014 Chemotherapy She received Elotuzumab and Revlimid. Rx was discontinued with elotuzumab per patient preference   08/01/2014 - 08/05/2014 Hospital Admission She was admitted to the hospital for kyphoplasty and pain management after traumatic compression fracture   08/23/2014 - 04/18/2015 Chemotherapy She was placed on dexamethasone & Revlimid   04/11/2015 Tumor Marker Blood work showed VGPR. Treatment was discontinued and she is maintained on Zometa only   10/20/2015 Imaging MRI spine showed acute/subacute superior endplate compression fractures at T2 and T9 are incompletely healed.    11/05/2015 Procedure Status post vertebral body augmentation using balloon kyphoplasty at T9 as  described without event   12/27/2015 Procedure She had port placement   01/02/2016 -  Chemotherapy She received Daratumumab, Dexamethasone and Velcade   01/20/2016 Imaging MRi spine showed acute to subacute pathologic fractures of T10 and T11. Up to 70% loss of vertebral body height at the former with mild retropulsion of bone contributing to mild spinal stenosis at the T10-T11 level.    INTERVAL HISTORY: Please see below for problem oriented charting. She is seen at the infusion area. She is receiving her fourth week of treatment. She complained of significant pain when she tries to move. Her pain appears to be under controlled when she take her pain medicine. She has chronic constipation. She had poor appetite but denies nausea. She has sensation of dysphagia She denies neurological deficit. She is not compliant using her thoracic brace and is not very active. Denies recent infection. Denies worsening neuropathy. The patient denies any recent signs or symptoms of bleeding such as spontaneous epistaxis, hematuria or hematochezia.   REVIEW OF SYSTEMS:   Constitutional: Denies fevers, chills  Eyes: Denies blurriness of vision Ears, nose, mouth, throat, and face: Denies mucositis or sore throat Respiratory: Denies cough, dyspnea or wheezes Cardiovascular: Denies palpitation, chest discomfort or lower extremity swelling Skin: Denies abnormal skin rashes Lymphatics: Denies new lymphadenopathy or easy bruising Behavioral/Psych: Mood is stable, no new changes  All other systems were reviewed with the patient and are negative.  I have reviewed the past medical history, past surgical history, social history and family history with the patient and they are unchanged from previous note.  ALLERGIES:  has No Known Allergies.  MEDICATIONS:  Current Outpatient Prescriptions  Medication Sig Dispense Refill  . acyclovir (ZOVIRAX) 400 MG tablet Take 1 tablet (400 mg total) by mouth daily. 60 tablet  11  . aspirin 325 MG EC tablet Take 325 mg by mouth daily.    . cholecalciferol (VITAMIN D) 1000 UNITS tablet Take 1,000 Units by mouth daily.    . cyclobenzaprine (FLEXERIL) 5 MG tablet Take 1 tablet (5 mg total) by mouth 3 (three) times daily as needed for muscle spasms. 30 tablet 0  . dexamethasone (DECADRON) 4 MG tablet 20 mg weekly on Thursday 20 tablet 4  . diphenhydramine-acetaminophen (TYLENOL PM) 25-500 MG TABS Take 1 tablet by mouth at bedtime as needed (Sleep). Reported on 01/09/2016    . gabapentin (NEURONTIN) 300 MG capsule TAKE ONE CAPSULE BY MOUTH THREE TIMES DAILY 90 capsule 3  . levothyroxine (SYNTHROID, LEVOTHROID) 50 MCG tablet Take 50 mcg by mouth daily before breakfast.    . loperamide (IMODIUM A-D) 2 MG tablet Take 2 mg by mouth as needed for diarrhea or loose stools (as directed.). Reported on 01/09/2016    . mirtazapine (REMERON) 7.5 MG tablet TAKE ONE TABLET BY MOUTH NIGHTLY AT BEDTIME 30 tablet 3  . Multiple Vitamin (MULTIVITAMIN WITH MINERALS) TABS tablet Take 1 tablet by mouth daily.    [name] omeprazole (PRILOSEC) 20 MG capsule Take 1 capsule (20 mg total) by mouth daily. 90 capsule 1  . ondansetron (ZOFRAN) 8 MG tablet Take 1 tablet (8 mg total) by mouth every 8 (eight) hours as needed for nausea or vomiting. (Patient not taking: Reported on 01/09/2016) 60 tablet 3  . oxyCODONE (ROXICODONE) 15 MG immediate release tablet Take 1 tablet (15 mg total) by mouth every 4 (four) hours as needed for severe pain. 60 tablet 0  . oxyCODONE-acetaminophen (PERCOCET/ROXICET) 5-325 MG tablet Take 2 tablets by mouth every 6 (six) hours as needed for severe pain. 60 tablet 0  . Polyvinyl Alcohol-Povidone (REFRESH OP) Place 2 drops into both eyes 2 (two) times daily as needed (dry eyes).    . prochlorperazine (COMPAZINE) 10 MG tablet Take 1 tablet (10 mg total) by mouth every 6 (six) hours as needed for nausea or vomiting. (Patient not taking: Reported on 01/09/2016) 60 tablet 3  . traMADol (ULTRAM)  50 MG tablet Take 2 tablets (100 mg total) by mouth every 6 (six) hours as needed for moderate pain (one to two tablets every 6 hrs as needed.). 90 tablet 0  . valsartan (DIOVAN) 160 MG tablet Take 1 tablet (160 mg total) by mouth daily. 30 tablet 3   No current facility-administered medications for this visit.   Facility-Administered Medications Ordered in Other Visits  Medication Dose Route Frequency Provider Last Rate Last Dose  . heparin lock flush 100 unit/mL  500 Units Intracatheter Once PRN Heath Lark, MD      . sodium chloride flush (NS) 0.9 % injection 10 mL  10 mL Intracatheter PRN Heath Lark, MD        PHYSICAL EXAMINATION: ECOG PERFORMANCE STATUS: 2 - Symptomatic, <50% confined to bed  Filed Vitals:   01/23/16 0908  BP: 155/70  Pulse: 74  Temp: 97.7 F (36.5 C)  Resp: 17   There were no vitals filed for this visit.  GENERAL:alert, no distress and comfortable. She looks anxious and mildly depressed SKIN: skin color, texture, turgor are normal, no rashes or significant lesions EYES: normal, Conjunctiva are pink and non-injected, sclera clear Musculoskeletal:no cyanosis of digits and no clubbing  NEURO: alert & oriented x 3 with fluent speech, no focal motor/sensory deficits  LABORATORY DATA:  I have reviewed the data as listed    Component Value  Date/Time   NA 135* 01/23/2016 0823   NA 136 11/05/2015 0700   K 4.2 01/23/2016 0823   K 3.9 11/05/2015 0700   CL 100* 11/05/2015 0700   CL 103 11/25/2012 1328   CO2 25 01/23/2016 0823   CO2 27 11/05/2015 0700   GLUCOSE 99 01/23/2016 0823   GLUCOSE 78 11/05/2015 0700   GLUCOSE 91 11/25/2012 1328   BUN 18.7 01/23/2016 0823   BUN 19 11/05/2015 0700   CREATININE 0.8 01/23/2016 0823   CREATININE 0.88 11/05/2015 0700   CALCIUM 8.6 01/23/2016 0823   CALCIUM 9.6 11/05/2015 0700   PROT 8.5* 01/23/2016 0823   PROT 7.9 12/16/2015 1115   PROT 5.7* 09/21/2014 0725   ALBUMIN 3.2* 01/23/2016 0823   ALBUMIN 3.0* 09/21/2014  0725   AST 40* 01/23/2016 0823   AST 22 09/21/2014 0725   ALT 27 01/23/2016 0823   ALT 17 09/21/2014 0725   ALKPHOS 94 01/23/2016 0823   ALKPHOS 43 09/21/2014 0725   BILITOT 0.42 01/23/2016 0823   BILITOT 0.7 09/21/2014 0725   GFRNONAA 57* 11/05/2015 0700   GFRAA >60 11/05/2015 0700    No results found for: SPEP, UPEP  Lab Results  Component Value Date   WBC 3.2* 01/23/2016   NEUTROABS 2.4 01/23/2016   HGB 9.2* 01/23/2016   HCT 26.9* 01/23/2016   MCV 101.1* 01/23/2016   PLT 159 01/23/2016      Chemistry      Component Value Date/Time   NA 135* 01/23/2016 0823   NA 136 11/05/2015 0700   K 4.2 01/23/2016 0823   K 3.9 11/05/2015 0700   CL 100* 11/05/2015 0700   CL 103 11/25/2012 1328   CO2 25 01/23/2016 0823   CO2 27 11/05/2015 0700   BUN 18.7 01/23/2016 0823   BUN 19 11/05/2015 0700   CREATININE 0.8 01/23/2016 0823   CREATININE 0.88 11/05/2015 0700      Component Value Date/Time   CALCIUM 8.6 01/23/2016 0823   CALCIUM 9.6 11/05/2015 0700   ALKPHOS 94 01/23/2016 0823   ALKPHOS 43 09/21/2014 0725   AST 40* 01/23/2016 0823   AST 22 09/21/2014 0725   ALT 27 01/23/2016 0823   ALT 17 09/21/2014 0725   BILITOT 0.42 01/23/2016 0823   BILITOT 0.7 09/21/2014 0725       RADIOGRAPHIC STUDIES:I reviewed recent MRI spine with her and her daughter I have personally reviewed the radiological images as listed and agreed with the findings in the report.    ASSESSMENT & PLAN:  Multiple myeloma in relapse [facility]) I reviewed the MRI with the patient and her daughter. I also reviewed her prior blood test results with her. She likely has an acute thoracic compression fracture due to recent streneous activity. The myeloma treatment has recently been switched. She will have recheck of her myeloma panel next week. I will see her back in 2 weeks to review test results. In the meantime, I will continue to provide supportive care as outlined below  Vertebral compression  fracture She has another new compression fracture noted on MRI. I will get my nurse to call interventional radiologist to review the MRI and see if another kyphoplasty would be appropriate. Previously, the patient has been noncompliant with recommendation of avoiding excessively lifting and to wear her thoracic brace at all times.  In the meantime, she will continue pain medicine as needed and I reinforced the importance of wearing a thoracic brace.  Chronic back pain greater than 3 months  duration She has acute on chronic pain and appears to be very debilitated. Previously, she has been noncompliant with pain medication recommendation. I reinforced the importance of taking her pain medicine correctly and as needed. I reinforced again the importance of her wearing her thoracic brace. In the short-term, I will not refer her to physical therapy yet as there are many other unresolved issues including poorly controlled pain  Constipation due to opioid therapy She has chronic constipation and is not consistent taking a laxative. I reinforced the importance of taking her laxatives on the regular basis  Dysphagia The patient is quite vague about her symptom of dysphagia. She felt that she has sensation of food being stuck in her throat when she swallows. I recommend referral to speech and language therapist and possibly modify barium swallow for further assessment as I do not want dysphagia as a reason why she is losing weight  Pancytopenia due to antineoplastic chemotherapy [facility]) She is not symptomatic. I will continue dose reduced Velcade She does not need transfusion    Neuropathy due to chemotherapeutic drug She had history of neuropathy. I will prescribe reduced dose Velcade So far she denies worsening neuropathy    Protein-calorie malnutrition, moderate ([place]) She has poor appetite, weight loss and evidence of protein calorie malnutrition. She is referred to see a dietitian  today. She also has significant history of depression that would also contributed due to anorexia. She is on Remeron I encouraged her to eat frequent small meals.   No orders of the defined types were placed in this encounter.   All questions were answered. The patient knows to call the clinic with any problems, questions or concerns. No barriers to learning was detected. I spent 30 minutes counseling the patient face to face. The total time spent in the appointment was 40 minutes and more than 50% was on counseling and review of test results     [facility], [place], MD 01/23/2016 2:27 PM

## 2016-01-23 NOTE — Telephone Encounter (Signed)
S/w Caryl Pina at Dr. Arma Heading office.  Notified of new pathological fracture in spine and pain.   She will print out recent MRI and leave it on Dr. Arma Heading  desk for his review.   She says their office will contact pt for appt if he feels they can intervene in any way.   Asked her to call us back and let us know what he says.

## 2016-01-23 NOTE — Assessment & Plan Note (Signed)
She has acute on chronic pain and appears to be very debilitated. Previously, she has been noncompliant with pain medication recommendation. I reinforced the importance of taking her pain medicine correctly and as needed. I reinforced again the importance of her wearing her thoracic brace. In the short-term, I will not refer her to physical therapy yet as there are many other unresolved issues including poorly controlled pain

## 2016-01-23 NOTE — Assessment & Plan Note (Signed)
She has another new compression fracture noted on MRI. I will get my nurse to call interventional radiologist to review the MRI and see if another kyphoplasty would be appropriate. Previously, the patient has been noncompliant with recommendation of avoiding excessively lifting and to wear her thoracic brace at all times.  In the meantime, she will continue pain medicine as needed and I reinforced the importance of wearing a thoracic brace.

## 2016-01-23 NOTE — Assessment & Plan Note (Signed)
I reviewed the MRI with the patient and her daughter. I also reviewed her prior blood test results with her. She likely has an acute thoracic compression fracture due to recent streneous activity. The myeloma treatment has recently been switched. She will have recheck of her myeloma panel next week. I will see her back in 2 weeks to review test results. In the meantime, I will continue to provide supportive care as outlined below

## 2016-01-23 NOTE — Assessment & Plan Note (Signed)
She had history of neuropathy. I will prescribe reduced dose Velcade So far she denies worsening neuropathy

## 2016-01-23 NOTE — Patient Instructions (Signed)

## 2016-01-23 NOTE — Patient Instructions (Signed)
[facility] Discharge Instructions for Patients Receiving Chemotherapy  Today you received the following chemotherapy agents: Velcade, Daratumumab.   To help prevent nausea and vomiting after your treatment, we encourage you to take your nausea medication: as directed.    If you develop nausea and vomiting that is not controlled by your nausea medication, call the clinic.   BELOW ARE SYMPTOMS THAT SHOULD BE REPORTED IMMEDIATELY:  *FEVER GREATER THAN 100.5 F  *CHILLS WITH OR WITHOUT FEVER  NAUSEA AND VOMITING THAT IS NOT CONTROLLED WITH YOUR NAUSEA MEDICATION  *UNUSUAL SHORTNESS OF BREATH  *UNUSUAL BRUISING OR BLEEDING  TENDERNESS IN MOUTH AND THROAT WITH OR WITHOUT PRESENCE OF ULCERS  *URINARY PROBLEMS  *BOWEL PROBLEMS  UNUSUAL RASH Items with * indicate a potential emergency and should be followed up as soon as possible.  Feel free to call the clinic you have any questions or concerns. The clinic phone number is [phone].  Please show the CHEMO ALERT CARD at check-in to the Emergency Department and triage nurse.   

## 2016-01-23 NOTE — Assessment & Plan Note (Signed)
She has chronic constipation and is not consistent taking a laxative. I reinforced the importance of taking her laxatives on the regular basis

## 2016-01-23 NOTE — Assessment & Plan Note (Signed)
She has poor appetite, weight loss and evidence of protein calorie malnutrition. She is referred to see a dietitian today. She also has significant history of depression that would also contributed due to anorexia. She is on Remeron I encouraged her to eat frequent small meals.

## 2016-01-23 NOTE — Assessment & Plan Note (Signed)
The patient is quite vague about her symptom of dysphagia. She felt that she has sensation of food being stuck in her throat when she swallows. I recommend referral to speech and language therapist and possibly modify barium swallow for further assessment as I do not want dysphagia as a reason why she is losing weight

## 2016-01-23 NOTE — Progress Notes (Signed)
82 year old female diagnosed with multiple myeloma and who is a patient of Dr. Alvy Bimler.  Past medical history includes hypertension, DCIS, breast cancer, depression, CAD, and hypothyroidism.  Medications include vitamin D, Decadron, Synthroid, Imodium, Remeron, multivitamin, Prilosec, Zofran, and Compazine.  Labs include albumin 3.2, sodium 135.  Height: 64 inches. Weight: 114.1 pounds July 6. Usual body weight: 130 pounds 18 months ago. BMI: 19.5.  Patient complains of difficulty swallowing, early satiety, and increased gas. Patient endorses 15 pound weight loss. Reports she has always tried to eat a healthy diet.  Nutrition diagnosis: Unintended weight loss related to difficulty swallowing and early satiety as evidenced by 12% weight loss from usual body weight.  Intervention: Patient was educated to consume small frequent meals and snacks throughout the day focusing on high-calorie, high-protein foods. Educated patient on strategies for chewing food well and eating smaller amounts at a time. Encouraged soft, moist protein foods and provided fact sheets on increasing calories and protein. Encouraged patient to prepare smoothies or milkshakes as desired. Encouraged her to try oral nutrition supplements and provided samples. Questions were answered.  Teach back method used.  Contact information given.  Monitoring, evaluation, goals: Patient will tolerate adequate calories and protein to minimize further weight loss.  Next visit: Patient will contact me for questions or concerns.  **Disclaimer: This note was dictated with voice recognition software. Similar sounding words can inadvertently be transcribed and this note may contain transcription errors which may not have been corrected upon publication of note.**

## 2016-01-23 NOTE — Assessment & Plan Note (Signed)
She is not symptomatic. I will continue dose reduced Velcade She does not need transfusion

## 2016-01-23 NOTE — Progress Notes (Signed)
Patient complaining of "no appetite" and reports being discouraged by her lack of appetite. Message sent to Ernestene Kiel RD.

## 2016-01-25 ENCOUNTER — Ambulatory Visit: Payer: Medicare Other

## 2016-01-27 ENCOUNTER — Telehealth: Payer: Self-pay | Admitting: *Deleted

## 2016-01-27 DIAGNOSIS — M129 Arthropathy, unspecified: Secondary | ICD-10-CM

## 2016-01-27 DIAGNOSIS — IMO0001 Reserved for inherently not codable concepts without codable children: Secondary | ICD-10-CM

## 2016-01-27 DIAGNOSIS — M4850XS Collapsed vertebra, not elsewhere classified, site unspecified, sequela of fracture: Secondary | ICD-10-CM

## 2016-01-27 DIAGNOSIS — C9000 Multiple myeloma not having achieved remission: Secondary | ICD-10-CM

## 2016-01-27 MED ORDER — TRAMADOL HCL 50 MG PO TABS: 100.0000 mg | ORAL_TABLET | Freq: Four times a day (QID) | ORAL | 0 refills | Status: DC | PRN

## 2016-01-27 NOTE — Telephone Encounter (Signed)
Dr. Alvy Bimler ok for nurse to call in refill Tramadol to pt's pharmacy.   Pt's daughter left VM at 11:45 am saying they are at [facility] and they do not have the Rx yet.  I called in the Rx to pharmacy and called daughter to notify it is done.  Informed daughter that I instructed pt to not take tramadol and oxycodone together.  Daughter says oxycodone alone does not work.  She put pt on the phone.   I clarified oxycodone dose 15 mg should be stronger than the tramadol for pain, but pt says she does not have the oxy 15 mg any more.  Her last Rx for oxycodone is percocet 5/325 mg.    Pt says if she can get Rx for 15 mg Oxycodone again she will be willing to take only the oxycodone and get off of the tramadol altogether.  She will take the tramadol for now and pick up Rx for Oxycodone when she comes in for treatment this Thursday.  Asked pt if she wants to come pick up Rx sooner and she says she will wait until Thursday.   S/w Dr. Alvy Bimler regarding above conversation and she says she will give Rx for oxycodone 15 mg on Thursday when pt comes in.

## 2016-01-27 NOTE — Telephone Encounter (Signed)
Pt called requesting refill on Tramadol.  She reports taking 2 tramadol and 1 oxycodone at the same time three times a day.  Discussed w/ pt the idea of having 2 different pain meds was to take Tramadol for less pain and Oxycodone for more severe pain.   It is not advised to take them at the same time.  Suggested pt try to stick to just the oxycodone for pain relief.  She can take one every 4 hrs as needed.   If it is not strong enough,  Let us know, but it is a lot safer to just take one narcotic pain medication at a time.   Pt verbalized agreement.  She says she will try to take just the oxycodone alone and let us know how that works.  She still wants refill on Tramadol and agreed to only take it by itself for times when she feels pain is not as bad.   She also c/o a lot of "gas" constantly and admits this has been a problem most of her life even before she got sick.  Pharmacist suggested to her to take one stool softener every time she takes pain medication.  Informed pt sounds like a good suggestion.  Let us know how it works.

## 2016-01-28 ENCOUNTER — Telehealth: Payer: Self-pay | Admitting: *Deleted

## 2016-01-28 MED ORDER — OXYCODONE HCL 15 MG PO TABS: 15.0000 mg | ORAL_TABLET | ORAL | 0 refills | Status: DC | PRN

## 2016-01-28 NOTE — Telephone Encounter (Signed)
Pt left VM this morning states percocet not helping her pain.  She requests Rx for Oxycodone 15 mg as discussed yesterday.   Informed pt of Rx ready to pick up.  Instructed she may take one tablet every 4 hrs as needed for pain.  If that does not help she can take tramadol, but do not take at the same time.  And let us know if the oxycodone alone is not helping her pain, so Dr. Alvy Bimler can adjust her dose if needed.  Pt verbalized understanding.

## 2016-01-30 ENCOUNTER — Ambulatory Visit: Payer: Medicare Other

## 2016-01-30 ENCOUNTER — Other Ambulatory Visit (HOSPITAL_BASED_OUTPATIENT_CLINIC_OR_DEPARTMENT_OTHER): Payer: Medicare Other

## 2016-01-30 ENCOUNTER — Ambulatory Visit (HOSPITAL_BASED_OUTPATIENT_CLINIC_OR_DEPARTMENT_OTHER): Payer: Medicare Other

## 2016-01-30 VITALS — BP 110/46 | HR 87 | Temp 98.0°F | Resp 18

## 2016-01-30 DIAGNOSIS — Z5112 Encounter for antineoplastic immunotherapy: Secondary | ICD-10-CM | POA: Diagnosis present

## 2016-01-30 DIAGNOSIS — C9001 Multiple myeloma in remission: Secondary | ICD-10-CM

## 2016-01-30 DIAGNOSIS — C9002 Multiple myeloma in relapse: Secondary | ICD-10-CM

## 2016-01-30 DIAGNOSIS — C9000 Multiple myeloma not having achieved remission: Secondary | ICD-10-CM

## 2016-01-30 LAB — COMPREHENSIVE METABOLIC PANEL WITH GFR
ALT: 20 U/L (ref 0–55)
AST: 23 U/L (ref 5–34)
Albumin: 3.2 g/dL — ABNORMAL LOW (ref 3.5–5.0)
Alkaline Phosphatase: 86 U/L (ref 40–150)
Anion Gap: 10 meq/L (ref 3–11)
BUN: 17.4 mg/dL (ref 7.0–26.0)
CO2: 24 meq/L (ref 22–29)
Calcium: 8.8 mg/dL (ref 8.4–10.4)
Chloride: 99 meq/L (ref 98–109)
Creatinine: 0.8 mg/dL (ref 0.6–1.1)
EGFR: 69 ml/min/1.73 m2 — ABNORMAL LOW
Glucose: 100 mg/dL (ref 70–140)
Potassium: 4.3 meq/L (ref 3.5–5.1)
Sodium: 133 meq/L — ABNORMAL LOW (ref 136–145)
Total Bilirubin: 0.39 mg/dL (ref 0.20–1.20)
Total Protein: 8.2 g/dL (ref 6.4–8.3)

## 2016-01-30 LAB — CBC WITH DIFFERENTIAL/PLATELET
BASO%: 0 % (ref 0.0–2.0)
Basophils Absolute: 0 10e3/uL (ref 0.0–0.1)
EOS%: 0.3 % (ref 0.0–7.0)
Eosinophils Absolute: 0 10e3/uL (ref 0.0–0.5)
HCT: 26.3 % — ABNORMAL LOW (ref 34.8–46.6)
HGB: 9 g/dL — ABNORMAL LOW (ref 11.6–15.9)
LYMPH%: 23.5 % (ref 14.0–49.7)
MCH: 34.9 pg — ABNORMAL HIGH (ref 25.1–34.0)
MCHC: 34.2 g/dL (ref 31.5–36.0)
MCV: 101.9 fL — ABNORMAL HIGH (ref 79.5–101.0)
MONO#: 0 10e3/uL — ABNORMAL LOW (ref 0.1–0.9)
MONO%: 1.3 % (ref 0.0–14.0)
NEUT#: 2.2 10e3/uL (ref 1.5–6.5)
NEUT%: 74.9 % (ref 38.4–76.8)
Platelets: 134 10e3/uL — ABNORMAL LOW (ref 145–400)
RBC: 2.58 10e6/uL — ABNORMAL LOW (ref 3.70–5.45)
RDW: 16.5 % — ABNORMAL HIGH (ref 11.2–14.5)
WBC: 3 10e3/uL — ABNORMAL LOW (ref 3.9–10.3)
lymph#: 0.7 10e3/uL — ABNORMAL LOW (ref 0.9–3.3)

## 2016-01-30 MED ORDER — ACETAMINOPHEN 325 MG PO TABS
ORAL_TABLET | ORAL | Status: AC
  Filled 2016-01-30: qty 2

## 2016-01-30 MED ORDER — SODIUM CHLORIDE 0.9 % IJ SOLN
10.0000 mL | INTRAMUSCULAR | Status: AC | PRN
Start: 2016-01-30 — End: 2016-01-30
  Administered 2016-01-30: 10 mL
  Filled 2016-01-30: qty 10

## 2016-01-30 MED ORDER — DARATUMUMAB CHEMO INJECTION 400 MG/20ML
15.4000 mg/kg | Freq: Once | INTRAVENOUS | Status: AC
  Administered 2016-01-30: 800 mg via INTRAVENOUS
  Filled 2016-01-30: qty 40

## 2016-01-30 MED ORDER — METHYLPREDNISOLONE SODIUM SUCC 125 MG IJ SOLR
125.0000 mg | Freq: Once | INTRAMUSCULAR | Status: AC
  Administered 2016-01-30: 125 mg via INTRAVENOUS

## 2016-01-30 MED ORDER — ACETAMINOPHEN 325 MG PO TABS
650.0000 mg | ORAL_TABLET | Freq: Once | ORAL | Status: AC
  Administered 2016-01-30: 650 mg via ORAL

## 2016-01-30 MED ORDER — DIPHENHYDRAMINE HCL 25 MG PO CAPS
ORAL_CAPSULE | ORAL | Status: AC
  Filled 2016-01-30: qty 2

## 2016-01-30 MED ORDER — SODIUM CHLORIDE 0.9% FLUSH
10.0000 mL | INTRAVENOUS | Status: DC | PRN
Start: 2016-01-30 — End: 2016-01-30
  Administered 2016-01-30: 10 mL
  Filled 2016-01-30: qty 10

## 2016-01-30 MED ORDER — METHYLPREDNISOLONE SODIUM SUCC 125 MG IJ SOLR
INTRAMUSCULAR | Status: AC
  Filled 2016-01-30: qty 2

## 2016-01-30 MED ORDER — HEPARIN SOD (PORK) LOCK FLUSH 100 UNIT/ML IV SOLN
500.0000 [IU] | Freq: Once | INTRAVENOUS | Status: AC | PRN
  Administered 2016-01-30: 500 [IU]
  Filled 2016-01-30: qty 5

## 2016-01-30 MED ORDER — HYDROMORPHONE HCL 4 MG/ML IJ SOLN
1.0000 mg | INTRAMUSCULAR | Status: DC | PRN
  Administered 2016-01-30: 1 mg via INTRAVENOUS

## 2016-01-30 MED ORDER — SODIUM CHLORIDE 0.9 % IV SOLN
Freq: Once | INTRAVENOUS | Status: AC
  Administered 2016-01-30: 10:00:00 via INTRAVENOUS

## 2016-01-30 MED ORDER — BORTEZOMIB CHEMO SQ INJECTION 3.5 MG (2.5MG/ML)
0.9750 mg/m2 | Freq: Once | INTRAMUSCULAR | Status: AC
  Administered 2016-01-30: 1.5 mg via SUBCUTANEOUS
  Filled 2016-01-30: qty 1.5

## 2016-01-30 MED ORDER — HYDROMORPHONE HCL 4 MG/ML IJ SOLN
INTRAMUSCULAR | Status: AC
  Filled 2016-01-30: qty 1

## 2016-01-30 MED ORDER — PROCHLORPERAZINE MALEATE 10 MG PO TABS
ORAL_TABLET | ORAL | Status: AC
  Filled 2016-01-30: qty 1

## 2016-01-30 MED ORDER — PROCHLORPERAZINE MALEATE 10 MG PO TABS
10.0000 mg | ORAL_TABLET | Freq: Once | ORAL | Status: AC
  Administered 2016-01-30: 10 mg via ORAL

## 2016-01-30 MED ORDER — DIPHENHYDRAMINE HCL 25 MG PO CAPS
50.0000 mg | ORAL_CAPSULE | Freq: Once | ORAL | Status: AC
  Administered 2016-01-30: 50 mg via ORAL

## 2016-01-30 NOTE — Patient Instructions (Signed)
[place] Discharge Instructions for Patients Receiving Chemotherapy  Today you received the following chemotherapy agents: Daratumumab and Vecade  To help prevent nausea and vomiting after your treatment, we encourage you to take your nausea medication as directed.    If you develop nausea and vomiting that is not controlled by your nausea medication, call the clinic.   BELOW ARE SYMPTOMS THAT SHOULD BE REPORTED IMMEDIATELY:  *FEVER GREATER THAN 100.5 F  *CHILLS WITH OR WITHOUT FEVER  NAUSEA AND VOMITING THAT IS NOT CONTROLLED WITH YOUR NAUSEA MEDICATION  *UNUSUAL SHORTNESS OF BREATH  *UNUSUAL BRUISING OR BLEEDING  TENDERNESS IN MOUTH AND THROAT WITH OR WITHOUT PRESENCE OF ULCERS  *URINARY PROBLEMS  *BOWEL PROBLEMS  UNUSUAL RASH Items with * indicate a potential emergency and should be followed up as soon as possible.  Feel free to call the clinic you have any questions or concerns. The clinic phone number is [phone].  Please show the [place] at check-in to the Emergency Department and triage nurse.

## 2016-01-31 ENCOUNTER — Telehealth: Payer: Self-pay | Admitting: *Deleted

## 2016-01-31 ENCOUNTER — Other Ambulatory Visit (HOSPITAL_COMMUNITY): Payer: Self-pay | Admitting: Hematology and Oncology

## 2016-01-31 DIAGNOSIS — R1310 Dysphagia, unspecified: Secondary | ICD-10-CM

## 2016-01-31 LAB — KAPPA/LAMBDA LIGHT CHAINS
IG LAMBDA FREE LIGHT CHAIN: 6.3 mg/L (ref 5.7–26.3)
Ig Kappa Free Light Chain: 703.9 mg/L — ABNORMAL HIGH (ref 3.3–19.4)
KAPPA/LAMBDA FLC RATIO: 111.73 — AB (ref 0.26–1.65)

## 2016-01-31 NOTE — Telephone Encounter (Signed)
1.  Called Rehab office to check on status of MBS ordered by Dr. Alvy Bimler a week ago.  S/w Abigail Butts who says they did get the order and she has tried to call the patient once but her VM was full today.  Abigail Butts will continue to work on reaching pt to schedule procedure.   2.  Called Dr. Arlean Hopping office and s/w Anderson Malta to check on request made a week ago for Dr. Estanislado Pandy to review pt's MRI.  Asked for their office to schedule appt for pt if he can offer any interventions.  Please call us back to let us know.

## 2016-02-03 LAB — MULTIPLE MYELOMA PANEL, SERUM
ALBUMIN/GLOB SERPL: 0.9 (ref 0.7–1.7)
ALPHA 1: 0.3 g/dL (ref 0.0–0.4)
ALPHA2 GLOB SERPL ELPH-MCNC: 0.7 g/dL (ref 0.4–1.0)
Albumin SerPl Elph-Mcnc: 3.4 g/dL (ref 2.9–4.4)
B-Globulin SerPl Elph-Mcnc: 0.9 g/dL (ref 0.7–1.3)
GAMMA GLOB SERPL ELPH-MCNC: 2.2 g/dL — AB (ref 0.4–1.8)
GLOBULIN, TOTAL: 4.1 g/dL — AB (ref 2.2–3.9)
IGA/IMMUNOGLOBULIN A, SERUM: 14 mg/dL — AB (ref 64–422)
IGM (IMMUNOGLOBIN M), SRM: 28 mg/dL (ref 26–217)
M Protein SerPl Elph-Mcnc: 2.1 g/dL — ABNORMAL HIGH
Total Protein: 7.5 g/dL (ref 6.0–8.5)

## 2016-02-04 ENCOUNTER — Ambulatory Visit (HOSPITAL_COMMUNITY): Admission: RE | Admit: 2016-02-04 | Payer: Medicare Other | Source: Ambulatory Visit

## 2016-02-04 ENCOUNTER — Other Ambulatory Visit (HOSPITAL_COMMUNITY): Payer: Medicare Other

## 2016-02-06 ENCOUNTER — Telehealth: Payer: Self-pay | Admitting: *Deleted

## 2016-02-06 ENCOUNTER — Ambulatory Visit (HOSPITAL_BASED_OUTPATIENT_CLINIC_OR_DEPARTMENT_OTHER): Payer: Medicare Other

## 2016-02-06 ENCOUNTER — Other Ambulatory Visit (HOSPITAL_BASED_OUTPATIENT_CLINIC_OR_DEPARTMENT_OTHER): Payer: Medicare Other

## 2016-02-06 ENCOUNTER — Ambulatory Visit (HOSPITAL_BASED_OUTPATIENT_CLINIC_OR_DEPARTMENT_OTHER): Payer: Medicare Other | Admitting: Hematology and Oncology

## 2016-02-06 ENCOUNTER — Ambulatory Visit: Payer: Medicare Other

## 2016-02-06 ENCOUNTER — Telehealth: Payer: Self-pay | Admitting: Hematology and Oncology

## 2016-02-06 VITALS — BP 135/60 | HR 87 | Temp 98.5°F | Resp 17

## 2016-02-06 VITALS — BP 138/71 | HR 68 | Temp 97.8°F | Resp 17 | Ht 64.0 in | Wt 108.7 lb

## 2016-02-06 DIAGNOSIS — C9001 Multiple myeloma in remission: Secondary | ICD-10-CM

## 2016-02-06 DIAGNOSIS — E440 Moderate protein-calorie malnutrition: Secondary | ICD-10-CM

## 2016-02-06 DIAGNOSIS — D61810 Antineoplastic chemotherapy induced pancytopenia: Secondary | ICD-10-CM | POA: Diagnosis not present

## 2016-02-06 DIAGNOSIS — C9002 Multiple myeloma in relapse: Secondary | ICD-10-CM

## 2016-02-06 DIAGNOSIS — G620 Drug-induced polyneuropathy: Secondary | ICD-10-CM | POA: Diagnosis not present

## 2016-02-06 DIAGNOSIS — M549 Dorsalgia, unspecified: Secondary | ICD-10-CM | POA: Diagnosis not present

## 2016-02-06 DIAGNOSIS — Z5112 Encounter for antineoplastic immunotherapy: Secondary | ICD-10-CM

## 2016-02-06 DIAGNOSIS — T451X5A Adverse effect of antineoplastic and immunosuppressive drugs, initial encounter: Secondary | ICD-10-CM

## 2016-02-06 DIAGNOSIS — Z515 Encounter for palliative care: Secondary | ICD-10-CM | POA: Diagnosis not present

## 2016-02-06 DIAGNOSIS — G8929 Other chronic pain: Secondary | ICD-10-CM | POA: Diagnosis not present

## 2016-02-06 LAB — CBC WITH DIFFERENTIAL/PLATELET
BASO%: 0 % (ref 0.0–2.0)
Basophils Absolute: 0 10*3/uL (ref 0.0–0.1)
EOS ABS: 0 10*3/uL (ref 0.0–0.5)
EOS%: 0 % (ref 0.0–7.0)
HEMATOCRIT: 26.8 % — AB (ref 34.8–46.6)
HEMOGLOBIN: 9.1 g/dL — AB (ref 11.6–15.9)
LYMPH#: 0.6 10*3/uL — AB (ref 0.9–3.3)
LYMPH%: 14.8 % (ref 14.0–49.7)
MCH: 35.1 pg — ABNORMAL HIGH (ref 25.1–34.0)
MCHC: 34 g/dL (ref 31.5–36.0)
MCV: 103.5 fL — ABNORMAL HIGH (ref 79.5–101.0)
MONO#: 0.1 10*3/uL (ref 0.1–0.9)
MONO%: 2.4 % (ref 0.0–14.0)
NEUT%: 82.8 % — ABNORMAL HIGH (ref 38.4–76.8)
NEUTROS ABS: 3.1 10*3/uL (ref 1.5–6.5)
Platelets: 118 10*3/uL — ABNORMAL LOW (ref 145–400)
RBC: 2.59 10*6/uL — ABNORMAL LOW (ref 3.70–5.45)
RDW: 16.9 % — AB (ref 11.2–14.5)
WBC: 3.7 10*3/uL — AB (ref 3.9–10.3)

## 2016-02-06 LAB — COMPREHENSIVE METABOLIC PANEL
ALBUMIN: 3.3 g/dL — AB (ref 3.5–5.0)
ALK PHOS: 91 U/L (ref 40–150)
ALT: 26 U/L (ref 0–55)
AST: 32 U/L (ref 5–34)
Anion Gap: 9 mEq/L (ref 3–11)
BILIRUBIN TOTAL: 0.56 mg/dL (ref 0.20–1.20)
BUN: 19.1 mg/dL (ref 7.0–26.0)
CALCIUM: 8.7 mg/dL (ref 8.4–10.4)
CO2: 23 mEq/L (ref 22–29)
CREATININE: 0.7 mg/dL (ref 0.6–1.1)
Chloride: 103 mEq/L (ref 98–109)
EGFR: 73 mL/min/{1.73_m2} — ABNORMAL LOW (ref >90)
GLUCOSE: 96 mg/dL (ref 70–140)
Potassium: 4.2 mEq/L (ref 3.5–5.1)
SODIUM: 135 meq/L — AB (ref 136–145)
TOTAL PROTEIN: 8.3 g/dL (ref 6.4–8.3)

## 2016-02-06 MED ORDER — MORPHINE SULFATE ER 30 MG PO TBCR: 30.0000 mg | EXTENDED_RELEASE_TABLET | Freq: Two times a day (BID) | ORAL | 0 refills | Status: DC

## 2016-02-06 MED ORDER — SODIUM CHLORIDE 0.9 % IV SOLN
Freq: Once | INTRAVENOUS | Status: AC
  Administered 2016-02-06: 10:00:00 via INTRAVENOUS

## 2016-02-06 MED ORDER — ACETAMINOPHEN 325 MG PO TABS
650.0000 mg | ORAL_TABLET | Freq: Once | ORAL | Status: AC
  Administered 2016-02-06: 650 mg via ORAL

## 2016-02-06 MED ORDER — HEPARIN SOD (PORK) LOCK FLUSH 100 UNIT/ML IV SOLN
500.0000 [IU] | Freq: Once | INTRAVENOUS | Status: AC | PRN
  Administered 2016-02-06: 500 [IU]
  Filled 2016-02-06: qty 5

## 2016-02-06 MED ORDER — DARATUMUMAB CHEMO INJECTION 400 MG/20ML: 16.0000 mg/kg | Freq: Once | INTRAVENOUS | Status: DC

## 2016-02-06 MED ORDER — DIPHENHYDRAMINE HCL 25 MG PO CAPS
ORAL_CAPSULE | ORAL | Status: AC
  Filled 2016-02-06: qty 2

## 2016-02-06 MED ORDER — OXYCODONE HCL 20 MG PO TABS: 20.0000 mg | ORAL_TABLET | ORAL | 0 refills | Status: DC | PRN

## 2016-02-06 MED ORDER — METHYLPREDNISOLONE SODIUM SUCC 125 MG IJ SOLR
INTRAMUSCULAR | Status: AC
  Filled 2016-02-06: qty 2

## 2016-02-06 MED ORDER — PROCHLORPERAZINE MALEATE 10 MG PO TABS
ORAL_TABLET | ORAL | Status: AC
  Filled 2016-02-06: qty 1

## 2016-02-06 MED ORDER — POMALIDOMIDE 3 MG PO CAPS: 3.0000 mg | ORAL_CAPSULE | Freq: Every day | ORAL | 9 refills | Status: DC

## 2016-02-06 MED ORDER — SODIUM CHLORIDE 0.9 % IV SOLN
800.0000 mg | Freq: Once | INTRAVENOUS | Status: AC
  Administered 2016-02-06: 800 mg via INTRAVENOUS
  Filled 2016-02-06: qty 40

## 2016-02-06 MED ORDER — METHYLPREDNISOLONE SODIUM SUCC 125 MG IJ SOLR
125.0000 mg | Freq: Once | INTRAMUSCULAR | Status: AC
  Administered 2016-02-06: 125 mg via INTRAVENOUS

## 2016-02-06 MED ORDER — SODIUM CHLORIDE 0.9% FLUSH
10.0000 mL | INTRAVENOUS | Status: DC | PRN
  Administered 2016-02-06: 10 mL
  Filled 2016-02-06: qty 10

## 2016-02-06 MED ORDER — PROCHLORPERAZINE MALEATE 10 MG PO TABS
10.0000 mg | ORAL_TABLET | Freq: Once | ORAL | Status: AC
  Administered 2016-02-06: 10 mg via ORAL

## 2016-02-06 MED ORDER — ACETAMINOPHEN 325 MG PO TABS
ORAL_TABLET | ORAL | Status: AC
  Filled 2016-02-06: qty 2

## 2016-02-06 MED ORDER — SODIUM CHLORIDE 0.9 % IJ SOLN
10.0000 mL | INTRAMUSCULAR | Status: AC | PRN
  Administered 2016-02-06: 10 mL
  Filled 2016-02-06: qty 10

## 2016-02-06 MED ORDER — DIPHENHYDRAMINE HCL 25 MG PO CAPS
50.0000 mg | ORAL_CAPSULE | Freq: Once | ORAL | Status: AC
  Administered 2016-02-06: 50 mg via ORAL

## 2016-02-06 NOTE — Telephone Encounter (Signed)
Per staff message and POF I have scheduled appts. Advised scheduler of appts. JMW  

## 2016-02-06 NOTE — Progress Notes (Signed)
[facility] OFFICE PROGRESS NOTE  Patient Care Team: Thressa Sheller, MD as PCP - General (Internal Medicine) Thressa Sheller, MD (Internal Medicine) Renella Cunas, MD (Inactive) as Attending Physician (Cardiology) Heath Lark, MD as Consulting Physician (Hematology and Oncology) Luanne Bras, MD as Consulting Physician (Interventional Radiology)  SUMMARY OF ONCOLOGIC HISTORY: Oncology History   The     Multiple myeloma in relapse [facility])   11/12/2008 Imaging    Skeletal survey showed lytic lesion in the right femur compatible with  myeloma. There were Questionable skull lesions     11/15/2008 Bone Marrow Biopsy    BONE MARROW ASPIRATE AND BIOPSY: showed NORMOCELLULAR MARROW FOR AGE WITH PLASMA CELL DYSCRASIA  (PLASMA CELLS 25%). Cytogenetics showed 13q- and FISh was positive for CCND1     12/04/2008 - 04/26/2013 Chemotherapy    Patient was placed initially on Revlimid/Melphalan/Dexamethasone but developed severe pancytopenia. Subsequently she was placed on Revlimid & Dexamethasone alone     05/12/2013 Bone Marrow Biopsy    Bone marrow biopsy showed persistent plasma cells. Blood work confirmed VGPR status     06/11/2014 Tumor Marker    Bloodwork show disease relapse     06/26/2014 Procedure    She has placement of port     07/05/2014 - 07/26/2014 Chemotherapy    She received Elotuzumab and Revlimid. Rx was discontinued with elotuzumab per patient preference     08/01/2014 - 08/05/2014 Hospital Admission    She was admitted to the hospital for kyphoplasty and pain management after traumatic compression fracture     08/23/2014 - 04/18/2015 Chemotherapy    She was placed on dexamethasone & Revlimid     04/11/2015 Tumor Marker    Blood work showed VGPR. Treatment was discontinued and she is maintained on Zometa only     10/20/2015 Imaging    MRI spine showed acute/subacute superior endplate compression fractures at T2 and T9 are incompletely healed.      11/05/2015  Procedure    Status post vertebral body augmentation using balloon kyphoplasty at T9 as described without event     12/27/2015 Procedure    She had port placement     01/02/2016 -  Chemotherapy    She received Daratumumab, Dexamethasone and Velcade     01/20/2016 Imaging    MRi spine showed acute to subacute pathologic fractures of T10 and T11. Up to 70% loss of vertebral body height at the former with mild retropulsion of bone contributing to mild spinal stenosis at the T10-T11 level.      INTERVAL HISTORY: Please see below for problem oriented charting. She is seen before treatment. She complained of uncontrolled pain. The pain is severe that it wakes her up at night. She is limiting herself to take no more than 3 oxycodone a day because she is afraid she might be addicted to pain medicine. Once she takes the pain medicine, she feels great relief within 30 minutes but still in pain. She would like to have something stronger but is afraid to take them. She denies nausea or constipation. However, her daughter later told me that she has been suffering from severe constipation at home. The patient is noted to be forgetful and depressed at home. She denies recent fever. Her appetite remained poor. She felt that she cannot eat because of pain. The patient denies any recent signs or symptoms of bleeding such as spontaneous epistaxis, hematuria or hematochezia.  REVIEW OF SYSTEMS:   Constitutional: Denies fevers, chills or abnormal weight  loss Eyes: Denies blurriness of vision Ears, nose, mouth, throat, and face: Denies mucositis or sore throat Respiratory: Denies cough, dyspnea or wheezes Cardiovascular: Denies palpitation, chest discomfort or lower extremity swelling Skin: Denies abnormal skin rashes Lymphatics: Denies new lymphadenopathy or easy bruising Neurological:Denies numbness, tingling or new weaknesses Behavioral/Psych: Mood is stable, no new changes  All other systems were  reviewed with the patient and are negative.  I have reviewed the past medical history, past surgical history, social history and family history with the patient and they are unchanged from previous note.  ALLERGIES:  has No Known Allergies.  MEDICATIONS:  Current Outpatient Prescriptions  Medication Sig Dispense Refill  . acyclovir (ZOVIRAX) 400 MG tablet Take 1 tablet (400 mg total) by mouth daily. 60 tablet 11  . aspirin 325 MG EC tablet Take 325 mg by mouth daily.    . cholecalciferol (VITAMIN D) 1000 UNITS tablet Take 1,000 Units by mouth daily.    [name] dexamethasone (DECADRON) 4 MG tablet 20 mg weekly on Thursday 20 tablet 4  . diphenhydramine-acetaminophen (TYLENOL PM) 25-500 MG TABS Take 1 tablet by mouth at bedtime as needed (Sleep). Reported on 01/09/2016    . gabapentin (NEURONTIN) 300 MG capsule TAKE ONE CAPSULE BY MOUTH THREE TIMES DAILY 90 capsule 3  . levothyroxine (SYNTHROID, LEVOTHROID) 50 MCG tablet Take 50 mcg by mouth daily before breakfast.    . mirtazapine (REMERON) 7.5 MG tablet TAKE ONE TABLET BY MOUTH NIGHTLY AT BEDTIME 30 tablet 3  . Multiple Vitamin (MULTIVITAMIN WITH MINERALS) TABS tablet Take 1 tablet by mouth daily.    [name] omeprazole (PRILOSEC) 20 MG capsule Take 1 capsule (20 mg total) by mouth daily. 90 capsule 1  . ondansetron (ZOFRAN) 8 MG tablet Take 1 tablet (8 mg total) by mouth every 8 (eight) hours as needed for nausea or vomiting. 60 tablet 3  . Polyvinyl Alcohol-Povidone (REFRESH OP) Place 2 drops into both eyes 2 (two) times daily as needed (dry eyes).    . traMADol (ULTRAM) 50 MG tablet Take 2 tablets (100 mg total) by mouth every 6 (six) hours as needed for moderate pain (one to two tablets every 6 hrs as needed.). 90 tablet 0  . valsartan (DIOVAN) 160 MG tablet Take 1 tablet (160 mg total) by mouth daily. 30 tablet 3  . cyclobenzaprine (FLEXERIL) 5 MG tablet Take 1 tablet (5 mg total) by mouth 3 (three) times daily as needed for muscle spasms. (Patient not  taking: Reported on 02/06/2016) 30 tablet 0  . loperamide (IMODIUM A-D) 2 MG tablet Take 2 mg by mouth as needed for diarrhea or loose stools (as directed.). Reported on 01/09/2016    . morphine (MS CONTIN) 30 MG 12 hr tablet Take 1 tablet (30 mg total) by mouth every 12 (twelve) hours. 60 tablet 0  . oxyCODONE 20 MG TABS Take 1 tablet (20 mg total) by mouth every 4 (four) hours as needed for severe pain. 90 tablet 0  . pomalidomide (POMALYST) 3 MG capsule Take 1 capsule (3 mg total) by mouth daily. Take with water on days 1-21. Repeat every 28 days. 21 capsule 9  . prochlorperazine (COMPAZINE) 10 MG tablet Take 1 tablet (10 mg total) by mouth every 6 (six) hours as needed for nausea or vomiting. (Patient not taking: Reported on 01/09/2016) 60 tablet 3   No current facility-administered medications for this visit.     PHYSICAL EXAMINATION: ECOG PERFORMANCE STATUS: 2 - Symptomatic, <50% confined to bed  Vitals:  02/06/16 0849  BP: 138/71  Pulse: 68  Resp: 17  Temp: 97.8 F (36.6 C)   Filed Weights   02/06/16 0849  Weight: 108 lb 11.2 oz (49.3 kg)    GENERAL:alert, no distress and comfortable. She looks thin and cachectic SKIN: skin color, texture, turgor are normal, no rashes or significant lesions EYES: normal, Conjunctiva are pink and non-injected, sclera clear Musculoskeletal:no cyanosis of digits and no clubbing  NEURO: alert & oriented x 3 with fluent speech, no focal motor/sensory deficits. She appears tearful  LABORATORY DATA:  I have reviewed the data as listed    Component Value Date/Time   NA 135 (L) 02/06/2016 0825   K 4.2 02/06/2016 0825   CL 100 (L) 11/05/2015 0700   CL 103 11/25/2012 1328   CO2 23 02/06/2016 0825   GLUCOSE 96 02/06/2016 0825   GLUCOSE 91 11/25/2012 1328   BUN 19.1 02/06/2016 0825   CREATININE 0.7 02/06/2016 0825   CALCIUM 8.7 02/06/2016 0825   PROT 8.3 02/06/2016 0825   ALBUMIN 3.3 (L) 02/06/2016 0825   AST 32 02/06/2016 0825   ALT 26  02/06/2016 0825   ALKPHOS 91 02/06/2016 0825   BILITOT 0.56 02/06/2016 0825   GFRNONAA 57 (L) 11/05/2015 0700   GFRAA >60 11/05/2015 0700    No results found for: SPEP, UPEP  Lab Results  Component Value Date   WBC 3.7 (L) 02/06/2016   NEUTROABS 3.1 02/06/2016   HGB 9.1 (L) 02/06/2016   HCT 26.8 (L) 02/06/2016   MCV 103.5 (H) 02/06/2016   PLT 118 (L) 02/06/2016      Chemistry      Component Value Date/Time   NA 135 (L) 02/06/2016 0825   K 4.2 02/06/2016 0825   CL 100 (L) 11/05/2015 0700   CL 103 11/25/2012 1328   CO2 23 02/06/2016 0825   BUN 19.1 02/06/2016 0825   CREATININE 0.7 02/06/2016 0825      Component Value Date/Time   CALCIUM 8.7 02/06/2016 0825   ALKPHOS 91 02/06/2016 0825   AST 32 02/06/2016 0825   ALT 26 02/06/2016 0825   BILITOT 0.56 02/06/2016 0825       ASSESSMENT & PLAN:  Multiple myeloma in relapse ([place]) Unfortunately, the patient had minimum response to recent treatment. Due to her history of pancytopenia, I recommend to keep Daratumumab but to discontinue Velcade and add Pomalidomide This is based on recent FDA approval and publications as follows  Daratumumab plus pomalidomide and dexamethasone in relapsed and/or refractory multiple myeloma Jacklynn Barnacle, Clare Charon, Angeline Slim, Ellis Parents, York Pellant, [place] Ifthikharuddin, Pernell Dupre. Theresia Majors, Rhetta Mura Comenzo, Jianping Wang, Kerri Nottage, Audry Riles, Nushmia ZDelories Heinz, Lynetta Mare and Almyra Free  Blood 2017 :blood-[id number]; doi: [email or web address]   Daratumumab plus pomalidomide/dexamethasone (pom-dex) was evaluated in patients with relapsed/refractory multiple myeloma with ?2 prior lines of therapy, and who were refractory to their last treatment.   Patients received daratumumab 16 mg/kg at the recommended dosing schedule, pomalidomide 4 mg daily for 21 days of each 28-day cycle, and  dexamethasone 40 mg weekly. Safety was the primary endpoint.   Overall response rate (ORR) and minimal residual disease (MRD) by next-generation sequencing were secondary endpoints. Patients (N = 103) received a median (range) of 4 (1-13) prior therapies; 76% received ?3 prior therapies. The safety profile of daratumumab plus pom-dex was similar to that of pom-dex alone, with the exception of daratumumab-specific infusion-related reactions (50%) and a higher incidence  of neutropenia, although without an increase in infection rate. Common grade ?3 adverse events were neutropenia (78%), anemia (28%), and leukopenia (24%). ORR was 60% and was generally consistent across subgroups (58% in double-refractory patients).   Among patients with a complete response or better, 29% were MRD-negative at a threshold of 10-5. Among the 62 responders, median duration of response was not estimable (NE; 95% CI, 13.6-NE). At a median follow-up of 13.1 months, the median progression-free survival was 8.8 (95% CI, 4.6-15.4) months and median overall survival was 17.5 (95% CI, 13.3-NE) months. The estimated 16-monthsurvival rate was 66% (95% CI, 55.6-74.8). Aside from increased neutropenia, the safety profile of daratumumab plus pom-dex was consistent with that of the individual therapies. Deep, durable responses were observed in heavily treated patients  Infusion reactions are common side effects.  Some of the short term side-effects included, though not limited to, risk of fatigue, weight loss, tumor lysis syndrome, risk of allergic reactions, pancytopenia, life-threatening infections, need for transfusions of blood products, admission to hospital for various reasons, and risks of death.   The patient is aware that the response rates discussed earlier is not guaranteed.    After a long discussion, patient made an informed decision to proceed with the prescribed plan of care.   The patient understands that the role of  treatment is strictly palliative I will continue to see her on the regular basis for management of symptom control. We also discussed potential discontinuation of treatment and focus on quality of life. For now, she wants to continue on treatment       Chronic back pain greater than 3 months duration I spent a lot of time discussing pain management with the patient and family members. Ultimately, after extensive discussion, the patient revealed to me that the reason why she is not taking pain medicine as prescribed this because she had the fear that she will be addicted to pain medicine. I educated the patient that she will not become addicted to pain medicine and the use of pain medicine and his situation is necessary to control her pain and give her good quality of life Her pain is not under control despite increasing oxycodone to 15 mg. After extensive discussion, she is in agreement to try to be started on MS Contin 30 mg twice a day and I will increase oxycodone to 20 mg by mouth as needed every 4 hours I will reassess her pain again next week In addition, I will also start her on 4 mg dexamethasone daily  Pancytopenia due to antineoplastic chemotherapy ([facility] She is not symptomatic. 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 She is referred to see a dietitian today. She also has significant history of depression that would also contributed due to anorexia. She is on Remeron I encouraged her to eat frequent small meals. I will also start her on low dose daily dexamethasone  Quality of life palliative care encounter -I spent a total of almost 1 hour discussing with the patient and her daughter. The patient is unclear about her wishes. She is  aware that her disease is serious and the goal of treatment is strictly palliative. I recommend the daughter to consider family discussion and appointment of medical healthcare power of attorney and to discuss CODE STATUS at home.  At some point, due to uncontrolled pain, the patient had verbalized thoughts of possibly discontinuation of treatment We discussed possibility of home-based palliative care referral for symptomatic management. We also discussed possibility of discontinuation of treatment and focus on quality of life. After extensive discussion, the patient wants to continue treatment. Her daughter, April would discuss with family members about future plan of care   No orders of the defined types were placed in this encounter.  All questions were answered. The patient knows to call the clinic with any problems, questions or concerns. No barriers to learning was detected. I spent 60 minutes counseling the patient face to face. The total time spent in the appointment was 80 minutes and more than 50% was on counseling and review of test results     [facility], Leonie Amacher, MD 02/07/2016 5:44 PM

## 2016-02-06 NOTE — Telephone Encounter (Signed)
per pof to sch pt appt-pt to get updated copy b4 leaving trmtroom °

## 2016-02-06 NOTE — Telephone Encounter (Signed)
Pt will get updated sched in tx °

## 2016-02-07 ENCOUNTER — Ambulatory Visit: Payer: Medicare Other | Admitting: Cardiovascular Disease

## 2016-02-07 ENCOUNTER — Other Ambulatory Visit: Payer: Self-pay | Admitting: Pharmacist

## 2016-02-07 DIAGNOSIS — C9002 Multiple myeloma in relapse: Secondary | ICD-10-CM

## 2016-02-07 DIAGNOSIS — Z515 Encounter for palliative care: Secondary | ICD-10-CM | POA: Insufficient documentation

## 2016-02-07 MED ORDER — POMALIDOMIDE 3 MG PO CAPS: 3.0000 mg | ORAL_CAPSULE | Freq: Every day | ORAL | 9 refills | Status: DC

## 2016-02-07 NOTE — Assessment & Plan Note (Signed)
She has poor appetite, weight loss and evidence of protein calorie malnutrition. She is referred to see a dietitian today. She also has significant history of depression that would also contributed due to anorexia. She is on Remeron I encouraged her to eat frequent small meals. I will also start her on low dose daily dexamethasone

## 2016-02-07 NOTE — Assessment & Plan Note (Signed)
I spent a lot of time discussing pain management with the patient and family members. Ultimately, after extensive discussion, the patient revealed to me that the reason why she is not taking pain medicine as prescribed this because she had the fear that she will be addicted to pain medicine. I educated the patient that she will not become addicted to pain medicine and the use of pain medicine and his situation is necessary to control her pain and give her good quality of life Her pain is not under control despite increasing oxycodone to 15 mg. After extensive discussion, she is in agreement to try to be started on MS Contin 30 mg twice a day and I will increase oxycodone to 20 mg by mouth as needed every 4 hours I will reassess her pain again next week In addition, I will also start her on 4 mg dexamethasone daily

## 2016-02-07 NOTE — Assessment & Plan Note (Signed)
Unfortunately, the patient had minimum response to recent treatment. Due to her history of pancytopenia, I recommend to keep Daratumumab but to discontinue Velcade and add Pomalidomide This is based on recent FDA approval and publications as follows  Daratumumab plus pomalidomide and dexamethasone in relapsed and/or refractory multiple myeloma Jacklynn Barnacle, Clare Charon, Angeline Slim, Ellis Parents, York Pellant, [place] Ifthikharuddin, Pernell Dupre. Theresia Majors, Rhetta Mura Comenzo, Jianping Wang, Kerri Nottage, Audry Riles, Nushmia ZDelories Heinz, Lynetta Mare and Almyra Free  Blood 2017 :blood-[id number]; doi: [email or web address]   Daratumumab plus pomalidomide/dexamethasone (pom-dex) was evaluated in patients with relapsed/refractory multiple myeloma with ?2 prior lines of therapy, and who were refractory to their last treatment.   Patients received daratumumab 16 mg/kg at the recommended dosing schedule, pomalidomide 4 mg daily for 21 days of each 28-day cycle, and dexamethasone 40 mg weekly. Safety was the primary endpoint.   Overall response rate (ORR) and minimal residual disease (MRD) by next-generation sequencing were secondary endpoints. Patients (N = 103) received a median (range) of 4 (1-13) prior therapies; 76% received ?3 prior therapies. The safety profile of daratumumab plus pom-dex was similar to that of pom-dex alone, with the exception of daratumumab-specific infusion-related reactions (50%) and a higher incidence of neutropenia, although without an increase in infection rate. Common grade ?3 adverse events were neutropenia (78%), anemia (28%), and leukopenia (24%). ORR was 60% and was generally consistent across subgroups (58% in double-refractory patients).   Among patients with a complete response or better, 29% were MRD-negative at a threshold of 10-5. Among the 62 responders, median duration of  response was not estimable (NE; 95% CI, 13.6-NE). At a median follow-up of 13.1 months, the median progression-free survival was 8.8 (95% CI, 4.6-15.4) months and median overall survival was 17.5 (95% CI, 13.3-NE) months. The estimated 9-monthsurvival rate was 66% (95% CI, 55.6-74.8). Aside from increased neutropenia, the safety profile of daratumumab plus pom-dex was consistent with that of the individual therapies. Deep, durable responses were observed in heavily treated patients  Infusion reactions are common side effects.  Some of the short term side-effects included, though not limited to, risk of fatigue, weight loss, tumor lysis syndrome, risk of allergic reactions, pancytopenia, life-threatening infections, need for transfusions of blood products, admission to hospital for various reasons, and risks of death.   The patient is aware that the response rates discussed earlier is not guaranteed.    After a long discussion, patient made an informed decision to proceed with the prescribed plan of care.   The patient understands that the role of treatment is strictly palliative I will continue to see her on the regular basis for management of symptom control. We also discussed potential discontinuation of treatment and focus on quality of life. For now, she wants to continue on treatment

## 2016-02-07 NOTE — Assessment & Plan Note (Signed)
She is not symptomatic. I will continue Daratumumab She does not need transfusion

## 2016-02-07 NOTE — Assessment & Plan Note (Signed)
This is stable Will discontinue Velcade as above

## 2016-02-07 NOTE — Progress Notes (Signed)
Oral Chemotherapy Pharmacist Encounter   Received Pomalyst 3mg  script from [facility], RN Labs ok, no DDIs with Pomalyst noted except increased risk of CNS depression with addition of Pomalyst to MS Contin, neurontin, and oxycodone. Pt will be monitored closely. Re-ordering Rx through [facility] as this is where pt was getting Revlimid Rx has been e-scribed Additional notes to pharmacy on Rx: adult female of non-childbearing age, Josem Kaufmann [id number]  Johny Drilling, PharmD, BCPS [facility]

## 2016-02-07 NOTE — Assessment & Plan Note (Signed)
-  I spent a total of almost 1 hour discussing with the patient and her daughter. The patient is unclear about her wishes. She is aware that her disease is serious and the goal of treatment is strictly palliative. I recommend the daughter to consider family discussion and appointment of medical healthcare power of attorney and to discuss CODE STATUS at home. At some point, due to uncontrolled pain, the patient had verbalized thoughts of possibly discontinuation of treatment We discussed possibility of home-based palliative care referral for symptomatic management. We also discussed possibility of discontinuation of treatment and focus on quality of life. After extensive discussion, the patient wants to continue treatment. Her daughter, April would discuss with family members about future plan of care

## 2016-02-12 ENCOUNTER — Ambulatory Visit (HOSPITAL_COMMUNITY)
Admission: RE | Admit: 2016-02-12 | Discharge: 2016-02-12 | Disposition: A | Discharge status: Home / Self Care | Payer: Medicare Other | Source: Ambulatory Visit | Attending: Hematology and Oncology | Admitting: Hematology and Oncology

## 2016-02-12 DIAGNOSIS — F329 Major depressive disorder, single episode, unspecified: Secondary | ICD-10-CM | POA: Diagnosis not present

## 2016-02-12 DIAGNOSIS — Z853 Personal history of malignant neoplasm of breast: Secondary | ICD-10-CM | POA: Insufficient documentation

## 2016-02-12 DIAGNOSIS — G629 Polyneuropathy, unspecified: Secondary | ICD-10-CM | POA: Insufficient documentation

## 2016-02-12 DIAGNOSIS — R1310 Dysphagia, unspecified: Secondary | ICD-10-CM | POA: Insufficient documentation

## 2016-02-12 DIAGNOSIS — R1313 Dysphagia, pharyngeal phase: Secondary | ICD-10-CM | POA: Diagnosis not present

## 2016-02-12 DIAGNOSIS — I10 Essential (primary) hypertension: Secondary | ICD-10-CM | POA: Insufficient documentation

## 2016-02-12 DIAGNOSIS — E039 Hypothyroidism, unspecified: Secondary | ICD-10-CM | POA: Diagnosis not present

## 2016-02-12 DIAGNOSIS — I2510 Atherosclerotic heart disease of native coronary artery without angina pectoris: Secondary | ICD-10-CM | POA: Diagnosis not present

## 2016-02-12 DIAGNOSIS — C9000 Multiple myeloma not having achieved remission: Secondary | ICD-10-CM | POA: Diagnosis not present

## 2016-02-13 ENCOUNTER — Other Ambulatory Visit (HOSPITAL_BASED_OUTPATIENT_CLINIC_OR_DEPARTMENT_OTHER): Payer: Medicare Other

## 2016-02-13 ENCOUNTER — Ambulatory Visit (HOSPITAL_BASED_OUTPATIENT_CLINIC_OR_DEPARTMENT_OTHER): Payer: Medicare Other

## 2016-02-13 ENCOUNTER — Telehealth: Payer: Self-pay | Admitting: *Deleted

## 2016-02-13 ENCOUNTER — Ambulatory Visit: Payer: Medicare Other

## 2016-02-13 VITALS — BP 115/57 | HR 80 | Temp 98.5°F | Resp 18

## 2016-02-13 DIAGNOSIS — C9001 Multiple myeloma in remission: Secondary | ICD-10-CM

## 2016-02-13 DIAGNOSIS — C9002 Multiple myeloma in relapse: Secondary | ICD-10-CM

## 2016-02-13 DIAGNOSIS — M4850XS Collapsed vertebra, not elsewhere classified, site unspecified, sequela of fracture: Secondary | ICD-10-CM

## 2016-02-13 DIAGNOSIS — IMO0001 Reserved for inherently not codable concepts without codable children: Secondary | ICD-10-CM

## 2016-02-13 DIAGNOSIS — G8929 Other chronic pain: Secondary | ICD-10-CM

## 2016-02-13 DIAGNOSIS — Z5112 Encounter for antineoplastic immunotherapy: Secondary | ICD-10-CM | POA: Diagnosis present

## 2016-02-13 DIAGNOSIS — M549 Dorsalgia, unspecified: Secondary | ICD-10-CM

## 2016-02-13 LAB — CBC WITH DIFFERENTIAL/PLATELET
BASO%: 0 % (ref 0.0–2.0)
Basophils Absolute: 0 10*3/uL (ref 0.0–0.1)
EOS%: 0.3 % (ref 0.0–7.0)
Eosinophils Absolute: 0 10*3/uL (ref 0.0–0.5)
HCT: 25.2 % — ABNORMAL LOW (ref 34.8–46.6)
HGB: 8.5 g/dL — ABNORMAL LOW (ref 11.6–15.9)
LYMPH%: 40.1 % (ref 14.0–49.7)
MCH: 35.3 pg — ABNORMAL HIGH (ref 25.1–34.0)
MCHC: 33.7 g/dL (ref 31.5–36.0)
MCV: 104.6 fL — ABNORMAL HIGH (ref 79.5–101.0)
MONO#: 0.1 10*3/uL (ref 0.1–0.9)
MONO%: 1.5 % (ref 0.0–14.0)
NEUT%: 58.1 % (ref 38.4–76.8)
NEUTROS ABS: 2 10*3/uL (ref 1.5–6.5)
PLATELETS: 155 10*3/uL (ref 145–400)
RBC: 2.41 10*6/uL — AB (ref 3.70–5.45)
RDW: 17.4 % — ABNORMAL HIGH (ref 11.2–14.5)
WBC: 3.4 10*3/uL — AB (ref 3.9–10.3)
lymph#: 1.4 10*3/uL (ref 0.9–3.3)

## 2016-02-13 LAB — COMPREHENSIVE METABOLIC PANEL
ALT: 15 U/L (ref 0–55)
ANION GAP: 7 meq/L (ref 3–11)
AST: 19 U/L (ref 5–34)
Albumin: 3.1 g/dL — ABNORMAL LOW (ref 3.5–5.0)
Alkaline Phosphatase: 72 U/L (ref 40–150)
BILIRUBIN TOTAL: 0.46 mg/dL (ref 0.20–1.20)
BUN: 18.9 mg/dL (ref 7.0–26.0)
CHLORIDE: 100 meq/L (ref 98–109)
CO2: 26 meq/L (ref 22–29)
CREATININE: 0.7 mg/dL (ref 0.6–1.1)
Calcium: 8.6 mg/dL (ref 8.4–10.4)
EGFR: 77 mL/min/{1.73_m2} — ABNORMAL LOW (ref >90)
GLUCOSE: 95 mg/dL (ref 70–140)
Potassium: 4 mEq/L (ref 3.5–5.1)
SODIUM: 133 meq/L — AB (ref 136–145)
TOTAL PROTEIN: 7.7 g/dL (ref 6.4–8.3)

## 2016-02-13 MED ORDER — METHYLPREDNISOLONE SODIUM SUCC 125 MG IJ SOLR
INTRAMUSCULAR | Status: AC
  Filled 2016-02-13: qty 2

## 2016-02-13 MED ORDER — SODIUM CHLORIDE 0.9 % IJ SOLN
10.0000 mL | INTRAMUSCULAR | Status: DC | PRN
  Administered 2016-02-13: 10 mL
  Filled 2016-02-13: qty 10

## 2016-02-13 MED ORDER — PROCHLORPERAZINE MALEATE 10 MG PO TABS
10.0000 mg | ORAL_TABLET | Freq: Once | ORAL | Status: AC
  Administered 2016-02-13: 10 mg via ORAL

## 2016-02-13 MED ORDER — ACETAMINOPHEN 325 MG PO TABS
650.0000 mg | ORAL_TABLET | Freq: Once | ORAL | Status: AC
  Administered 2016-02-13: 650 mg via ORAL

## 2016-02-13 MED ORDER — METHYLPREDNISOLONE SODIUM SUCC 125 MG IJ SOLR
125.0000 mg | Freq: Once | INTRAMUSCULAR | Status: AC
  Administered 2016-02-13: 125 mg via INTRAVENOUS

## 2016-02-13 MED ORDER — SODIUM CHLORIDE 0.9 % IV SOLN
Freq: Once | INTRAVENOUS | Status: AC
  Administered 2016-02-13: 10:00:00 via INTRAVENOUS

## 2016-02-13 MED ORDER — ACETAMINOPHEN 325 MG PO TABS
ORAL_TABLET | ORAL | Status: AC
  Filled 2016-02-13: qty 2

## 2016-02-13 MED ORDER — DIPHENHYDRAMINE HCL 25 MG PO CAPS
50.0000 mg | ORAL_CAPSULE | Freq: Once | ORAL | Status: AC
  Administered 2016-02-13: 50 mg via ORAL

## 2016-02-13 MED ORDER — DIPHENHYDRAMINE HCL 25 MG PO CAPS
ORAL_CAPSULE | ORAL | Status: AC
  Filled 2016-02-13: qty 2

## 2016-02-13 MED ORDER — PROCHLORPERAZINE MALEATE 10 MG PO TABS
ORAL_TABLET | ORAL | Status: AC
  Filled 2016-02-13: qty 1

## 2016-02-13 MED ORDER — HEPARIN SOD (PORK) LOCK FLUSH 100 UNIT/ML IV SOLN
500.0000 [IU] | Freq: Once | INTRAVENOUS | Status: AC | PRN
  Administered 2016-02-13: 500 [IU]
  Filled 2016-02-13: qty 5

## 2016-02-13 MED ORDER — SODIUM CHLORIDE 0.9 % IV SOLN
15.2000 mg/kg | Freq: Once | INTRAVENOUS | Status: AC
  Administered 2016-02-13: 800 mg via INTRAVENOUS
  Filled 2016-02-13: qty 40

## 2016-02-13 MED ORDER — SODIUM CHLORIDE 0.9% FLUSH
10.0000 mL | INTRAVENOUS | Status: DC | PRN
  Administered 2016-02-13: 10 mL
  Filled 2016-02-13: qty 10

## 2016-02-13 NOTE — Patient Instructions (Signed)
[facility] Discharge Instructions for Patients Receiving Chemotherapy  Today you received the following chemotherapy agents: Daratumumab  To help prevent nausea and vomiting after your treatment, we encourage you to take your nausea medication as directed.    If you develop nausea and vomiting that is not controlled by your nausea medication, call the clinic.   BELOW ARE SYMPTOMS THAT SHOULD BE REPORTED IMMEDIATELY:  *FEVER GREATER THAN 100.5 F  *CHILLS WITH OR WITHOUT FEVER  NAUSEA AND VOMITING THAT IS NOT CONTROLLED WITH YOUR NAUSEA MEDICATION  *UNUSUAL SHORTNESS OF BREATH  *UNUSUAL BRUISING OR BLEEDING  TENDERNESS IN MOUTH AND THROAT WITH OR WITHOUT PRESENCE OF ULCERS  *URINARY PROBLEMS  *BOWEL PROBLEMS  UNUSUAL RASH Items with * indicate a potential emergency and should be followed up as soon as possible.  Feel free to call the clinic you have any questions or concerns. The clinic phone number is [phone].  Please show the CHEMO ALERT CARD at check-in to the Emergency Department and triage nurse.   

## 2016-02-13 NOTE — Telephone Encounter (Signed)
S/w pt in Infusion today. 1.  Regarding pain management pt states her current regimen working well, states she is taking ms contin twice a day and oxycodone about every 3 to 4 hrs.  She is not taking Tramadol any more and says she does not need any refills today. 2.  Regarding Swallowing Test yesterday showed some dysphagia.   Pt states she understands instructions given by SLP for safe and more effective swallowing.  She understands to let us know if swallowing gets worse and Dr. Alvy Bimler will make referral to SLP.   3.  Pt says she is supposed to get Pomalyst delivered today and she will start it tomorrow.  4.  Pt had a lot of questions about what type of activity/ exercise is safe for her back?  She is worried about getting weaker and wants to exercise but doesn't want to injure her spine any further.   Dr. Alvy Bimler placed order for PHT.  Pt agreed.  Notified Edwinna Areola, RN w/ [facility] of new order for PHT.  5.  Pt says she continues to take Dexamethasone once daily and asks if she should also take the 5 tablets once a week?   Instructed pt per Dr. Alvy Bimler to only take one pill a day and do not take the weekly dose as she was doing before.  Pt verbalized understanding.

## 2016-02-14 ENCOUNTER — Telehealth: Payer: Self-pay | Admitting: *Deleted

## 2016-02-14 NOTE — Telephone Encounter (Signed)
Agreed -

## 2016-02-14 NOTE — Telephone Encounter (Signed)
Pt has question about her treatment plan.   She is scheduled for Cycle 8 of Daratumumab next Thursday 8/17,  Then sees Dr. Alvy Bimler the following Thursday 8/24 and also scheduled for Dara again on 8/24, which would be cycle 9.  Pt thought Dr. Alvy Bimler told her she was only going to get 8 cycles of Dara?  Informed pt that it is given weekly for the first 8 cycles and then it can be given less often after that, like every 2 weeks.  Will check w/ Dr. Alvy Bimler and let pt know when she comes in next week.  Pt agreed. She says she did start Pomalyst yesterday afternoon 8/10.

## 2016-02-18 ENCOUNTER — Other Ambulatory Visit: Payer: Self-pay | Admitting: *Deleted

## 2016-02-18 ENCOUNTER — Telehealth: Payer: Self-pay | Admitting: *Deleted

## 2016-02-18 DIAGNOSIS — C9001 Multiple myeloma in remission: Secondary | ICD-10-CM

## 2016-02-18 MED ORDER — DEXAMETHASONE 4 MG PO TABS: ORAL_TABLET | ORAL | 2 refills | Status: DC

## 2016-02-18 NOTE — Telephone Encounter (Signed)
Pt called to see what is causing weight loss. Dr Alvy Bimler states she should be taking 8 mg daily. Pt states she is taking it daily, will check dose.

## 2016-02-20 ENCOUNTER — Telehealth: Payer: Self-pay | Admitting: *Deleted

## 2016-02-20 ENCOUNTER — Ambulatory Visit (HOSPITAL_BASED_OUTPATIENT_CLINIC_OR_DEPARTMENT_OTHER): Payer: Medicare Other

## 2016-02-20 ENCOUNTER — Other Ambulatory Visit: Payer: Self-pay | Admitting: Hematology and Oncology

## 2016-02-20 ENCOUNTER — Ambulatory Visit: Payer: Medicare Other

## 2016-02-20 ENCOUNTER — Other Ambulatory Visit (HOSPITAL_BASED_OUTPATIENT_CLINIC_OR_DEPARTMENT_OTHER): Payer: Medicare Other

## 2016-02-20 VITALS — BP 146/54 | HR 65 | Temp 97.7°F | Resp 18

## 2016-02-20 DIAGNOSIS — C9002 Multiple myeloma in relapse: Secondary | ICD-10-CM

## 2016-02-20 DIAGNOSIS — C9001 Multiple myeloma in remission: Secondary | ICD-10-CM

## 2016-02-20 DIAGNOSIS — Z5112 Encounter for antineoplastic immunotherapy: Secondary | ICD-10-CM | POA: Diagnosis present

## 2016-02-20 LAB — COMPREHENSIVE METABOLIC PANEL
ALBUMIN: 2.9 g/dL — AB (ref 3.5–5.0)
ALT: 23 U/L (ref 0–55)
AST: 24 U/L (ref 5–34)
Alkaline Phosphatase: 82 U/L (ref 40–150)
Anion Gap: 7 mEq/L (ref 3–11)
BILIRUBIN TOTAL: 0.45 mg/dL (ref 0.20–1.20)
BUN: 25.5 mg/dL (ref 7.0–26.0)
CALCIUM: 8.7 mg/dL (ref 8.4–10.4)
CHLORIDE: 99 meq/L (ref 98–109)
CO2: 26 mEq/L (ref 22–29)
CREATININE: 0.7 mg/dL (ref 0.6–1.1)
EGFR: 77 mL/min/{1.73_m2} — AB (ref >90)
Glucose: 94 mg/dl (ref 70–140)
Potassium: 4.2 mEq/L (ref 3.5–5.1)
Sodium: 132 mEq/L — ABNORMAL LOW (ref 136–145)
Total Protein: 7.4 g/dL (ref 6.4–8.3)

## 2016-02-20 LAB — CBC WITH DIFFERENTIAL/PLATELET
BASO%: 0.1 % (ref 0.0–2.0)
BASOS ABS: 0 10*3/uL (ref 0.0–0.1)
EOS ABS: 0 10*3/uL (ref 0.0–0.5)
EOS%: 0.1 % (ref 0.0–7.0)
HEMATOCRIT: 25.7 % — AB (ref 34.8–46.6)
HEMOGLOBIN: 8.6 g/dL — AB (ref 11.6–15.9)
LYMPH#: 0.4 10*3/uL — AB (ref 0.9–3.3)
LYMPH%: 9 % — ABNORMAL LOW (ref 14.0–49.7)
MCH: 36 pg — AB (ref 25.1–34.0)
MCHC: 33.6 g/dL (ref 31.5–36.0)
MCV: 106.9 fL — AB (ref 79.5–101.0)
MONO#: 0.1 10*3/uL (ref 0.1–0.9)
MONO%: 2 % (ref 0.0–14.0)
NEUT#: 3.9 10*3/uL (ref 1.5–6.5)
NEUT%: 88.8 % — ABNORMAL HIGH (ref 38.4–76.8)
PLATELETS: 151 10*3/uL (ref 145–400)
RBC: 2.4 10*6/uL — ABNORMAL LOW (ref 3.70–5.45)
RDW: 18.1 % — AB (ref 11.2–14.5)
WBC: 4.4 10*3/uL (ref 3.9–10.3)

## 2016-02-20 MED ORDER — SODIUM CHLORIDE 0.9% FLUSH
10.0000 mL | INTRAVENOUS | Status: DC | PRN
  Administered 2016-02-20: 10 mL
  Filled 2016-02-20: qty 10

## 2016-02-20 MED ORDER — ACETAMINOPHEN 325 MG PO TABS
ORAL_TABLET | ORAL | Status: AC
  Filled 2016-02-20: qty 2

## 2016-02-20 MED ORDER — DIPHENHYDRAMINE HCL 25 MG PO CAPS
50.0000 mg | ORAL_CAPSULE | Freq: Once | ORAL | Status: AC
Start: 2016-02-20 — End: 2016-02-20
  Administered 2016-02-20: 50 mg via ORAL

## 2016-02-20 MED ORDER — ACETAMINOPHEN 325 MG PO TABS
650.0000 mg | ORAL_TABLET | Freq: Once | ORAL | Status: AC
  Administered 2016-02-20: 650 mg via ORAL

## 2016-02-20 MED ORDER — SODIUM CHLORIDE 0.9 % IV SOLN
Freq: Once | INTRAVENOUS | Status: AC
  Administered 2016-02-20: 10:00:00 via INTRAVENOUS

## 2016-02-20 MED ORDER — METHYLPREDNISOLONE SODIUM SUCC 125 MG IJ SOLR
INTRAMUSCULAR | Status: AC
  Filled 2016-02-20: qty 2

## 2016-02-20 MED ORDER — OXYCODONE HCL 20 MG PO TABS: 20.0000 mg | ORAL_TABLET | ORAL | 0 refills | Status: DC | PRN

## 2016-02-20 MED ORDER — HEPARIN SOD (PORK) LOCK FLUSH 100 UNIT/ML IV SOLN
500.0000 [IU] | Freq: Once | INTRAVENOUS | Status: AC | PRN
  Administered 2016-02-20: 500 [IU]
  Filled 2016-02-20: qty 5

## 2016-02-20 MED ORDER — METHYLPREDNISOLONE SODIUM SUCC 125 MG IJ SOLR
125.0000 mg | Freq: Once | INTRAMUSCULAR | Status: AC
  Administered 2016-02-20: 125 mg via INTRAVENOUS

## 2016-02-20 MED ORDER — PROCHLORPERAZINE MALEATE 10 MG PO TABS
10.0000 mg | ORAL_TABLET | Freq: Once | ORAL | Status: AC
  Administered 2016-02-20: 10 mg via ORAL

## 2016-02-20 MED ORDER — SODIUM CHLORIDE 0.9 % IV SOLN
15.4000 mg/kg | Freq: Once | INTRAVENOUS | Status: AC
  Administered 2016-02-20: 800 mg via INTRAVENOUS
  Filled 2016-02-20: qty 40

## 2016-02-20 MED ORDER — PROCHLORPERAZINE MALEATE 10 MG PO TABS
ORAL_TABLET | ORAL | Status: AC
  Filled 2016-02-20: qty 1

## 2016-02-20 MED ORDER — DIPHENHYDRAMINE HCL 25 MG PO CAPS
ORAL_CAPSULE | ORAL | Status: AC
  Filled 2016-02-20: qty 2

## 2016-02-20 MED ORDER — HYDROMORPHONE HCL 4 MG/ML IJ SOLN
2.0000 mg | INTRAMUSCULAR | Status: DC | PRN
  Administered 2016-02-20: 2 mg via INTRAVENOUS

## 2016-02-20 MED ORDER — SODIUM CHLORIDE 0.9 % IJ SOLN
10.0000 mL | INTRAMUSCULAR | Status: AC | PRN
  Administered 2016-02-20: 10 mL
  Filled 2016-02-20: qty 10

## 2016-02-20 MED ORDER — HYDROMORPHONE HCL 4 MG/ML IJ SOLN
INTRAMUSCULAR | Status: AC
  Filled 2016-02-20: qty 1

## 2016-02-20 NOTE — Patient Instructions (Signed)
[facility] Discharge Instructions for Patients Receiving Chemotherapy  Today you received the following chemotherapy agents Darzalex.  To help prevent nausea and vomiting after your treatment, we encourage you to take your nausea medication as directed.  If you develop nausea and vomiting that is not controlled by your nausea medication, call the clinic.   BELOW ARE SYMPTOMS THAT SHOULD BE REPORTED IMMEDIATELY:  *FEVER GREATER THAN 100.5 F  *CHILLS WITH OR WITHOUT FEVER  NAUSEA AND VOMITING THAT IS NOT CONTROLLED WITH YOUR NAUSEA MEDICATION  *UNUSUAL SHORTNESS OF BREATH  *UNUSUAL BRUISING OR BLEEDING  TENDERNESS IN MOUTH AND THROAT WITH OR WITHOUT PRESENCE OF ULCERS  *URINARY PROBLEMS  *BOWEL PROBLEMS  UNUSUAL RASH Items with * indicate a potential emergency and should be followed up as soon as possible.  Feel free to call the clinic you have any questions or concerns. The clinic phone number is [phone].  Please show the CHEMO ALERT CARD at check-in to the Emergency Department and triage nurse.    

## 2016-02-20 NOTE — Patient Instructions (Signed)

## 2016-02-20 NOTE — Telephone Encounter (Addendum)
Spoke with pt in Infusion room.  She says she isn't sure she wants treatment today as she is feeling very fatigued.  Discussed that today is cycle #8 of Dara and then she goes to every 2 weeks.  Although the every 2 week Treatment plan is scheduled to start next week,  Dr. Alvy Bimler did mention last week she could start the following week if she wants.  Pt says if she can wait 2 weeks for next treatment, she will be ok getting cycle #8 today.   Request sent to Scheduling to move appts from 8/24 to 8/31 MD appt at 9:45 am.  Pt and daughter verbalized understanding.  Rx for Oxycodone refill given to pt today. Discussed pain management;  Pt says she woke up w/ increased pain in lower back this morning which she attributed to PHT yesterday.  But she also did not take her Morphine last night due to "I didn't have pain."   Reinforced to Pt to take Extended Release MS every 12 hrs regardless of pain level, especially at bedtime.  Continue Oxycodone as needed but very important to keep MS twice daily so pain meds don't "wear off" by morning.  She verbalized understanding.

## 2016-02-24 ENCOUNTER — Telehealth: Payer: Self-pay | Admitting: *Deleted

## 2016-02-24 NOTE — Telephone Encounter (Signed)
Pt states she "feels terrible, weak, dizzy and SOB". Forgot to take Pomalyst on Saturday- felt better on Sunday. Is having tremors, she believes is from the decadron. Has gained 2 lbs, appetite was better for a few days after increasing dose, but she is back to no appetite. She is taking MS and Oxycodone as directed.

## 2016-02-25 ENCOUNTER — Telehealth: Payer: Self-pay | Admitting: *Deleted

## 2016-02-25 ENCOUNTER — Other Ambulatory Visit: Payer: Self-pay | Admitting: Hematology and Oncology

## 2016-02-25 ENCOUNTER — Other Ambulatory Visit: Payer: Self-pay | Admitting: Cardiovascular Disease

## 2016-02-25 ENCOUNTER — Other Ambulatory Visit (HOSPITAL_COMMUNITY): Payer: Self-pay | Admitting: Interventional Radiology

## 2016-02-25 DIAGNOSIS — IMO0002 Reserved for concepts with insufficient information to code with codable children: Secondary | ICD-10-CM

## 2016-02-25 NOTE — Telephone Encounter (Signed)
"  I'm Leeroy Cha calling for my mom.  I am her P.O.A.  We need to know when she is scheduled to come in again.  The treatment is to change and she'll have a week off.  We believe Thursday she comes for lab only just need to know what time."  Will notify provider for clarification.  Treatment scheduled 02-27-2016 at 9:30 am.  No lab, no F/U on this Thurs.  Belenda Cruise can be reached at [phone].  Or can call patient with appointment/treatment information.

## 2016-02-25 NOTE — Telephone Encounter (Signed)
Clarification: She does not need to return on Thursday Tammi, please call Ubaldo Glassing to make sure that appointment is cancelled This week is her week break

## 2016-02-25 NOTE — Telephone Encounter (Signed)
She can hold off Pomalyst for now. Please add labs appt before Rx this Thursday

## 2016-02-25 NOTE — Telephone Encounter (Signed)
Collaborative nurse reports she has called patient and her daughter.

## 2016-02-25 NOTE — Telephone Encounter (Signed)
Pt instructed to hold Pomalyst for now. To have labs drawn on Thursday prior to treatment. Verbalized understanding. Message sent to scheduler

## 2016-02-27 ENCOUNTER — Ambulatory Visit: Payer: Medicare Other

## 2016-02-27 ENCOUNTER — Ambulatory Visit: Payer: Medicare Other | Admitting: Hematology and Oncology

## 2016-02-27 ENCOUNTER — Other Ambulatory Visit: Payer: Medicare Other

## 2016-03-02 ENCOUNTER — Telehealth: Payer: Self-pay | Admitting: *Deleted

## 2016-03-02 ENCOUNTER — Other Ambulatory Visit: Payer: Self-pay | Admitting: Radiology

## 2016-03-02 NOTE — Telephone Encounter (Signed)
Instructed pt to hold Pomalyst until she sees her again on 9/7.   Pt verbalized understanding.

## 2016-03-02 NOTE — Telephone Encounter (Signed)
Pt asks if she can talk to Dr. Alvy Bimler about changing treatment from [facility] to anything else? States the Pomalyst has "knocked me off my feet." She c/o feeling "really bad" and so weak she can hardly walk.  Says she feels worse than when she was on the Revlimid.  She has 7 pills left in her bottle.

## 2016-03-02 NOTE — Telephone Encounter (Signed)
She may stop pomalyst until our next visit

## 2016-03-03 ENCOUNTER — Encounter (HOSPITAL_COMMUNITY): Payer: Self-pay

## 2016-03-03 ENCOUNTER — Telehealth: Payer: Self-pay | Admitting: *Deleted

## 2016-03-03 ENCOUNTER — Ambulatory Visit (HOSPITAL_COMMUNITY)
Admission: RE | Admit: 2016-03-03 | Discharge: 2016-03-03 | Disposition: A | Discharge status: Home / Self Care | Payer: Medicare Other | Source: Ambulatory Visit | Attending: Interventional Radiology | Admitting: Interventional Radiology

## 2016-03-03 DIAGNOSIS — Z8249 Family history of ischemic heart disease and other diseases of the circulatory system: Secondary | ICD-10-CM | POA: Insufficient documentation

## 2016-03-03 DIAGNOSIS — I10 Essential (primary) hypertension: Secondary | ICD-10-CM | POA: Insufficient documentation

## 2016-03-03 DIAGNOSIS — I351 Nonrheumatic aortic (valve) insufficiency: Secondary | ICD-10-CM | POA: Diagnosis not present

## 2016-03-03 DIAGNOSIS — Z853 Personal history of malignant neoplasm of breast: Secondary | ICD-10-CM | POA: Insufficient documentation

## 2016-03-03 DIAGNOSIS — F329 Major depressive disorder, single episode, unspecified: Secondary | ICD-10-CM | POA: Insufficient documentation

## 2016-03-03 DIAGNOSIS — M4854XA Collapsed vertebra, not elsewhere classified, thoracic region, initial encounter for fracture: Secondary | ICD-10-CM | POA: Insufficient documentation

## 2016-03-03 DIAGNOSIS — I2510 Atherosclerotic heart disease of native coronary artery without angina pectoris: Secondary | ICD-10-CM | POA: Diagnosis not present

## 2016-03-03 DIAGNOSIS — E039 Hypothyroidism, unspecified: Secondary | ICD-10-CM | POA: Diagnosis not present

## 2016-03-03 DIAGNOSIS — Z5309 Procedure and treatment not carried out because of other contraindication: Secondary | ICD-10-CM | POA: Insufficient documentation

## 2016-03-03 DIAGNOSIS — G629 Polyneuropathy, unspecified: Secondary | ICD-10-CM | POA: Insufficient documentation

## 2016-03-03 DIAGNOSIS — C9000 Multiple myeloma not having achieved remission: Secondary | ICD-10-CM | POA: Diagnosis not present

## 2016-03-03 DIAGNOSIS — IMO0002 Reserved for concepts with insufficient information to code with codable children: Secondary | ICD-10-CM

## 2016-03-03 DIAGNOSIS — Z7982 Long term (current) use of aspirin: Secondary | ICD-10-CM | POA: Insufficient documentation

## 2016-03-03 DIAGNOSIS — R1310 Dysphagia, unspecified: Secondary | ICD-10-CM | POA: Insufficient documentation

## 2016-03-03 DIAGNOSIS — Z9221 Personal history of antineoplastic chemotherapy: Secondary | ICD-10-CM | POA: Diagnosis not present

## 2016-03-03 DIAGNOSIS — Z823 Family history of stroke: Secondary | ICD-10-CM | POA: Insufficient documentation

## 2016-03-03 LAB — BASIC METABOLIC PANEL
ANION GAP: 6 (ref 5–15)
BUN: 17 mg/dL (ref 6–20)
CALCIUM: 8.7 mg/dL — AB (ref 8.9–10.3)
CO2: 28 mmol/L (ref 22–32)
Chloride: 97 mmol/L — ABNORMAL LOW (ref 101–111)
Creatinine, Ser: 0.68 mg/dL (ref 0.44–1.00)
GLUCOSE: 95 mg/dL (ref 65–99)
Potassium: 4.3 mmol/L (ref 3.5–5.1)
Sodium: 131 mmol/L — ABNORMAL LOW (ref 135–145)

## 2016-03-03 LAB — CBC WITH DIFFERENTIAL/PLATELET
BASOS ABS: 0 10*3/uL (ref 0.0–0.1)
Basophils Relative: 1 %
EOS ABS: 0 10*3/uL (ref 0.0–0.7)
Eosinophils Relative: 2 %
HCT: 28 % — ABNORMAL LOW (ref 36.0–46.0)
HEMOGLOBIN: 9.3 g/dL — AB (ref 12.0–15.0)
LYMPHS ABS: 0.4 10*3/uL — AB (ref 0.7–4.0)
LYMPHS PCT: 37 %
MCH: 35.4 pg — ABNORMAL HIGH (ref 26.0–34.0)
MCHC: 33.2 g/dL (ref 30.0–36.0)
MCV: 106.5 fL — ABNORMAL HIGH (ref 78.0–100.0)
Monocytes Absolute: 0.1 10*3/uL (ref 0.1–1.0)
Monocytes Relative: 7 %
NEUTROS ABS: 0.5 10*3/uL — AB (ref 1.7–7.7)
Neutrophils Relative %: 53 %
PLATELETS: 183 10*3/uL (ref 150–400)
RBC: 2.63 MIL/uL — ABNORMAL LOW (ref 3.87–5.11)
RDW: 17.2 % — AB (ref 11.5–15.5)
WBC: 1 10*3/uL — CL (ref 4.0–10.5)

## 2016-03-03 LAB — PROTIME-INR
INR: 1.05
PROTHROMBIN TIME: 13.8 s (ref 11.4–15.2)

## 2016-03-03 MED ORDER — CEFAZOLIN SODIUM-DEXTROSE 2-4 GM/100ML-% IV SOLN: 2.0000 g | INTRAVENOUS | Status: DC

## 2016-03-03 MED ORDER — SODIUM CHLORIDE 0.9 % IV SOLN
INTRAVENOUS | Status: DC
  Administered 2016-03-03: 11:00:00 via INTRAVENOUS

## 2016-03-03 NOTE — Progress Notes (Signed)
Pam Allred, PA notified of critical WBC value of 1.0 per lab.

## 2016-03-03 NOTE — Progress Notes (Signed)
Pt was seen by Dr Estanislado Pandy today Andrey Campanile) at RN station; holding for KP procedure. Pt had too low WBC count to proceed today. Pt will reschedule  With jennifer and have labs redrawn soon. D/C with family to home.Pt is presently receiving chemo tx.

## 2016-03-03 NOTE — H&P (Signed)
Chief Complaint: Patient was seen in consultation today for Thoracic 10 and 11 vertebroplasty/kyphoplasty at the request of Dr Heath Lark  Referring Physician(s): Dr Heath Lark  Supervising Physician: Luanne Bras  Patient Status: Outpatient  History of Present Illness: Natasha Jackson is a 82 y.o. female   Hx Multiple Myeloma New back pain x few weeks Hx T12 KP 07/2014; T9 KP 11/2015 Procedures did relieve pain Now scheduled for Thoracic 10 and 11 compression fracture Vertebroplasty/kyphoplasty MM relapse/recurrence  Past Medical History:  Diagnosis Date  . Aortic regurgitation   . Breast cancer ([place])   . Coronary artery disease   . DCIS (ductal carcinoma in situ) 08/04/2012  . Depression 09/05/2014  . Dysphagia 01/23/2016  . Hot flash not due to menopause 12/23/2011  . HTN (hypertension)   . Hypothyroidism 10/18/2014  . Multiple myeloma   . Multiple myeloma ([place]) 10/11/2015  . Neuropathy ([place]) 12/23/2011  . Spinal fracture of T12 vertebra ([facility])    vertebral fx    Past Surgical History:  Procedure Laterality Date  . INCISIONAL HERNIA REPAIR    . lymph node excision - left groin      Allergies: Review of patient's allergies indicates no known allergies.  Medications: Prior to Admission medications   Medication Sig Start Date End Date Taking? Authorizing Provider  aspirin 325 MG EC tablet Take 325 mg by mouth daily.   Yes Historical Provider, MD  cholecalciferol (VITAMIN D) 1000 UNITS tablet Take 1,000 Units by mouth daily. 05/01/13  Yes Heath Lark, MD  dexamethasone (DECADRON) 4 MG tablet Take 2 tablets daily (8 mg) Patient taking differently: Take 8 mg by mouth daily.  02/18/16  Yes Heath Lark, MD  gabapentin (NEURONTIN) 300 MG capsule TAKE ONE CAPSULE BY MOUTH THREE TIMES DAILY Patient taking differently: Take 300 mg by mouth 3 times daily 12/03/15  Yes Heath Lark, MD  levothyroxine (SYNTHROID, LEVOTHROID) 50 MCG tablet Take 50 mcg by mouth daily before  breakfast.   Yes Historical Provider, MD  mirtazapine (REMERON) 7.5 MG tablet TAKE ONE TABLET BY MOUTH NIGHTLY AT BEDTIME Patient taking differently: Take 7.5 mg by mouth at bedtime 01/13/16  Yes Heath Lark, MD  morphine (MS CONTIN) 30 MG 12 hr tablet Take 1 tablet (30 mg total) by mouth every 12 (twelve) hours. 02/06/16  Yes Heath Lark, MD  Multiple Vitamin (MULTIVITAMIN WITH MINERALS) TABS tablet Take 1 tablet by mouth daily.   Yes Historical Provider, MD  Oxycodone HCl 20 MG TABS Take 1 tablet (20 mg total) by mouth every 4 (four) hours as needed. 02/20/16  Yes Heath Lark, MD  Polyvinyl Alcohol-Povidone (REFRESH OP) Place 2 drops into both eyes 2 (two) times daily as needed (dry eyes).   Yes Historical Provider, MD  pomalidomide (POMALYST) 3 MG capsule Take 1 capsule (3 mg total) by mouth daily. Take with water on days 1-21. Repeat every 28 days. 02/07/16  Yes Heath Lark, MD  traMADol (ULTRAM) 50 MG tablet Take 2 tablets (100 mg total) by mouth every 6 (six) hours as needed for moderate pain (one to two tablets every 6 hrs as needed.). 01/27/16  Yes Heath Lark, MD  valsartan (DIOVAN) 160 MG tablet Take 1 tablet (160 mg total) by mouth daily. 10/23/15  Yes Eileen Stanford, PA-C  valsartan (DIOVAN) 160 MG tablet Take 1 tablet (160 mg total) by mouth daily. Please call and schedule a 3 month follow up with Dr Johnsie Cancel per last office visit 02/26/16  Yes Collier Salina  Tommy Rainwater, MD  acyclovir (ZOVIRAX) 400 MG tablet Take 1 tablet (400 mg total) by mouth daily. Patient not taking: Reported on 02/25/2016 12/24/15   Heath Lark, MD  cyclobenzaprine (FLEXERIL) 5 MG tablet Take 1 tablet (5 mg total) by mouth 3 (three) times daily as needed for muscle spasms. Patient not taking: Reported on 02/06/2016 12/16/15   Heath Lark, MD  diphenhydramine-acetaminophen (TYLENOL PM) 25-500 MG TABS Take 1 tablet by mouth at bedtime as needed (Sleep). Reported on 01/09/2016    Historical Provider, MD  loperamide (IMODIUM A-D) 2 MG tablet Take 2  mg by mouth as needed for diarrhea or loose stools (as directed.). Reported on 01/09/2016    Historical Provider, MD  omeprazole (PRILOSEC) 20 MG capsule Take 1 capsule (20 mg total) by mouth daily. 01/09/16   Heath Lark, MD  ondansetron (ZOFRAN) 8 MG tablet Take 1 tablet (8 mg total) by mouth every 8 (eight) hours as needed for nausea or vomiting. 01/02/16   Heath Lark, MD  prochlorperazine (COMPAZINE) 10 MG tablet Take 1 tablet (10 mg total) by mouth every 6 (six) hours as needed for nausea or vomiting. Patient not taking: Reported on 01/09/2016 01/02/16   Heath Lark, MD     Family History  Problem Relation Age of Onset  . Stroke Father   . Heart failure Mother     Social History   Social History  . Marital status: Married    Spouse name: N/A  . Number of children: N/A  . Years of education: N/A   Social History Main Topics  . Smoking status: Never Smoker  . Smokeless tobacco: Never Used  . Alcohol use No  . Drug use: No  . Sexual activity: No   Other Topics Concern  . None   Social History Narrative  . None     Review of Systems: A 12 point ROS discussed and pertinent positives are indicated in the HPI above.  All other systems are negative.  Review of Systems  Constitutional: Positive for activity change, appetite change and fatigue. Negative for fever.  Respiratory: Negative for shortness of breath.   Cardiovascular: Negative for chest pain.  Gastrointestinal: Negative for nausea.  Musculoskeletal: Positive for back pain and gait problem.  Neurological: Positive for weakness.  Psychiatric/Behavioral: Negative for behavioral problems and confusion.    Vital Signs: BP (!) 115/54   Pulse 67   Temp 98.8 F (37.1 C) (Oral)   Resp 16   Ht '5\' 3"'$  (1.6 m)   Wt 107 lb (48.5 kg)   SpO2 95%   BMI 18.95 kg/m   Physical Exam  Constitutional: She is oriented to person, place, and time.  Cardiovascular:  Irreg rate  Pulmonary/Chest: Effort normal and breath sounds normal.   Abdominal: Bowel sounds are normal.  Musculoskeletal: Normal range of motion.  Painful mid back  Neurological: She is alert and oriented to person, place, and time.  Skin: Skin is warm and dry.  Psychiatric: She has a normal mood and affect. Her behavior is normal. Judgment and thought content normal.  Nursing note and vitals reviewed.   Mallampati Score:  MD Evaluation Airway: WNL Heart: WNL Abdomen: WNL Chest/ Lungs: WNL ASA  Classification: 3 Mallampati/Airway Score: One  Imaging: Dg Op Swallowing Func-medicare/speech Path  Result Date: 02/12/2016 CLINICAL DATA:  Dysphagia.  Multiple myeloma. EXAM: MODIFIED BARIUM SWALLOW TECHNIQUE: Different consistencies of barium were administered orally to the patient by the Speech Pathologist. Imaging of the pharynx was performed in the  lateral projection. FLUOROSCOPY TIME:  Fluoroscopy Time:  2 minutes and 42 seconds COMPARISON:  None. FINDINGS: Thin liquid- post swallow retention. Premature spill. Post swallow minimal penetration. Pure- within normal limits Pure with cracker- minimal post swallow retention. Barium tablet -  passed normally. IMPRESSION: Mild swallow dysfunction, as above. Please refer to the Speech Pathologists report for complete details and recommendations. Electronically Signed   By: Abigail Miyamoto M.D.   On: 02/12/2016 15:09    Labs:  CBC:  Recent Labs  01/30/16 0829 02/06/16 0825 02/13/16 0825 02/20/16 0839  WBC 3.0* 3.7* 3.4* 4.4  HGB 9.0* 9.1* 8.5* 8.6*  HCT 26.3* 26.8* 25.2* 25.7*  PLT 134* 118* 155 151    COAGS:  Recent Labs  05/09/15 1210 11/05/15 0700 12/27/15 1130 03/03/16 1020  INR 0.96 1.01 0.96 1.05  APTT  --  27  --   --     BMP:  Recent Labs  11/05/15 0700  02/06/16 0825 02/13/16 0825 02/20/16 0839 03/03/16 1020  NA 136  < > 135* 133* 132* 131*  K 3.9  < > 4.2 4.0 4.2 4.3  CL 100*  --   --   --   --  97*  CO2 27  < > _0 GLUCOSE 78  < > 96 95 94 95  BUN 19  < >  19.1 18.9 25.5 17  CALCIUM 9.6  < > 8.7 8.6 8.7 8.7*  CREATININE 0.88  < > 0.7 0.7 0.7 0.68  GFRNONAA 57*  --   --   --   --  >60  GFRAA >60  --   --   --   --  >60  < > = values in this interval not displayed.  LIVER FUNCTION TESTS:  Recent Labs  01/30/16 0829 02/06/16 0825 02/13/16 0825 02/20/16 0839  BILITOT 0.39 0.56 0.46 0.45  AST 23 32 19 24  ALT _1 ALKPHOS 86 91 72 82  PROT 8.2 8.3 7.7 7.4  ALBUMIN 3.2* 3.3* 3.1* 2.9*    TUMOR MARKERS: No results for input(s): AFPTM, CEA, CA199, CHROMGRNA in the last 8760 hours.  Assessment and Plan:  Hx Multiple Myeloma Hx T9 and T 12 KP Now with new painful compression fractures at T10 and 11 Scheduled for Vertebroplasty/kyphoplasty Risks and Benefits discussed with the patient including, but not limited to education regarding the natural healing process of compression fractures without intervention, bleeding, infection, cement migration which may cause spinal cord damage, paralysis, pulmonary embolism or even death. All of the patient's questions were answered, patient is agreeable to proceed. Consent signed and in chart.  Thank you for this interesting consult.  I greatly enjoyed meeting Natasha Jackson and look forward to participating in their care.  A copy of this report was sent to the requesting provider on this date.  Electronically Signed: Monia Sabal A 03/03/2016, 10:48 AM   I spent a total of  30 Minutes   in face to face in clinical consultation, greater than 50% of which was counseling/coordinating care for T10/11 VP/KP

## 2016-03-03 NOTE — Telephone Encounter (Signed)
"  Melinda with [facility] calling to determine if Dr. Alvy Bimler would like Bone Biopsy ordered today.  Patient scheduled for Kyphoplasty of T10, T11 this morning." Notified Dr. Alvy Bimler of this request.  Verbal order received and read back that"No she does not want Bone Biopsy.  Rip Harbour given this order at this time.

## 2016-03-03 NOTE — Telephone Encounter (Signed)
Pt called about her appts.  She got a call today that she is scheduled for this Thursday.  I canceled appts made for this Thursday.  That request was sent to scheduling several weeks ago and since then the plan had changed to just keep appts on 9/7 as scheduled.  Explained to pt and she verbalized understanding.  Will come on 9/7 as planned.  She says Dr. Estanislado Pandy was unable to do procedure on her back today due to low WBC.  He wants WBC to be above 4 before doing procedure according to pt..  Asked pt to discuss this w/ Dr. Alvy Bimler on next visit.  Also confirmed w/ pt she is holding her Pomalyst until she sees Dr. Alvy Bimler again.  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 She c/o having "tremors" and feeling very weak.  Instructed pt to continue to hold chemo pill and can discuss weakness and tremors w/ Dr. Alvy Bimler on 9/7.

## 2016-03-03 NOTE — Progress Notes (Signed)
Patient ID: [name], female   DOB: 12/14/1925, 82 y.o.   MRN: [id number] INR. Vertebral body augmentation postponed because of critically low WBC <1.0 today Most likely due to chemo. Increased risk of infection.Patient is scheduled to see medical oncologist on the Sept 7 .Procedure will be rescheduled as soon as  WBC starts recovering. Discussed with patient and daughters. Dr Arlean Hopping

## 2016-03-04 LAB — PATHOLOGIST SMEAR REVIEW

## 2016-03-05 ENCOUNTER — Other Ambulatory Visit: Payer: Medicare Other

## 2016-03-05 ENCOUNTER — Ambulatory Visit: Payer: Medicare Other | Admitting: Hematology and Oncology

## 2016-03-05 ENCOUNTER — Ambulatory Visit: Payer: Medicare Other

## 2016-03-06 ENCOUNTER — Other Ambulatory Visit: Payer: Self-pay

## 2016-03-10 ENCOUNTER — Other Ambulatory Visit: Payer: Self-pay | Admitting: *Deleted

## 2016-03-10 MED ORDER — OXYCODONE HCL 20 MG PO TABS: 20.0000 mg | ORAL_TABLET | ORAL | 0 refills | Status: DC | PRN

## 2016-03-10 MED ORDER — MORPHINE SULFATE ER 30 MG PO TBCR: 30.0000 mg | EXTENDED_RELEASE_TABLET | Freq: Two times a day (BID) | ORAL | 0 refills | Status: DC

## 2016-03-11 ENCOUNTER — Encounter: Payer: Self-pay | Admitting: Pharmacist

## 2016-03-11 NOTE — Progress Notes (Signed)
Received fax refill request from [facility] today for pts Pomalyst.  Noted: "Instructed pt to continue to hold chemo pill and can discuss weakness and tremors w/ Dr. Alvy Bimler on 9/7."  I provided the fax request to Dr. Calton Dach RN and will await receipt of that fax sheet back to the oral chemo clinic to submit back to [facility].  Kennith Center, Pharm.D., CPP 03/11/2016@10 :13 AM Oral Chemo Clinic

## 2016-03-12 ENCOUNTER — Other Ambulatory Visit (HOSPITAL_BASED_OUTPATIENT_CLINIC_OR_DEPARTMENT_OTHER): Payer: Medicare Other

## 2016-03-12 ENCOUNTER — Ambulatory Visit: Payer: Medicare Other

## 2016-03-12 ENCOUNTER — Ambulatory Visit (HOSPITAL_BASED_OUTPATIENT_CLINIC_OR_DEPARTMENT_OTHER): Payer: Medicare Other | Admitting: Hematology and Oncology

## 2016-03-12 ENCOUNTER — Other Ambulatory Visit: Payer: Self-pay | Admitting: *Deleted

## 2016-03-12 ENCOUNTER — Telehealth: Payer: Self-pay | Admitting: *Deleted

## 2016-03-12 ENCOUNTER — Encounter: Payer: Self-pay | Admitting: Hematology and Oncology

## 2016-03-12 ENCOUNTER — Ambulatory Visit (HOSPITAL_BASED_OUTPATIENT_CLINIC_OR_DEPARTMENT_OTHER): Payer: Medicare Other

## 2016-03-12 VITALS — BP 123/70 | HR 70 | Temp 98.8°F | Resp 18 | Wt 110.6 lb

## 2016-03-12 VITALS — BP 96/41 | HR 56 | Temp 98.0°F | Resp 16

## 2016-03-12 DIAGNOSIS — C9002 Multiple myeloma in relapse: Secondary | ICD-10-CM

## 2016-03-12 DIAGNOSIS — Z515 Encounter for palliative care: Secondary | ICD-10-CM

## 2016-03-12 DIAGNOSIS — D61810 Antineoplastic chemotherapy induced pancytopenia: Secondary | ICD-10-CM

## 2016-03-12 DIAGNOSIS — G8929 Other chronic pain: Secondary | ICD-10-CM

## 2016-03-12 DIAGNOSIS — T402X5A Adverse effect of other opioids, initial encounter: Secondary | ICD-10-CM

## 2016-03-12 DIAGNOSIS — Z5112 Encounter for antineoplastic immunotherapy: Secondary | ICD-10-CM

## 2016-03-12 DIAGNOSIS — K5903 Drug induced constipation: Secondary | ICD-10-CM | POA: Diagnosis not present

## 2016-03-12 DIAGNOSIS — R42 Dizziness and giddiness: Secondary | ICD-10-CM

## 2016-03-12 DIAGNOSIS — E440 Moderate protein-calorie malnutrition: Secondary | ICD-10-CM | POA: Diagnosis not present

## 2016-03-12 DIAGNOSIS — C9001 Multiple myeloma in remission: Secondary | ICD-10-CM

## 2016-03-12 DIAGNOSIS — M549 Dorsalgia, unspecified: Secondary | ICD-10-CM

## 2016-03-12 DIAGNOSIS — T451X5A Adverse effect of antineoplastic and immunosuppressive drugs, initial encounter: Secondary | ICD-10-CM

## 2016-03-12 LAB — CBC WITH DIFFERENTIAL/PLATELET
BASO%: 0.4 % (ref 0.0–2.0)
Basophils Absolute: 0 10*3/uL (ref 0.0–0.1)
EOS ABS: 0 10*3/uL (ref 0.0–0.5)
EOS%: 0.4 % (ref 0.0–7.0)
HCT: 25.9 % — ABNORMAL LOW (ref 34.8–46.6)
HEMOGLOBIN: 8.7 g/dL — AB (ref 11.6–15.9)
LYMPH#: 0.3 10*3/uL — AB (ref 0.9–3.3)
LYMPH%: 8.6 % — ABNORMAL LOW (ref 14.0–49.7)
MCH: 35.8 pg — ABNORMAL HIGH (ref 25.1–34.0)
MCHC: 33.4 g/dL (ref 31.5–36.0)
MCV: 107.2 fL — AB (ref 79.5–101.0)
MONO#: 0.2 10*3/uL (ref 0.1–0.9)
MONO%: 6.3 % (ref 0.0–14.0)
NEUT%: 84.3 % — ABNORMAL HIGH (ref 38.4–76.8)
NEUTROS ABS: 2.5 10*3/uL (ref 1.5–6.5)
Platelets: 189 10*3/uL (ref 145–400)
RBC: 2.42 10*6/uL — ABNORMAL LOW (ref 3.70–5.45)
RDW: 17.5 % — AB (ref 11.2–14.5)
WBC: 2.9 10*3/uL — AB (ref 3.9–10.3)

## 2016-03-12 LAB — COMPREHENSIVE METABOLIC PANEL
ALBUMIN: 2.8 g/dL — AB (ref 3.5–5.0)
ALK PHOS: 73 U/L (ref 40–150)
ALT: 19 U/L (ref 0–55)
AST: 19 U/L (ref 5–34)
Anion Gap: 8 mEq/L (ref 3–11)
BILIRUBIN TOTAL: 0.41 mg/dL (ref 0.20–1.20)
BUN: 18.3 mg/dL (ref 7.0–26.0)
CO2: 24 mEq/L (ref 22–29)
Calcium: 8.8 mg/dL (ref 8.4–10.4)
Chloride: 98 mEq/L (ref 98–109)
Creatinine: 0.7 mg/dL (ref 0.6–1.1)
EGFR: 79 mL/min/{1.73_m2} — AB (ref >90)
GLUCOSE: 87 mg/dL (ref 70–140)
Potassium: 4.6 mEq/L (ref 3.5–5.1)
SODIUM: 131 meq/L — AB (ref 136–145)
TOTAL PROTEIN: 6.8 g/dL (ref 6.4–8.3)

## 2016-03-12 MED ORDER — ACETAMINOPHEN 325 MG PO TABS
ORAL_TABLET | ORAL | Status: AC
  Filled 2016-03-12: qty 2

## 2016-03-12 MED ORDER — POMALIDOMIDE 2 MG PO CAPS: 2.0000 mg | ORAL_CAPSULE | Freq: Every day | ORAL | 0 refills | Status: DC

## 2016-03-12 MED ORDER — METHYLPREDNISOLONE SODIUM SUCC 125 MG IJ SOLR
INTRAMUSCULAR | Status: AC
  Filled 2016-03-12: qty 2

## 2016-03-12 MED ORDER — SODIUM CHLORIDE 0.9 % IV SOLN
Freq: Once | INTRAVENOUS | Status: AC
  Administered 2016-03-12: 12:00:00 via INTRAVENOUS

## 2016-03-12 MED ORDER — DIPHENHYDRAMINE HCL 25 MG PO CAPS
ORAL_CAPSULE | ORAL | Status: AC
  Filled 2016-03-12: qty 2

## 2016-03-12 MED ORDER — SODIUM CHLORIDE 0.9 % IJ SOLN
10.0000 mL | INTRAMUSCULAR | Status: AC | PRN
  Administered 2016-03-12: 10 mL
  Filled 2016-03-12: qty 10

## 2016-03-12 MED ORDER — OXYCODONE HCL 30 MG PO TABS: 30.0000 mg | ORAL_TABLET | ORAL | 0 refills | Status: DC | PRN

## 2016-03-12 MED ORDER — METHYLPREDNISOLONE SODIUM SUCC 125 MG IJ SOLR
125.0000 mg | Freq: Once | INTRAMUSCULAR | Status: AC
  Administered 2016-03-12: 125 mg via INTRAVENOUS

## 2016-03-12 MED ORDER — DIPHENHYDRAMINE HCL 25 MG PO CAPS
50.0000 mg | ORAL_CAPSULE | Freq: Once | ORAL | Status: AC
  Administered 2016-03-12: 50 mg via ORAL

## 2016-03-12 MED ORDER — HYDROMORPHONE HCL 4 MG/ML IJ SOLN
INTRAMUSCULAR | Status: AC
  Filled 2016-03-12: qty 1

## 2016-03-12 MED ORDER — PROCHLORPERAZINE MALEATE 10 MG PO TABS
ORAL_TABLET | ORAL | Status: AC
  Filled 2016-03-12: qty 1

## 2016-03-12 MED ORDER — SODIUM CHLORIDE 0.9 % IV SOLN
15.2000 mg/kg | Freq: Once | INTRAVENOUS | Status: AC
  Administered 2016-03-12: 800 mg via INTRAVENOUS
  Filled 2016-03-12: qty 40

## 2016-03-12 MED ORDER — ACETAMINOPHEN 325 MG PO TABS
650.0000 mg | ORAL_TABLET | Freq: Once | ORAL | Status: AC
  Administered 2016-03-12: 650 mg via ORAL

## 2016-03-12 MED ORDER — SODIUM CHLORIDE 0.9% FLUSH
10.0000 mL | INTRAVENOUS | Status: DC | PRN
  Administered 2016-03-12: 10 mL
  Filled 2016-03-12: qty 10

## 2016-03-12 MED ORDER — HEPARIN SOD (PORK) LOCK FLUSH 100 UNIT/ML IV SOLN
500.0000 [IU] | Freq: Once | INTRAVENOUS | Status: AC | PRN
  Administered 2016-03-12: 500 [IU]
  Filled 2016-03-12: qty 5

## 2016-03-12 MED ORDER — HYDROMORPHONE HCL 4 MG/ML IJ SOLN
2.0000 mg | Freq: Once | INTRAMUSCULAR | Status: AC
  Administered 2016-03-12: 2 mg via INTRAVENOUS

## 2016-03-12 MED ORDER — MORPHINE SULFATE ER 60 MG PO TBCR: 60.0000 mg | EXTENDED_RELEASE_TABLET | Freq: Two times a day (BID) | ORAL | 0 refills | Status: DC

## 2016-03-12 MED ORDER — PROCHLORPERAZINE MALEATE 10 MG PO TABS
10.0000 mg | ORAL_TABLET | Freq: Once | ORAL | Status: AC
  Administered 2016-03-12: 10 mg via ORAL

## 2016-03-12 NOTE — Assessment & Plan Note (Signed)
She has poor appetite, weight loss and evidence of protein calorie malnutrition. She is referred to see a dietitian. She also has significant history of depression that would also contributed due to anorexia. She is on Remeron I encouraged her to eat frequent small meals. I will continue her on low dose daily dexamethasone

## 2016-03-12 NOTE — Assessment & Plan Note (Signed)
She is not symptomatic. I will continue Daratumumab She does not need transfusion After she completed current prescription of Pomalyst, I plan to reduce the dose for future cycle

## 2016-03-12 NOTE — Assessment & Plan Note (Signed)
She complains of weakness and dizziness. Her blood pressure is borderline low. I recommend she reduce the dose of her blood pressure medication by half. I will reassess next cycle. If her blood pressure remained low, I would discontinue her blood pressure completely

## 2016-03-12 NOTE — Progress Notes (Signed)
[place] OFFICE PROGRESS NOTE  Patient Care Team: Thressa Sheller, MD as PCP - General (Internal Medicine) Thressa Sheller, MD (Internal Medicine) Renella Cunas, MD (Inactive) as Attending Physician (Cardiology) Heath Lark, MD as Consulting Physician (Hematology and Oncology) Luanne Bras, MD as Consulting Physician (Interventional Radiology)  SUMMARY OF ONCOLOGIC HISTORY: Oncology History   The     Multiple myeloma in relapse [facility])   11/12/2008 Imaging    Skeletal survey showed lytic lesion in the right femur compatible with  myeloma. There were Questionable skull lesions      11/15/2008 Bone Marrow Biopsy    BONE MARROW ASPIRATE AND BIOPSY: showed NORMOCELLULAR MARROW FOR AGE WITH PLASMA CELL DYSCRASIA  (PLASMA CELLS 25%). 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 Subsequently she was placed on Revlimid & Dexamethasone alone      05/12/2013 Bone Marrow Biopsy    Bone marrow biopsy showed persistent plasma cells. Blood work confirmed VGPR status      06/11/2014 Tumor Marker    Bloodwork show disease relapse      06/26/2014 Procedure    She has placement of port      07/05/2014 - 07/26/2014 Chemotherapy    She received Elotuzumab and Revlimid. Rx was discontinued with elotuzumab per patient preference      08/01/2014 - 08/05/2014 Hospital Admission    She was admitted to the hospital for kyphoplasty and pain management after traumatic compression fracture      08/23/2014 - 04/18/2015 Chemotherapy    She was placed on dexamethasone & Revlimid      04/11/2015 Tumor Marker    Blood work showed VGPR. Treatment was discontinued and she is maintained on Zometa only      10/20/2015 Imaging    MRI spine showed acute/subacute superior endplate compression fractures at T2 and T9 are incompletely healed.        11/05/2015 Procedure    Status post vertebral body augmentation using balloon kyphoplasty at T9 as described without event      12/27/2015 Procedure    She had port placement      01/02/2016 -  Chemotherapy    She received Daratumumab, Dexamethasone and Velcade      01/20/2016 Imaging    MRi spine showed acute to subacute pathologic fractures of T10 and T11. Up to 70% loss of vertebral body height at the former with mild retropulsion of bone contributing to mild spinal stenosis at the T10-T11 level.       INTERVAL HISTORY: Please see below for problem oriented charting. She returns today with her 2 daughters. Since the last time I saw her, she continues to complain of poorly controlled back pain. Her kyphoplasty appointment was delayed due to pancytopenia. When she called last week, I recommended holding off Pomalyst but the patient restarted her treatment recently. She denies recent infection. The patient denies any recent signs or symptoms of bleeding such as spontaneous epistaxis, hematuria or hematochezia. According to our discussion, she is taking an equivalent of 160 mg of morphine equivalent dose per day and her pain remained poorly controlled. She had occasional constipation. She denies recent nausea. She is wearing her brace consistently and is able to mobilize short distances, limited by fatigue and weakness. She complained of occasional dizziness Since I started her on dexamethasone, she complained of feeling jittery. She has not lost  weight  REVIEW OF SYSTEMS:   Constitutional: Denies fevers, chills or abnormal weight loss Eyes: Denies blurriness of vision Ears, nose, mouth, throat, and face: Denies mucositis or sore throat Respiratory: Denies cough, dyspnea or wheezes Cardiovascular: Denies palpitation, chest discomfort or lower extremity swelling Skin: Denies abnormal skin rashes Lymphatics: Denies new lymphadenopathy or easy bruising Behavioral/Psych: Mood  is stable, no new changes  All other systems were reviewed with the patient and are negative.  I have reviewed the past medical history, past surgical history, social history and family history with the patient and they are unchanged from previous note.  ALLERGIES:  has No Known Allergies.  MEDICATIONS:  Current Outpatient Prescriptions  Medication Sig Dispense Refill  . acyclovir (ZOVIRAX) 400 MG tablet Take 1 tablet (400 mg total) by mouth daily. 60 tablet 11  . aspirin 325 MG EC tablet Take 325 mg by mouth daily.    . cholecalciferol (VITAMIN D) 1000 UNITS tablet Take 1,000 Units by mouth daily.    . cyclobenzaprine (FLEXERIL) 5 MG tablet Take 1 tablet (5 mg total) by mouth 3 (three) times daily as needed for muscle spasms. 30 tablet 0  . dexamethasone (DECADRON) 4 MG tablet Take 2 tablets daily (8 mg) (Patient taking differently: Take 8 mg by mouth daily. ) 60 tablet 2  . gabapentin (NEURONTIN) 300 MG capsule TAKE ONE CAPSULE BY MOUTH THREE TIMES DAILY (Patient taking differently: Take 300 mg by mouth 3 times daily) 90 capsule 3  . levothyroxine (SYNTHROID, LEVOTHROID) 50 MCG tablet Take 50 mcg by mouth daily before breakfast.    . mirtazapine (REMERON) 7.5 MG tablet TAKE ONE TABLET BY MOUTH NIGHTLY AT BEDTIME (Patient taking differently: Take 7.5 mg by mouth at bedtime) 30 tablet 3  . morphine (MS CONTIN) 60 MG 12 hr tablet Take 1 tablet (60 mg total) by mouth every 12 (twelve) hours. 60 tablet 0  . Multiple Vitamin (MULTIVITAMIN WITH MINERALS) TABS tablet Take 1 tablet by mouth daily.    [name] omeprazole (PRILOSEC) 20 MG capsule Take 1 capsule (20 mg total) by mouth daily. 90 capsule 1  . oxyCODONE (ROXICODONE) 30 MG immediate release tablet Take 1 tablet (30 mg total) by mouth every 4 (four) hours as needed for severe pain. 90 tablet 0  . Oxycodone HCl 20 MG TABS Take 1 tablet (20 mg total) by mouth every 4 (four) hours as needed. 90 tablet 0  . Polyvinyl Alcohol-Povidone (REFRESH OP) Place  2 drops into both eyes 2 (two) times daily as needed (dry eyes).    . pomalidomide (POMALYST) 3 MG capsule Take 1 capsule (3 mg total) by mouth daily. Take with water on days 1-21. Repeat every 28 days. (Patient not taking: Reported on 03/12/2016) 21 capsule 9  . traMADol (ULTRAM) 50 MG tablet Take 2 tablets (100 mg total) by mouth every 6 (six) hours as needed for moderate pain (one to two tablets every 6 hrs as needed.). 90 tablet 0  . valsartan (DIOVAN) 160 MG tablet Take 1 tablet (160 mg total) by mouth daily. 30 tablet 3  . valsartan (DIOVAN) 160 MG tablet Take 1 tablet (160 mg total) by mouth daily. Please call and schedule a 3 month follow up with Dr Johnsie Cancel per last office visit 30 tablet 7   No current facility-administered medications for this visit.    Facility-Administered Medications Ordered in Other Visits  Medication Dose Route Frequency Provider Last Rate Last Dose  . heparin lock flush 100 unit/mL  500 Units  Intracatheter Once PRN Heath Lark, MD      . sodium chloride flush (NS) 0.9 % injection 10 mL  10 mL Intracatheter PRN Heath Lark, MD        PHYSICAL EXAMINATION: ECOG PERFORMANCE STATUS: 2 - Symptomatic, <50% confined to bed  Vitals:   03/12/16 1010  BP: 123/70  Pulse: 70  Resp: 18  Temp: 98.8 F (37.1 C)   Filed Weights   03/12/16 1010  Weight: 110 lb 9.6 oz (50.2 kg)    GENERAL:alert, no distress and comfortable. She looks thin and cachectic SKIN: skin color, texture, turgor are normal, no rashes or significant lesions EYES: normal, Conjunctiva are pale and non-injected, sclera clear Musculoskeletal:no cyanosis of digits and no clubbing  NEURO: alert & oriented x 3 with fluent speech, no focal motor/sensory deficits  LABORATORY DATA:  I have reviewed the data as listed    Component Value Date/Time   NA 131 (L) 03/12/2016 0939   K 4.6 03/12/2016 0939   CL 97 (L) 03/03/2016 1020   CL 103 11/25/2012 1328   CO2 24 03/12/2016 0939   GLUCOSE 87 03/12/2016  0939   GLUCOSE 91 11/25/2012 1328   BUN 18.3 03/12/2016 0939   CREATININE 0.7 03/12/2016 0939   CALCIUM 8.8 03/12/2016 0939   PROT 6.8 03/12/2016 0939   ALBUMIN 2.8 (L) 03/12/2016 0939   AST 19 03/12/2016 0939   ALT 19 03/12/2016 0939   ALKPHOS 73 03/12/2016 0939   BILITOT 0.41 03/12/2016 0939   GFRNONAA >60 03/03/2016 1020   GFRAA >60 03/03/2016 1020    No results found for: SPEP, UPEP  Lab Results  Component Value Date   WBC 2.9 (L) 03/12/2016   NEUTROABS 2.5 03/12/2016   HGB 8.7 (L) 03/12/2016   HCT 25.9 (L) 03/12/2016   MCV 107.2 (H) 03/12/2016   PLT 189 03/12/2016      Chemistry      Component Value Date/Time   NA 131 (L) 03/12/2016 0939   K 4.6 03/12/2016 0939   CL 97 (L) 03/03/2016 1020   CL 103 11/25/2012 1328   CO2 24 03/12/2016 0939   BUN 18.3 03/12/2016 0939   CREATININE 0.7 03/12/2016 0939      Component Value Date/Time   CALCIUM 8.8 03/12/2016 0939   ALKPHOS 73 03/12/2016 0939   AST 19 03/12/2016 0939   ALT 19 03/12/2016 0939   BILITOT 0.41 03/12/2016 0939      ASSESSMENT & PLAN:  Multiple myeloma in relapse ([place]) The patient has significant complaints with various side effects. She was started on Pomalidomide complicated by severe pancytopenia. She has resumed treatment today with 4 more pills left. I recommend she stop her treatment after she completes this cycle. For next cycle of treatment, I plan to refill it at a lower dose at 2 mg daily for 21 days, rest 7 days. She will continue Daratumumab every 2 weeks. I recommend she reschedule kyphoplasty for the week of 03/23/2016 I recommend she reduces the dose of dexamethasone to 4 mg daily with plan to further taper her down in the future.  Chronic back pain greater than 3 months duration She has multifactorial chronic back pain. Previously, the patient was reluctant to try narcotic prescription. Recently she is more compliant but still has poorly controlled pain. After reviewing her current  pain medication regimen, I estimated she needed around 160 mg morphine equivalent dose per day. I plan to further increase the dose of MS Contin to 60 mg twice  a day and to increase oxycodone immediate release breakthrough pain medicine to 30 mg every 4 hours as needed for breakthrough pain. I reiterated minded her the importance of wearing a brace for support and to reschedule her kyphoplasty appointment.  Pancytopenia due to antineoplastic chemotherapy [facility]) She is not symptomatic. I will continue Daratumumab She does not need transfusion After she completed current prescription of Pomalyst, I plan to reduce the dose for future cycle   Constipation due to opioid therapy She has chronic constipation and is not consistent taking a laxative. I reinforced the importance of taking her laxatives on the regular basis  Dizziness She complains of weakness and dizziness. Her blood pressure is borderline low. I recommend she reduce the dose of her blood pressure medication by half. I will reassess next cycle. If her blood pressure remained low, I would discontinue her blood pressure completely  Protein-calorie malnutrition, moderate (HCC) She has poor appetite, weight loss and evidence of protein calorie malnutrition. She is referred to see a dietitian. She also has significant history of depression that would also contributed due to anorexia. She is on Remeron I encouraged her to eat frequent small meals. I will continue her on low dose daily dexamethasone  Quality of life palliative care encounter I have extensive discussion with the patient and family members. Overall, she is slightly improved compared to prior visit, likely due to increase control of pain and her being started on dexamethasone.  She is quite deconditioned from recent compression fracture and weakness. I continue to encourage her graduated exercise as tolerated   Orders Placed This Encounter  Procedures  .  Kappa/lambda light chains    Standing Status:   Future    Standing Expiration Date:   04/16/2017  . Multiple Myeloma Panel (SPEP&IFE w/QIG)    Standing Status:   Future    Standing Expiration Date:   04/16/2017   All questions were answered. The patient knows to call the clinic with any problems, questions or concerns. No barriers to learning was detected. I spent 25 minutes counseling the patient face to face. The total time spent in the appointment was 40 minutes and more than 50% was on counseling and review of test results     [facility], [place], MD 03/12/2016 12:47 PM

## 2016-03-12 NOTE — Telephone Encounter (Signed)
-----   Message from Heath Lark, MD sent at 03/11/2016  6:07 PM EDT ----- Regarding: kyphoplasty Did she actually had kyphoplasty on 8/29? Just wondering

## 2016-03-12 NOTE — Assessment & Plan Note (Signed)
She has multifactorial chronic back pain. Previously, the patient was reluctant to try narcotic prescription. Recently she is more compliant but still has poorly controlled pain. After reviewing her current pain medication regimen, I estimated she needed around 160 mg morphine equivalent dose per day. I plan to further increase the dose of MS Contin to 60 mg twice a day and to increase oxycodone immediate release breakthrough pain medicine to 30 mg every 4 hours as needed for breakthrough pain. I reiterated minded her the importance of wearing a brace for support and to reschedule her kyphoplasty appointment.

## 2016-03-12 NOTE — Assessment & Plan Note (Signed)
The patient has significant complaints with various side effects. She was started on Pomalidomide complicated by severe pancytopenia. She has resumed treatment today with 4 more pills left. I recommend she stop her treatment after she completes this cycle. For next cycle of treatment, I plan to refill it at a lower dose at 2 mg daily for 21 days, rest 7 days. She will continue Daratumumab every 2 weeks. I recommend she reschedule kyphoplasty for the week of 03/23/2016 I recommend she reduces the dose of dexamethasone to 4 mg daily with plan to further taper her down in the future.

## 2016-03-12 NOTE — Assessment & Plan Note (Signed)
She has chronic constipation and is not consistent taking a laxative. I reinforced the importance of taking her laxatives on the regular basis

## 2016-03-12 NOTE — Patient Instructions (Signed)
[facility] Discharge Instructions for Patients Receiving Chemotherapy  Today you received the following chemotherapy agents :  Darzalex  To help prevent nausea and vomiting after your treatment, we encourage you to take your nausea medication as prescribed.   If you develop nausea and vomiting that is not controlled by your nausea medication, call the clinic.   BELOW ARE SYMPTOMS THAT SHOULD BE REPORTED IMMEDIATELY:  *FEVER GREATER THAN 100.5 F  *CHILLS WITH OR WITHOUT FEVER  NAUSEA AND VOMITING THAT IS NOT CONTROLLED WITH YOUR NAUSEA MEDICATION  *UNUSUAL SHORTNESS OF BREATH  *UNUSUAL BRUISING OR BLEEDING  TENDERNESS IN MOUTH AND THROAT WITH OR WITHOUT PRESENCE OF ULCERS  *URINARY PROBLEMS  *BOWEL PROBLEMS  UNUSUAL RASH Items with * indicate a potential emergency and should be followed up as soon as possible.  Feel free to call the clinic you have any questions or concerns. The clinic phone number is [phone].  Please show the CHEMO ALERT CARD at check-in to the Emergency Department and triage nurse.   

## 2016-03-12 NOTE — Patient Instructions (Signed)

## 2016-03-12 NOTE — Assessment & Plan Note (Signed)
I have extensive discussion with the patient and family members. Overall, she is slightly improved compared to prior visit, likely due to increase control of pain and her being started on dexamethasone.  She is quite deconditioned from recent compression fracture and weakness. I continue to encourage her graduated exercise as tolerated

## 2016-03-16 ENCOUNTER — Observation Stay (HOSPITAL_COMMUNITY)
Admission: EM | Admit: 2016-03-16 | Discharge: 2016-03-17 | Disposition: A | Discharge status: Home / Self Care | Payer: Medicare Other | Attending: Interventional Cardiology | Admitting: Interventional Cardiology

## 2016-03-16 ENCOUNTER — Encounter (HOSPITAL_COMMUNITY): Payer: Self-pay

## 2016-03-16 ENCOUNTER — Emergency Department (HOSPITAL_COMMUNITY): Payer: Medicare Other

## 2016-03-16 ENCOUNTER — Other Ambulatory Visit: Payer: Self-pay

## 2016-03-16 DIAGNOSIS — IMO0002 Reserved for concepts with insufficient information to code with codable children: Secondary | ICD-10-CM

## 2016-03-16 DIAGNOSIS — I5043 Acute on chronic combined systolic (congestive) and diastolic (congestive) heart failure: Secondary | ICD-10-CM

## 2016-03-16 DIAGNOSIS — D709 Neutropenia, unspecified: Secondary | ICD-10-CM | POA: Diagnosis present

## 2016-03-16 DIAGNOSIS — M4850XA Collapsed vertebra, not elsewhere classified, site unspecified, initial encounter for fracture: Secondary | ICD-10-CM | POA: Diagnosis present

## 2016-03-16 DIAGNOSIS — R0789 Other chest pain: Secondary | ICD-10-CM | POA: Diagnosis present

## 2016-03-16 DIAGNOSIS — Z853 Personal history of malignant neoplasm of breast: Secondary | ICD-10-CM | POA: Diagnosis not present

## 2016-03-16 DIAGNOSIS — Z8579 Personal history of other malignant neoplasms of lymphoid, hematopoietic and related tissues: Secondary | ICD-10-CM | POA: Diagnosis not present

## 2016-03-16 DIAGNOSIS — D630 Anemia in neoplastic disease: Secondary | ICD-10-CM | POA: Diagnosis present

## 2016-03-16 DIAGNOSIS — R42 Dizziness and giddiness: Secondary | ICD-10-CM

## 2016-03-16 DIAGNOSIS — K219 Gastro-esophageal reflux disease without esophagitis: Secondary | ICD-10-CM | POA: Diagnosis present

## 2016-03-16 DIAGNOSIS — I209 Angina pectoris, unspecified: Secondary | ICD-10-CM | POA: Diagnosis not present

## 2016-03-16 DIAGNOSIS — Z7982 Long term (current) use of aspirin: Secondary | ICD-10-CM | POA: Diagnosis not present

## 2016-03-16 DIAGNOSIS — I2510 Atherosclerotic heart disease of native coronary artery without angina pectoris: Secondary | ICD-10-CM | POA: Diagnosis not present

## 2016-03-16 DIAGNOSIS — I447 Left bundle-branch block, unspecified: Secondary | ICD-10-CM | POA: Diagnosis not present

## 2016-03-16 DIAGNOSIS — I5023 Acute on chronic systolic (congestive) heart failure: Secondary | ICD-10-CM | POA: Insufficient documentation

## 2016-03-16 DIAGNOSIS — M549 Dorsalgia, unspecified: Secondary | ICD-10-CM

## 2016-03-16 DIAGNOSIS — E039 Hypothyroidism, unspecified: Secondary | ICD-10-CM | POA: Insufficient documentation

## 2016-03-16 DIAGNOSIS — I110 Hypertensive heart disease with heart failure: Secondary | ICD-10-CM | POA: Diagnosis not present

## 2016-03-16 DIAGNOSIS — R079 Chest pain, unspecified: Secondary | ICD-10-CM | POA: Diagnosis present

## 2016-03-16 DIAGNOSIS — G8929 Other chronic pain: Secondary | ICD-10-CM | POA: Diagnosis present

## 2016-03-16 DIAGNOSIS — I10 Essential (primary) hypertension: Secondary | ICD-10-CM | POA: Diagnosis present

## 2016-03-16 DIAGNOSIS — C9002 Multiple myeloma in relapse: Secondary | ICD-10-CM | POA: Diagnosis present

## 2016-03-16 LAB — PROTIME-INR
INR: 1.14
Prothrombin Time: 14.7 seconds (ref 11.4–15.2)

## 2016-03-16 LAB — COMPREHENSIVE METABOLIC PANEL
ALT: 17 U/L (ref 14–54)
ANION GAP: 7 (ref 5–15)
AST: 22 U/L (ref 15–41)
Albumin: 2.6 g/dL — ABNORMAL LOW (ref 3.5–5.0)
Alkaline Phosphatase: 57 U/L (ref 38–126)
BILIRUBIN TOTAL: 0.8 mg/dL (ref 0.3–1.2)
BUN: 15 mg/dL (ref 6–20)
CHLORIDE: 94 mmol/L — AB (ref 101–111)
CO2: 27 mmol/L (ref 22–32)
Calcium: 8.2 mg/dL — ABNORMAL LOW (ref 8.9–10.3)
Creatinine, Ser: 0.66 mg/dL (ref 0.44–1.00)
Glucose, Bld: 89 mg/dL (ref 65–99)
POTASSIUM: 4.3 mmol/L (ref 3.5–5.1)
Sodium: 128 mmol/L — ABNORMAL LOW (ref 135–145)
TOTAL PROTEIN: 5.7 g/dL — AB (ref 6.5–8.1)

## 2016-03-16 LAB — CBC
HEMATOCRIT: 26.6 % — AB (ref 36.0–46.0)
Hemoglobin: 8.8 g/dL — ABNORMAL LOW (ref 12.0–15.0)
MCH: 35.2 pg — AB (ref 26.0–34.0)
MCHC: 33.1 g/dL (ref 30.0–36.0)
MCV: 106.4 fL — AB (ref 78.0–100.0)
PLATELETS: 124 10*3/uL — AB (ref 150–400)
RBC: 2.5 MIL/uL — ABNORMAL LOW (ref 3.87–5.11)
RDW: 16.1 % — AB (ref 11.5–15.5)
WBC: 3.3 10*3/uL — AB (ref 4.0–10.5)

## 2016-03-16 LAB — TSH: TSH: 1.734 u[IU]/mL (ref 0.350–4.500)

## 2016-03-16 LAB — I-STAT TROPONIN, ED: TROPONIN I, POC: 0.02 ng/mL (ref 0.00–0.08)

## 2016-03-16 LAB — APTT: aPTT: 32 seconds (ref 24–36)

## 2016-03-16 LAB — BRAIN NATRIURETIC PEPTIDE: B Natriuretic Peptide: 150 pg/mL — ABNORMAL HIGH (ref 0.0–100.0)

## 2016-03-16 MED ORDER — GABAPENTIN 300 MG PO CAPS
300.0000 mg | ORAL_CAPSULE | Freq: Three times a day (TID) | ORAL | Status: DC
  Administered 2016-03-16 (×2): 300 mg via ORAL
  Filled 2016-03-16 (×3): qty 1

## 2016-03-16 MED ORDER — OXYCODONE HCL 5 MG PO TABS
30.0000 mg | ORAL_TABLET | ORAL | Status: DC | PRN
  Administered 2016-03-16 – 2016-03-17 (×2): 30 mg via ORAL
  Filled 2016-03-16 (×2): qty 6

## 2016-03-16 MED ORDER — MORPHINE SULFATE ER 15 MG PO TBCR
60.0000 mg | EXTENDED_RELEASE_TABLET | Freq: Two times a day (BID) | ORAL | Status: DC
  Administered 2016-03-16 – 2016-03-17 (×2): 60 mg via ORAL
  Filled 2016-03-16 (×3): qty 4

## 2016-03-16 MED ORDER — SODIUM CHLORIDE 0.9 % IV SOLN: 250.0000 mL | INTRAVENOUS | Status: DC | PRN

## 2016-03-16 MED ORDER — ONDANSETRON HCL 4 MG/2ML IJ SOLN: 4.0000 mg | Freq: Four times a day (QID) | INTRAMUSCULAR | Status: DC | PRN

## 2016-03-16 MED ORDER — VITAMIN D 1000 UNITS PO TABS
1000.0000 [IU] | ORAL_TABLET | Freq: Every day | ORAL | Status: DC
  Administered 2016-03-16 – 2016-03-17 (×2): 1000 [IU] via ORAL
  Filled 2016-03-16 (×2): qty 1

## 2016-03-16 MED ORDER — MAGNESIUM HYDROXIDE 400 MG/5ML PO SUSP
15.0000 mL | Freq: Every day | ORAL | Status: DC | PRN
  Filled 2016-03-16: qty 30

## 2016-03-16 MED ORDER — DEXAMETHASONE 4 MG PO TABS
4.0000 mg | ORAL_TABLET | Freq: Every day | ORAL | Status: DC
  Administered 2016-03-16: 4 mg via ORAL
  Filled 2016-03-16 (×2): qty 1

## 2016-03-16 MED ORDER — POMALIDOMIDE 3 MG PO CAPS: 3.0000 mg | ORAL_CAPSULE | Freq: Every day | ORAL | Status: DC

## 2016-03-16 MED ORDER — POMALIDOMIDE 2 MG PO CAPS: 2.0000 mg | ORAL_CAPSULE | Freq: Every day | ORAL | Status: DC

## 2016-03-16 MED ORDER — ASPIRIN EC 81 MG PO TBEC
81.0000 mg | DELAYED_RELEASE_TABLET | Freq: Every day | ORAL | Status: DC
  Administered 2016-03-17: 81 mg via ORAL
  Filled 2016-03-16: qty 1

## 2016-03-16 MED ORDER — NITROGLYCERIN 0.4 MG SL SUBL
0.4000 mg | SUBLINGUAL_TABLET | SUBLINGUAL | Status: DC | PRN
  Administered 2016-03-16: 0.4 mg via SUBLINGUAL
  Filled 2016-03-16: qty 1

## 2016-03-16 MED ORDER — SODIUM CHLORIDE 0.9% FLUSH
3.0000 mL | Freq: Two times a day (BID) | INTRAVENOUS | Status: DC
  Administered 2016-03-17: 3 mL via INTRAVENOUS

## 2016-03-16 MED ORDER — SODIUM CHLORIDE 0.9 % IV SOLN
Freq: Once | INTRAVENOUS | Status: AC
  Administered 2016-03-16: 500 mL/h via INTRAVENOUS

## 2016-03-16 MED ORDER — MORPHINE SULFATE ER 15 MG PO TBCR: 60.0000 mg | EXTENDED_RELEASE_TABLET | Freq: Two times a day (BID) | ORAL | Status: DC

## 2016-03-16 MED ORDER — TRAMADOL HCL 50 MG PO TABS: 100.0000 mg | ORAL_TABLET | Freq: Four times a day (QID) | ORAL | Status: DC | PRN

## 2016-03-16 MED ORDER — IRBESARTAN 75 MG PO TABS
75.0000 mg | ORAL_TABLET | Freq: Every day | ORAL | Status: DC
  Administered 2016-03-16 – 2016-03-17 (×2): 75 mg via ORAL
  Filled 2016-03-16 (×2): qty 1

## 2016-03-16 MED ORDER — MORPHINE SULFATE ER 30 MG PO TBCR
30.0000 mg | EXTENDED_RELEASE_TABLET | Freq: Two times a day (BID) | ORAL | Status: AC
Start: 2016-03-16 — End: 2016-03-16
  Administered 2016-03-16: 30 mg via ORAL
  Filled 2016-03-16: qty 2

## 2016-03-16 MED ORDER — NITROGLYCERIN 0.4 MG SL SUBL: 0.4000 mg | SUBLINGUAL_TABLET | SUBLINGUAL | Status: DC | PRN

## 2016-03-16 MED ORDER — OXYCODONE HCL 20 MG PO TABS: 20.0000 mg | ORAL_TABLET | ORAL | Status: DC | PRN

## 2016-03-16 MED ORDER — LEVOTHYROXINE SODIUM 50 MCG PO TABS
50.0000 ug | ORAL_TABLET | Freq: Every day | ORAL | Status: DC
  Administered 2016-03-17: 50 ug via ORAL
  Filled 2016-03-16: qty 1

## 2016-03-16 MED ORDER — MIRTAZAPINE 7.5 MG PO TABS
7.5000 mg | ORAL_TABLET | Freq: Every day | ORAL | Status: DC
  Administered 2016-03-16: 7.5 mg via ORAL
  Filled 2016-03-16: qty 1

## 2016-03-16 MED ORDER — POMALIDOMIDE 3 MG PO CAPS
3.0000 mg | ORAL_CAPSULE | Freq: Once | ORAL | Status: AC
  Administered 2016-03-16: 3 mg via ORAL
  Filled 2016-03-16: qty 1

## 2016-03-16 MED ORDER — ACETAMINOPHEN 325 MG PO TABS: 650.0000 mg | ORAL_TABLET | ORAL | Status: DC | PRN

## 2016-03-16 MED ORDER — SODIUM CHLORIDE 0.9% FLUSH: 3.0000 mL | INTRAVENOUS | Status: DC | PRN

## 2016-03-16 MED ORDER — FUROSEMIDE 10 MG/ML IJ SOLN
20.0000 mg | Freq: Once | INTRAMUSCULAR | Status: AC
  Administered 2016-03-16: 20 mg via INTRAVENOUS
  Filled 2016-03-16: qty 2

## 2016-03-16 NOTE — ED Notes (Signed)
Pt became dizzy after nitro. Dr. Tamera Punt aware. HOB down. Fluids initiated.

## 2016-03-16 NOTE — ED Notes (Signed)
spo2 88%. Put Pt. On nasal cannula at 2L after speaking with Dr. Tamera Punt.

## 2016-03-16 NOTE — ED Triage Notes (Signed)
Pt arrives EMS from home with c/o sudden onset SHOB and chest pain about 6 AM Pt has HX of multiple myeloma. Given 324 ASA and 1 SL nitro by EMS. Pt c/o pressure on chest at 5/10 on arrival. 

## 2016-03-16 NOTE — ED Notes (Signed)
Pt. Pain is currently a 3 out of 10. Pt. Refused MS Contin 60mg . & states she does not want anything else for pain currently.

## 2016-03-16 NOTE — ED Provider Notes (Signed)
[facility] DEPT Provider Note   CSN: [id number] Arrival date & time: 03/16/16  6811     History   Chief Complaint Chief Complaint  Patient presents with  . Chest Pain    HPI Natasha Jackson is a 82 y.o. female.  Patient is a 82 year old female with a history of aortic regurgitation, coronary artery disease, hypertension and multiple myeloma. She has chronic bone pain and takes opioid medications for this. She denies any prior stent placement. She presents with chest pain. She states she woke up with the pain which she describes as an intense pressure to the center of her chest. It's nonradiating. She has some associated shortness of breath. No nausea vomiting or diaphoresis. It's not related to movement. It's not related to breathing. She denies any prior history of similar pain. Recent illnesses. No coughing colds or fevers. No leg pain or swelling. She received baby aspirin by EMS as well as nitroglycerin. She had one nitroglycerin which has improved her pain but she still has at present.    Chest Pain   Associated symptoms include shortness of breath. Pertinent negatives include no abdominal pain, no back pain, no cough, no diaphoresis, no dizziness, no fever, no headaches, no nausea, no numbness, no vomiting and no weakness.    Past Medical History:  Diagnosis Date  . Aortic regurgitation   . Breast cancer ([place])   . Coronary artery disease   . DCIS (ductal carcinoma in situ) 08/04/2012  . Depression 09/05/2014  . Dysphagia 01/23/2016  . Hot flash not due to menopause 12/23/2011  . HTN (hypertension)   . Hypothyroidism 10/18/2014  . Multiple myeloma   . Multiple myeloma ([place]) 10/11/2015  . Neuropathy ([place]) 12/23/2011  . Spinal fracture of T12 vertebra ([facility])    vertebral fx    Patient Active Problem List   Diagnosis Date Noted  . Dizziness 03/12/2016  . Quality of life palliative care encounter 02/07/2016  . Dysphagia 01/23/2016  . Protein-calorie malnutrition, moderate  ([place]) 01/23/2016  . GERD (gastroesophageal reflux disease) 01/09/2016  . Anemia in neoplastic disease 01/09/2016  . Constipation due to opioid therapy 10/21/2015  . Multiple myeloma ([place]) 10/11/2015  . Chronic back pain greater than 3 months duration 09/26/2015  . Left leg pain 11/22/2014  . Rib pain on left side 11/22/2014  . Hypothyroidism 10/18/2014  . Neutropenia ([place]) 09/20/2014  . Chest pain 09/20/2014  . Depression 09/05/2014  . Arthropathy   . Dehydration, moderate 08/01/2014  . Vertebral compression fracture ([place]) 07/19/2014  . Pancytopenia due to antineoplastic chemotherapy ([place]) 07/05/2014  . Rib pain on right side 06/18/2014  . Bradycardia 09/22/2012  . DCIS (ductal carcinoma in situ) 08/04/2012  . Hot flash not due to menopause 12/23/2011  . Neuropathy due to chemotherapeutic drug ([place]) 12/23/2011  . OTHER SPECIFIED IDIOPATHIC PERIPHERAL NEUROPATHY 06/25/2010  . LBBB (left bundle branch block) 06/05/2009  . Multiple myeloma in relapse ([place]) 01/04/2009  . Essential hypertension, benign 01/04/2009  . DERMATITIS 01/03/2009  . ARTHRITIS 01/03/2009  . POSTMENOPAUSAL STATUS 01/03/2009    Past Surgical History:  Procedure Laterality Date  . INCISIONAL HERNIA REPAIR    . lymph node excision - left groin      OB History    No data available       Home Medications    Prior to Admission medications   Medication Sig Start Date End Date Taking? Authorizing Provider  aspirin 325 MG EC tablet Take 325 mg by mouth daily.   Yes  Historical Provider, MD  cholecalciferol (VITAMIN D) 1000 UNITS tablet Take 1,000 Units by mouth daily. 05/01/13  Yes Heath Lark, MD  dexamethasone (DECADRON) 4 MG tablet Take 2 tablets daily (8 mg) Patient taking differently: Take 4 mg by mouth daily.  02/18/16  Yes Heath Lark, MD  gabapentin (NEURONTIN) 300 MG capsule TAKE ONE CAPSULE BY MOUTH THREE TIMES DAILY Patient taking differently: Take 300 mg by mouth 3 times daily 12/03/15  Yes Heath Lark, MD  levothyroxine (SYNTHROID, LEVOTHROID) 50 MCG tablet Take 50 mcg by mouth daily before breakfast.   Yes Historical Provider, MD  mirtazapine (REMERON) 7.5 MG tablet TAKE ONE TABLET BY MOUTH NIGHTLY AT BEDTIME Patient taking differently: Take 7.5 mg by mouth at bedtime 01/13/16  Yes Heath Lark, MD  morphine (MS CONTIN) 60 MG 12 hr tablet Take 1 tablet (60 mg total) by mouth every 12 (twelve) hours. 03/12/16  Yes Heath Lark, MD  Multiple Vitamin (MULTIVITAMIN WITH MINERALS) TABS tablet Take 1 tablet by mouth daily.   Yes Historical Provider, MD  oxyCODONE (ROXICODONE) 30 MG immediate release tablet Take 1 tablet (30 mg total) by mouth every 4 (four) hours as needed for severe pain. 03/12/16  Yes Heath Lark, MD  Polyvinyl Alcohol-Povidone (REFRESH OP) Place 2 drops into both eyes 2 (two) times daily as needed (dry eyes).   Yes Historical Provider, MD  pomalidomide (POMALYST) 2 MG capsule Take 1 capsule (2 mg total) by mouth daily. Take with water on days 1-21. Repeat every 28 days. 03/12/16  Yes Heath Lark, MD  valsartan (DIOVAN) 160 MG tablet Take 1 tablet (160 mg total) by mouth daily. 10/23/15  Yes Eileen Stanford, PA-C  acyclovir (ZOVIRAX) 400 MG tablet Take 1 tablet (400 mg total) by mouth daily. Patient not taking: Reported on 03/16/2016 12/24/15   Heath Lark, MD  cyclobenzaprine (FLEXERIL) 5 MG tablet Take 1 tablet (5 mg total) by mouth 3 (three) times daily as needed for muscle spasms. Patient not taking: Reported on 03/16/2016 12/16/15   Heath Lark, MD  omeprazole (PRILOSEC) 20 MG capsule Take 1 capsule (20 mg total) by mouth daily. Patient not taking: Reported on 03/16/2016 01/09/16   Heath Lark, MD  Oxycodone HCl 20 MG TABS Take 1 tablet (20 mg total) by mouth every 4 (four) hours as needed. Patient not taking: Reported on 03/16/2016 03/10/16   Heath Lark, MD  traMADol (ULTRAM) 50 MG tablet Take 2 tablets (100 mg total) by mouth every 6 (six) hours as needed for moderate pain (one to two  tablets every 6 hrs as needed.). Patient not taking: Reported on 03/16/2016 01/27/16   Heath Lark, MD  valsartan (DIOVAN) 160 MG tablet Take 1 tablet (160 mg total) by mouth daily. Please call and schedule a 3 month follow up with Dr Johnsie Cancel per last office visit Patient not taking: Reported on 03/16/2016 02/26/16   Josue Hector, MD    Family History Family History  Problem Relation Age of Onset  . Stroke Father   . Heart failure Mother     Social History Social History  Substance Use Topics  . Smoking status: Never Smoker  . Smokeless tobacco: Never Used  . Alcohol use No     Allergies   Review of patient's allergies indicates no known allergies.   Review of Systems Review of Systems  Constitutional: Negative for chills, diaphoresis, fatigue and fever.  HENT: Negative for congestion, rhinorrhea and sneezing.   Eyes: Negative.   Respiratory: Positive for  shortness of breath. Negative for cough and chest tightness.   Cardiovascular: Positive for chest pain. Negative for leg swelling.  Gastrointestinal: Negative for abdominal pain, blood in stool, diarrhea, nausea and vomiting.  Genitourinary: Negative for difficulty urinating, flank pain, frequency and hematuria.  Musculoskeletal: Negative for arthralgias and back pain.  Skin: Negative for rash.  Neurological: Negative for dizziness, speech difficulty, weakness, numbness and headaches.     Physical Exam Updated Vital Signs BP (!) 124/53 (BP Location: Right Arm)   Pulse 61   Temp 99.9 F (37.7 C) (Oral)   Resp 14   Ht 5' 4"  (1.626 m)   Wt 110 lb (49.9 kg)   SpO2 100%   BMI 18.88 kg/m   Physical Exam  Constitutional: She is oriented to person, place, and time. She appears well-developed and well-nourished.  HENT:  Head: Normocephalic and atraumatic.  Eyes: Pupils are equal, round, and reactive to light.  Neck: Normal range of motion. Neck supple.  Cardiovascular: Normal rate, regular rhythm and normal heart  sounds.   Pulmonary/Chest: Effort normal and breath sounds normal. No respiratory distress. She has no wheezes. She has no rales. She exhibits tenderness (Mild reproducibility to her chest pain).  Abdominal: Soft. Bowel sounds are normal. There is no tenderness. There is no rebound and no guarding.  Musculoskeletal: Normal range of motion. She exhibits no edema.  Lymphadenopathy:    She has no cervical adenopathy.  Neurological: She is alert and oriented to person, place, and time.  Skin: Skin is warm and dry. No rash noted.  Psychiatric: She has a normal mood and affect.     ED Treatments / Results  Labs (all labs ordered are listed, but only abnormal results are displayed) Labs Reviewed  CBC - Abnormal; Notable for the following:       Result Value   WBC 3.3 (*)    RBC 2.50 (*)    Hemoglobin 8.8 (*)    HCT 26.6 (*)    MCV 106.4 (*)    MCH 35.2 (*)    RDW 16.1 (*)    Platelets 124 (*)    All other components within normal limits  COMPREHENSIVE METABOLIC PANEL - Abnormal; Notable for the following:    Sodium 128 (*)    Chloride 94 (*)    Calcium 8.2 (*)    Total Protein 5.7 (*)    Albumin 2.6 (*)    All other components within normal limits  BRAIN NATRIURETIC PEPTIDE - Abnormal; Notable for the following:    B Natriuretic Peptide 150.0 (*)    All other components within normal limits  APTT  I-STAT TROPOININ, ED    EKG  EKG Interpretation  Date/Time:  Monday March 16 2016 09:22:53 EDT Ventricular Rate:  95 PR Interval:    QRS Duration: 139 QT Interval:  413 QTC Calculation: 520 R Axis:   -80 Text Interpretation:  Sinus rhythm Left bundle branch block similar to prior EKG, slightly more prominent ST elevation inferiorly Confirmed by Holland Nickson  MD, Anyeli Hockenbury ([id number]) on 03/16/2016 9:36:44 AM       Radiology Dg Chest Portable 1 View  Result Date: 03/16/2016 CLINICAL DATA:  Chest pain since last night.  Bone cancer on EXAM: PORTABLE CHEST 1 VIEW COMPARISON:   12/16/2015 metastatic survey FINDINGS: Cardiopericardial enlargement and vascular pedicle widening. Hazy appearance at the bases may reflect small effusions. Pulmonary venous congestion. No convincing pneumonia. Porta catheter on the right with tip at the right atrium. IMPRESSION: Cardiomegaly and pulmonary  venous congestion. Electronically Signed   By: Monte Fantasia M.D.   On: 03/16/2016 10:42    Procedures Procedures (including critical care time)  Medications Ordered in ED Medications  nitroGLYCERIN (NITROSTAT) SL tablet 0.4 mg (0.4 mg Sublingual Given 03/16/16 1000)  morphine (MS CONTIN) 12 hr tablet 60 mg (not administered)  0.9 %  sodium chloride infusion ( Intravenous Stopped 03/16/16 1132)  furosemide (LASIX) injection 20 mg (20 mg Intravenous Given 03/16/16 1314)     Initial Impression / Assessment and Plan / ED Course  I have reviewed the triage vital signs and the nursing notes.  Pertinent labs & imaging results that were available during my care of the patient were reviewed by me and considered in my medical decision making (see chart for details).  Clinical Course  Comment By Time  Pt with CP/pressure.  LBBB present, noted on old EKG, but more prominent ST elevation inferiorly.  Discussed with Dr. Angelena Form who is in cath lab, will send someone to see pt Malvin Johns, MD 09/11 [phone]  PT pain free now.  Sats in 80s off oxygen.  Placed on N/C.  CXR shows mild vascular congestion.  Will check BNP Malvin Johns, MD 09/11 1135   Cards has seen the pt and will admit  Final Clinical Impressions(s) / ED Diagnoses   Final diagnoses:  Chest pain, unspecified chest pain type  Acute on chronic systolic congestive heart failure [facility])    New Prescriptions New Prescriptions   No medications on file     Malvin Johns, MD 03/16/16 1330

## 2016-03-16 NOTE — ED Notes (Signed)
Pt states she is pain free 

## 2016-03-16 NOTE — ED Notes (Signed)
Cardiology at bedside.

## 2016-03-16 NOTE — ED Notes (Signed)
Pt has denied pain but daughter states she has her usual back pain but is concerned that the 108 MS contin dose is too high. Will contact ordering provider to adjust.

## 2016-03-16 NOTE — H&P (Addendum)
CARDIOLOGY H&P    Patient ID: Natasha Jackson MRN: [id number] DOB/AGE: [birth year] 80 y.o.  Admit date: 03/16/2016  Primary Physician:   Thressa Sheller, MD Primary Cardiologist:   Dr. Johnsie Cancel Reason for Consultation:  Chest pain  HPI: Natasha Jackson is a 82 y.o. female with a history of HTN, multiple myeloma with current relapse, hx of breast cancer s/p lumpectomy/chemo/XRT, mild LV dysfunction w/ no previous clinical CHF (45-50%), modAR/mildMR, hypothyroidism, bradycardia and LBBB who presented to [facility] ED this AM for evaluation of chest pain.   She saw Dr. Stanford Breed in 09/2012 for evaluation of bradycardia and dizziness. Holter monitor in 09/2012 showed SR with PAC's and PVC's; mean HR 62 with min rate early in am 46 with sinus bradycaria no AV block. Dr. Stanford Breed noted that because of her MM, she would be at risk for amyloidosis as a possible contributor to her conduction disease.   2D ECHO 10/2013 showed EF 40-45% w/ diffuse HK, G1DD, mild AR, mild-mod MR, lipomatous hypertrophy of atrial septum.   She was seen in the hospital by Dr Mare Ferrari 3/16/16for atypical chest pain after a fracture. Myovue at that time was normal w/ EF 64%.  She called the office on 10/17/15 about multiple episodes of dizziness. She thought that it was due to her valsartan and stopped it. Her blood pressure then increased. She was encouraged to resume her valsartan and was added on to my clinic schedule. I felt like her sx we c/w vertigo and put her back on Valsartan. Repeat 2D ECHO 11/2015 showed EF improved a little, EF 45-50%. Mild-mod AR, mild MR, mildly dilated asc aortic diameter. She was currently in remission of her MM with asymptomatic vertebral compression fractures at the time.  Since that time she has developed a relapse in her MM and has new painful compression fractures at T10 and 11. She has been scheduled for vertebroplasty/kyphoplasty with interventional radiology. Her kyphoplasty appointment was  delayed due to pancytopenia (medication SE of Pomalidomide). She takes large amounts of opoids for pain control. She has also lost significant weight. She was seen by Dr. Alvy Bimler on 03/12/16 and her Losartan was cut in half due to dizziness and weakness.   She was in her usual state of health until this AM when she was awoken from sleep with chest pressure in the center of her chest. No radiation and no associated SOB or n/v . She said last night her legs were a little swollen which is unusual for her and she had some mild SOB and orthopnea. She cannot lie flat well 2/2 back pain. Her chest pain was slightly relieved by SL NTG but has now completely resolved.  In the ER her BNP is mildly elevated and CXR with pulmonary vascular congestion. Per discussion with her daughter in the hall, Dr. Alvy Bimler thinks that she will be lucky to make it through the winter with her progressively worsening MM. She and her husband have been very tearful lately coming to terms with this.    Past Medical History:  Diagnosis Date  . Aortic regurgitation   . Breast cancer ([place])   . Coronary artery disease   . DCIS (ductal carcinoma in situ) 08/04/2012  . Depression 09/05/2014  . Dysphagia 01/23/2016  . Hot flash not due to menopause 12/23/2011  . HTN (hypertension)   . Hypothyroidism 10/18/2014  . Multiple myeloma   . Multiple myeloma ([place]) 10/11/2015  . Neuropathy ([place]) 12/23/2011  . Spinal fracture of T12 vertebra (  [place])    vertebral fx     Past Surgical History:  Procedure Laterality Date  . INCISIONAL HERNIA REPAIR    . lymph node excision - left groin      No Known Allergies  I have reviewed the patient's current medications     nitroGLYCERIN  Prior to Admission medications   Medication Sig Start Date End Date Taking? Authorizing Provider  aspirin 325 MG EC tablet Take 325 mg by mouth daily.   Yes Historical Provider, MD  cholecalciferol (VITAMIN D) 1000 UNITS tablet Take 1,000 Units by mouth daily.  05/01/13  Yes Heath Lark, MD  dexamethasone (DECADRON) 4 MG tablet Take 2 tablets daily (8 mg) Patient taking differently: Take 4 mg by mouth daily.  02/18/16  Yes Heath Lark, MD  gabapentin (NEURONTIN) 300 MG capsule TAKE ONE CAPSULE BY MOUTH THREE TIMES DAILY Patient taking differently: Take 300 mg by mouth 3 times daily 12/03/15  Yes Heath Lark, MD  levothyroxine (SYNTHROID, LEVOTHROID) 50 MCG tablet Take 50 mcg by mouth daily before breakfast.   Yes Historical Provider, MD  mirtazapine (REMERON) 7.5 MG tablet TAKE ONE TABLET BY MOUTH NIGHTLY AT BEDTIME Patient taking differently: Take 7.5 mg by mouth at bedtime 01/13/16  Yes Heath Lark, MD  morphine (MS CONTIN) 60 MG 12 hr tablet Take 1 tablet (60 mg total) by mouth every 12 (twelve) hours. 03/12/16  Yes Heath Lark, MD  Multiple Vitamin (MULTIVITAMIN WITH MINERALS) TABS tablet Take 1 tablet by mouth daily.   Yes Historical Provider, MD  oxyCODONE (ROXICODONE) 30 MG immediate release tablet Take 1 tablet (30 mg total) by mouth every 4 (four) hours as needed for severe pain. 03/12/16  Yes Heath Lark, MD  Polyvinyl Alcohol-Povidone (REFRESH OP) Place 2 drops into both eyes 2 (two) times daily as needed (dry eyes).   Yes Historical Provider, MD  pomalidomide (POMALYST) 2 MG capsule Take 1 capsule (2 mg total) by mouth daily. Take with water on days 1-21. Repeat every 28 days. 03/12/16  Yes Heath Lark, MD  valsartan (DIOVAN) 160 MG tablet Take 1 tablet (160 mg total) by mouth daily. 10/23/15  Yes Eileen Stanford, PA-C  acyclovir (ZOVIRAX) 400 MG tablet Take 1 tablet (400 mg total) by mouth daily. Patient not taking: Reported on 03/16/2016 12/24/15   Heath Lark, MD  cyclobenzaprine (FLEXERIL) 5 MG tablet Take 1 tablet (5 mg total) by mouth 3 (three) times daily as needed for muscle spasms. Patient not taking: Reported on 03/16/2016 12/16/15   Heath Lark, MD  omeprazole (PRILOSEC) 20 MG capsule Take 1 capsule (20 mg total) by mouth daily. Patient not taking:  Reported on 03/16/2016 01/09/16   Heath Lark, MD  Oxycodone HCl 20 MG TABS Take 1 tablet (20 mg total) by mouth every 4 (four) hours as needed. Patient not taking: Reported on 03/16/2016 03/10/16   Heath Lark, MD  traMADol (ULTRAM) 50 MG tablet Take 2 tablets (100 mg total) by mouth every 6 (six) hours as needed for moderate pain (one to two tablets every 6 hrs as needed.). Patient not taking: Reported on 03/16/2016 01/27/16   Heath Lark, MD  valsartan (DIOVAN) 160 MG tablet Take 1 tablet (160 mg total) by mouth daily. Please call and schedule a 3 month follow up with Dr Johnsie Cancel per last office visit Patient not taking: Reported on 03/16/2016 02/26/16   Josue Hector, MD     Social History   Social History  . Marital status: Married  Spouse name: N/A  . Number of children: N/A  . Years of education: N/A   Occupational History  . Not on file.   Social History Main Topics  . Smoking status: Never Smoker  . Smokeless tobacco: Never Used  . Alcohol use No  . Drug use: No  . Sexual activity: No   Other Topics Concern  . Not on file   Social History Narrative  . No narrative on file    Family Status  Relation Status  . Father Deceased at age 50  . Mother Deceased at age 4  . Maternal Grandmother Deceased  . Maternal Grandfather Deceased  . Paternal Grandmother Deceased  . Paternal Grandfather Deceased   Family History  Problem Relation Age of Onset  . Stroke Father   . Heart failure Mother    ROS:  Full 14 point review of systems complete and found to be negative unless listed above.  Physical Exam: Blood pressure (!) 117/48, pulse 64, temperature 99.9 F (37.7 C), temperature source Oral, resp. rate 16, height 5' 4"  (1.626 m), weight 110 lb (49.9 kg), SpO2 (!) 88 %.  General: Well developed, well nourished, female in no acute distress. Chronically ill appearing Head: Eyes PERRLA, No xanthomas.   Normocephalic and atraumatic, oropharynx without edema or exudate.   Lungs:  crackles at bases Heart: HRRR S1 S2, no rub/gallop, Heart regular rate and rhythm with S1, S2  murmur. pulses are 2+ extrem.   Neck: No carotid bruits. No lymphadenopathy. +  JVD. Abdomen: Bowel sounds present, abdomen soft and non-tender without masses or hernias noted. Msk:  No spine or cva tenderness. No weakness, no joint deformities or effusions. Extremities: No clubbing or cyanosis.  1+ LE bilateral edema.  Neuro: Alert and oriented X 3. No focal deficits noted. Psych:  Good affect, responds appropriately Skin: No rashes or lesions noted.  Labs:   Lab Results  Component Value Date   WBC 3.3 (L) 03/16/2016   HGB 8.8 (L) 03/16/2016   HCT 26.6 (L) 03/16/2016   MCV 106.4 (H) 03/16/2016   PLT 124 (L) 03/16/2016   No results for input(s): INR in the last 72 hours.   Recent Labs Lab 03/16/16 0935  NA 128*  K 4.3  CL 94*  CO2 27  BUN 15  CREATININE 0.66  CALCIUM 8.2*  PROT 5.7*  BILITOT 0.8  ALKPHOS 57  ALT 17  AST 22  GLUCOSE 89  ALBUMIN 2.6*   Magnesium  Date Value Ref Range Status  08/04/2014 1.6 1.5 - 2.5 mg/dL Final   No results for input(s): CKTOTAL, CKMB, TROPONINI in the last 72 hours.  Recent Labs  03/16/16 0942  TROPIPOC 0.02    2D ECHO: 11/26/2015 LV EF: 45% -   50% Study Conclusions - Left ventricle: The cavity size was normal. Systolic function was   mildly reduced. The estimated ejection fraction was in the range   of 45% to 50%. Wall motion was normal; there were no regional   wall motion abnormalities. There was an increased relative   contribution of atrial contraction to ventricular filling.   Doppler parameters are consistent with abnormal left ventricular   relaxation (grade 1 diastolic dysfunction). - Ventricular septum: Septal motion showed moderate paradox. These   changes are consistent with intraventricular conduction delay. - Aortic valve: Moderate diffuse thickening and calcification.   There was mild to moderate  regurgitation. - Aorta: Ascending aortic diameter: 39 mm (S). - Ascending aorta: The ascending aorta was  mildly dilated. - Mitral valve: There was mild regurgitation.  Myoview: 09/2014 IMPRESSION: 1. No reversible ischemia or infarction. 2. Normal left ventricular wall motion. 3. Left ventricular ejection fraction 64% 4. Low-risk stress test findings*.   ECG:  LBBB HR 95, some mild ST elevation in inferior leads more prominent than previous tracings.   Radiology:  Dg Chest Portable 1 View  Result Date: 03/16/2016 CLINICAL DATA:  Chest pain since last night.  Bone cancer on EXAM: PORTABLE CHEST 1 VIEW COMPARISON:  12/16/2015 metastatic survey FINDINGS: Cardiopericardial enlargement and vascular pedicle widening. Hazy appearance at the bases may reflect small effusions. Pulmonary venous congestion. No convincing pneumonia. Porta catheter on the right with tip at the right atrium. IMPRESSION: Cardiomegaly and pulmonary venous congestion. Electronically Signed   By: Monte Fantasia M.D.   On: 03/16/2016 10:42    ASSESSMENT AND PLAN:    Principal Problem:   Chest pain Active Problems:   Multiple myeloma in relapse [facility])   Essential hypertension, benign   LBBB (left bundle branch block)   Vertebral compression fracture ([facility])   Neutropenia ([facility])   Hypothyroidism   Chronic back pain greater than 3 months duration   GERD (gastroesophageal reflux disease)   Anemia in neoplastic disease   Dizziness  Acute on chronic combined S/D CHF: 2D ECHO 11/2015 showed EF improved a little, EF 45-50%. Mild-mod AR, mild MR, mildly dilated asc aortic diameter.   -- This is her first clinical episode of CHF. CXR c/w pulmonary vascular congestion and BNP mildly elevated. She has some JVD, mild LE edema, SOB and orthopnea. Will trial IV lasix 20 mg as she is lasix naive.  -- Continue valsartan. No BB with a history of bradycardia.   Chest pain: this may be related to volume overload. Initial troponin is  negative and she is currently chest pain free. ECG with some more prominent ST elevation in inferior leads but not diagnostic of STEMI. Would continue to monitor. Even if she rules in, I am not sure she would be a good cath candidate due to her pancytopenia, advanced age and progressive MM. A palliative approach may need to be initiated.   HTN: with relatively low BPs recent requiring de escalation of her ARB   Mod AR/mild MR: stable on most recent echo.    LBBB: stable.   Hypothyroidism: continue synthroid   Multiple myeloma w/ relapse and vertebral compression fractures: followed closely by Dr. Alvy Bimler. She is supposed to get verteboplasty/kyphoplasty in near future  Pancytopenia: related to MM medications  Hx of breast cancer: in remission.   Chronic pain: she will require continuation of her home narcotics while admitted.   Signed: Angelena Form, PA-C 03/16/2016 12:05 PM  Pager [phone]  Co-Sign MD  The patient has been seen in conjunction with Nell Range, PAC. All aspects of care have been considered and discussed. The patient has been personally interviewed, examined, and all clinical data has been reviewed.   Acute onset of dyspnea and chest pressure concerning for acute coronary syndrome. Possibilities for chest pain include stress cardiomyopathy, coronary artery disease with unstable angina, versus noncardiac chest pain. Suspect acute on chronic combined systolic heart failure as explanation for dyspnea..  Observation status with serial cardiac markers, tracking of symptoms, gentle diuresis, neutropenia precautions, limited CODE STATUS, and notification of hematology/Oncology attending, Dr. Alvy Bimler.  Overall prognosis is guarded.

## 2016-03-17 ENCOUNTER — Telehealth: Payer: Self-pay | Admitting: *Deleted

## 2016-03-17 ENCOUNTER — Other Ambulatory Visit: Payer: Self-pay | Admitting: Hematology and Oncology

## 2016-03-17 ENCOUNTER — Other Ambulatory Visit: Payer: Self-pay | Admitting: *Deleted

## 2016-03-17 DIAGNOSIS — I5023 Acute on chronic systolic (congestive) heart failure: Secondary | ICD-10-CM | POA: Diagnosis not present

## 2016-03-17 DIAGNOSIS — I447 Left bundle-branch block, unspecified: Secondary | ICD-10-CM | POA: Diagnosis not present

## 2016-03-17 DIAGNOSIS — I209 Angina pectoris, unspecified: Secondary | ICD-10-CM | POA: Diagnosis not present

## 2016-03-17 DIAGNOSIS — D702 Other drug-induced agranulocytosis: Secondary | ICD-10-CM

## 2016-03-17 DIAGNOSIS — I2510 Atherosclerotic heart disease of native coronary artery without angina pectoris: Secondary | ICD-10-CM | POA: Diagnosis not present

## 2016-03-17 DIAGNOSIS — G8929 Other chronic pain: Secondary | ICD-10-CM

## 2016-03-17 DIAGNOSIS — E039 Hypothyroidism, unspecified: Secondary | ICD-10-CM | POA: Diagnosis not present

## 2016-03-17 DIAGNOSIS — I110 Hypertensive heart disease with heart failure: Secondary | ICD-10-CM | POA: Diagnosis not present

## 2016-03-17 DIAGNOSIS — M549 Dorsalgia, unspecified: Secondary | ICD-10-CM

## 2016-03-17 LAB — LIPID PANEL
CHOLESTEROL: 158 mg/dL (ref 0–200)
HDL: 84 mg/dL (ref >40)
LDL Cholesterol: 65 mg/dL (ref 0–99)
Total CHOL/HDL Ratio: 1.9 RATIO
Triglycerides: 46 mg/dL (ref <150)
VLDL: 9 mg/dL (ref 0–40)

## 2016-03-17 LAB — CBC
HEMATOCRIT: 26.2 % — AB (ref 36.0–46.0)
HEMOGLOBIN: 8.7 g/dL — AB (ref 12.0–15.0)
MCH: 35.1 pg — AB (ref 26.0–34.0)
MCHC: 33.2 g/dL (ref 30.0–36.0)
MCV: 105.6 fL — AB (ref 78.0–100.0)
Platelets: 132 10*3/uL — ABNORMAL LOW (ref 150–400)
RBC: 2.48 MIL/uL — AB (ref 3.87–5.11)
RDW: 15.9 % — ABNORMAL HIGH (ref 11.5–15.5)
WBC: 3.9 10*3/uL — ABNORMAL LOW (ref 4.0–10.5)

## 2016-03-17 LAB — COMPREHENSIVE METABOLIC PANEL
ALT: 18 U/L (ref 14–54)
ANION GAP: 7 (ref 5–15)
AST: 19 U/L (ref 15–41)
Albumin: 2.5 g/dL — ABNORMAL LOW (ref 3.5–5.0)
Alkaline Phosphatase: 56 U/L (ref 38–126)
BUN: 14 mg/dL (ref 6–20)
CALCIUM: 7.9 mg/dL — AB (ref 8.9–10.3)
CHLORIDE: 95 mmol/L — AB (ref 101–111)
CO2: 27 mmol/L (ref 22–32)
Creatinine, Ser: 0.65 mg/dL (ref 0.44–1.00)
GFR calc non Af Amer: >60 mL/min (ref >60)
Glucose, Bld: 128 mg/dL — ABNORMAL HIGH (ref 65–99)
POTASSIUM: 4.2 mmol/L (ref 3.5–5.1)
SODIUM: 129 mmol/L — AB (ref 135–145)
Total Bilirubin: 0.6 mg/dL (ref 0.3–1.2)
Total Protein: 5.5 g/dL — ABNORMAL LOW (ref 6.5–8.1)

## 2016-03-17 LAB — TROPONIN I: Troponin I: <0.03 ng/mL (ref <0.03)

## 2016-03-17 LAB — PROTIME-INR
INR: 1.19
PROTHROMBIN TIME: 15.2 s (ref 11.4–15.2)

## 2016-03-17 MED ORDER — NITROGLYCERIN 0.4 MG SL SUBL: 0.4000 mg | SUBLINGUAL_TABLET | SUBLINGUAL | 12 refills | Status: DC | PRN

## 2016-03-17 MED ORDER — NITROGLYCERIN 0.4 MG SL SUBL: 0.4000 mg | SUBLINGUAL_TABLET | SUBLINGUAL | 11 refills | Status: DC | PRN

## 2016-03-17 MED ORDER — ASPIRIN 81 MG PO TBEC: 81.0000 mg | DELAYED_RELEASE_TABLET | Freq: Every day | ORAL | Status: DC

## 2016-03-17 MED ORDER — NITROGLYCERIN 0.4 MG SL SUBL: 0.4000 mg | SUBLINGUAL_TABLET | SUBLINGUAL | 11 refills | Status: AC | PRN

## 2016-03-17 NOTE — Discharge Summary (Signed)
Discharge Summary    Patient ID: Natasha Jackson,  MRN: [id number], DOB/AGE: 11/02/1925 82 y.o.  Admit date: 03/16/2016 Discharge date: 03/17/2016  Primary Care Provider: [facility] Primary Cardiologist: Dr. Johnsie Cancel   Discharge Diagnoses    Principal Problem:   Chest pain Active Problems:   Multiple myeloma in relapse [facility])   Essential hypertension, benign   LBBB (left bundle branch block)   Vertebral compression fracture (HCC)   Neutropenia (HCC)   Hypothyroidism   Chronic back pain greater than 3 months duration   GERD (gastroesophageal reflux disease)   Anemia in neoplastic disease   Dizziness   Allergies No Known Allergies   History of Present Illness     Natasha Jackson is a 82 y.o. female with a history of HTN, multiple myeloma with current relapse, hx of breast cancer s/p lumpectomy/chemo/XRT, mild LV dysfunction w/ no previous clinical CHF (45-50%), modAR/mildMR, hypothyroidism, bradycardia and LBBB who presented to [facility] ED this AM for evaluation of chest pain.   She saw Dr. Stanford Breed in 09/2012 for evaluation of bradycardia and dizziness. Holter monitor in 09/2012 showed SR with PAC's and PVC's; mean HR 62 with min rate early in am 46 with sinus bradycaria no AV block. Dr. Stanford Breed noted that because of her MM, she would be at risk for amyloidosis as a possible contributor to her conduction disease.   2D ECHO 10/2013 showed EF 40-45% w/ diffuse HK, G1DD, mild AR, mild-mod MR, lipomatous hypertrophy of atrial septum.   She was seen in the hospital by Dr Mare Ferrari 3/16/16for atypical chest pain after a fracture. Myovue at that time was normal w/ EF 64%.  She called the office on 10/17/15 about multiple episodes of dizziness. She thought that it was due to her valsartan and stopped it. Her blood pressure then increased. She was encouraged to resume her valsartan and was added on to my clinic schedule. I felt like her sx we c/w vertigo and put her back on Valsartan.  Repeat 2D ECHO 11/2015 showed EF improved a little, EF 45-50%. Mild-mod AR, mild MR, mildly dilated asc aortic diameter. She was currently in remission of her MM with asymptomatic vertebral compression fractures at the time.  Since that time she has developed a relapse in her MM and has new painful compression fractures at T10 and 11. She has been scheduled for vertebroplasty/kyphoplasty with interventional radiology. Her kyphoplasty appointment was delayed due to pancytopenia (medication SE of Pomalidomide). She takes large amounts of opoids for pain control. She has also lost significant weight. She was seen by Dr. Alvy Bimler on 03/12/16 and her Losartan was cut in half due to dizziness and weakness.   She was in her usual state of health until the morning of admission when she was awoken from sleep with chest pressure in the center of her chest. No radiation and no associated SOB or n/v . She said her legs were a little swollen which is unusual for her and she had some mild SOB and orthopnea. She cannot lie flat well 2/2 back pain. Her chest pain was slightly relieved by SL NTG but then completely resolved.  In the ER her BNP was mildly elevated and CXR with pulmonary vascular congestion. Per discussion with her daughter in the hall, Dr. Alvy Bimler thinks that she will be lucky to make it through the winter with her progressively worsening MM. She and her husband have been very tearful lately coming to terms with this. It was decided to admit  her for light diuresis and continued observation. She was admitted with neutropenia precautions.   Hospital Course     Consultants: none  Acute on chronic combined S/D CHF: 2D ECHO 11/2015 showed EF improved a little, EF 45-50%. Mild-mod AR, mild MR, mildly dilated asc aortic diameter.   -- This is her first clinical episode of CHF. CXR c/w pulmonary vascular congestion and BNP mildly elevated. She has some JVD, mild LE edema, SOB and orthopnea. She felt much better  after one dose of IV lasix 71m. No maintenance diuretic necessary per discussion with Dr. STamala Julian -- Continue valsartan. No BB with a history of bradycardia.   Chest pain: The chest discomfort and dyspnea occurred in the setting of significant stress related to her home situation, the status of her husband who she has to take care of, and her medical illness that is causing increasing weakness and debility. Per Dr. STamala Julian "Ischemic quality chest discomfort associated with dyspnea in the setting of heavy stress raises the question of Stress Cardiomyopathy. Absence of enzyme elevation goes against this diagnosis. Other potential explanations could be underlying coronary disease, transient coronary spasm, and clotting with reperfusion (very unlikely without enzyme bump)." She was discharged with SL NTG. If recurring episodes of chest discomfort may need to add long-acting nitrates or other anti-ischemic therapy. Will continue her on ASA 837mdaily.   Multiple myeloma w/ relapse and vertebral compression fractures: followed closely by Dr. GoAlvy Bimlernd with a poor prognosis. She is supposed to get verteboplasty/kyphoplasty in near future. Cleared from the cardiac standpoint to pursue kyphoplasty.  HTN: with relatively low BPs recent requiring de escalation of her ARB   Mod AR/mild MR: stable on most recent echo.    LBBB: stable.   Hypothyroidism: continue synthroid   Pancytopenia: related to MM medications  Hx of breast cancer: in remission.   Chronic pain: she will require continuation of her narcotic regimen  Dispo: Patient was made a limited code during this admission. Chest compressions and intubation should not be performed after discussing with the patient's power of attorney, daughter  The patient has had an uncomplicated hospital course and is recovering well. She has been seen by Dr.Smtih today and deemed ready for discharge home. All follow-up appointments have been scheduled.  Discharge medications are listed below.  _____________  Discharge Vitals Blood pressure (!) 114/59, pulse (!) 58, temperature 98 F (36.7 C), temperature source Oral, resp. rate (!) 22, height 5' 4"  (1.626 m), weight 106 lb 6.4 oz (48.3 kg), SpO2 100 %.  Filed Weights   03/16/16 0926 03/16/16 1531 03/17/16 0557  Weight: 110 lb (49.9 kg) 100 lb 1.4 oz (45.4 kg) 106 lb 6.4 oz (48.3 kg)    Labs & Radiologic Studies     CBC  Recent Labs  03/16/16 0935 03/17/16 0157  WBC 3.3* 3.9*  HGB 8.8* 8.7*  HCT 26.6* 26.2*  MCV 106.4* 105.6*  PLT 124* 13188  Basic Metabolic Panel  Recent Labs  03/16/16 0935 03/17/16 0157  NA 128* 129*  K 4.3 4.2  CL 94* 95*  CO2 27 27  GLUCOSE 89 128*  BUN 15 14  CREATININE 0.66 0.65  CALCIUM 8.2* 7.9*   Liver Function Tests  Recent Labs  03/16/16 0935 03/17/16 0157  AST 22 19  ALT 17 18  ALKPHOS 57 56  BILITOT 0.8 0.6  PROT 5.7* 5.5*  ALBUMIN 2.6* 2.5*   No results for input(s): LIPASE, AMYLASE in the last 72 hours. Cardiac  Enzymes  Recent Labs  03/16/16 1956 03/17/16 0157 03/17/16 0916  TROPONINI <0.03 <0.03 <0.03   BNP Invalid input(s): POCBNP D-Dimer No results for input(s): DDIMER in the last 72 hours. Hemoglobin A1C No results for input(s): HGBA1C in the last 72 hours. Fasting Lipid Panel  Recent Labs  03/17/16 0157  CHOL 158  HDL 84  LDLCALC 65  TRIG 46  CHOLHDL 1.9   Thyroid Function Tests  Recent Labs  03/16/16 1956  TSH 1.734    Dg Chest Portable 1 View  Result Date: 03/16/2016 CLINICAL DATA:  Chest pain since last night.  Bone cancer on EXAM: PORTABLE CHEST 1 VIEW COMPARISON:  12/16/2015 metastatic survey FINDINGS: Cardiopericardial enlargement and vascular pedicle widening. Hazy appearance at the bases may reflect small effusions. Pulmonary venous congestion. No convincing pneumonia. Porta catheter on the right with tip at the right atrium. IMPRESSION: Cardiomegaly and pulmonary venous  congestion. Electronically Signed   By: Monte Fantasia M.D.   On: 03/16/2016 10:42     Diagnostic Studies/Procedures    none _____________    Disposition   Pt is being discharged home today in good condition.  Follow-up Plans & Appointments    Follow-up Information    Angelena Form, PA-C. Go on 03/24/2016.   Specialties:  Cardiology, Radiology Why:  @ 11am.  Contact information: [place] [address] [phone]            Discharge Medications     Medication List    TAKE these medications   acyclovir 400 MG tablet Commonly known as:  ZOVIRAX Take 1 tablet (400 mg total) by mouth daily.   aspirin 81 MG EC tablet Take 1 tablet (81 mg total) by mouth daily. What changed:  medication strength  how much to take   cholecalciferol 1000 units tablet Commonly known as:  VITAMIN D Take 1,000 Units by mouth daily.   cyclobenzaprine 5 MG tablet Commonly known as:  FLEXERIL Take 1 tablet (5 mg total) by mouth 3 (three) times daily as needed for muscle spasms.   dexamethasone 4 MG tablet Commonly known as:  DECADRON Take 2 tablets daily (8 mg) What changed:  how much to take  how to take this  when to take this  additional instructions   gabapentin 300 MG capsule Commonly known as:  NEURONTIN TAKE ONE CAPSULE BY MOUTH THREE TIMES DAILY What changed:  See the new instructions.   levothyroxine 50 MCG tablet Commonly known as:  SYNTHROID, LEVOTHROID Take 50 mcg by mouth daily before breakfast.   mirtazapine 7.5 MG tablet Commonly known as:  REMERON TAKE ONE TABLET BY MOUTH NIGHTLY AT BEDTIME What changed:  See the new instructions.   morphine 60 MG 12 hr tablet Commonly known as:  MS CONTIN Take 1 tablet (60 mg total) by mouth every 12 (twelve) hours.   multivitamin with minerals Tabs tablet Take 1 tablet by mouth daily.   nitroGLYCERIN 0.4 MG SL tablet Commonly known as:  NITROSTAT Place 1 tablet (0.4 mg  total) under the tongue every 5 (five) minutes as needed for chest pain.   omeprazole 20 MG capsule Commonly known as:  PRILOSEC Take 1 capsule (20 mg total) by mouth daily.   oxycodone 30 MG immediate release tablet Commonly known as:  ROXICODONE Take 1 tablet (30 mg total) by mouth every 4 (four) hours as needed for severe pain.   pomalidomide 2 MG capsule Commonly known as:  POMALYST Take 1 capsule (2  mg total) by mouth daily. Take with water on days 1-21. Repeat every 28 days.   REFRESH OP Place 2 drops into both eyes 2 (two) times daily as needed (dry eyes).   traMADol 50 MG tablet Commonly known as:  ULTRAM Take 2 tablets (100 mg total) by mouth every 6 (six) hours as needed for moderate pain (one to two tablets every 6 hrs as needed.).   valsartan 160 MG tablet Commonly known as:  DIOVAN Take 1 tablet (160 mg total) by mouth daily.   valsartan 160 MG tablet Commonly known as:  DIOVAN Take 1 tablet (160 mg total) by mouth daily. Please call and schedule a 3 month follow up with Dr Johnsie Cancel per last office visit         Outstanding Labs/Studies   None.   Duration of Discharge Encounter   Greater than 30 minutes including physician time.  Signed, Angelena Form PA-C 03/17/2016, 1:20 PM  The patient has been seen in conjunction with Kathlene November, PAC. All aspects of care have been considered and discussed. The patient has been personally interviewed, examined, and all clinical data has been reviewed.  Plan:  1. With presumed prolonged anginal episode responsive to nitroglycerin, we will prescribe nitroglycerin and have discussed how to use it. 2. Aspirin daily, 81 mg 3. If recurring episodes of chest discomfort may need to add long-acting nitrates or other anti-ischemic therapy. 4. Plan discharge today 5. Cleared from the cardiac standpoint to pursue kyphoplasty. 6. Cardiology follow-up as needed. We have provided contact information if she begins having  recurrent symptoms. 7. Patient was made a limited code during this admission. Chest compressions and intubation should not be performed after discussing with the patient's power of attorney, daughter.

## 2016-03-17 NOTE — Telephone Encounter (Signed)
Received VM from someone named [place] at [facility].  She says pt is at [facility] now and asking if she is supposed to continue taking Dexamethasone 4 mg?   Per Dr. Alvy Bimler pt is to continue Dex 4 mg daily.  I called pt's daughter, April, to let her know.  April says pt questioned because this is her week off Pomalyst.  Informed April to continue Dex daily even though it is pt's week off per Dr. Alvy Bimler. April says pt was admitted for chest pains. She says pt diagnosed with "heart event" due to stress which she thinks is from pt's back pain.  April says she has not had any luck in getting new appt for back procedure w/ Dr. Estanislado Pandy since pt's WBC is up high enough for procedure now.  April is in waiting room Radiology at [facility] right now wanting to get pt scheduled back w/ Dr. Estanislado Pandy for Back procedure.  She asks if nurse can call on pt's behalf.  Informed her I will call office.  Called Dr. Arlean Hopping office and LVM requesting they contact pt's daughter, April. To schedule appt as soon as possible while pt's WBC up wnl now.  Also informed pt having a lot of pain.

## 2016-03-17 NOTE — Care Management Obs Status (Signed)
[facility] NOTIFICATION   Patient Details  Name: Natasha Jackson MRN: [id number] Date of Birth: 1925/07/23   Medicare Observation Status Notification Given:  Yes    Bethena Roys, RN 03/17/2016, 2:40 PM

## 2016-03-17 NOTE — Care Management Note (Signed)
Case Management Note  Patient Details  Name: EMILENE STARRETT MRN: [id number] Date of Birth: [birth year]  Subjective/Objective:  Pt presented for chest pain. Pt is from home with husband. Plan will be to return home. Pt states she has personal care assistance that she pays for out of pocket services.                   Action/Plan: CM did get a referral in regards to [employer]. CM did speak with pt in regards to she may want to pay out of pocket due to if she needs a Wheelchair in the future she may want insurance to pay for this. CM did state that [facility] may have [facility]ator that is affordable. Pt was appreciative of information. No further needs identified by CM at this time.   Expected Discharge Date:                  Expected Discharge Plan:  Home/Self Care  In-House Referral:  NA  Discharge planning Services  CM Consult  Post Acute Care Choice:  NA Choice offered to:  NA  DME Arranged:  N/A DME Agency:  NA  HH Arranged:  NA HH Agency:  NA  Status of Service:  Completed, signed off  If discussed at [facility] of Stay Meetings, dates discussed:    Additional Comments:  Bethena Roys, RN 03/17/2016, 2:40 PM

## 2016-03-17 NOTE — Telephone Encounter (Signed)
-----   Message from Candis Schatz sent at 03/17/2016  1:11 PM EDT ----- Regarding: TOC Has an appt to see Katie on the 19th. Needs phone call.  Thanks Trisha

## 2016-03-17 NOTE — Progress Notes (Addendum)
Patient Name: Natasha Jackson Date of Encounter: 03/17/2016    SUBJECTIVE: The patient feels better today. She is sitting at the bedside. Dyspnea has resolved. No recurrence of chest pain. The chest discomfort and dyspnea occurred in the setting of significant stress related to her home situation, the status of her husband who she has to take care of, and her medical illness that is causing increasing weakness and debility.  TELEMETRY:  Normal sinus rhythm without significant arrhythmia Vitals:   03/16/16 1531 03/16/16 1538 03/16/16 1937 03/17/16 0557  BP:  (!) 134/58 (!) 86/61 (!) 114/59  Pulse:  76 77 (!) 58  Resp:  19 (!) 22   Temp:  98.4 F (36.9 C) 98.9 F (37.2 C) 98 F (36.7 C)  TempSrc:  Oral Oral Oral  SpO2:  100% 97% 100%  Weight: 100 lb 1.4 oz (45.4 kg)   106 lb 6.4 oz (48.3 kg)  Height: _0  (1.626 m)       Intake/Output Summary (Last 24 hours) at 03/17/16 1148 Last data filed at 03/17/16 0810  Gross per 24 hour  Intake              840 ml  Output              750 ml  Net               90 ml   LABS: Basic Metabolic Panel:  Recent Labs  03/16/16 0935 03/17/16 0157  NA 128* 129*  K 4.3 4.2  CL 94* 95*  CO2 27 27  GLUCOSE 89 128*  BUN 15 14  CREATININE 0.66 0.65  CALCIUM 8.2* 7.9*   CBC:  Recent Labs  03/16/16 0935 03/17/16 0157  WBC 3.3* 3.9*  HGB 8.8* 8.7*  HCT 26.6* 26.2*  MCV 106.4* 105.6*  PLT 124* 132*   Cardiac Enzymes:  Recent Labs  03/16/16 1956 03/17/16 0157 03/17/16 0916  TROPONINI <0.03 <0.03 <0.03   BNP: Invalid input(s): POCBNP Hemoglobin A1C: No results for input(s): HGBA1C in the last 72 hours. Fasting Lipid Panel:  Recent Labs  03/17/16 0157  CHOL 158  HDL 84  LDLCALC 65  TRIG 46  CHOLHDL 1.9    Radiology/Studies:  No new data  Physical Exam: Blood pressure (!) 114/59, pulse (!) 58, temperature 98 F (36.7 C), temperature source Oral, resp. rate (!) 22, height _1  (1.626 m), weight 106 lb 6.4  oz (48.3 kg), SpO2 100 %. Weight change:   Wt Readings from Last 3 Encounters:  03/17/16 106 lb 6.4 oz (48.3 kg)  03/12/16 110 lb 9.6 oz (50.2 kg)  03/03/16 107 lb (48.5 kg)   Neck veins are flat. Significant kyphosis. Clear lung fields throughout the left and right chest. Cardiac exam reveals no gallop or murmur. Extremities reveal no edema. The neuro exam is intact.  ASSESSMENT:  1. Ischemic quality chest discomfort associated with dyspnea in the setting of heavy stress raises the question of Stress Cardiomyopathy. Absence of enzyme elevation goes against this diagnosis. Other potential explanations could be underlying coronary disease, transient coronary spasm, and clotting with reperfusion (very unlikely without enzyme bump). 2. Dyspnea with presumed acute on chronic combined systolic and diastolic heart failure. EF 45%. No clinical evidence of heart failure this morning. 3. Multiple myeloma with poor prognosis. 4. Compression fractures needing kyphoplasty. 5. Pancytopenia   Plan:  1. With presumed prolonged anginal episode responsive to nitroglycerin, we will prescribe nitroglycerin and  have discussed how to use it. 2. Aspirin daily, 81 mg 3. If recurring episodes of chest discomfort may need to add long-acting nitrates or other anti-ischemic therapy. 4. Plan discharge today 5. Cleared from the cardiac standpoint to pursue kyphoplasty. 6. Cardiology follow-up as needed. We have provided contact information if she begins having recurrent symptoms. 7. Patient was made a limited code during this admission. Chest compressions and intubation should not be performed after discussing with the patient's power of attorney, daughter.   Signed, Belva Crome III 03/17/2016, 11:48 AM

## 2016-03-18 ENCOUNTER — Other Ambulatory Visit: Payer: Self-pay | Admitting: Hematology and Oncology

## 2016-03-18 ENCOUNTER — Other Ambulatory Visit: Payer: Self-pay | Admitting: *Deleted

## 2016-03-18 ENCOUNTER — Other Ambulatory Visit: Payer: Self-pay | Admitting: Radiology

## 2016-03-18 ENCOUNTER — Telehealth: Payer: Self-pay | Admitting: *Deleted

## 2016-03-18 MED ORDER — ALPRAZOLAM 0.25 MG PO TABS: 0.2500 mg | ORAL_TABLET | Freq: Three times a day (TID) | ORAL | 0 refills | Status: DC | PRN

## 2016-03-18 NOTE — Telephone Encounter (Signed)
I spoke with the patient's daughter, April. I clarify with her the scheduling orders. I explained to her the reason that her appointment gets pushed another week is due to recent hospitalization. I recommend she proceed with her appointment with interventional radiologist and cardiologist as scheduled. We discussed management for anxiety and I will get my nursing staff to call in prescriptions Xanax for the patient to take as needed for anxiety. We discussed potential interaction with her current medications including excessive risk of sedation and increased risk of falls. I recommend return appointment on 03/31/2016 with blood work appointment first and to defer infusion treatment to 03/25/2016. I addressed all her questions and concerns

## 2016-03-18 NOTE — Telephone Encounter (Signed)
Called pt's home and s/w her daughter April.  Informed April Dr. Alvy Bimler ordered Labs and MD appt on 9/26, 45 minutes appt 1115 am to 12 pm.    April asks about pt's treatments,  Says she was scheduled for tx on 9/21?   Or will she get Tx on 9/26 instead?    April also says pt was supposed to get labs on 9/21 with the full myeloma panel.  Should pt have lab one week before she sees Dr. Alvy Bimler so she will have results? April also asks if Dr. Alvy Bimler can order something for pt's anxiety?  Says the Cardiologist felt pt's cardiac "event" was due to stress/ anxiety and she may need some medication to help her with this? Pt sees Cardiologist next week on 9/19.  Pt also has Appt for back procedure with Dr. Estanislado Pandy this Friday 9/15.

## 2016-03-18 NOTE — Telephone Encounter (Signed)
Patient contacted regarding discharge from  [facility] on 03/17/16 Patient understands to follow up with provider  -Yes.  Angelena Form, PA on 03/24/16 at 11:00 at [facility].  Patient understands discharge instructions? yes Patient understands medications and regiment?    yes Patient understands to bring all medications to this visit? Yes.

## 2016-03-19 ENCOUNTER — Ambulatory Visit: Payer: Medicare Other | Admitting: Hematology and Oncology

## 2016-03-19 ENCOUNTER — Other Ambulatory Visit: Payer: Self-pay | Admitting: Radiology

## 2016-03-19 ENCOUNTER — Other Ambulatory Visit (HOSPITAL_COMMUNITY): Payer: Self-pay | Admitting: Interventional Radiology

## 2016-03-19 DIAGNOSIS — M545 Low back pain, unspecified: Secondary | ICD-10-CM

## 2016-03-19 DIAGNOSIS — IMO0002 Reserved for concepts with insufficient information to code with codable children: Secondary | ICD-10-CM

## 2016-03-20 ENCOUNTER — Other Ambulatory Visit (HOSPITAL_COMMUNITY): Payer: Self-pay | Admitting: Interventional Radiology

## 2016-03-20 ENCOUNTER — Ambulatory Visit (HOSPITAL_COMMUNITY)
Admission: RE | Admit: 2016-03-20 | Discharge: 2016-03-20 | Disposition: A | Discharge status: Home / Self Care | Payer: Medicare Other | Source: Ambulatory Visit | Attending: Interventional Radiology | Admitting: Interventional Radiology

## 2016-03-20 ENCOUNTER — Encounter (HOSPITAL_COMMUNITY): Payer: Self-pay

## 2016-03-20 ENCOUNTER — Ambulatory Visit (HOSPITAL_COMMUNITY): Admit: 2016-03-20 | Payer: Medicare Other

## 2016-03-20 DIAGNOSIS — M545 Low back pain, unspecified: Secondary | ICD-10-CM

## 2016-03-20 DIAGNOSIS — IMO0002 Reserved for concepts with insufficient information to code with codable children: Secondary | ICD-10-CM

## 2016-03-20 DIAGNOSIS — E039 Hypothyroidism, unspecified: Secondary | ICD-10-CM | POA: Diagnosis not present

## 2016-03-20 DIAGNOSIS — Z8249 Family history of ischemic heart disease and other diseases of the circulatory system: Secondary | ICD-10-CM | POA: Diagnosis not present

## 2016-03-20 DIAGNOSIS — Z853 Personal history of malignant neoplasm of breast: Secondary | ICD-10-CM | POA: Insufficient documentation

## 2016-03-20 DIAGNOSIS — C9000 Multiple myeloma not having achieved remission: Secondary | ICD-10-CM | POA: Diagnosis not present

## 2016-03-20 DIAGNOSIS — G629 Polyneuropathy, unspecified: Secondary | ICD-10-CM | POA: Insufficient documentation

## 2016-03-20 DIAGNOSIS — Z823 Family history of stroke: Secondary | ICD-10-CM | POA: Diagnosis not present

## 2016-03-20 DIAGNOSIS — I351 Nonrheumatic aortic (valve) insufficiency: Secondary | ICD-10-CM | POA: Diagnosis not present

## 2016-03-20 DIAGNOSIS — F329 Major depressive disorder, single episode, unspecified: Secondary | ICD-10-CM | POA: Insufficient documentation

## 2016-03-20 DIAGNOSIS — M8458XA Pathological fracture in neoplastic disease, other specified site, initial encounter for fracture: Secondary | ICD-10-CM | POA: Insufficient documentation

## 2016-03-20 DIAGNOSIS — I2510 Atherosclerotic heart disease of native coronary artery without angina pectoris: Secondary | ICD-10-CM | POA: Diagnosis not present

## 2016-03-20 DIAGNOSIS — I10 Essential (primary) hypertension: Secondary | ICD-10-CM | POA: Insufficient documentation

## 2016-03-20 DIAGNOSIS — M4854XA Collapsed vertebra, not elsewhere classified, thoracic region, initial encounter for fracture: Secondary | ICD-10-CM | POA: Diagnosis present

## 2016-03-20 HISTORY — PX: IR GENERIC HISTORICAL: IMG1180011

## 2016-03-20 MED ORDER — IOPAMIDOL (ISOVUE-300) INJECTION 61%
INTRAVENOUS | Status: AC
  Administered 2016-03-20: 20 mL
  Filled 2016-03-20: qty 50

## 2016-03-20 MED ORDER — MIDAZOLAM HCL 2 MG/2ML IJ SOLN
INTRAMUSCULAR | Status: AC | PRN
  Administered 2016-03-20 (×2): 1 mg via INTRAVENOUS

## 2016-03-20 MED ORDER — FENTANYL CITRATE (PF) 100 MCG/2ML IJ SOLN
INTRAMUSCULAR | Status: AC | PRN
  Administered 2016-03-20 (×3): 25 ug via INTRAVENOUS

## 2016-03-20 MED ORDER — BUPIVACAINE HCL (PF) 0.25 % IJ SOLN
INTRAMUSCULAR | Status: AC
  Administered 2016-03-20: 10 mL
  Filled 2016-03-20: qty 30

## 2016-03-20 MED ORDER — SODIUM CHLORIDE 0.9 % IV SOLN: Freq: Once | INTRAVENOUS | Status: DC

## 2016-03-20 MED ORDER — BUPIVACAINE HCL (PF) 0.25 % IJ SOLN
INTRAMUSCULAR | Status: AC
  Administered 2016-03-20: 30 mL
  Filled 2016-03-20: qty 30

## 2016-03-20 MED ORDER — GELATIN ABSORBABLE 12-7 MM EX MISC
CUTANEOUS | Status: AC
  Administered 2016-03-20: 10:00:00
  Filled 2016-03-20: qty 1

## 2016-03-20 MED ORDER — MIDAZOLAM HCL 2 MG/2ML IJ SOLN
INTRAMUSCULAR | Status: AC
  Filled 2016-03-20: qty 2

## 2016-03-20 MED ORDER — SODIUM CHLORIDE 0.9 % IV SOLN
INTRAVENOUS | Status: DC
  Administered 2016-03-20: 11:00:00 via INTRAVENOUS

## 2016-03-20 MED ORDER — TOBRAMYCIN SULFATE 1.2 G IJ SOLR
INTRAMUSCULAR | Status: AC
  Administered 2016-03-20: 0.1 g
  Filled 2016-03-20: qty 1.2

## 2016-03-20 MED ORDER — FENTANYL CITRATE (PF) 100 MCG/2ML IJ SOLN
INTRAMUSCULAR | Status: AC
  Filled 2016-03-20: qty 2

## 2016-03-20 MED ORDER — CEFAZOLIN SODIUM-DEXTROSE 2-4 GM/100ML-% IV SOLN
INTRAVENOUS | Status: AC
  Filled 2016-03-20: qty 100

## 2016-03-20 MED ORDER — HYDROMORPHONE HCL 1 MG/ML IJ SOLN
INTRAMUSCULAR | Status: AC
  Filled 2016-03-20: qty 1

## 2016-03-20 MED ORDER — CEFAZOLIN SODIUM-DEXTROSE 2-4 GM/100ML-% IV SOLN
2.0000 g | INTRAVENOUS | Status: AC
  Administered 2016-03-20: 2 g via INTRAVENOUS

## 2016-03-20 NOTE — Sedation Documentation (Signed)
Patient is resting comfortably. 

## 2016-03-20 NOTE — Discharge Instructions (Signed)
1.No stooping,bending or lifting more than 10 lbs for 2 weeks. 2.Use walker top ambulate for 2 weeks. 3.RTC in 2 weeks PRN   KYPHOPLASTY/VERTEBROPLASTY DISCHARGE INSTRUCTIONS  Medications: (check all that apply)     Resume all home medications as before procedure.               Continue your pain medications as prescribed as needed.  Over the next 3-5 days, decrease your pain medication as tolerated.  Over the counter medications (i.e. Tylenol, ibuprofen, and aleve) may be substituted once severe/moderate pain symptoms have subsided.   Wound Care: - Bandages may be removed the day following your procedure.  You may get your incision wet once bandages are removed.  Bandaids may be used to cover the incisions until scab formation.  Topical ointments are optional.  - If you develop a fever greater than 101 degrees, have increased skin redness at the incision sites or pus-like oozing from incisions occurring within 1 week of the procedure, contact radiology at [phone] or [phone].  - Ice pack to back for 15-20 minutes 2-3 time per day for first 2-3 days post procedure.  The ice will expedite muscle healing and help with the pain from the incisions.   Activity: - Bedrest today with limited activity for 24 hours post procedure.  - No driving for 48 hours.  - Increase your activity as tolerated after bedrest (with assistance if necessary).  - Refrain from any strenuous activity or heavy lifting (greater than 10 lbs.).   Follow up: - Contact radiology at [phone] or [phone] if any questions/concerns.  - A physician assistant from radiology will contact you in approximately 1 week.  - If a biopsy was performed at the time of your procedure, your referring physician should receive the results in usually 2-3 days.

## 2016-03-20 NOTE — H&P (Signed)
Chief Complaint: T10,11 compression fractures  Referring Physician: Dr. Heath Lark  Supervising Physician: Luanne Bras  Patient Status:  Out-pt  HPI: Natasha Jackson is an 82 y.o. female with progressive multiple myeloma.  She has T10,11 compression fractures that are causing significant pain.  She was brought in 2 weeks ago for treatment, but her WBC were too low to proceed.  She has been rescheduled for today.  She did wake up Monday morning with chest pressure/pain and SOB.  She was admitted overnight for diuresis by cardiology, but no MI was felt to be had.  She was cleared by cardiology as that time for this procedure.  No further changes to her medical history.  Past Medical History:  Past Medical History:  Diagnosis Date  . Aortic regurgitation   . Breast cancer ([place])   . Coronary artery disease   . DCIS (ductal carcinoma in situ) 08/04/2012  . Depression 09/05/2014  . Dysphagia 01/23/2016  . Hot flash not due to menopause 12/23/2011  . HTN (hypertension)   . Hypothyroidism 10/18/2014  . Multiple myeloma   . Multiple myeloma ([place]) 10/11/2015  . Neuropathy ([place]) 12/23/2011  . Spinal fracture of T12 vertebra ([facility])    vertebral fx    Past Surgical History:  Past Surgical History:  Procedure Laterality Date  . INCISIONAL HERNIA REPAIR    . lymph node excision - left groin      Family History:  Family History  Problem Relation Age of Onset  . Stroke Father   . Heart failure Mother     Social History:  reports that she has never smoked. She has never used smokeless tobacco. She reports that she does not drink alcohol or use drugs.  Allergies:  Allergies  Allergen Reactions  . No Known Allergies     Medications: Medications have been reviewed in Epic  Please HPI for pertinent positives, otherwise complete 10 system ROS negative, except some low back pain at the top of her buttocks.  Mallampati Score: MD Evaluation Airway: WNL Heart: WNL Abdomen:  WNL Chest/ Lungs: WNL ASA  Classification: 3 Mallampati/Airway Score: Two  Physical Exam: BP (!) 147/43   Pulse (!) 57   Temp 98.1 F (36.7 C) (Oral)   Resp 18   Ht 5' 4"  (1.626 m)   Wt 106 lb (48.1 kg)   SpO2 95%   BMI 18.19 kg/m  Body mass index is 18.19 kg/m. General: pleasant, elderly white female who is laying in bed in NAD HEENT: head is normocephalic, atraumatic.  Sclera are noninjected.  PERRL.  Ears and nose without any masses or lesions.  Mouth is pink and moist Heart: regular, rate, and rhythm.  Normal s1,s2. No obvious murmurs, gallops, or rubs noted.  Palpable radial and pedal pulses bilaterally Lungs: CTAB, no wheezes, rhonchi, or rales noted.  Respiratory effort nonlabored Abd: soft, NT, ND, +BS, no masses, hernias, or organomegaly Psych: A&Ox3 with an appropriate affect.   Labs: No results found for this or any previous visit (from the past 48 hour(s)).  Imaging: No results found.  Assessment/Plan 1. Pathologic T10,11 compression fractures secondary to multiple myeloma -we will proceed today with VP vs KP of these two levels. -she just had labs drawn 3 days ago and those have been reviewed.  No new labs are needed today. -Risks and Benefits discussed with the patient including, but not limited to education regarding the natural healing process of compression fractures without intervention, bleeding, infection, cement migration which  may cause spinal cord damage, paralysis, pulmonary embolism or even death. All of the patient's questions were answered, patient is agreeable to proceed. Consent signed and in chart.   Thank you for this interesting consult.  I greatly enjoyed meeting KYNZLEY DOWSON and look forward to participating in their care.  A copy of this report was sent to the requesting provider on this date.  Electronically Signed: Henreitta Cea 03/20/2016, 8:14 AM   I spent a total of    25 Minutes in face to face in clinical consultation, greater  than 50% of which was counseling/coordinating care for T10,11 compression fractures

## 2016-03-20 NOTE — Procedures (Signed)
S/P T 10 and T 11 VP

## 2016-03-22 NOTE — Progress Notes (Signed)
Cardiology Office Note    Date:  03/24/2016   ID:  Natasha Jackson, DOB [birth year], MRN [id number]  PCP:  Thressa Sheller, MD  Cardiologist:  Dr. Johnsie Cancel  CC: post hosp fu   History of Present Illness:  Natasha Jackson is a 82 y.o. female with a history of HTN, multiple myeloma with current relapse, hx of breast cancer s/p lumpectomy/chemo/XRT, mild LV dysfunction w/ no previous clinical CHF (45-50%), modAR/mildMR, hypothyroidism, bradycardia and LBBB who presents to clinic for post hospital follow up after a recent admission for chest pain and mild CHF.   She saw Dr. Stanford Breed in 09/2012 for evaluation of bradycardia and dizziness. Holter monitor in 09/2012 showed SR with PAC's and PVC's; mean HR 62 with min rate early in am 46 with sinus bradycaria no AV block. Dr. Stanford Breed noted that because of her MM, she would be at risk for amyloidosis as a possible contributor to her conduction disease.   2D ECHO 10/2013 showed EF 40-45% w/ diffuse HK, G1DD, mild AR, mild-mod MR, lipomatous hypertrophy of atrial septum.   She was seen in the hospital by Dr Mare Ferrari 3/16/16for atypical chest pain after a fracture.Myovue at that time was normal w/ EF 64%.  She called the office on 10/17/15 about multiple episodes of dizziness. She thought that it was due to her valsartan and stopped it. Her blood pressure then increased. She was encouraged to resume her valsartan and was added on to my clinic schedule. I felt like her sx we c/w vertigo and put her back on Valsartan. Repeat 2D ECHO 11/2015 showed EF improved a little, EF 45-50%. Mild-mod AR, mild MR, mildly dilated asc aortic diameter. She was then in remission of her MM with asymptomatic vertebral compression fractures at the time.  Since that time she has developed a relapse in her MM and has new painful compression fractures at T10 and 11. She was scheduled for vertebroplasty/kyphoplasty with interventional radiology. Her kyphoplasty appointment was  delayed due to pancytopenia (medication SE ofPomalidomide). She takes large amounts of opoids for pain control. She has also lost significant weight. She was seen by Dr. Alvy Bimler on 03/12/16 and her Losartan was cut in half due to dizziness and weakness.   She was in her usual state of health until 03/16/16 when when she was awoken from sleep with chest pressure in the center of her chest. She said her legs were a little swollen which was unusual for her and she had some mild SOB and orthopnea. Her chest pain was slightly relieved by SL NTG but then completely resolved. In the ER her BNP was mildly elevated and CXR with pulmonary vascular congestion. It was decided to admit her for light diuresis and continued observation. She was admitted with neutropenia precautions. She felt much better the following AM and she was discharged home with SL NTG. Per Dr. Tamala Julian chest pain could be ischemia vs stress induced CM vs CHF. Palliative measures were chosen given her severe debilitation 2/2 progressive multiple myeloma. Her code status was addressed during this admission and she was made a limited code.  Today she presents to clinic for follow up. She has had back surgery on Friday with a vertebroplasty to T10 and T 11. She was put under for general anaesthesia and did well. Yesterday she had some significant chest pain that radiated to her back. It lasted only about 10 minutes and resolved on its own . She did not try any SLNTG. It was so severe that  she almost called 911. She took an extra dose of her morphine and it eventually completely resolved. Some LE edema that has come back since being admitted. No orthopnea or PND. She has chronic dizziness but no syncope.     Past Medical History:  Diagnosis Date  . Aortic regurgitation   . Breast cancer ([place])   . Coronary artery disease   . DCIS (ductal carcinoma in situ) 08/04/2012  . Depression 09/05/2014  . Dysphagia 01/23/2016  . Hot flash not due to menopause  12/23/2011  . HTN (hypertension)   . Hypothyroidism 10/18/2014  . Multiple myeloma   . Multiple myeloma ([place]) 10/11/2015  . Neuropathy ([place]) 12/23/2011  . Spinal fracture of T12 vertebra ([facility])    vertebral fx    Past Surgical History:  Procedure Laterality Date  . INCISIONAL HERNIA REPAIR    . IR GENERIC HISTORICAL  03/20/2016   IR VERTEBROPLASTY EA ADDL (T&LS) BX INC UNI/BIL INC INJECT/IMAGING 03/20/2016 Luanne Bras, MD [facility]  . IR GENERIC HISTORICAL  03/20/2016   IR VERTEBROPLASTY CERV/THOR BX INC UNI/BIL INC/INJECT/IMAGING 03/20/2016 Luanne Bras, MD [facility]  . lymph node excision - left groin      Current Medications: Outpatient Medications Prior to Visit  Medication Sig Dispense Refill  . ALPRAZolam (XANAX) 0.25 MG tablet Take 1 tablet (0.25 mg total) by mouth 3 (three) times daily as needed for anxiety. 30 tablet 0  . aspirin EC 81 MG EC tablet Take 1 tablet (81 mg total) by mouth daily.    . cholecalciferol (VITAMIN D) 1000 UNITS tablet Take 1,000 Units by mouth daily.    [name] levothyroxine (SYNTHROID, LEVOTHROID) 50 MCG tablet Take 50 mcg by mouth daily before breakfast.    . morphine (MS CONTIN) 60 MG 12 hr tablet Take 1 tablet (60 mg total) by mouth every 12 (twelve) hours. 60 tablet 0  . Multiple Vitamin (MULTIVITAMIN WITH MINERALS) TABS tablet Take 1 tablet by mouth daily.    . nitroGLYCERIN (NITROSTAT) 0.4 MG SL tablet Place 1 tablet (0.4 mg total) under the tongue every 5 (five) minutes as needed for chest pain. 25 tablet 11  . oxyCODONE (ROXICODONE) 30 MG immediate release tablet Take 1 tablet (30 mg total) by mouth every 4 (four) hours as needed for severe pain. 90 tablet 0  . Polyvinyl Alcohol-Povidone (REFRESH OP) Place 2 drops into both eyes 2 (two) times daily as needed (dry eyes).    . pomalidomide (POMALYST) 2 MG capsule Take 1 capsule (2 mg total) by mouth daily. Take with water on days 1-21. Repeat every 28 days. 21 capsule 0  . valsartan (DIOVAN)  160 MG tablet Take 1 tablet (160 mg total) by mouth daily. 30 tablet 3  . dexamethasone (DECADRON) 4 MG tablet Take 2 tablets daily (8 mg) (Patient taking differently: Take 4 mg by mouth daily. ) 60 tablet 2  . gabapentin (NEURONTIN) 300 MG capsule TAKE ONE CAPSULE BY MOUTH THREE TIMES DAILY (Patient taking differently: Take 300 mg by mouth 3 times daily) 90 capsule 3  . mirtazapine (REMERON) 7.5 MG tablet TAKE ONE TABLET BY MOUTH NIGHTLY AT BEDTIME (Patient taking differently: Take 7.5 mg by mouth at bedtime) 30 tablet 3   No facility-administered medications prior to visit.      Allergies:   No known allergies   Social History   Social History  . Marital status: Married    Spouse name: N/A  . Number of children: N/A  . Years of education:  N/A   Social History Main Topics  . Smoking status: Never Smoker  . Smokeless tobacco: Never Used  . Alcohol use No  . Drug use: No  . Sexual activity: No   Other Topics Concern  . None   Social History Narrative  . None     Family History:  The patient's family history includes Heart failure in her mother; Stroke in her father.     ROS:   Please see the history of present illness.    ROS All other systems reviewed and are negative.   PHYSICAL EXAM:   VS:  BP (!) 112/56   Pulse 88   Ht 5' 4"  (1.626 m)   Wt 114 lb 6.4 oz (51.9 kg)   SpO2 92%   BMI 19.64 kg/m    GEN: Well nourished, well developed, in no acute distress, thin chronically ill appearing.  HEENT: normal  Neck: no JVD, carotid bruits, or masses Cardiac: RRR; no murmurs, rubs, or gallops, 1+ LE edema  Respiratory:  clear to auscultation bilaterally, normal work of breathing GI: soft, nontender, nondistended, + BS MS: no deformity or atrophy  Skin: warm and dry, no rash Neuro:  Alert and Oriented x 3, Strength and sensation are intact Psych: euthymic mood, full affect  Wt Readings from Last 3 Encounters:  03/24/16 114 lb 6.4 oz (51.9 kg)  03/20/16 106 lb (48.1  kg)  03/17/16 106 lb 6.4 oz (48.3 kg)      Studies/Labs Reviewed:   EKG:  EKG is NOT ordered today.   Recent Labs: 03/16/2016: B Natriuretic Peptide 150.0; TSH 1.734 03/17/2016: ALT 18; BUN 14; Creatinine, Ser 0.65; Hemoglobin 8.7; Platelets 132; Potassium 4.2; Sodium 129   Lipid Panel    Component Value Date/Time   CHOL 158 03/17/2016 0157   TRIG 46 03/17/2016 0157   HDL 84 03/17/2016 0157   CHOLHDL 1.9 03/17/2016 0157   VLDL 9 03/17/2016 0157   LDLCALC 65 03/17/2016 0157    Additional studies/ records that were reviewed today include:  2D ECHO: 11/26/2015 LV EF: 45% - 50% Study Conclusions - Left ventricle: The cavity size was normal. Systolic function was mildly reduced. The estimated ejection fraction was in the range of 45% to 50%. Wall motion was normal; there were no regional wall motion abnormalities. There was an increased relative contribution of atrial contraction to ventricular filling. Doppler parameters are consistent with abnormal left ventricular relaxation (grade 1 diastolic dysfunction). - Ventricular septum: Septal motion showed moderate paradox. These changes are consistent with intraventricular conduction delay. - Aortic valve: Moderate diffuse thickening and calcification. There was mild to moderate regurgitation. - Aorta: Ascending aortic diameter: 39 mm (S). - Ascending aorta: The ascending aorta was mildly dilated. - Mitral valve: There was mild regurgitation.  Myoview: 09/2014 IMPRESSION: 1. No reversible ischemia or infarction. 2. Normal left ventricular wall motion. 3. Left ventricular ejection fraction 64% 4. Low-risk stress test findings*.   ASSESSMENT & PLAN:   Acute on chronic combined S/D CHF: 2D ECHO 11/2015 showed EF improved a little, EF 45-50%. Mild-mod AR, mild MR, mildly dilated asc aortic diameter.  -- She appears euvolemic but has some mild  LE edema. Will give her lasix 20m to take PRN.  -- Continue  valsartan. No BB with a history of bradycardia.   Chest pain: had one episode since discharge but didn't take SL NTG. She has SL NTG should this return and I have encouraged her to try it  If recurring episodes of  nitrate responsive chest discomfort we may need to add long-acting nitrates or other anti-ischemic therapy. Will continue her on ASA 73m daily.   Multiple myeloma w/ relapse and vertebral compression fractures: followed closely by Dr. GAlvy Bimlerand with a poor prognosis. She recently had vertobrplasty last Friday and doing well after this.    HTN: with relatively low BPs recent requiring de escalation of her ARB   Mod AR/mild MR: stable on most recent echo.   LBBB: stable.   Hypothyroidism: continue synthroid   Pancytopenia: related to MM medications  Hx of breast cancer: in remission.   Chronic pain: she will require continuation of her narcotic regimen  Dispo: Patient was made a limited code during most recent admission. Chest compressions and intubation should not be performed after discussing with the patient's power of attorney, her daughter.    Medication Adjustments/Labs and Tests Ordered: Current medicines are reviewed at length with the patient today.  Concerns regarding medicines are outlined above.  Medication changes, Labs and Tests ordered today are listed in the Patient Instructions below. Patient Instructions  Medication Instructions:  Your physician has recommended you make the following change in your medication:  1.  START Lasix 20 mg taking AS NEEDED for swelling / edema   Labwork: None ordered  Testing/Procedures: None ordered  Follow-Up: Your physician recommends that you schedule a follow-up appointment in: 3 MONTHS WITH DR. NJohnsie Cancel   Any Other Special Instructions Will Be Listed Below (If Applicable).    If you need a refill on your cardiac medications before your next appointment, please call your pharmacy.       SMable Fill PA-C  03/24/2016 12:03 PM    [address] [place]  [id number]: [phone] Fax: [phone]

## 2016-03-23 ENCOUNTER — Encounter (HOSPITAL_COMMUNITY): Payer: Self-pay | Admitting: Interventional Radiology

## 2016-03-24 ENCOUNTER — Ambulatory Visit (INDEPENDENT_AMBULATORY_CARE_PROVIDER_SITE_OTHER): Payer: Medicare Other | Admitting: Physician Assistant

## 2016-03-24 ENCOUNTER — Other Ambulatory Visit (HOSPITAL_COMMUNITY): Payer: Self-pay | Admitting: Interventional Radiology

## 2016-03-24 ENCOUNTER — Emergency Department (HOSPITAL_COMMUNITY): Payer: Medicare Other

## 2016-03-24 ENCOUNTER — Encounter: Payer: Self-pay | Admitting: Physician Assistant

## 2016-03-24 ENCOUNTER — Inpatient Hospital Stay (HOSPITAL_COMMUNITY)
Admission: EM | Admit: 2016-03-24 | Discharge: 2016-03-29 | DRG: 175 | Disposition: A | Discharge status: Home Health | Payer: Medicare Other | Attending: Internal Medicine | Admitting: Internal Medicine

## 2016-03-24 ENCOUNTER — Encounter (HOSPITAL_COMMUNITY): Payer: Self-pay | Admitting: Emergency Medicine

## 2016-03-24 ENCOUNTER — Telehealth (HOSPITAL_COMMUNITY): Payer: Self-pay

## 2016-03-24 VITALS — BP 112/56 | HR 88 | Ht 64.0 in | Wt 114.4 lb

## 2016-03-24 DIAGNOSIS — Z7982 Long term (current) use of aspirin: Secondary | ICD-10-CM

## 2016-03-24 DIAGNOSIS — G629 Polyneuropathy, unspecified: Secondary | ICD-10-CM | POA: Diagnosis present

## 2016-03-24 DIAGNOSIS — M546 Pain in thoracic spine: Secondary | ICD-10-CM | POA: Diagnosis present

## 2016-03-24 DIAGNOSIS — I5022 Chronic systolic (congestive) heart failure: Secondary | ICD-10-CM | POA: Diagnosis present

## 2016-03-24 DIAGNOSIS — K5903 Drug induced constipation: Secondary | ICD-10-CM | POA: Diagnosis present

## 2016-03-24 DIAGNOSIS — I447 Left bundle-branch block, unspecified: Secondary | ICD-10-CM

## 2016-03-24 DIAGNOSIS — IMO0001 Reserved for inherently not codable concepts without codable children: Secondary | ICD-10-CM

## 2016-03-24 DIAGNOSIS — E079 Disorder of thyroid, unspecified: Secondary | ICD-10-CM | POA: Diagnosis present

## 2016-03-24 DIAGNOSIS — R079 Chest pain, unspecified: Secondary | ICD-10-CM | POA: Diagnosis present

## 2016-03-24 DIAGNOSIS — D61810 Antineoplastic chemotherapy induced pancytopenia: Secondary | ICD-10-CM | POA: Diagnosis present

## 2016-03-24 DIAGNOSIS — I110 Hypertensive heart disease with heart failure: Secondary | ICD-10-CM | POA: Diagnosis present

## 2016-03-24 DIAGNOSIS — R42 Dizziness and giddiness: Secondary | ICD-10-CM

## 2016-03-24 DIAGNOSIS — G8929 Other chronic pain: Secondary | ICD-10-CM | POA: Diagnosis present

## 2016-03-24 DIAGNOSIS — T451X5A Adverse effect of antineoplastic and immunosuppressive drugs, initial encounter: Secondary | ICD-10-CM | POA: Diagnosis present

## 2016-03-24 DIAGNOSIS — I2510 Atherosclerotic heart disease of native coronary artery without angina pectoris: Secondary | ICD-10-CM | POA: Diagnosis present

## 2016-03-24 DIAGNOSIS — Z79891 Long term (current) use of opiate analgesic: Secondary | ICD-10-CM

## 2016-03-24 DIAGNOSIS — E039 Hypothyroidism, unspecified: Secondary | ICD-10-CM

## 2016-03-24 DIAGNOSIS — F419 Anxiety disorder, unspecified: Secondary | ICD-10-CM | POA: Diagnosis present

## 2016-03-24 DIAGNOSIS — I38 Endocarditis, valve unspecified: Secondary | ICD-10-CM | POA: Diagnosis not present

## 2016-03-24 DIAGNOSIS — T402X5A Adverse effect of other opioids, initial encounter: Secondary | ICD-10-CM | POA: Diagnosis present

## 2016-03-24 DIAGNOSIS — E440 Moderate protein-calorie malnutrition: Secondary | ICD-10-CM | POA: Diagnosis present

## 2016-03-24 DIAGNOSIS — IMO0002 Reserved for concepts with insufficient information to code with codable children: Secondary | ICD-10-CM

## 2016-03-24 DIAGNOSIS — Z853 Personal history of malignant neoplasm of breast: Secondary | ICD-10-CM | POA: Diagnosis not present

## 2016-03-24 DIAGNOSIS — Z515 Encounter for palliative care: Secondary | ICD-10-CM

## 2016-03-24 DIAGNOSIS — I351 Nonrheumatic aortic (valve) insufficiency: Secondary | ICD-10-CM | POA: Diagnosis present

## 2016-03-24 DIAGNOSIS — I2699 Other pulmonary embolism without acute cor pulmonale: Principal | ICD-10-CM | POA: Diagnosis present

## 2016-03-24 DIAGNOSIS — I10 Essential (primary) hypertension: Secondary | ICD-10-CM | POA: Diagnosis not present

## 2016-03-24 DIAGNOSIS — Z823 Family history of stroke: Secondary | ICD-10-CM

## 2016-03-24 DIAGNOSIS — D630 Anemia in neoplastic disease: Secondary | ICD-10-CM | POA: Diagnosis present

## 2016-03-24 DIAGNOSIS — R609 Edema, unspecified: Secondary | ICD-10-CM | POA: Diagnosis not present

## 2016-03-24 DIAGNOSIS — M545 Low back pain: Secondary | ICD-10-CM | POA: Diagnosis not present

## 2016-03-24 DIAGNOSIS — M4850XD Collapsed vertebra, not elsewhere classified, site unspecified, subsequent encounter for fracture with routine healing: Secondary | ICD-10-CM

## 2016-03-24 DIAGNOSIS — Z7952 Long term (current) use of systemic steroids: Secondary | ICD-10-CM

## 2016-03-24 DIAGNOSIS — Z681 Body mass index (BMI) 19 or less, adult: Secondary | ICD-10-CM | POA: Diagnosis not present

## 2016-03-24 DIAGNOSIS — F329 Major depressive disorder, single episode, unspecified: Secondary | ICD-10-CM | POA: Diagnosis present

## 2016-03-24 DIAGNOSIS — Z87311 Personal history of (healed) other pathological fracture: Secondary | ICD-10-CM

## 2016-03-24 DIAGNOSIS — C9002 Multiple myeloma in relapse: Secondary | ICD-10-CM

## 2016-03-24 DIAGNOSIS — R0602 Shortness of breath: Secondary | ICD-10-CM | POA: Diagnosis not present

## 2016-03-24 DIAGNOSIS — J9601 Acute respiratory failure with hypoxia: Secondary | ICD-10-CM | POA: Diagnosis present

## 2016-03-24 DIAGNOSIS — Z8249 Family history of ischemic heart disease and other diseases of the circulatory system: Secondary | ICD-10-CM

## 2016-03-24 DIAGNOSIS — M4850XA Collapsed vertebra, not elsewhere classified, site unspecified, initial encounter for fracture: Secondary | ICD-10-CM | POA: Diagnosis present

## 2016-03-24 DIAGNOSIS — Z79899 Other long term (current) drug therapy: Secondary | ICD-10-CM

## 2016-03-24 LAB — BASIC METABOLIC PANEL
Anion gap: 8 (ref 5–15)
BUN: 27 mg/dL — AB (ref 6–20)
CALCIUM: 8.5 mg/dL — AB (ref 8.9–10.3)
CO2: 28 mmol/L (ref 22–32)
CREATININE: 0.76 mg/dL (ref 0.44–1.00)
Chloride: 95 mmol/L — ABNORMAL LOW (ref 101–111)
GFR calc non Af Amer: >60 mL/min (ref >60)
GLUCOSE: 117 mg/dL — AB (ref 65–99)
Potassium: 4.4 mmol/L (ref 3.5–5.1)
Sodium: 131 mmol/L — ABNORMAL LOW (ref 135–145)

## 2016-03-24 LAB — APTT: aPTT: 34 seconds (ref 24–36)

## 2016-03-24 LAB — CBC
HEMATOCRIT: 26.6 % — AB (ref 36.0–46.0)
Hemoglobin: 9 g/dL — ABNORMAL LOW (ref 12.0–15.0)
MCH: 35.7 pg — AB (ref 26.0–34.0)
MCHC: 33.8 g/dL (ref 30.0–36.0)
MCV: 105.6 fL — AB (ref 78.0–100.0)
Platelets: 215 10*3/uL (ref 150–400)
RBC: 2.52 MIL/uL — ABNORMAL LOW (ref 3.87–5.11)
RDW: 16.5 % — AB (ref 11.5–15.5)
WBC: 2.5 10*3/uL — ABNORMAL LOW (ref 4.0–10.5)

## 2016-03-24 LAB — PROTIME-INR
INR: 1.06
Prothrombin Time: 13.8 seconds (ref 11.4–15.2)

## 2016-03-24 LAB — I-STAT TROPONIN, ED
Troponin i, poc: 0.02 ng/mL (ref 0.00–0.08)
Troponin i, poc: 0.01 ng/mL (ref 0.00–0.08)

## 2016-03-24 MED ORDER — HEPARIN (PORCINE) IN NACL 100-0.45 UNIT/ML-% IJ SOLN
800.0000 [IU]/h | INTRAMUSCULAR | Status: DC
  Administered 2016-03-25 – 2016-03-26 (×2): 800 [IU]/h via INTRAVENOUS
  Filled 2016-03-24 (×2): qty 250

## 2016-03-24 MED ORDER — HEPARIN BOLUS VIA INFUSION
1500.0000 [IU] | Freq: Once | INTRAVENOUS | Status: AC
  Administered 2016-03-25: 1500 [IU] via INTRAVENOUS
  Filled 2016-03-24: qty 1500

## 2016-03-24 MED ORDER — IOPAMIDOL (ISOVUE-370) INJECTION 76%
100.0000 mL | Freq: Once | INTRAVENOUS | Status: AC | PRN
  Administered 2016-03-24: 71.4 mL via INTRAVENOUS

## 2016-03-24 MED ORDER — FUROSEMIDE 20 MG PO TABS: ORAL_TABLET | ORAL | 3 refills | Status: DC

## 2016-03-24 MED ORDER — HYDROMORPHONE HCL 1 MG/ML IJ SOLN
1.0000 mg | Freq: Once | INTRAMUSCULAR | Status: AC
  Administered 2016-03-25: 1 mg via INTRAVENOUS
  Filled 2016-03-24: qty 1

## 2016-03-24 NOTE — ED Provider Notes (Signed)
[facility] DEPT Provider Note   CSN: [id number] Arrival date & time: 03/24/16  2003     History   Chief Complaint Chief Complaint  Patient presents with  . Shortness of Breath    HPI Natasha Jackson is a 82 y.o. female.  HPI  Patient is a 82 year old female with a history of aortic regurgitation, coronary artery disease, hypertension and multiple myeloma with current relapse, hx of breast cancer s/p lumpectomy/chemo/XRT, mild LV dysfunction w/ no previous clinical CHF (45-50%), modAR/mildMR, hypothyroidism, bradycardia and LBBB. Notes increased SOB x 2 hours ago and chest pain. Seen by Cardiology today for follow up from admission on 03/16/16 due to similar event. During that time, pt awoek from sleep with pressure in chest with SOB. Slighty relief with NTG at tha ttime. Admitted for CHF exacerbation due to elevated BNP and pulmonary vascular congestion on CXR. Felt better the following AM. Likely ischemia vs stress induced CM vs CHF. Palliative measures were chosen given her severe debilitation 2/2 progressive multiple myeloma. Her code status was addressed during that admission and she was made a limited code. Of note, pt had recent vertebroplasty to T10 and T 11 due to compression fractures along T10, T11 that cause her significant pain due to Multiple Myeloma. Pt presents with concern for cardiac event today with similar presentation as before. No fevers. No abd pain. No N/V/D. Notes chest pain currently 4/10 and centrally located. No other symptoms noted.   Oncologist- Dr. Estanislado Pandy Cardiologist- Dr. Johnsie Cancel  Past Medical History:  Diagnosis Date  . Aortic regurgitation   . Breast cancer ([place])   . Coronary artery disease   . DCIS (ductal carcinoma in situ) 08/04/2012  . Depression 09/05/2014  . Dysphagia 01/23/2016  . Hot flash not due to menopause 12/23/2011  . HTN (hypertension)   . Hypothyroidism 10/18/2014  . Multiple myeloma   . Multiple myeloma ([place]) 10/11/2015  . Neuropathy  ([place]) 12/23/2011  . Spinal fracture of T12 vertebra ([facility])    vertebral fx    Patient Active Problem List   Diagnosis Date Noted  . Dizziness 03/12/2016  . Quality of life palliative care encounter 02/07/2016  . Dysphagia 01/23/2016  . Protein-calorie malnutrition, moderate ([place]) 01/23/2016  . GERD (gastroesophageal reflux disease) 01/09/2016  . Anemia in neoplastic disease 01/09/2016  . Constipation due to opioid therapy 10/21/2015  . Multiple myeloma ([place]) 10/11/2015  . Chronic back pain greater than 3 months duration 09/26/2015  . Left leg pain 11/22/2014  . Rib pain on left side 11/22/2014  . Hypothyroidism 10/18/2014  . Neutropenia ([place]) 09/20/2014  . Chest pain 09/20/2014  . Depression 09/05/2014  . Arthropathy   . Dehydration, moderate 08/01/2014  . Vertebral compression fracture ([place]) 07/19/2014  . Pancytopenia due to antineoplastic chemotherapy ([place]) 07/05/2014  . Rib pain on right side 06/18/2014  . Bradycardia 09/22/2012  . DCIS (ductal carcinoma in situ) 08/04/2012  . Hot flash not due to menopause 12/23/2011  . Neuropathy due to chemotherapeutic drug ([place]) 12/23/2011  . OTHER SPECIFIED IDIOPATHIC PERIPHERAL NEUROPATHY 06/25/2010  . LBBB (left bundle branch block) 06/05/2009  . Multiple myeloma in relapse ([place]) 01/04/2009  . Essential hypertension, benign 01/04/2009  . DERMATITIS 01/03/2009  . ARTHRITIS 01/03/2009  . POSTMENOPAUSAL STATUS 01/03/2009    Past Surgical History:  Procedure Laterality Date  . INCISIONAL HERNIA REPAIR    . IR GENERIC HISTORICAL  03/20/2016   IR VERTEBROPLASTY EA ADDL (T&LS) BX INC UNI/BIL INC INJECT/IMAGING 03/20/2016 Luanne Bras, MD [facility]  .  IR GENERIC HISTORICAL  03/20/2016   IR VERTEBROPLASTY CERV/THOR BX INC UNI/BIL INC/INJECT/IMAGING 03/20/2016 Luanne Bras, MD [facility]  . lymph node excision - left groin      OB History    No data available       Home Medications    Prior to Admission medications    Medication Sig Start Date End Date Taking? Authorizing Provider  acyclovir (ZOVIRAX) 400 MG tablet Take 1 tablet by mouth daily. 12/24/15   Historical Provider, MD  ALPRAZolam Duanne Moron) 0.25 MG tablet Take 1 tablet (0.25 mg total) by mouth 3 (three) times daily as needed for anxiety. 03/18/16   Heath Lark, MD  Artificial Tear Ointment (ARTIFICIAL TEARS) ointment Apply 1 drop to eye as needed (dry eyes).     Historical Provider, MD  aspirin EC 81 MG EC tablet Take 1 tablet (81 mg total) by mouth daily. 03/17/16   Eileen Stanford, PA-C  cholecalciferol (VITAMIN D) 1000 UNITS tablet Take 1,000 Units by mouth daily. 05/01/13   Heath Lark, MD  cyclobenzaprine (FLEXERIL) 5 MG tablet Take 1 tablet by mouth daily. 12/16/15   Historical Provider, MD  dexamethasone (DECADRON) 4 MG tablet Take 2 tablets (80 mg) by mouth daily    Historical Provider, MD  furosemide (LASIX) 20 MG tablet Take 1 tablet daily as needed for swelling 03/24/16   Eileen Stanford, PA-C  gabapentin (NEURONTIN) 300 MG capsule Take 300 mg by mouth 3 (three) times daily.    Historical Provider, MD  levothyroxine (SYNTHROID, LEVOTHROID) 50 MCG tablet Take 50 mcg by mouth daily before breakfast.    Historical Provider, MD  mirtazapine (REMERON) 7.5 MG tablet Take 7.5 mg by mouth at bedtime.    Historical Provider, MD  morphine (MS CONTIN) 60 MG 12 hr tablet Take 1 tablet (60 mg total) by mouth every 12 (twelve) hours. 03/12/16   Heath Lark, MD  Multiple Vitamin (MULTIVITAMIN WITH MINERALS) TABS tablet Take 1 tablet by mouth daily.    Historical Provider, MD  nitroGLYCERIN (NITROSTAT) 0.4 MG SL tablet Place 1 tablet (0.4 mg total) under the tongue every 5 (five) minutes as needed for chest pain. 03/17/16   Josue Hector, MD  omeprazole (PRILOSEC) 20 MG capsule Take 20 mg by mouth daily. 01/09/16   Historical Provider, MD  oxyCODONE (ROXICODONE) 30 MG immediate release tablet Take 1 tablet (30 mg total) by mouth every 4 (four) hours as needed for  severe pain. 03/12/16   Heath Lark, MD  Polyvinyl Alcohol-Povidone (REFRESH OP) Place 2 drops into both eyes 2 (two) times daily as needed (dry eyes).    Historical Provider, MD  pomalidomide (POMALYST) 2 MG capsule Take 1 capsule (2 mg total) by mouth daily. Take with water on days 1-21. Repeat every 28 days. 03/12/16   Heath Lark, MD  traMADol (ULTRAM) 50 MG tablet Take 100 mg by mouth every 6 (six) hours as needed. for pain 01/27/16   Historical Provider, MD  valsartan (DIOVAN) 160 MG tablet Take 1 tablet (160 mg total) by mouth daily. 10/23/15   Eileen Stanford, PA-C    Family History Family History  Problem Relation Age of Onset  . Stroke Father   . Heart failure Mother     Social History Social History  Substance Use Topics  . Smoking status: Never Smoker  . Smokeless tobacco: Never Used  . Alcohol use No     Allergies   No known allergies   Review of Systems Review of  Systems ROS reviewed and all are negative for acute change except as noted in the HPI.  Physical Exam Updated Vital Signs BP 112/66 (BP Location: Right Arm)   Pulse 72   Temp 98.7 F (37.1 C) (Oral)   Resp 16   SpO2 100%   Physical Exam  Constitutional: She is oriented to person, place, and time. Vital signs are normal. She appears well-developed and well-nourished.  HENT:  Head: Normocephalic and atraumatic.  Right Ear: Hearing normal.  Left Ear: Hearing normal.  Eyes: Conjunctivae and EOM are normal. Pupils are equal, round, and reactive to light.  Neck: Normal range of motion. Neck supple.  Cardiovascular: Normal rate, regular rhythm, normal heart sounds and intact distal pulses.   Pulmonary/Chest: Effort normal and breath sounds normal.  Placed on 2L [place] O2  Abdominal: Soft.  Neurological: She is alert and oriented to person, place, and time.  Skin: Skin is warm and dry.  Psychiatric: She has a normal mood and affect. Her speech is normal and behavior is normal. Thought content normal.    Nursing note and vitals reviewed.  ED Treatments / Results  Labs (all labs ordered are listed, but only abnormal results are displayed) Labs Reviewed  CBC - Abnormal; Notable for the following:       Result Value   WBC 2.5 (*)    RBC 2.52 (*)    Hemoglobin 9.0 (*)    HCT 26.6 (*)    MCV 105.6 (*)    MCH 35.7 (*)    RDW 16.5 (*)    All other components within normal limits  BASIC METABOLIC PANEL - Abnormal; Notable for the following:    Sodium 131 (*)    Chloride 95 (*)    Glucose, Bld 117 (*)    BUN 27 (*)    Calcium 8.5 (*)    All other components within normal limits  APTT  PROTIME-INR  BRAIN NATRIURETIC PEPTIDE  HEPARIN LEVEL (UNFRACTIONATED)  CBC  I-STAT TROPOININ, ED  I-STAT TROPOININ, ED   EKG  EKG Interpretation  Date/Time:  Tuesday March 24 2016 20:29:43 EDT Ventricular Rate:  73 PR Interval:    QRS Duration: 136 QT Interval:  439 QTC Calculation: 484 R Axis:   -69 Text Interpretation:  Sinus rhythm Probable left atrial enlargement Left bundle branch block No significant change since last tracing Confirmed by KNOTT MD, DANIEL [phone]) on 03/24/2016 9:17:18 PM      Radiology Dg Chest 2 View  Result Date: 03/24/2016 CLINICAL DATA:  Shortness of breath and back pain since this morning. Previous hospitalization 1 week ago with a cardiac event. EXAM: CHEST  2 VIEW COMPARISON:  03/16/2016 FINDINGS: Cardiac enlargement. Pulmonary vascularity is decreasing since previous study. No focal consolidation or airspace disease. Slight blunting of costophrenic angles may indicate small pleural effusions. Fibrosis in the lung apices. Apical pleural thickening bilaterally. Tortuous common dilated, and calcified aorta. Power port type central venous catheter with tip over right atrium. Multiple thoracic vertebral compression deformities post kyphoplasty. IMPRESSION: Cardiac enlargement. Small bilateral pleural effusions. No significant vascular congestion or edema.  Electronically Signed   By: Lucienne Capers M.D.   On: 03/24/2016 21:15   Ct Angio Chest Pe W And/or Wo Contrast  Result Date: 03/24/2016 CLINICAL DATA:  Acute onset of shortness of breath. Recent back surgery. Initial encounter. EXAM: CT ANGIOGRAPHY CHEST WITH CONTRAST TECHNIQUE: Multidetector CT imaging of the chest was performed using the standard protocol during bolus administration of intravenous contrast. Multiplanar CT  image reconstructions and MIPs were obtained to evaluate the vascular anatomy. CONTRAST:  71.4 mL of Isovue 370 IV contrast COMPARISON:  Chest radiograph performed earlier today at 8:57 p.m., and CTA of the chest performed 08/03/2014 FINDINGS: Cardiovascular: Pulmonary embolus is noted within segmental branches to the right lower lobe, and within subsegmental branches to the left upper lobe. The RV/LV ratio is borderline normal, though this is difficult to assess given the patient's pectus excavatum. Diffuse coronary artery calcifications are seen. Mild biatrial enlargement is suggested. Mild calcification is noted along the aortic arch and descending thoracic aorta. The great vessels are grossly unremarkable in appearance. Mediastinum/Nodes: The patient's right-sided chest port is noted ending about the right atrium. No mediastinal lymphadenopathy is seen. No significant pericardial effusion is identified. The thyroid gland is diminutive, with a prominent right-sided calcification. No axillary lymphadenopathy is seen. Lungs/Pleura: Small right and trace left pleural effusions are noted, with bibasilar atelectasis or scarring. Underlying interstitial prominence raises concern for mild interstitial edema. No pneumothorax is seen. No masses are identified. Scarring is noted at the lung apices, with associated calcification. Upper Abdomen: The visualized portions of the liver and spleen are grossly unremarkable. Reflux of contrast into the IVC is incidentally seen. Musculoskeletal: No acute  osseous abnormalities are identified. There is mild chronic compression deformity of vertebral body T6, and changes of vertebroplasty are noted at T9 through T12. The visualized musculature is unremarkable in appearance. Review of the MIP images confirms the above findings. IMPRESSION: 1. Pulmonary embolus within segmental branches to the right lower lung lobe, and within subsegmental branches to the left upper lobe. 2. Small right and trace left pleural effusions, with underlying interstitial prominence, concerning for mild interstitial edema. 3. Diffuse coronary artery calcifications seen. Mild biatrial enlargement noted. 4. Scarring at the lung apices, with associated calcification. 5. Mild chronic compression deformity of vertebral body T6, and changes of vertebroplasty at T9 through T12. Critical Value/emergent results were called by telephone at the time of interpretation on 03/24/2016 at 10:59 pm to Dr. Shary Decamp, who verbally acknowledged these results. Electronically Signed   By: Garald Balding M.D.   On: 03/24/2016 23:00    Procedures Procedures (including critical care time)  Medications Ordered in ED Medications - No data to display   Initial Impression / Assessment and Plan / ED Course  I have reviewed the triage vital signs and the nursing notes.  Pertinent labs & imaging results that were available during my care of the patient were reviewed by me and considered in my medical decision making (see chart for details).  Clinical Course    Final Clinical Impressions(s) / ED Diagnoses  I have reviewed and evaluated the relevant laboratory values I have reviewed and evaluated the relevant imaging studies. I have interpreted the relevant EKG. I have reviewed the relevant previous healthcare records. I have reviewed EMS Documentation. I obtained HPI from historian. Patient discussed with supervising physician  ED Course:  Assessment: Pt is a 89yF with hx history of aortic  regurgitation, coronary artery disease, hypertension and multiple myeloma with current relapse, hx of breast cancer s/p lumpectomy/chemo/XRT, mild LV dysfunction w/ no previous clinical CHF (45-50%), modAR/mildMR, hypothyroidism, bradycardia and LBBB who presents with shortness of breath x 2 hours ago with associated chest pain. Similar in past with recent admission on 03/16/16 for Obs.. On exam, pt in NAD. Nontoxic/nonseptic appearing. VS without tachycardia.[name] 86% on RA. Afebrile. Lungs CTA. Heart RRR. Abdomen nontender soft. iStat Trop neg. EKG similar to  previous. Concern for PE. CT Angio ordered, which showed segmental pulmonary emboli. No right heart strain. Unlikely ACS due to previous admission for same as well as recent follow up with Cardiology today. Seen by supervising physician. Plan is to Admit to Medicine.   Disposition/Plan:  Admit Pt acknowledges and agrees with plan  Supervising Physician Nat Christen, MD   Final diagnoses:  Other acute pulmonary embolism without acute cor pulmonale [facility])    New Prescriptions New Prescriptions   No medications on file     Shary Decamp, PA-C 03/25/16 0004    Nat Christen, MD 03/25/16 [phone]

## 2016-03-24 NOTE — ED Notes (Signed)
Bed: WA08 Expected date:  Expected time:  Means of arrival:  Comments: EMS 82 yo female SOB x 2 hours-recent back surgery

## 2016-03-24 NOTE — ED Notes (Signed)
Patient transported to X-ray 

## 2016-03-24 NOTE — Patient Instructions (Addendum)
Medication Instructions:  Your physician has recommended you make the following change in your medication:  1.  START Lasix 20 mg taking AS NEEDED for swelling / edema   Labwork: None ordered  Testing/Procedures: None ordered  Follow-Up: Your physician recommends that you schedule a follow-up appointment in: 3 MONTHS WITH DR. Johnsie Cancel    Any Other Special Instructions Will Be Listed Below (If Applicable).    If you need a refill on your cardiac medications before your next appointment, please call your pharmacy.

## 2016-03-24 NOTE — ED Notes (Signed)
Rn will access port to get labs

## 2016-03-24 NOTE — ED Triage Notes (Signed)
Per EMS, pt from home c/o SOB x1 day. Pt reports back surgery x1 week ago. Hx bone cancer. Denies chest pain.

## 2016-03-24 NOTE — Progress Notes (Signed)
ANTICOAGULATION CONSULT NOTE - Initial Consult  Pharmacy Consult for Heparin Indication: pulmonary embolus  Allergies  Allergen Reactions  . No Known Allergies     Patient Measurements:   Heparin Dosing Weight:   Vital Signs: Temp: 98.7 F (37.1 C) (09/19 2030) Temp Source: Oral (09/19 2030) BP: 139/65 (09/19 2300) Pulse Rate: 66 (09/19 2300)  Labs:  Recent Labs  03/24/16 2110  HGB 9.0*  HCT 26.6*  PLT 215  CREATININE 0.76    Estimated Creatinine Clearance: 39.8 mL/min (by C-G formula based on SCr of 0.76 mg/dL).   Medical History: Past Medical History:  Diagnosis Date  . Aortic regurgitation   . Breast cancer ([place])   . Coronary artery disease   . DCIS (ductal carcinoma in situ) 08/04/2012  . Depression 09/05/2014  . Dysphagia 01/23/2016  . Hot flash not due to menopause 12/23/2011  . HTN (hypertension)   . Hypothyroidism 10/18/2014  . Multiple myeloma   . Multiple myeloma ([place]) 10/11/2015  . Neuropathy ([place]) 12/23/2011  . Spinal fracture of T12 vertebra ([facility])    vertebral fx    Medications:  Infusions:  . heparin      Assessment: Patient with multiple myeloma, hx of breast cancer in ED from home with SOB x1 day, reports back surgery x1 week ago.  Goal of Therapy:  Heparin level 0.3-0.7 units/ml Monitor platelets by anticoagulation protocol: Yes   Plan:  Heparin bolus 1500  units iv x1 Heparin drip at 800  units/hr Daily CBC Next heparin level at 0900    Nani Skillern Crowford 03/24/2016,11:47 PM

## 2016-03-24 NOTE — Telephone Encounter (Signed)
Left message for pt's daughter to return call to schedule f/u. AW

## 2016-03-25 ENCOUNTER — Telehealth: Payer: Self-pay | Admitting: *Deleted

## 2016-03-25 ENCOUNTER — Inpatient Hospital Stay (HOSPITAL_COMMUNITY): Payer: Medicare Other

## 2016-03-25 DIAGNOSIS — R0602 Shortness of breath: Secondary | ICD-10-CM

## 2016-03-25 DIAGNOSIS — R609 Edema, unspecified: Secondary | ICD-10-CM

## 2016-03-25 DIAGNOSIS — I2699 Other pulmonary embolism without acute cor pulmonale: Secondary | ICD-10-CM

## 2016-03-25 DIAGNOSIS — J9601 Acute respiratory failure with hypoxia: Secondary | ICD-10-CM

## 2016-03-25 LAB — COMPREHENSIVE METABOLIC PANEL
ALK PHOS: 69 U/L (ref 38–126)
ALT: 19 U/L (ref 14–54)
AST: 21 U/L (ref 15–41)
Albumin: 2.8 g/dL — ABNORMAL LOW (ref 3.5–5.0)
Anion gap: 8 (ref 5–15)
BUN: 23 mg/dL — ABNORMAL HIGH (ref 6–20)
CALCIUM: 8.1 mg/dL — AB (ref 8.9–10.3)
CO2: 28 mmol/L (ref 22–32)
CREATININE: 0.55 mg/dL (ref 0.44–1.00)
Chloride: 95 mmol/L — ABNORMAL LOW (ref 101–111)
Glucose, Bld: 93 mg/dL (ref 65–99)
Potassium: 4.1 mmol/L (ref 3.5–5.1)
SODIUM: 131 mmol/L — AB (ref 135–145)
Total Bilirubin: 0.5 mg/dL (ref 0.3–1.2)
Total Protein: 5.7 g/dL — ABNORMAL LOW (ref 6.5–8.1)

## 2016-03-25 LAB — CBC WITH DIFFERENTIAL/PLATELET
BASOS PCT: 1 %
Basophils Absolute: 0 10*3/uL (ref 0.0–0.1)
EOS ABS: 0.2 10*3/uL (ref 0.0–0.7)
EOS PCT: 6 %
HCT: 24.2 % — ABNORMAL LOW (ref 36.0–46.0)
HEMOGLOBIN: 8.2 g/dL — AB (ref 12.0–15.0)
Lymphocytes Relative: 27 %
Lymphs Abs: 0.7 10*3/uL (ref 0.7–4.0)
MCH: 34.7 pg — ABNORMAL HIGH (ref 26.0–34.0)
MCHC: 33.9 g/dL (ref 30.0–36.0)
MCV: 102.5 fL — ABNORMAL HIGH (ref 78.0–100.0)
MONO ABS: 0.7 10*3/uL (ref 0.1–1.0)
MONOS PCT: 27 %
NEUTROS PCT: 39 %
Neutro Abs: 1 10*3/uL — ABNORMAL LOW (ref 1.7–7.7)
PLATELETS: 187 10*3/uL (ref 150–400)
RBC: 2.36 MIL/uL — ABNORMAL LOW (ref 3.87–5.11)
RDW: 16.4 % — AB (ref 11.5–15.5)
WBC: 2.5 10*3/uL — ABNORMAL LOW (ref 4.0–10.5)

## 2016-03-25 LAB — HEPARIN LEVEL (UNFRACTIONATED)
HEPARIN UNFRACTIONATED: 0.44 [IU]/mL (ref 0.30–0.70)
HEPARIN UNFRACTIONATED: 0.58 [IU]/mL (ref 0.30–0.70)

## 2016-03-25 LAB — TROPONIN I

## 2016-03-25 LAB — ECHOCARDIOGRAM COMPLETE
Height: 64 in
Weight: 1746.04 oz

## 2016-03-25 LAB — BRAIN NATRIURETIC PEPTIDE: B NATRIURETIC PEPTIDE 5: 196.8 pg/mL — AB (ref 0.0–100.0)

## 2016-03-25 MED ORDER — ARTIFICIAL TEARS OP OINT
1.0000 "application " | TOPICAL_OINTMENT | OPHTHALMIC | Status: DC | PRN
  Filled 2016-03-25: qty 3.5

## 2016-03-25 MED ORDER — ADULT MULTIVITAMIN W/MINERALS CH
1.0000 | ORAL_TABLET | Freq: Every day | ORAL | Status: DC
  Administered 2016-03-25 – 2016-03-29 (×5): 1 via ORAL
  Filled 2016-03-25 (×5): qty 1

## 2016-03-25 MED ORDER — MIRTAZAPINE 15 MG PO TABS
7.5000 mg | ORAL_TABLET | Freq: Every day | ORAL | Status: DC
  Administered 2016-03-25 – 2016-03-28 (×5): 7.5 mg via ORAL
  Filled 2016-03-25 (×5): qty 1

## 2016-03-25 MED ORDER — OXYCODONE HCL 5 MG PO TABS
30.0000 mg | ORAL_TABLET | ORAL | Status: DC | PRN
  Administered 2016-03-25 – 2016-03-29 (×13): 30 mg via ORAL
  Filled 2016-03-25 (×13): qty 6

## 2016-03-25 MED ORDER — IRBESARTAN 150 MG PO TABS
150.0000 mg | ORAL_TABLET | Freq: Every day | ORAL | Status: DC
  Administered 2016-03-25 – 2016-03-29 (×5): 150 mg via ORAL
  Filled 2016-03-25 (×5): qty 1

## 2016-03-25 MED ORDER — ASPIRIN EC 81 MG PO TBEC
81.0000 mg | DELAYED_RELEASE_TABLET | Freq: Every day | ORAL | Status: DC
  Administered 2016-03-25 – 2016-03-29 (×5): 81 mg via ORAL
  Filled 2016-03-25 (×5): qty 1

## 2016-03-25 MED ORDER — POLYVINYL ALCOHOL 1.4 % OP SOLN
2.0000 [drp] | Freq: Two times a day (BID) | OPHTHALMIC | Status: DC
Start: 2016-03-25 — End: 2016-03-29
  Administered 2016-03-25 – 2016-03-28 (×8): 2 [drp] via OPHTHALMIC
  Filled 2016-03-25: qty 15

## 2016-03-25 MED ORDER — ALPRAZOLAM 0.25 MG PO TABS
0.2500 mg | ORAL_TABLET | Freq: Once | ORAL | Status: AC
Start: 2016-03-25 — End: 2016-03-25
  Administered 2016-03-25: 0.25 mg via ORAL
  Filled 2016-03-25: qty 1

## 2016-03-25 MED ORDER — ACETAMINOPHEN 325 MG PO TABS: 650.0000 mg | ORAL_TABLET | Freq: Four times a day (QID) | ORAL | Status: DC | PRN

## 2016-03-25 MED ORDER — ACETAMINOPHEN 650 MG RE SUPP: 650.0000 mg | Freq: Four times a day (QID) | RECTAL | Status: DC | PRN

## 2016-03-25 MED ORDER — DEXAMETHASONE 4 MG PO TABS
4.0000 mg | ORAL_TABLET | Freq: Every day | ORAL | Status: DC
  Administered 2016-03-25 – 2016-03-29 (×5): 4 mg via ORAL
  Filled 2016-03-25 (×5): qty 1

## 2016-03-25 MED ORDER — MORPHINE SULFATE ER 30 MG PO TBCR
60.0000 mg | EXTENDED_RELEASE_TABLET | Freq: Two times a day (BID) | ORAL | Status: DC
  Administered 2016-03-25 – 2016-03-29 (×10): 60 mg via ORAL
  Filled 2016-03-25 (×10): qty 2

## 2016-03-25 MED ORDER — SODIUM CHLORIDE 0.9% FLUSH
10.0000 mL | INTRAVENOUS | Status: DC | PRN
  Administered 2016-03-28 – 2016-03-29 (×3): 10 mL
  Filled 2016-03-25 (×3): qty 40

## 2016-03-25 MED ORDER — LEVOTHYROXINE SODIUM 50 MCG PO TABS
50.0000 ug | ORAL_TABLET | Freq: Every day | ORAL | Status: DC
  Administered 2016-03-25 – 2016-03-29 (×5): 50 ug via ORAL
  Filled 2016-03-25 (×5): qty 1

## 2016-03-25 MED ORDER — SODIUM CHLORIDE 0.9% FLUSH
3.0000 mL | Freq: Two times a day (BID) | INTRAVENOUS | Status: DC
  Administered 2016-03-25: 3 mL via INTRAVENOUS

## 2016-03-25 MED ORDER — VITAMIN D3 25 MCG (1000 UNIT) PO TABS
1000.0000 [IU] | ORAL_TABLET | Freq: Every day | ORAL | Status: DC
  Administered 2016-03-25 – 2016-03-29 (×5): 1000 [IU] via ORAL
  Filled 2016-03-25 (×5): qty 1

## 2016-03-25 MED ORDER — ONDANSETRON HCL 4 MG/2ML IJ SOLN: 4.0000 mg | Freq: Four times a day (QID) | INTRAMUSCULAR | Status: DC | PRN

## 2016-03-25 MED ORDER — ALPRAZOLAM 0.25 MG PO TABS
0.2500 mg | ORAL_TABLET | Freq: Three times a day (TID) | ORAL | Status: DC | PRN
  Administered 2016-03-25 – 2016-03-29 (×5): 0.25 mg via ORAL
  Filled 2016-03-25 (×5): qty 1

## 2016-03-25 MED ORDER — ONDANSETRON HCL 4 MG PO TABS: 4.0000 mg | ORAL_TABLET | Freq: Four times a day (QID) | ORAL | Status: DC | PRN

## 2016-03-25 MED ORDER — GABAPENTIN 300 MG PO CAPS
300.0000 mg | ORAL_CAPSULE | Freq: Three times a day (TID) | ORAL | Status: DC
  Administered 2016-03-25 – 2016-03-29 (×13): 300 mg via ORAL
  Filled 2016-03-25 (×14): qty 1

## 2016-03-25 NOTE — Progress Notes (Signed)
VASCULAR LAB PRELIMINARY  PRELIMINARY  PRELIMINARY  PRELIMINARY  .Bilateral lower extremity venous duplex completed.    Preliminary report:  Bilateral:  No evidence of DVT, superficial thrombosis, or Baker's Cyst.   Kamylah Manzo, RVS 03/25/2016, 11:10 AM

## 2016-03-25 NOTE — Progress Notes (Signed)
  Echocardiogram 2D Echocardiogram has been performed.  [name] 03/25/2016, 3:54 PM

## 2016-03-25 NOTE — Progress Notes (Addendum)
PHARMACIST - PHYSICIAN COMMUNICATION CONCERNING:  IV Heparin  89 yoF on IV heparin for PE.  Please see note written earlier today by Lindell Spar, PharmD for more details.    Heparin level tonight = 0.58, therapeutic (goal 0.3-0.7) at rate of 800 units/hr.  No issues with infusion or bleeding per RN.    RECOMMENDATION: Continue heparin at current rate.  Start daily heparin levels.   Ralene Bathe, PharmD, BCPS 03/25/2016, 7:17 PM  Pager: [phone]

## 2016-03-25 NOTE — ED Notes (Signed)
MD at bedside. 

## 2016-03-25 NOTE — Telephone Encounter (Signed)
Daughter called to let Dr. Alvy Bimler know pt is admitted to [facility] for a blood clot in her lung.  She says pt is supposed to be on day 2 of this cycle of Pomalyst but has not been able to take it due to this hospitalization.   Informed Dr. Alvy Bimler of above.  She instructs to stop the Pomalyst.  She will see pt tomorrow in hospital. Notified daughter of instructions to hold pomalyst.  Informed her of higher risk of blood clots on this medication and pt may not be able to continue on it.  Dr. Alvy Bimler will discuss that with pt later, but for now let pt know to hold it.  Daughter verbalized understanding.

## 2016-03-25 NOTE — Progress Notes (Signed)
Patient ID: Natasha Jackson, female   DOB: 1926-05-03, 82 y.o.   MRN: [id number]    PROGRESS NOTE    MAZAL EBEY  TOI:[id number] DOB: 02-06-1926 DOA: 03/24/2016  PCP: Thressa Sheller, MD   Brief Narrative:  Pt admitted after midnight, please see earlier note by Dr. Eulas Post. Pt admitted for evaluation of dyspnea, noted to have pulmonary embolus, started on heparin drip.   Assessment & Plan:   Active Problems:   Acute hypoxic respiratory failure with hypoxia - secondary to PE - still unable to taper off oxygen  - keep on [place] with low threshold to place on NRB if unable to keep O2 sat > 90% - keep on Heparin drip     Multiple myeloma in relapse ([place]) - will notify Dr. Alvy Bimler of pt's admission     Leukopenia, anemia of chronic disease  - mild, from underlying malignancy  - no sings of bleeding  - monitor with CBC   DVT prophylaxis: On Heparin drip  Code Status: Full  Family Communication: Patient and daughter at bedside, rounding team including myself, RN, CM, pharmacist Disposition Plan: Home in 1-2 days   Consultants:   None  Procedures:   None   Antimicrobials:   None   Subjective: Still dyspnea this AM.   Objective: Vitals:   03/25/16 0623 03/25/16 0845 03/25/16 0900 03/25/16 1332  BP: (!) 112/49   (!) 101/35  Pulse: 64   75  Resp: 20   (!) 22  Temp: 98.2 F (36.8 C)   98.6 F (37 C)  TempSrc: Oral   Oral  SpO2: 96% (!) 84% 92%   Weight:      Height:        Intake/Output Summary (Last 24 hours) at 03/25/16 1825 Last data filed at 03/25/16 1300  Gross per 24 hour  Intake              512 ml  Output             1000 ml  Net             -488 ml   Filed Weights   03/25/16 0123  Weight: 49.5 kg (109 lb 2 oz)    Examination:  General exam: Appears to be more anxious this AM  Respiratory system: Respiratory effort normal. Diminished breath sounds at bases  Cardiovascular system: S1 & S2 heard, RRR. No JVD, murmurs, rubs, gallops or clicks. No  pedal edema. Gastrointestinal system: Abdomen is nondistended, soft and nontender. No organomegaly or masses felt.   Data Reviewed: I have personally reviewed following labs and imaging studies  CBC:  Recent Labs Lab 03/24/16 2110 03/25/16 0530  WBC 2.5* 2.5*  NEUTROABS  --  1.0*  HGB 9.0* 8.2*  HCT 26.6* 24.2*  MCV 105.6* 102.5*  PLT 215 833   Basic Metabolic Panel:  Recent Labs Lab 03/24/16 2110 03/25/16 0530  NA 131* 131*  K 4.4 4.1  CL 95* 95*  CO2 28 28  GLUCOSE 117* 93  BUN 27* 23*  CREATININE 0.76 0.55  CALCIUM 8.5* 8.1*   Liver Function Tests:  Recent Labs Lab 03/25/16 0530  AST 21  ALT 19  ALKPHOS 69  BILITOT 0.5  PROT 5.7*  ALBUMIN 2.8*   Coagulation Profile:  Recent Labs Lab 03/24/16 2110  INR 1.06   Cardiac Enzymes:  Recent Labs Lab 03/25/16 0530  TROPONINI <0.03   Urine analysis:    Component Value Date/Time   COLORURINE YELLOW 08/01/2014  [place] 08/01/2014 1820   LABSPEC 1.009 08/01/2014 1820   PHURINE 6.5 08/01/2014 1820   GLUCOSEU NEGATIVE 08/01/2014 1820   HGBUR SMALL (A) 08/01/2014 1820   BILIRUBINUR NEGATIVE 08/01/2014 1820   KETONESUR 40 (A) 08/01/2014 1820   PROTEINUR NEGATIVE 08/01/2014 1820   UROBILINOGEN 0.2 08/01/2014 1820   NITRITE NEGATIVE 08/01/2014 1820   LEUKOCYTESUR NEGATIVE 08/01/2014 1820   Radiology Studies: Dg Chest 2 View  Result Date: 03/24/2016 CLINICAL DATA:  Shortness of breath and back pain since this morning. Previous hospitalization 1 week ago with a cardiac event. EXAM: CHEST  2 VIEW COMPARISON:  03/16/2016 FINDINGS: Cardiac enlargement. Pulmonary vascularity is decreasing since previous study. No focal consolidation or airspace disease. Slight blunting of costophrenic angles may indicate small pleural effusions. Fibrosis in the lung apices. Apical pleural thickening bilaterally. Tortuous common dilated, and calcified aorta. Power port type central venous catheter with tip over  right atrium. Multiple thoracic vertebral compression deformities post kyphoplasty. IMPRESSION: Cardiac enlargement. Small bilateral pleural effusions. No significant vascular congestion or edema. Electronically Signed   By: Lucienne Capers M.D.   On: 03/24/2016 21:15   Ct Angio Chest Pe W And/or Wo Contrast  Result Date: 03/24/2016 CLINICAL DATA:  Acute onset of shortness of breath. Recent back surgery. Initial encounter. EXAM: CT ANGIOGRAPHY CHEST WITH CONTRAST TECHNIQUE: Multidetector CT imaging of the chest was performed using the standard protocol during bolus administration of intravenous contrast. Multiplanar CT image reconstructions and MIPs were obtained to evaluate the vascular anatomy. CONTRAST:  71.4 mL of Isovue 370 IV contrast COMPARISON:  Chest radiograph performed earlier today at 8:57 p.m., and CTA of the chest performed 08/03/2014 FINDINGS: Cardiovascular: Pulmonary embolus is noted within segmental branches to the right lower lobe, and within subsegmental branches to the left upper lobe. The RV/LV ratio is borderline normal, though this is difficult to assess given the patient's pectus excavatum. Diffuse coronary artery calcifications are seen. Mild biatrial enlargement is suggested. Mild calcification is noted along the aortic arch and descending thoracic aorta. The great vessels are grossly unremarkable in appearance. Mediastinum/Nodes: The patient's right-sided chest port is noted ending about the right atrium. No mediastinal lymphadenopathy is seen. No significant pericardial effusion is identified. The thyroid gland is diminutive, with a prominent right-sided calcification. No axillary lymphadenopathy is seen. Lungs/Pleura: Small right and trace left pleural effusions are noted, with bibasilar atelectasis or scarring. Underlying interstitial prominence raises concern for mild interstitial edema. No pneumothorax is seen. No masses are identified. Scarring is noted at the lung apices, with  associated calcification. Upper Abdomen: The visualized portions of the liver and spleen are grossly unremarkable. Reflux of contrast into the IVC is incidentally seen. Musculoskeletal: No acute osseous abnormalities are identified. There is mild chronic compression deformity of vertebral body T6, and changes of vertebroplasty are noted at T9 through T12. The visualized musculature is unremarkable in appearance. Review of the MIP images confirms the above findings. IMPRESSION: 1. Pulmonary embolus within segmental branches to the right lower lung lobe, and within subsegmental branches to the left upper lobe. 2. Small right and trace left pleural effusions, with underlying interstitial prominence, concerning for mild interstitial edema. 3. Diffuse coronary artery calcifications seen. Mild biatrial enlargement noted. 4. Scarring at the lung apices, with associated calcification. 5. Mild chronic compression deformity of vertebral body T6, and changes of vertebroplasty at T9 through T12. Critical Value/emergent results were called by telephone at the time of interpretation on 03/24/2016 at 10:59 pm to  Dr. Shary Decamp, who verbally acknowledged these results. Electronically Signed   By: Garald Balding M.D.   On: 03/24/2016 23:00      Scheduled Meds: . aspirin EC  81 mg Oral Daily  . cholecalciferol  1,000 Units Oral Daily  . dexamethasone  4 mg Oral Daily  . gabapentin  300 mg Oral TID  . irbesartan  150 mg Oral Daily  . levothyroxine  50 mcg Oral QAC breakfast  . mirtazapine  7.5 mg Oral QHS  . morphine  60 mg Oral Q12H  . multivitamin with minerals  1 tablet Oral Daily  . polyvinyl alcohol  2 drop Both Eyes BID  . sodium chloride flush  3 mL Intravenous Q12H   Continuous Infusions: . heparin 800 Units/hr (03/25/16 0000)     LOS: 1 day   Time spent: 20 minutes   Faye Ramsay, MD [facility] Pager [phone]  If 7PM-7AM, please contact night-coverage [email or web address] Password  [facility] 03/25/2016, 6:25 PM

## 2016-03-25 NOTE — Care Management Note (Signed)
Case Management Note  Patient Details  Name: Natasha Jackson MRN: [id number] Date of Birth: [birth year]  Subjective/Objective: 82 y/o f admitted w/PE. From home.                   Action/Plan:d/c plan home.   Expected Discharge Date:                  Expected Discharge Plan:  Home/Self Care  In-House Referral:     Discharge planning Services  CM Consult  Post Acute Care Choice:    Choice offered to:     DME Arranged:    DME Agency:     HH Arranged:    HH Agency:     Status of Service:  In process, will continue to follow  If discussed at Long Length of Stay Meetings, dates discussed:    Additional Comments:  Dessa Phi, RN 03/25/2016, 11:18 AM

## 2016-03-25 NOTE — Progress Notes (Signed)
Patient's daughter called RN to see patient because she was having difficulty breathing.  When RN assessed patient, she was sitting up in bed, crying, short of breath, and very anxious.  Her oxygen saturation was ranging from low to mid 80s. Oxygen was increased to 6 L/M [place].  Patient has history of anxiety and daughter stated that after she had some visitors in the room she started to have a panic attack after talking about everything that is currently going on with the patient.  After coaching patient on her breathing and increasing her oxygen, patient started to relax.  Will continue to monitor.

## 2016-03-25 NOTE — Progress Notes (Signed)
ANTICOAGULATION CONSULT NOTE - [facility] for Heparin Indication: pulmonary embolus  Allergies  Allergen Reactions  . No Known Allergies     Patient Measurements: Height: 5' 4"  (162.6 cm) Weight: 109 lb 2 oz (49.5 kg) IBW/kg (Calculated) : 54.7 Heparin Dosing Weight: actual body weight  Vital Signs: Temp: 98.2 F (36.8 C) (09/20 0623) Temp Source: Oral (09/20 0623) BP: 112/49 (09/20 0623) Pulse Rate: 64 (09/20 0623)  Labs:  Recent Labs  03/24/16 2110 03/25/16 0530 03/25/16 0935  HGB 9.0* 8.2*  --   HCT 26.6* 24.2*  --   PLT 215 187  --   APTT 34  --   --   LABPROT 13.8  --   --   INR 1.06  --   --   HEPARINUNFRC  --   --  0.44  CREATININE 0.76 0.55  --   TROPONINI  --  <0.03  --     Estimated Creatinine Clearance: 38 mL/min (by C-G formula based on SCr of 0.55 mg/dL).   Medical History: Past Medical History:  Diagnosis Date  . Aortic regurgitation   . Breast cancer ([place])   . Coronary artery disease   . DCIS (ductal carcinoma in situ) 08/04/2012  . Depression 09/05/2014  . Dysphagia 01/23/2016  . Hot flash not due to menopause 12/23/2011  . HTN (hypertension)   . Hypothyroidism 10/18/2014  . Multiple myeloma   . Multiple myeloma ([place]) 10/11/2015  . Neuropathy ([place]) 12/23/2011  . Spinal fracture of T12 vertebra ([facility])    vertebral fx    Assessment: 43 y/oF with PMH of MM, remote hx of breast cancer, chronic pancytopenia, HTN, CAD, recent kyphoplasty who [facility] ED on 03/24/16 with chest pain and SOB. CTa of chest positive for bilateral PE. Pomalidomide stopped, as can cause VTE. Pharmacy consulted to dose heparin infusion. Baseline coags WNL.  Today, 03/25/16:  0935 HL: 0.44 units/mL, therapeutic on heparin infusion at 800 units/hr  CBC: Hgb low, but relatively stable. Pltc WNL.  No bleeding or line issues reported per nursing  Bilateral lower extremity venous duplex (-) for DVT  Goal of Therapy:  Heparin level 0.3-0.7  units/ml Monitor platelets by anticoagulation protocol: Yes   Plan:  Continue heparin infusion at 800 units/hr. Check heparin level at 1800. Daily CBC, heparin level while on heparin infusion. Monitor for s/sx of bleeding.  F/u long-term anticoagulation recs from Oncology.   Lindell Spar, PharmD, BCPS Pager: [phone] 03/25/2016 11:23 AM

## 2016-03-25 NOTE — H&P (Signed)
History and Physical    Natasha Jackson EBR:[id number] DOB: 15-Sep-1925 DOA: 03/24/2016  PCP: Thressa Sheller, MD  HEME/ONC: Dr. Alvy Bimler  Patient coming from: Home  Chief Complaint: Chest pain and shortness of breath  HPI: Natasha Jackson is a 82 y.o. woman with a history of MM (active treatment), chronic pancytopenia, HTN, CAD, systolic heart failure, neuropathy, thyroid disease, and remote breast cancer who was in her baseline state of health until almost two weeks ago.  She developed intermittent episodes of chest pain and shortness of breath.  She presented to [facility] on 9/11 and was admitted for an observation stay.  She was thought to have a mild CHF exacerbation, and she was admitted for gentle diuresis.  She was discharged in stable condition and was cleared to have her kyphoplasty.  The patient tolerated this procedure on Friday.  Symptoms returned by Saturday, and have been escalating in intensity and frequency, which prompted presentation to the ED today (even though she was seen in cardiology clinic today as well).  She has had mild dizziness but no syncope or loss of consciousness.  No calf pain.  No leg swelling.  Chronic bilateral ankle edema.  She is not sedentary at baseline  ED Course: CTA of the chest shows bilateral PE, segmental involvement to RLL and subsegmental involvement to LUL.  She has been started on heparin infusion per protocol.  She is requiring 2L [place] which is new due to O2 sat 84% on RA upon presentation.  O2 sat 96% with 2L [place].  She is anxious and rates her pain 7-8 out of 10 currently.  Hospitalist asked to admit.  Review of Systems: As per HPI otherwise 10 point review of systems negative.    Past Medical History:  Diagnosis Date  . Aortic regurgitation   . Breast cancer ([place])   . Coronary artery disease   . DCIS (ductal carcinoma in situ) 08/04/2012  . Depression 09/05/2014  . Dysphagia 01/23/2016  . Hot flash not due to menopause 12/23/2011  . HTN  (hypertension)   . Hypothyroidism 10/18/2014  . Multiple myeloma   . Multiple myeloma ([place]) 10/11/2015  . Neuropathy ([place]) 12/23/2011  . Spinal fracture of T12 vertebra ([facility])    vertebral fx    Past Surgical History:  Procedure Laterality Date  . INCISIONAL HERNIA REPAIR    . IR GENERIC HISTORICAL  03/20/2016   IR VERTEBROPLASTY EA ADDL (T&LS) BX INC UNI/BIL INC INJECT/IMAGING 03/20/2016 Luanne Bras, MD [facility]  . IR GENERIC HISTORICAL  03/20/2016   IR VERTEBROPLASTY CERV/THOR BX INC UNI/BIL INC/INJECT/IMAGING 03/20/2016 Luanne Bras, MD [facility]  . lymph node excision - left groin       reports that she has never smoked. She has never used smokeless tobacco. She reports that she does not drink alcohol or use drugs.  She is married and still lives at home with her 64yo husband.  Allergies  Allergen Reactions  . No Known Allergies     Family History  Problem Relation Age of Onset  . Stroke Father   . Heart failure Mother   No known family history of clotting disorders.   Prior to Admission medications   Medication Sig Start Date End Date Taking? Authorizing Provider  ALPRAZolam (XANAX) 0.25 MG tablet Take 1 tablet (0.25 mg total) by mouth 3 (three) times daily as needed for anxiety. 03/18/16  Yes Heath Lark, MD  Artificial Tear Ointment (ARTIFICIAL TEARS) ointment Apply 1 drop to eye as needed (  dry eyes).    Yes Historical Provider, MD  aspirin EC 81 MG EC tablet Take 1 tablet (81 mg total) by mouth daily. 03/17/16  Yes Eileen Stanford, PA-C  cholecalciferol (VITAMIN D) 1000 UNITS tablet Take 1,000 Units by mouth daily. 05/01/13  Yes Heath Lark, MD  dexamethasone (DECADRON) 4 MG tablet Take 4 mg by mouth daily.    Yes Historical Provider, MD  furosemide (LASIX) 20 MG tablet Take 1 tablet daily as needed for swelling Patient taking differently: Take 20 mg by mouth daily as needed for fluid. Take 1 tablet daily as needed for swelling 03/24/16  Yes Eileen Stanford, PA-C  gabapentin (NEURONTIN) 300 MG capsule Take 300 mg by mouth 3 (three) times daily.   Yes Historical Provider, MD  levothyroxine (SYNTHROID, LEVOTHROID) 50 MCG tablet Take 50 mcg by mouth daily before breakfast.   Yes Historical Provider, MD  mirtazapine (REMERON) 7.5 MG tablet Take 7.5 mg by mouth at bedtime.   Yes Historical Provider, MD  morphine (MS CONTIN) 60 MG 12 hr tablet Take 1 tablet (60 mg total) by mouth every 12 (twelve) hours. 03/12/16  Yes Heath Lark, MD  Multiple Vitamin (MULTIVITAMIN WITH MINERALS) TABS tablet Take 1 tablet by mouth daily.   Yes Historical Provider, MD  nitroGLYCERIN (NITROSTAT) 0.4 MG SL tablet Place 1 tablet (0.4 mg total) under the tongue every 5 (five) minutes as needed for chest pain. 03/17/16  Yes Josue Hector, MD  oxyCODONE (ROXICODONE) 30 MG immediate release tablet Take 1 tablet (30 mg total) by mouth every 4 (four) hours as needed for severe pain. 03/12/16  Yes Heath Lark, MD  pomalidomide (POMALYST) 2 MG capsule Take 1 capsule (2 mg total) by mouth daily. Take with water on days 1-21. Repeat every 28 days. 03/12/16  Yes Heath Lark, MD  valsartan (DIOVAN) 160 MG tablet Take 1 tablet (160 mg total) by mouth daily. 10/23/15  Yes Eileen Stanford, PA-C  Polyvinyl Alcohol-Povidone (REFRESH OP) Place 2 drops into both eyes 2 (two) times daily as needed (dry eyes).    Historical Provider, MD    Physical Exam: Vitals:   03/24/16 2330 03/25/16 0000 03/25/16 0030 03/25/16 0123  BP: 134/67 131/71 147/61 (!) 164/67  Pulse: 62 72 68 66  Resp:    20  Temp:    98.4 F (36.9 C)  TempSrc:    Oral  SpO2: 100% 97% 99% 100%  Weight:    49.5 kg (109 lb 2 oz)  Height:    5' 4"  (1.626 m)      Constitutional: NAD, elderly and fail appearing, she is anxious Vitals:   03/24/16 2330 03/25/16 0000 03/25/16 0030 03/25/16 0123  BP: 134/67 131/71 147/61 (!) 164/67  Pulse: 62 72 68 66  Resp:    20  Temp:    98.4 F (36.9 C)  TempSrc:    Oral  SpO2: 100%  97% 99% 100%  Weight:    49.5 kg (109 lb 2 oz)  Height:    5' 4"  (1.626 m)   Eyes: PERRL, lids and conjunctivae normal ENMT: Mucous membranes are moist. Posterior pharynx clear of any exudate or lesions. Normal dentition.  Neck: normal appearance, supple Respiratory: clear to auscultation bilaterally, no wheezing, no crackles. Normal respiratory effort. No accessory muscle use.  Cardiovascular: Normal rate, regular rhythm, no murmurs / rubs / gallops. Bilateral ankle edema.   2+ pedal pulses. GI: abdomen is soft and compressible.  No distention.  No tenderness.  Bowel sounds are present. Musculoskeletal:  No joint deformity in upper and lower extremities. Good ROM, no contractures. Normal muscle tone.  Skin: Pale, cool, dry Neurologic: No focal deficits. Psychiatric: Normal judgment and insight. Alert and oriented x 3. Normal mood.     Labs on Admission: I have personally reviewed following labs and imaging studies  CBC:  Recent Labs Lab 03/24/16 2110  WBC 2.5*  HGB 9.0*  HCT 26.6*  MCV 105.6*  PLT 856   Basic Metabolic Panel:  Recent Labs Lab 03/24/16 2110  NA 131*  K 4.4  CL 95*  CO2 28  GLUCOSE 117*  BUN 27*  CREATININE 0.76  CALCIUM 8.5*   GFR: Estimated Creatinine Clearance: 38 mL/min (by C-G formula based on SCr of 0.76 mg/dL).  Coagulation Profile:  Recent Labs Lab 03/24/16 2110  INR 1.06   Urine analysis:    Component Value Date/Time   COLORURINE YELLOW 08/01/2014 1820   APPEARANCEUR CLEAR 08/01/2014 1820   LABSPEC 1.009 08/01/2014 1820   PHURINE 6.5 08/01/2014 1820   GLUCOSEU NEGATIVE 08/01/2014 1820   HGBUR SMALL (A) 08/01/2014 1820   BILIRUBINUR NEGATIVE 08/01/2014 1820   KETONESUR 40 (A) 08/01/2014 1820   PROTEINUR NEGATIVE 08/01/2014 1820   UROBILINOGEN 0.2 08/01/2014 1820   NITRITE NEGATIVE 08/01/2014 1820   LEUKOCYTESUR NEGATIVE 08/01/2014 1820    Radiological Exams on Admission: Dg Chest 2 View  Result Date: 03/24/2016 CLINICAL  DATA:  Shortness of breath and back pain since this morning. Previous hospitalization 1 week ago with a cardiac event. EXAM: CHEST  2 VIEW COMPARISON:  03/16/2016 FINDINGS: Cardiac enlargement. Pulmonary vascularity is decreasing since previous study. No focal consolidation or airspace disease. Slight blunting of costophrenic angles may indicate small pleural effusions. Fibrosis in the lung apices. Apical pleural thickening bilaterally. Tortuous common dilated, and calcified aorta. Power port type central venous catheter with tip over right atrium. Multiple thoracic vertebral compression deformities post kyphoplasty. IMPRESSION: Cardiac enlargement. Small bilateral pleural effusions. No significant vascular congestion or edema. Electronically Signed   By: Lucienne Capers M.D.   On: 03/24/2016 21:15   Ct Angio Chest Pe W And/or Wo Contrast  Result Date: 03/24/2016 CLINICAL DATA:  Acute onset of shortness of breath. Recent back surgery. Initial encounter. EXAM: CT ANGIOGRAPHY CHEST WITH CONTRAST TECHNIQUE: Multidetector CT imaging of the chest was performed using the standard protocol during bolus administration of intravenous contrast. Multiplanar CT image reconstructions and MIPs were obtained to evaluate the vascular anatomy. CONTRAST:  71.4 mL of Isovue 370 IV contrast COMPARISON:  Chest radiograph performed earlier today at 8:57 p.m., and CTA of the chest performed 08/03/2014 FINDINGS: Cardiovascular: Pulmonary embolus is noted within segmental branches to the right lower lobe, and within subsegmental branches to the left upper lobe. The RV/LV ratio is borderline normal, though this is difficult to assess given the patient's pectus excavatum. Diffuse coronary artery calcifications are seen. Mild biatrial enlargement is suggested. Mild calcification is noted along the aortic arch and descending thoracic aorta. The great vessels are grossly unremarkable in appearance. Mediastinum/Nodes: The patient's  right-sided chest port is noted ending about the right atrium. No mediastinal lymphadenopathy is seen. No significant pericardial effusion is identified. The thyroid gland is diminutive, with a prominent right-sided calcification. No axillary lymphadenopathy is seen. Lungs/Pleura: Small right and trace left pleural effusions are noted, with bibasilar atelectasis or scarring. Underlying interstitial prominence raises concern for mild interstitial edema. No pneumothorax is seen. No masses are identified. Scarring is noted at the  lung apices, with associated calcification. Upper Abdomen: The visualized portions of the liver and spleen are grossly unremarkable. Reflux of contrast into the IVC is incidentally seen. Musculoskeletal: No acute osseous abnormalities are identified. There is mild chronic compression deformity of vertebral body T6, and changes of vertebroplasty are noted at T9 through T12. The visualized musculature is unremarkable in appearance. Review of the MIP images confirms the above findings. IMPRESSION: 1. Pulmonary embolus within segmental branches to the right lower lung lobe, and within subsegmental branches to the left upper lobe. 2. Small right and trace left pleural effusions, with underlying interstitial prominence, concerning for mild interstitial edema. 3. Diffuse coronary artery calcifications seen. Mild biatrial enlargement noted. 4. Scarring at the lung apices, with associated calcification. 5. Mild chronic compression deformity of vertebral body T6, and changes of vertebroplasty at T9 through T12. Critical Value/emergent results were called by telephone at the time of interpretation on 03/24/2016 at 10:59 pm to Dr. Shary Decamp, who verbally acknowledged these results. Electronically Signed   By: Garald Balding M.D.   On: 03/24/2016 23:00    EKG: Independently reviewed. NSR, LBBB (not new).  Appears stable compared to one week ago.  Assessment/Plan Active Problems:   Multiple myeloma  in relapse ([facility])   LBBB (left bundle branch block)   Vertebral compression fracture ([facility])   Anemia in neoplastic disease   Quality of life palliative care encounter   Pulmonary embolism ([facility])   Acute respiratory failure with hypoxia ([facility])   Pulmonary embolus ([facility])      Acute PE with acute hypoxia and new O2 requirement --Anticoagulation with unfractionated heparin IV infusion per protocol for now; will need to discuss long-term preferences with heme/onc Dr. Alvy Bimler in the AM.  She is actively undergoing chemotherapy for a history of multiple myeloma. --STOP pomalidomide NOW; it can cause VTE.  Will also need to discuss with Dr. Alvy Bimler --Limited echo in the AM to look for worsening CHF/right heart strain --Repeat troponin x 1 in the AM  History of systolic heart failure --Compensated at this point; hold diuretics for now  MM, active treatment, with chronic pancytopenia --Will need to discuss with Dr. Alvy Bimler; stopping oral agent now due to acute PE.  She is due for IV chemo next week; will need to determine if this is still appropriate --Follow CBC with systemic anticoagulation --Continue decadron  Anxiety --Xanax prn --Remeron qHS  S/P recent kyphoplasty for thoracic compression fracture; chronic pain --Continue home doses of MS contin and oxycodone for breakthrough --Continue home dose of neurotin  HTN --Continue ARB  Hypothyroidism --Continue home dose of levothyroxine         DVT prophylaxis: FULL anticoagulation with IV heparin Code Status: Limited CODE (no chest compressions but she would want intubation, shocks, cardiac medications) Family Communication: Patient's daughter who has medical POA present in the ED at time of admission.  April Moran [phone] Disposition Plan: Expect she will go home at discharge.  Will need to determine if there is a need for home O2.  Will need to discuss long-term anticoagulation with her heme/onc Dr. Alvy Bimler. Consults  called: NONE Admission status: Inpatient, telemetry   TIME SPENT: 70 minutes   Eber Jones MD [facility] Pager [phone]  If 7PM-7AM, please contact night-coverage [email or web address] Password [email or web address]  03/25/2016, 2:02 AM

## 2016-03-26 ENCOUNTER — Ambulatory Visit: Payer: Medicare Other | Admitting: Hematology and Oncology

## 2016-03-26 ENCOUNTER — Other Ambulatory Visit: Payer: Self-pay | Admitting: Hematology and Oncology

## 2016-03-26 DIAGNOSIS — E440 Moderate protein-calorie malnutrition: Secondary | ICD-10-CM

## 2016-03-26 DIAGNOSIS — C9002 Multiple myeloma in relapse: Secondary | ICD-10-CM

## 2016-03-26 DIAGNOSIS — M545 Low back pain: Secondary | ICD-10-CM

## 2016-03-26 DIAGNOSIS — D61810 Antineoplastic chemotherapy induced pancytopenia: Secondary | ICD-10-CM

## 2016-03-26 DIAGNOSIS — K5903 Drug induced constipation: Secondary | ICD-10-CM

## 2016-03-26 DIAGNOSIS — I2699 Other pulmonary embolism without acute cor pulmonale: Principal | ICD-10-CM

## 2016-03-26 DIAGNOSIS — I429 Cardiomyopathy, unspecified: Secondary | ICD-10-CM

## 2016-03-26 LAB — BASIC METABOLIC PANEL
ANION GAP: 6 (ref 5–15)
BUN: 18 mg/dL (ref 6–20)
CALCIUM: 8.2 mg/dL — AB (ref 8.9–10.3)
CO2: 29 mmol/L (ref 22–32)
CREATININE: 0.52 mg/dL (ref 0.44–1.00)
Chloride: 98 mmol/L — ABNORMAL LOW (ref 101–111)
Glucose, Bld: 85 mg/dL (ref 65–99)
Potassium: 4.5 mmol/L (ref 3.5–5.1)
Sodium: 133 mmol/L — ABNORMAL LOW (ref 135–145)

## 2016-03-26 LAB — CBC
HCT: 26.2 % — ABNORMAL LOW (ref 36.0–46.0)
Hemoglobin: 8.7 g/dL — ABNORMAL LOW (ref 12.0–15.0)
MCH: 35.4 pg — ABNORMAL HIGH (ref 26.0–34.0)
MCHC: 33.2 g/dL (ref 30.0–36.0)
MCV: 106.5 fL — ABNORMAL HIGH (ref 78.0–100.0)
PLATELETS: 217 10*3/uL (ref 150–400)
RBC: 2.46 MIL/uL — ABNORMAL LOW (ref 3.87–5.11)
RDW: 16.4 % — AB (ref 11.5–15.5)
WBC: 2.3 10*3/uL — AB (ref 4.0–10.5)

## 2016-03-26 LAB — HEPARIN LEVEL (UNFRACTIONATED): HEPARIN UNFRACTIONATED: 0.69 [IU]/mL (ref 0.30–0.70)

## 2016-03-26 MED ORDER — APIXABAN 5 MG PO TABS
10.0000 mg | ORAL_TABLET | Freq: Two times a day (BID) | ORAL | Status: DC
  Administered 2016-03-26 – 2016-03-29 (×7): 10 mg via ORAL
  Filled 2016-03-26 (×7): qty 2

## 2016-03-26 MED ORDER — VITAMINS A & D EX OINT
TOPICAL_OINTMENT | CUTANEOUS | Status: AC
  Administered 2016-03-26: 5
  Filled 2016-03-26: qty 5

## 2016-03-26 MED ORDER — APIXABAN 5 MG PO TABS: 5.0000 mg | ORAL_TABLET | Freq: Two times a day (BID) | ORAL | Status: DC

## 2016-03-26 NOTE — Progress Notes (Signed)
Natasha Jackson   DOB:[birth year]   GT#:[id number]    I was asked by the patient's daughter to see the patient today. This patient is well-known to me. Summary of oncologic history as follows: Oncology History   The     Multiple myeloma in relapse [facility])   11/12/2008 Imaging    Skeletal survey showed lytic lesion in the right femur compatible with  myeloma. There were Questionable skull lesions      11/15/2008 Bone Marrow Biopsy    BONE MARROW ASPIRATE AND BIOPSY: showed NORMOCELLULAR MARROW FOR AGE WITH PLASMA CELL DYSCRASIA  (PLASMA CELLS 25%). 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 Subsequently she was placed on Revlimid & Dexamethasone alone      05/12/2013 Bone Marrow Biopsy    Bone marrow biopsy showed persistent plasma cells. Blood work confirmed VGPR status      06/11/2014 Tumor Marker    Bloodwork show disease relapse      06/26/2014 Procedure    She has placement of port      07/05/2014 - 07/26/2014 Chemotherapy    She received Elotuzumab and Revlimid. Rx was discontinued with elotuzumab per patient preference      08/01/2014 - 08/05/2014 Hospital Admission    She was admitted to the hospital for kyphoplasty and pain management after traumatic compression fracture      08/23/2014 - 04/18/2015 Chemotherapy    She was placed on dexamethasone & Revlimid      04/11/2015 Tumor Marker    Blood work showed VGPR. Treatment was discontinued and she is maintained on Zometa only      10/20/2015 Imaging    MRI spine showed acute/subacute superior endplate compression fractures at T2 and T9 are incompletely healed.       11/05/2015 Procedure    Status post vertebral body augmentation using balloon kyphoplasty at T9 as described without event      12/27/2015 Procedure    She had port placement      01/02/2016 -   Chemotherapy    She received Daratumumab, Dexamethasone and Velcade. Velcade was discontinued after 01/30/16 after less than optimum response is noted and her Rx is switched to Daratumumab, dexamethasone and Pomalidomide      01/20/2016 Imaging    MRi spine showed acute to subacute pathologic fractures of T10 and T11. Up to 70% loss of vertebral body height at the former with mild retropulsion of bone contributing to mild spinal stenosis at the T10-T11 level.      03/16/2016 - 03/17/2016 Hospital Admission    She was admitted to the hospital because of shortness of breath      03/20/2016 Procedure    Status post vertebral body augmentation for painful compression fractures at T10 and T11 using vertebroplasty technique.       03/24/2016 Imaging    CT angiogram showed pulmonary embolus within segmental branches to the right lower lung lobe, and within subsegmental branches to the left upper lobe. Small right and trace left pleural effusions, with underlying interstitial prominence, concerning for mild interstitial edema. She and and atenolol was sitting at the IMA this is okay to stop diffuse coronary artery calcifications seen. Mild biatrial enlargement noted. Scarring at the lung apices, with associated calcification.       Subjective: She was brought in because of sensation of shortness of breath. She was subsequently  found to have pulmonary embolus and was started on heparin. Her shortness of breath and chest heaviness has improved. She underwent kyphoplasty last week which seems to help with her pain. Right now, in bed, she denies pain. Her recent appetite was good on chronic low-dose dexamethasone. According to her, she has gained some appetite. The patient denies any recent signs or symptoms of bleeding such as spontaneous epistaxis, hematuria or hematochezia.   Objective:  Vitals:   03/25/16 2135 03/26/16 0458  BP: (!) 141/64 (!) 152/53  Pulse: 64 87  Resp: 20 (!) 22  Temp: 97.8 F  (36.6 C) 97.3 F (36.3 C)     Intake/Output Summary (Last 24 hours) at 03/26/16 0825 Last data filed at 03/26/16 0458  Gross per 24 hour  Intake              576 ml  Output              700 ml  Net             -124 ml    GENERAL:alert, no distress and comfortable. She looked thin, with oxygen delivered via nasal cannula SKIN: skin color is pale, texture, turgor are normal, no rashes or significant lesions EYES: normal, Conjunctiva are pale and non-injected, sclera clear OROPHARYNX:no exudate, no erythema and lips, buccal mucosa, and tongue normal  NECK: supple, thyroid normal size, non-tender, without nodularity LYMPH:  no palpable lymphadenopathy in the cervical, axillary or inguinal LUNGS: clear to auscultation and percussion with normal breathing effort HEART: regular rate & rhythm and no murmurs and no lower extremity edema ABDOMEN:abdomen soft, non-tender and normal bowel sounds Musculoskeletal:no cyanosis of digits and no clubbing  NEURO: alert & oriented x 3 with fluent speech, no focal motor/sensory deficits   Labs:  Lab Results  Component Value Date   WBC 2.3 (L) 03/26/2016   HGB 8.7 (L) 03/26/2016   HCT 26.2 (L) 03/26/2016   MCV 106.5 (H) 03/26/2016   PLT 217 03/26/2016   NEUTROABS 1.0 (L) 03/25/2016    Lab Results  Component Value Date   NA 133 (L) 03/26/2016   K 4.5 03/26/2016   CL 98 (L) 03/26/2016   CO2 29 03/26/2016    Studies:  Dg Chest 2 View  Result Date: 03/24/2016 CLINICAL DATA:  Shortness of breath and back pain since this morning. Previous hospitalization 1 week ago with a cardiac event. EXAM: CHEST  2 VIEW COMPARISON:  03/16/2016 FINDINGS: Cardiac enlargement. Pulmonary vascularity is decreasing since previous study. No focal consolidation or airspace disease. Slight blunting of costophrenic angles may indicate small pleural effusions. Fibrosis in the lung apices. Apical pleural thickening bilaterally. Tortuous common dilated, and calcified aorta.  Power port type central venous catheter with tip over right atrium. Multiple thoracic vertebral compression deformities post kyphoplasty. IMPRESSION: Cardiac enlargement. Small bilateral pleural effusions. No significant vascular congestion or edema. Electronically Signed   By: Lucienne Capers M.D.   On: 03/24/2016 21:15   Ct Angio Chest Pe W And/or Wo Contrast  Result Date: 03/24/2016 CLINICAL DATA:  Acute onset of shortness of breath. Recent back surgery. Initial encounter. EXAM: CT ANGIOGRAPHY CHEST WITH CONTRAST TECHNIQUE: Multidetector CT imaging of the chest was performed using the standard protocol during bolus administration of intravenous contrast. Multiplanar CT image reconstructions and MIPs were obtained to evaluate the vascular anatomy. CONTRAST:  71.4 mL of Isovue 370 IV contrast COMPARISON:  Chest radiograph performed earlier today at 8:57 p.m., and CTA of the chest performed  08/03/2014 FINDINGS: Cardiovascular: Pulmonary embolus is noted within segmental branches to the right lower lobe, and within subsegmental branches to the left upper lobe. The RV/LV ratio is borderline normal, though this is difficult to assess given the patient's pectus excavatum. Diffuse coronary artery calcifications are seen. Mild biatrial enlargement is suggested. Mild calcification is noted along the aortic arch and descending thoracic aorta. The great vessels are grossly unremarkable in appearance. Mediastinum/Nodes: The patient's right-sided chest port is noted ending about the right atrium. No mediastinal lymphadenopathy is seen. No significant pericardial effusion is identified. The thyroid gland is diminutive, with a prominent right-sided calcification. No axillary lymphadenopathy is seen. Lungs/Pleura: Small right and trace left pleural effusions are noted, with bibasilar atelectasis or scarring. Underlying interstitial prominence raises concern for mild interstitial edema. No pneumothorax is seen. No masses are  identified. Scarring is noted at the lung apices, with associated calcification. Upper Abdomen: The visualized portions of the liver and spleen are grossly unremarkable. Reflux of contrast into the IVC is incidentally seen. Musculoskeletal: No acute osseous abnormalities are identified. There is mild chronic compression deformity of vertebral body T6, and changes of vertebroplasty are noted at T9 through T12. The visualized musculature is unremarkable in appearance. Review of the MIP images confirms the above findings. IMPRESSION: 1. Pulmonary embolus within segmental branches to the right lower lung lobe, and within subsegmental branches to the left upper lobe. 2. Small right and trace left pleural effusions, with underlying interstitial prominence, concerning for mild interstitial edema. 3. Diffuse coronary artery calcifications seen. Mild biatrial enlargement noted. 4. Scarring at the lung apices, with associated calcification. 5. Mild chronic compression deformity of vertebral body T6, and changes of vertebroplasty at T9 through T12. Critical Value/emergent results were called by telephone at the time of interpretation on 03/24/2016 at 10:59 pm to Dr. Shary Decamp, who verbally acknowledged these results. Electronically Signed   By: Garald Balding M.D.   On: 03/24/2016 23:00    Assessment & Plan:   Multiple myeloma in relapse [facility]) The patient has significant complaints with various side effects. She was started on Pomalidomide complicated by severe pancytopenia. She is currently pancytopenic. I recommend holding off treatment. With diagnosis of pulmonary emboli, I do not feel comfortable continuing on treatment. I will discuss plan of care for treatment of multiple myeloma next week as scheduled  Acute PE The cause is multifactorial, related to recent compression fracture, chronic immobility, multiple myeloma in relapse and side effects associated with Pomalidomide She is currently on IV heparin If  she have no evidence of bleeding, she can be transitioned to NOAC I would defer to the pharmacist for counseling As long as her creatinine clearance is greater than 35 ml per minute, I felt comparable for her to be on Eliquis long term  Chronic back pain greater than 3 months duration She has multifactorial chronic back pain. She had recent kyphoplasty which seems to help.  Pancytopenia due to antineoplastic chemotherapy [facility]) She is not symptomatic. I will stop Pomalidomide She does not need transfusion/GCSF  Constipation due to opioid therapy She has chronic constipation  I reinforced the importance of taking her laxatives on the regular basis  Protein-calorie malnutrition, moderate ([place]) She has poor appetite, weight loss and evidence of protein calorie malnutrition. I encouraged her to eat frequent small meals. I will continue her on low dose daily dexamethasone  Mild cardiomyopathy This is stable compared to her last echocardiogram  Discharge planning Hopefully, she can be transitioned to oral anticoagulation  therapy within the next 24-48 hours. She is requiring oxygen therapy. She may need oxygen depending on clinical progress over the next 24-48 hours She has appointment set up to see me next week in the outpatient clinic I will sign off. Please call if questions arise   [place], Fedor Kazmierski, MD 03/26/2016  8:25 AM

## 2016-03-26 NOTE — Progress Notes (Addendum)
ANTICOAGULATION CONSULT NOTE - [place] for Heparin Indication: pulmonary embolus  Allergies  Allergen Reactions  . No Known Allergies     Patient Measurements: Height: 5' 4"  (162.6 cm) Weight: 109 lb 2 oz (49.5 kg) IBW/kg (Calculated) : 54.7 Heparin Dosing Weight: actual body weight  Vital Signs: Temp: 97.3 F (36.3 C) (09/21 0458) Temp Source: Oral (09/21 0458) BP: 152/53 (09/21 0458) Pulse Rate: 87 (09/21 0458)  Labs:  Recent Labs  03/24/16 2110 03/25/16 0530 03/25/16 0935 03/25/16 1834 03/26/16 0557  HGB 9.0* 8.2*  --   --  8.7*  HCT 26.6* 24.2*  --   --  26.2*  PLT 215 187  --   --  217  APTT 34  --   --   --   --   LABPROT 13.8  --   --   --   --   INR 1.06  --   --   --   --   HEPARINUNFRC  --   --  0.44 0.58 0.69  CREATININE 0.76 0.55  --   --  0.52  TROPONINI  --  <0.03  --   --   --     Estimated Creatinine Clearance: 38 mL/min (by C-G formula based on SCr of 0.52 mg/dL).   Medical History: Past Medical History:  Diagnosis Date  . Aortic regurgitation   . Breast cancer ([place])   . Coronary artery disease   . DCIS (ductal carcinoma in situ) 08/04/2012  . Depression 09/05/2014  . Dysphagia 01/23/2016  . Hot flash not due to menopause 12/23/2011  . HTN (hypertension)   . Hypothyroidism 10/18/2014  . Multiple myeloma   . Multiple myeloma ([place]) 10/11/2015  . Neuropathy ([place]) 12/23/2011  . Spinal fracture of T12 vertebra ([facility])    vertebral fx    Assessment: 37 y/oF with PMH of MM, remote hx of breast cancer, chronic pancytopenia, HTN, CAD, recent kyphoplasty who presented to [facility] ED on 03/24/16 with chest pain and SOB. CTa of chest positive for bilateral PE. Pomalidomide stopped, as can cause VTE. Pharmacy consulted to dose heparin infusion. Baseline coags WNL.  Today, 03/26/16:  DHL: 0.69 units/mL, therapeutic on heparin infusion at 800 units/hr  CBC: Hgb low, but relatively stable. Pltc WNL.  No bleeding or line issues  reported per nursing  Bilateral lower extremity venous duplex (-) for DVT  Goal of Therapy:  Heparin level 0.3-0.7 units/ml Monitor platelets by anticoagulation protocol: Yes   Plan:  Continue heparin infusion at 800 units/hr. Daily CBC, heparin level while on heparin infusion. Monitor for s/sx of bleeding.  Per oncology will transition to eliquis soon   Dolly Rias RPh 03/26/2016, 10:23 AM Pager [phone]   PM UPDATE:consulted to start apixaban Plan: -Stop heparin drip and start apixaban 52m po bid x 7 days then take apixaban 588mpo bid thereafter - pharmacy to provide education - f/u CBC, s/sx of bleeding  ElDolly RiasPh 03/26/2016, 2:01 PM Pager [phone]

## 2016-03-26 NOTE — Progress Notes (Signed)
Patient ID: CASSAUNDRA RASCH, female   DOB: September 10, 1925, 82 y.o.   MRN: [id number]    PROGRESS NOTE    INNA TISDELL  WYO:[id number] DOB: 05-17-1926 DOA: 03/24/2016  PCP: Thressa Sheller, MD   Brief Narrative:  Pt admitted for evaluation of dyspnea, noted to have pulmonary embolus, started on heparin drip.   Assessment & Plan:   Active Problems:   Acute hypoxic respiratory failure with hypoxia - secondary to PE - still unable to taper off oxygen yet  - keep on [place] with low threshold to place on NRB if unable to keep O2 sat > 90% - keep on Heparin drip with plan to transition to Eliquis     Multiple myeloma in relapse ([place]) - per Dr. Alvy Bimler, pt started on Pomalidomidecomplicated by severe pancytopenia, recommend holding off treatment and due to diagnosis of pulmonary emboli, Dr. Ruffin Pyo does not feel comfortable continuing on treatment.    Chronic back pain greater than 3 months duration - s/p recent kyphoplasty which seems to help. - allow analgesia as needed     Pancytopenia due to antineoplastic chemotherapy ([place]) - not symptomatic, no indication for transfusion at this time  - CBC in AM    Constipation due to opioid therapy - use laxatives as needed     Protein-calorie malnutrition, moderate (H[facility]) - continue low dose dexamethasone per oncology    DVT prophylaxis: On Heparin drip  Code Status: Full  Family Communication: Patient and daughter at bedside, rounding team including myself, RN, CM, pharmacist Disposition Plan: Home in 1-2 days if tolerating Eliquis well   Consultants:   Oncology   Procedures:   None   Antimicrobials:   None   Subjective: Less dyspnea this AM.   Objective: Vitals:   03/25/16 2135 03/26/16 0458 03/26/16 1030 03/26/16 1040  BP: (!) 141/64 (!) 152/53    Pulse: 64 87    Resp: 20 (!) 22    Temp: 97.8 F (36.6 C) 97.3 F (36.3 C)    TempSrc: Oral Oral    SpO2: 100% 98% 96% 99%  Weight:      Height:        Intake/Output  Summary (Last 24 hours) at 03/26/16 1339 Last data filed at 03/26/16 1100  Gross per 24 hour  Intake              816 ml  Output              400 ml  Net              416 ml   Filed Weights   03/25/16 0123  Weight: 49.5 kg (109 lb 2 oz)    Examination:  General exam: Appears to be calm, NAD Respiratory system: Respiratory effort normal. Diminished breath sounds at bases  Cardiovascular system: S1 & S2 heard, RRR. No JVD, murmurs, rubs, gallops or clicks. No pedal edema. Gastrointestinal system: Abdomen is nondistended, soft and nontender. No organomegaly or masses felt.   Data Reviewed: I have personally reviewed following labs and imaging studies  CBC:  Recent Labs Lab 03/24/16 2110 03/25/16 0530 03/26/16 0557  WBC 2.5* 2.5* 2.3*  NEUTROABS  --  1.0*  --   HGB 9.0* 8.2* 8.7*  HCT 26.6* 24.2* 26.2*  MCV 105.6* 102.5* 106.5*  PLT 215 187 774   Basic Metabolic Panel:  Recent Labs Lab 03/24/16 2110 03/25/16 0530 03/26/16 0557  NA 131* 131* 133*  K 4.4 4.1 4.5  CL 95* 95* 98*  CO2 28 28 29   GLUCOSE 117* 93 85  BUN 27* 23* 18  CREATININE 0.76 0.55 0.52  CALCIUM 8.5* 8.1* 8.2*   Liver Function Tests:  Recent Labs Lab 03/25/16 0530  AST 21  ALT 19  ALKPHOS 69  BILITOT 0.5  PROT 5.7*  ALBUMIN 2.8*   Coagulation Profile:  Recent Labs Lab 03/24/16 2110  INR 1.06   Cardiac Enzymes:  Recent Labs Lab 03/25/16 0530  TROPONINI <0.03   Urine analysis:    Component Value Date/Time   COLORURINE YELLOW 08/01/2014 1820   APPEARANCEUR CLEAR 08/01/2014 1820   LABSPEC 1.009 08/01/2014 1820   PHURINE 6.5 08/01/2014 1820   GLUCOSEU NEGATIVE 08/01/2014 1820   HGBUR SMALL (A) 08/01/2014 1820   BILIRUBINUR NEGATIVE 08/01/2014 1820   KETONESUR 40 (A) 08/01/2014 1820   PROTEINUR NEGATIVE 08/01/2014 1820   UROBILINOGEN 0.2 08/01/2014 1820   NITRITE NEGATIVE 08/01/2014 1820   LEUKOCYTESUR NEGATIVE 08/01/2014 1820   Radiology Studies: Dg Chest 2  View  Result Date: 03/24/2016 CLINICAL DATA:  Shortness of breath and back pain since this morning. Previous hospitalization 1 week ago with a cardiac event. EXAM: CHEST  2 VIEW COMPARISON:  03/16/2016 FINDINGS: Cardiac enlargement. Pulmonary vascularity is decreasing since previous study. No focal consolidation or airspace disease. Slight blunting of costophrenic angles may indicate small pleural effusions. Fibrosis in the lung apices. Apical pleural thickening bilaterally. Tortuous common dilated, and calcified aorta. Power port type central venous catheter with tip over right atrium. Multiple thoracic vertebral compression deformities post kyphoplasty. IMPRESSION: Cardiac enlargement. Small bilateral pleural effusions. No significant vascular congestion or edema. Electronically Signed   By: Lucienne Capers M.D.   On: 03/24/2016 21:15   Ct Angio Chest Pe W And/or Wo Contrast  Result Date: 03/24/2016 CLINICAL DATA:  Acute onset of shortness of breath. Recent back surgery. Initial encounter. EXAM: CT ANGIOGRAPHY CHEST WITH CONTRAST TECHNIQUE: Multidetector CT imaging of the chest was performed using the standard protocol during bolus administration of intravenous contrast. Multiplanar CT image reconstructions and MIPs were obtained to evaluate the vascular anatomy. CONTRAST:  71.4 mL of Isovue 370 IV contrast COMPARISON:  Chest radiograph performed earlier today at 8:57 p.m., and CTA of the chest performed 08/03/2014 FINDINGS: Cardiovascular: Pulmonary embolus is noted within segmental branches to the right lower lobe, and within subsegmental branches to the left upper lobe. The RV/LV ratio is borderline normal, though this is difficult to assess given the patient's pectus excavatum. Diffuse coronary artery calcifications are seen. Mild biatrial enlargement is suggested. Mild calcification is noted along the aortic arch and descending thoracic aorta. The great vessels are grossly unremarkable in appearance.  Mediastinum/Nodes: The patient's right-sided chest port is noted ending about the right atrium. No mediastinal lymphadenopathy is seen. No significant pericardial effusion is identified. The thyroid gland is diminutive, with a prominent right-sided calcification. No axillary lymphadenopathy is seen. Lungs/Pleura: Small right and trace left pleural effusions are noted, with bibasilar atelectasis or scarring. Underlying interstitial prominence raises concern for mild interstitial edema. No pneumothorax is seen. No masses are identified. Scarring is noted at the lung apices, with associated calcification. Upper Abdomen: The visualized portions of the liver and spleen are grossly unremarkable. Reflux of contrast into the IVC is incidentally seen. Musculoskeletal: No acute osseous abnormalities are identified. There is mild chronic compression deformity of vertebral body T6, and changes of vertebroplasty are noted at T9 through T12. The visualized musculature is unremarkable in appearance. Review of the MIP images confirms the above  findings. IMPRESSION: 1. Pulmonary embolus within segmental branches to the right lower lung lobe, and within subsegmental branches to the left upper lobe. 2. Small right and trace left pleural effusions, with underlying interstitial prominence, concerning for mild interstitial edema. 3. Diffuse coronary artery calcifications seen. Mild biatrial enlargement noted. 4. Scarring at the lung apices, with associated calcification. 5. Mild chronic compression deformity of vertebral body T6, and changes of vertebroplasty at T9 through T12. Critical Value/emergent results were called by telephone at the time of interpretation on 03/24/2016 at 10:59 pm to Dr. Shary Decamp, who verbally acknowledged these results. Electronically Signed   By: Garald Balding M.D.   On: 03/24/2016 23:00      Scheduled Meds: . aspirin EC  81 mg Oral Daily  . cholecalciferol  1,000 Units Oral Daily  . dexamethasone  4  mg Oral Daily  . gabapentin  300 mg Oral TID  . irbesartan  150 mg Oral Daily  . levothyroxine  50 mcg Oral QAC breakfast  . mirtazapine  7.5 mg Oral QHS  . morphine  60 mg Oral Q12H  . multivitamin with minerals  1 tablet Oral Daily  . polyvinyl alcohol  2 drop Both Eyes BID  . sodium chloride flush  3 mL Intravenous Q12H   Continuous Infusions: . heparin 800 Units/hr (03/26/16 0452)     LOS: 2 days   Time spent: 20 minutes   Faye Ramsay, MD [facility] Pager [phone]  If 7PM-7AM, please contact night-coverage [email or web address] Password [email or web address] 03/26/2016, 1:39 PM

## 2016-03-27 LAB — CBC
HCT: 27.5 % — ABNORMAL LOW (ref 36.0–46.0)
HEMOGLOBIN: 9.1 g/dL — AB (ref 12.0–15.0)
MCH: 35.8 pg — AB (ref 26.0–34.0)
MCHC: 33.1 g/dL (ref 30.0–36.0)
MCV: 108.3 fL — AB (ref 78.0–100.0)
Platelets: 280 10*3/uL (ref 150–400)
RBC: 2.54 MIL/uL — AB (ref 3.87–5.11)
RDW: 16.6 % — ABNORMAL HIGH (ref 11.5–15.5)
WBC: 3.4 10*3/uL — AB (ref 4.0–10.5)

## 2016-03-27 LAB — BASIC METABOLIC PANEL
ANION GAP: 4 — AB (ref 5–15)
BUN: 16 mg/dL (ref 6–20)
CHLORIDE: 98 mmol/L — AB (ref 101–111)
CO2: 31 mmol/L (ref 22–32)
Calcium: 8.4 mg/dL — ABNORMAL LOW (ref 8.9–10.3)
Creatinine, Ser: 0.66 mg/dL (ref 0.44–1.00)
GFR calc Af Amer: >60 mL/min (ref >60)
Glucose, Bld: 101 mg/dL — ABNORMAL HIGH (ref 65–99)
POTASSIUM: 4.8 mmol/L (ref 3.5–5.1)
SODIUM: 133 mmol/L — AB (ref 135–145)

## 2016-03-27 MED ORDER — SALINE SPRAY 0.65 % NA SOLN
1.0000 | NASAL | Status: DC | PRN
  Administered 2016-03-28: 1 via NASAL
  Filled 2016-03-27: qty 44

## 2016-03-27 MED ORDER — POLYETHYLENE GLYCOL 3350 17 G PO PACK
17.0000 g | PACK | Freq: Every day | ORAL | Status: DC | PRN
  Administered 2016-03-27 – 2016-03-28 (×2): 17 g via ORAL
  Filled 2016-03-27 (×2): qty 1

## 2016-03-27 NOTE — Progress Notes (Signed)
Patient ID: Natasha Jackson, female   DOB: [birth year], 82 y.o.   MRN: [id number]    PROGRESS NOTE    Natasha Jackson  GQB:[id number] DOB: 09/05/1925 DOA: 03/24/2016  PCP: Thressa Sheller, MD   Brief Narrative:  Pt admitted for evaluation of dyspnea, noted to have pulmonary embolus, started on heparin drip.   Assessment & Plan:   Active Problems:   Acute hypoxic respiratory failure with hypoxia - secondary to PE - still unable to taper off oxygen yet and will likely require oxygen upon discharge  - keep on [place] with low threshold to place on NRB if unable to keep O2 sat > 90% - transitioned to Eliquis and so far tolerating well     Multiple myeloma in relapse ([place]) - per Dr. Alvy Bimler, pt started on Pomalidomidecomplicated by severe pancytopenia, recommend holding off treatment and due to diagnosis of pulmonary emboli, Dr. Ruffin Pyo does not feel comfortable continuing on treatment.    Chronic back pain greater than 3 months duration - s/p recent kyphoplasty which seems to help. - allow analgesia as needed     Pancytopenia due to antineoplastic chemotherapy ([place]) - not symptomatic, no indication for transfusion at this time  - CBC in AM    Constipation due to opioid therapy - no BM yet and will place on bowel regimen - if pt has BM, can likely be discharge d/c in AM    Protein-calorie malnutrition, moderate (HCC) - continue low dose dexamethasone per oncology  - still rather poor appetite but pt says this is fist time since admission she actually feels better so she is hopeful to start eating more    DVT prophylaxis: Eliquis  Code Status: Full  Family Communication: Patient and daughter at bedside, rounding team including myself, RN, CM, pharmacist Disposition Plan: Home in am if pt has BM and better oral intake   Consultants:   Oncology   Procedures:   None   Antimicrobials:   None   Subjective: Less dyspnea this AM.   Objective: Vitals:   03/26/16 1413 03/26/16  2157 03/27/16 0410 03/27/16 0900  BP: (!) 144/45 (!) 149/65 (!) 171/70 (!) 126/49  Pulse: 64 65 81 72  Resp: 20 20 20 18   Temp: 98.1 F (36.7 C) 97.5 F (36.4 C) 97.6 F (36.4 C) 98.4 F (36.9 C)  TempSrc: Oral Oral Oral Oral  SpO2: 100% 100% 90% 100%  Weight:      Height:        Intake/Output Summary (Last 24 hours) at 03/27/16 1310 Last data filed at 03/27/16 0416  Gross per 24 hour  Intake            392.8 ml  Output              450 ml  Net            -57.2 ml   Filed Weights   03/25/16 0123  Weight: 49.5 kg (109 lb 2 oz)    Examination:  General exam: Appears to be calm, NAD Respiratory system: Respiratory effort normal. Diminished breath sounds at bases  Cardiovascular system: S1 & S2 heard, RRR. No JVD, murmurs, rubs, gallops or clicks. No pedal edema. Gastrointestinal system: Abdomen is nondistended, soft and nontender. No organomegaly or masses felt.   Data Reviewed: I have personally reviewed following labs and imaging studies  CBC:  Recent Labs Lab 03/24/16 2110 03/25/16 0530 03/26/16 0557 03/27/16 0415  WBC 2.5* 2.5* 2.3* 3.4*  NEUTROABS  --  1.0*  --   --   HGB 9.0* 8.2* 8.7* 9.1*  HCT 26.6* 24.2* 26.2* 27.5*  MCV 105.6* 102.5* 106.5* 108.3*  PLT 215 187 217 768   Basic Metabolic Panel:  Recent Labs Lab 03/24/16 2110 03/25/16 0530 03/26/16 0557 03/27/16 0415  NA 131* 131* 133* 133*  K 4.4 4.1 4.5 4.8  CL 95* 95* 98* 98*  CO2 28 28 29 31   GLUCOSE 117* 93 85 101*  BUN 27* 23* 18 16  CREATININE 0.76 0.55 0.52 0.66  CALCIUM 8.5* 8.1* 8.2* 8.4*   Liver Function Tests:  Recent Labs Lab 03/25/16 0530  AST 21  ALT 19  ALKPHOS 69  BILITOT 0.5  PROT 5.7*  ALBUMIN 2.8*   Coagulation Profile:  Recent Labs Lab 03/24/16 2110  INR 1.06   Cardiac Enzymes:  Recent Labs Lab 03/25/16 0530  TROPONINI <0.03   Urine analysis:    Component Value Date/Time   COLORURINE YELLOW 08/01/2014 [place] 08/01/2014 1820    LABSPEC 1.009 08/01/2014 1820   PHURINE 6.5 08/01/2014 1820   GLUCOSEU NEGATIVE 08/01/2014 1820   HGBUR SMALL (A) 08/01/2014 1820   BILIRUBINUR NEGATIVE 08/01/2014 1820   KETONESUR 40 (A) 08/01/2014 1820   PROTEINUR NEGATIVE 08/01/2014 1820   UROBILINOGEN 0.2 08/01/2014 1820   NITRITE NEGATIVE 08/01/2014 1820   LEUKOCYTESUR NEGATIVE 08/01/2014 1820   Radiology Studies: Dg Chest 2 View  Result Date: 03/24/2016 CLINICAL DATA:  Shortness of breath and back pain since this morning. Previous hospitalization 1 week ago with a cardiac event. EXAM: CHEST  2 VIEW COMPARISON:  03/16/2016 FINDINGS: Cardiac enlargement. Pulmonary vascularity is decreasing since previous study. No focal consolidation or airspace disease. Slight blunting of costophrenic angles may indicate small pleural effusions. Fibrosis in the lung apices. Apical pleural thickening bilaterally. Tortuous common dilated, and calcified aorta. Power port type central venous catheter with tip over right atrium. Multiple thoracic vertebral compression deformities post kyphoplasty. IMPRESSION: Cardiac enlargement. Small bilateral pleural effusions. No significant vascular congestion or edema. Electronically Signed   By: Lucienne Capers M.D.   On: 03/24/2016 21:15   Ct Angio Chest Pe W And/or Wo Contrast  Result Date: 03/24/2016 CLINICAL DATA:  Acute onset of shortness of breath. Recent back surgery. Initial encounter. EXAM: CT ANGIOGRAPHY CHEST WITH CONTRAST TECHNIQUE: Multidetector CT imaging of the chest was performed using the standard protocol during bolus administration of intravenous contrast. Multiplanar CT image reconstructions and MIPs were obtained to evaluate the vascular anatomy. CONTRAST:  71.4 mL of Isovue 370 IV contrast COMPARISON:  Chest radiograph performed earlier today at 8:57 p.m., and CTA of the chest performed 08/03/2014 FINDINGS: Cardiovascular: Pulmonary embolus is noted within segmental branches to the right lower lobe,  and within subsegmental branches to the left upper lobe. The RV/LV ratio is borderline normal, though this is difficult to assess given the patient's pectus excavatum. Diffuse coronary artery calcifications are seen. Mild biatrial enlargement is suggested. Mild calcification is noted along the aortic arch and descending thoracic aorta. The great vessels are grossly unremarkable in appearance. Mediastinum/Nodes: The patient's right-sided chest port is noted ending about the right atrium. No mediastinal lymphadenopathy is seen. No significant pericardial effusion is identified. The thyroid gland is diminutive, with a prominent right-sided calcification. No axillary lymphadenopathy is seen. Lungs/Pleura: Small right and trace left pleural effusions are noted, with bibasilar atelectasis or scarring. Underlying interstitial prominence raises concern for mild interstitial edema. No pneumothorax is seen. No masses are identified. Scarring is  noted at the lung apices, with associated calcification. Upper Abdomen: The visualized portions of the liver and spleen are grossly unremarkable. Reflux of contrast into the IVC is incidentally seen. Musculoskeletal: No acute osseous abnormalities are identified. There is mild chronic compression deformity of vertebral body T6, and changes of vertebroplasty are noted at T9 through T12. The visualized musculature is unremarkable in appearance. Review of the MIP images confirms the above findings. IMPRESSION: 1. Pulmonary embolus within segmental branches to the right lower lung lobe, and within subsegmental branches to the left upper lobe. 2. Small right and trace left pleural effusions, with underlying interstitial prominence, concerning for mild interstitial edema. 3. Diffuse coronary artery calcifications seen. Mild biatrial enlargement noted. 4. Scarring at the lung apices, with associated calcification. 5. Mild chronic compression deformity of vertebral body T6, and changes of  vertebroplasty at T9 through T12. Critical Value/emergent results were called by telephone at the time of interpretation on 03/24/2016 at 10:59 pm to Dr. Shary Decamp, who verbally acknowledged these results. Electronically Signed   By: Garald Balding M.D.   On: 03/24/2016 23:00      Scheduled Meds: . apixaban  10 mg Oral BID   Followed by  . [START ON 04/02/2016] apixaban  5 mg Oral BID  . aspirin EC  81 mg Oral Daily  . cholecalciferol  1,000 Units Oral Daily  . dexamethasone  4 mg Oral Daily  . gabapentin  300 mg Oral TID  . irbesartan  150 mg Oral Daily  . levothyroxine  50 mcg Oral QAC breakfast  . mirtazapine  7.5 mg Oral QHS  . morphine  60 mg Oral Q12H  . multivitamin with minerals  1 tablet Oral Daily  . polyvinyl alcohol  2 drop Both Eyes BID  . sodium chloride flush  3 mL Intravenous Q12H   Continuous Infusions:     LOS: 3 days   Time spent: 20 minutes   Faye Ramsay, MD [facility] Pager [phone]  If 7PM-7AM, please contact night-coverage [email or web address] Password [email or web address] 03/27/2016, 1:10 PM

## 2016-03-27 NOTE — Care Management Note (Signed)
Case Management Note  Patient Details  Name: Natasha Jackson MRN: [id number] Date of Birth: 31-May-1926  Subjective/Objective:   PT-recc HHC, home 02-await documented 02 sats for qualifying status. Patient chose [facility]-used in the past. HHRN-disease mgmnt, HHPT,f66f order-await orders. Await home 02 order if qualifies. [facility] rep Manuela Schwartz aware, & [facility] dme rep aware.                 Action/Plan:d/c home w/HHC/home 02   Expected Discharge Date:                  Expected Discharge Plan:  [place]  In-House Referral:     Discharge planning Services  CM Consult  Post Acute Care Choice:    Choice offered to:  Patient  DME Arranged:    DME Agency:     [facility] Arranged:    [place] Agency:  [facility]  Status of Service:  In process, will continue to follow  If discussed at Long Length of Stay Meetings, dates discussed:    Additional Comments:  Dessa Phi, RN 03/27/2016, 11:51 AM

## 2016-03-27 NOTE — Discharge Instructions (Signed)
Information on my medicine - ELIQUIS (apixaban)  This medication education was reviewed with me or my healthcare representative as part of my discharge preparation.  The pharmacist that spoke with me during my hospital stay was:  Angela Adam [facility]  Why was Eliquis prescribed for you? Eliquis was prescribed to treat blood clots that may have been found in the veins of your legs (deep vein thrombosis) or in your lungs (pulmonary embolism) and to reduce the risk of them occurring again.  What do You need to know about Eliquis ? The starting dose is 10 mg (two 5 mg tablets) taken TWICE daily for the FIRST SEVEN (7) DAYS, then on (enter date)  04/02/16  the dose is reduced to ONE 5 mg tablet taken TWICE daily.  Eliquis may be taken with or without food.   Try to take the dose about the same time in the morning and in the evening. If you have difficulty swallowing the tablet whole please discuss with your pharmacist how to take the medication safely.  Take Eliquis exactly as prescribed and DO NOT stop taking Eliquis without talking to the doctor who prescribed the medication.  Stopping may increase your risk of developing a new blood clot.  Refill your prescription before you run out.  After discharge, you should have regular check-up appointments with your healthcare provider that is prescribing your Eliquis.    What do you do if you miss a dose? If a dose of ELIQUIS is not taken at the scheduled time, take it as soon as possible on the same day and twice-daily administration should be resumed. The dose should not be doubled to make up for a missed dose.  Important Safety Information A possible side effect of Eliquis is bleeding. You should call your healthcare provider right away if you experience any of the following: ? Bleeding from an injury or your nose that does not stop. ? Unusual colored urine (red or dark brown) or unusual colored stools (red or black). ? Unusual bruising  for unknown reasons. ? A serious fall or if you hit your head (even if there is no bleeding).  Some medicines may interact with Eliquis and might increase your risk of bleeding or clotting while on Eliquis. To help avoid this, consult your healthcare provider or pharmacist prior to using any new prescription or non-prescription medications, including herbals, vitamins, non-steroidal anti-inflammatory drugs (NSAIDs) and supplements.  This website has more information on Eliquis (apixaban): http://www.eliquis.com/eliquis/home

## 2016-03-27 NOTE — Care Management Note (Signed)
Case Management Note  Patient Details  Name: AOI GODETTE MRN: [id number] Date of Birth: Jul 29, 1925  Subjective/Objective:  [place] orders, & Home 02 orders in-[facility] rep-Susan aware, & DME rep-Jermaine aware-will delivery to rm prior d/c.                  Action/Plan:d/c home w/HHC/home 02   Expected Discharge Date:                  Expected Discharge Plan:  [place]  In-House Referral:     Discharge planning Services  CM Consult  Post Acute Care Choice:    Choice offered to:  Patient  DME Arranged:  [facility] DME Agency:  [place] Arranged:  RN, PT, OT [facility] Agency:  [place]  Status of Service:  In process, will continue to follow  If discussed at Long Length of Stay Meetings, dates discussed:    Additional Comments:  Dessa Phi, RN 03/27/2016, 3:11 PM

## 2016-03-27 NOTE — Evaluation (Signed)
Physical Therapy Evaluation Patient Details Name: Natasha Jackson MRN: [id number] DOB: Jan 10, 1926 Today's Date: 03/27/2016   History of Present Illness  82 year old female with a history of aortic regurgitation, coronary artery disease, hypertension and multiple myeloma with current relapse, hx of breast cancer s/p lumpectomy/chemo/XRT, mild LV dysfunction w/ no previous clinical CHF (45-50%), modAR/mildMR, hypothyroidism, bradycardia and LBBB and admitted for acute hypoxic respiratory failure with hypoxia secondary to PE   Clinical Impression  Pt admitted with above diagnosis. Pt currently with functional limitations due to the deficits listed below (see PT Problem List).  Pt will benefit from skilled PT to increase their independence and safety with mobility to allow discharge to the venue listed below.  Pt assisted with ambulating in hallway and requiring supplemental oxygen at this time, see below.  Pt agreeable to HHPT (would like [facility]).  SATURATION QUALIFICATIONS: (This note is used to comply with regulatory documentation for home oxygen)  Patient Saturations on Room Air at Rest = 75%  Patient Saturations on Room Air while Ambulating = N/A  Patient Saturations on 3 Liters of oxygen while Ambulating = 84-97%  Please briefly explain why patient needs home oxygen: to improve oxygen saturations at rest and during activity     Follow Up Recommendations Home health PT;Supervision for mobility/OOB    Equipment Recommendations  None recommended by PT    Recommendations for Other Services       Precautions / Restrictions Precautions Precautions: Fall Precaution Comments: monitor sats Restrictions Weight Bearing Restrictions: No      Mobility  Bed Mobility               General bed mobility comments: pt sitting EOB on arrival  Transfers Overall transfer level: Needs assistance Equipment used: Rolling walker (2 wheeled) Transfers: Sit to/from Stand Sit to Stand: Min  guard         General transfer comment: min/guard for safety, verbal cues for hand placement  Ambulation/Gait Ambulation/Gait assistance: Min guard Ambulation Distance (Feet): 140 Feet Assistive device: Rolling walker (2 wheeled) Gait Pattern/deviations: Step-through pattern;Decreased stride length     General Gait Details: required seated rest break due to SOB, SPO2 84% on 3L O2 [place] during gait, pt with shaky extremities and reports weakness in LEs upon returning to room  Stairs            Wheelchair Mobility    Modified Rankin (Stroke Patients Only)       Balance                                             Pertinent Vitals/Pain Pain Assessment: 0-10 Pain Score: 4  Pain Location: mid back and rib Pain Descriptors / Indicators: Discomfort;Constant Pain Intervention(s): Limited activity within patient's tolerance;Monitored during session;Premedicated before session;Repositioned    Home Living Family/patient expects to be discharged to:: Private residence Living Arrangements: Spouse/significant other;Children Available Help at Discharge: Family;Available 24 hours/day Type of Home: House Home Access: Stairs to enter     Home Layout: One level Home Equipment: Environmental consultant - 2 wheels;Wheelchair - manual;Bedside commode      Prior Function Level of Independence: Independent with assistive device(s)               Hand Dominance        Extremity/Trunk Assessment  Lower Extremity Assessment: Generalized weakness      Cervical / Trunk Assessment: Kyphotic  Communication   Communication: No difficulties  Cognition Arousal/Alertness: Awake/alert Behavior During Therapy: WFL for tasks assessed/performed Overall Cognitive Status: Within Functional Limits for tasks assessed                      General Comments      Exercises     Assessment/Plan    PT Assessment Patient needs continued PT services  PT  Problem List Decreased strength;Decreased activity tolerance;Decreased mobility;Cardiopulmonary status limiting activity          PT Treatment Interventions DME instruction;Gait training;Functional mobility training;Therapeutic activities;Therapeutic exercise;Patient/family education    PT Goals (Current goals can be found in the Care Plan section)  Acute Rehab PT Goals PT Goal Formulation: With patient Time For Goal Achievement: 04/03/16 Potential to Achieve Goals: Good    Frequency Min 3X/week   Barriers to discharge        Co-evaluation               End of Session Equipment Utilized During Treatment: Gait belt;Oxygen Activity Tolerance: Patient limited by fatigue Patient left: in chair;with call bell/phone within reach Nurse Communication: Mobility status         Time: 1110-1130 PT Time Calculation (min) (ACUTE ONLY): 20 min   Charges:   PT Evaluation $PT Eval Moderate Complexity: 1 Procedure     PT G Codes:        Ellaina Schuler,KATHrine E 03/27/2016, 12:08 PM Carmelia Bake, PT, DPT 03/27/2016 Pager: [phone]

## 2016-03-28 DIAGNOSIS — J9601 Acute respiratory failure with hypoxia: Secondary | ICD-10-CM

## 2016-03-28 LAB — BASIC METABOLIC PANEL
ANION GAP: 5 (ref 5–15)
BUN: 13 mg/dL (ref 6–20)
CHLORIDE: 98 mmol/L — AB (ref 101–111)
CO2: 29 mmol/L (ref 22–32)
Calcium: 8.1 mg/dL — ABNORMAL LOW (ref 8.9–10.3)
Creatinine, Ser: 0.51 mg/dL (ref 0.44–1.00)
GFR calc non Af Amer: >60 mL/min (ref >60)
GLUCOSE: 101 mg/dL — AB (ref 65–99)
POTASSIUM: 4.6 mmol/L (ref 3.5–5.1)
Sodium: 132 mmol/L — ABNORMAL LOW (ref 135–145)

## 2016-03-28 LAB — CBC
HEMATOCRIT: 23.7 % — AB (ref 36.0–46.0)
HEMOGLOBIN: 7.8 g/dL — AB (ref 12.0–15.0)
MCH: 34.8 pg — ABNORMAL HIGH (ref 26.0–34.0)
MCHC: 32.9 g/dL (ref 30.0–36.0)
MCV: 105.8 fL — AB (ref 78.0–100.0)
Platelets: 239 10*3/uL (ref 150–400)
RBC: 2.24 MIL/uL — ABNORMAL LOW (ref 3.87–5.11)
RDW: 16 % — AB (ref 11.5–15.5)
WBC: 2.9 10*3/uL — AB (ref 4.0–10.5)

## 2016-03-28 MED ORDER — SENNOSIDES-DOCUSATE SODIUM 8.6-50 MG PO TABS
1.0000 | ORAL_TABLET | Freq: Two times a day (BID) | ORAL | Status: DC
  Administered 2016-03-28 – 2016-03-29 (×3): 1 via ORAL
  Filled 2016-03-28 (×3): qty 1

## 2016-03-28 MED ORDER — FOLIC ACID 1 MG PO TABS
1.0000 mg | ORAL_TABLET | Freq: Every day | ORAL | Status: DC
  Administered 2016-03-28 – 2016-03-29 (×2): 1 mg via ORAL
  Filled 2016-03-28 (×2): qty 1

## 2016-03-28 MED ORDER — VITAMIN B-12 1000 MCG PO TABS
1000.0000 ug | ORAL_TABLET | Freq: Every day | ORAL | Status: DC
  Administered 2016-03-28 – 2016-03-29 (×2): 1000 ug via ORAL
  Filled 2016-03-28 (×2): qty 1

## 2016-03-28 NOTE — Progress Notes (Signed)
[facility] PROGRESS NOTE  Natasha Jackson PRX:[id number] DOB: [birth year] DOA: 03/24/2016 PCP: Thressa Sheller, MD  Active Problems:   Multiple myeloma in relapse (HCC)   LBBB (left bundle branch block)   Vertebral compression fracture (HCC)   Anemia in neoplastic disease   Quality of life palliative care encounter   Pulmonary embolism (HCC)   Acute respiratory failure with hypoxia (HCC)   Pulmonary embolus (HCC)    Brief Narrative:  82 y.o. woman with a history of MM (active treatment), chronic pancytopenia, HTN, CAD, systolic heart failure, neuropathy, thyroid disease, and remote breast cancer who was in her baseline state of health until almost two weeks ago.  She developed intermittent episodes of chest pain and shortness of breath.  She presented to [facility] on 9/11 and was admitted for an observation stay.  She was thought to have a mild CHF exacerbation, and she was admitted for gentle diuresis.  She was discharged in stable condition and was cleared to have her kyphoplasty.  The patient tolerated this procedure on Friday.  Symptoms returned by Saturday, and have been escalating in intensity and frequency, which prompted presentation to the ED today (even though she was seen in cardiology clinic today as well).   ED Course: CTA of the chest shows bilateral PE, segmental involvement to RLL and subsegmental involvement to LUL.  She has been started on heparin infusion per protocol.  She is requiring 2L [place] which is new due to O2 sat 84% on RA upon presentation.  O2 sat 96% with 2L [place].  She is anxious and rates her pain 7-8 out of 10 currently.  Hospitalist asked to admit. Pt admitted for evaluation of dyspnea, noted to have pulmonary embolus, started on heparin drip.   Assessment & Plan:   Active Problems:   Acute hypoxic respiratory failure with hypoxia - secondary to PE - still unable to taper off oxygen yet and will require oxygen upon discharge  - transitioned to Eliquis  and so far tolerating well. Mild anemia. No obvious s/s of bleeding. Will recheck Hg AM    Multiple myeloma in relapse [facility]) - per Dr. Alvy Bimler, pt started on Pomalidomidecomplicated by severe pancytopenia, recommend holding off treatment and due to diagnosis of pulmonary emboli, Dr. Ruffin Pyo does not feel comfortable continuing on treatment.    Chronic back pain greater than 3 months duration - s/p recent kyphoplasty which seems to help. - allow analgesia as needed     Pancytopenia due to antineoplastic chemotherapy ([place]) - not symptomatic, no indication for transfusion at this time. But some drop in Hg. Will recheck AM     Constipation due to opioid therapy - no BM yet and will place on bowel regimen    Protein-calorie malnutrition, moderate (HCC) - continue low dose dexamethasone per oncology  - still rather poor appetite but pt says this is fist time since admission she actually feels better so she is hopeful to start eating more   Disposition: still reports dizziness, DOE. Some drop in Hg. Monitor 24 hrs    Code Status: partial  Family Communication: d/w patient, her daughter, family  (indicate person spoken with, relationship, and if by phone, the number) Disposition Plan: home likely AM if stable   Consultants:   Oncology   Procedures:   None   Antimicrobials:   None HPI/Subjective: Alert, no distress, reports dizziness with walking. No acute chest pains   Objective: Vitals:   03/27/16 2152 03/28/16 0623  BP: (!) 143/56 (!) 121/45  Pulse: 80 69  Resp: 18 18  Temp: 97.5 F (36.4 C) 98.4 F (36.9 C)    Intake/Output Summary (Last 24 hours) at 03/28/16 0914 Last data filed at 03/28/16 0700  Gross per 24 hour  Intake              370 ml  Output                0 ml  Net              370 ml   Filed Weights   03/25/16 0123  Weight: 49.5 kg (109 lb 2 oz)    Exam:   General:  No distress   Cardiovascular: s1,s2 rrr  Respiratory: CTA  BL  Abdomen: soft, nt, nd   Musculoskeletal: no leg edema    Data Reviewed: Basic Metabolic Panel:  Recent Labs Lab 03/24/16 2110 03/25/16 0530 03/26/16 0557 03/27/16 0415 03/28/16 0450  NA 131* 131* 133* 133* 132*  K 4.4 4.1 4.5 4.8 4.6  CL 95* 95* 98* 98* 98*  CO2 28 28 29 31 29   GLUCOSE 117* 93 85 101* 101*  BUN 27* 23* 18 16 13   CREATININE 0.76 0.55 0.52 0.66 0.51  CALCIUM 8.5* 8.1* 8.2* 8.4* 8.1*   Liver Function Tests:  Recent Labs Lab 03/25/16 0530  AST 21  ALT 19  ALKPHOS 69  BILITOT 0.5  PROT 5.7*  ALBUMIN 2.8*   No results for input(s): LIPASE, AMYLASE in the last 168 hours. No results for input(s): AMMONIA in the last 168 hours. CBC:  Recent Labs Lab 03/24/16 2110 03/25/16 0530 03/26/16 0557 03/27/16 0415 03/28/16 0450  WBC 2.5* 2.5* 2.3* 3.4* 2.9*  NEUTROABS  --  1.0*  --   --   --   HGB 9.0* 8.2* 8.7* 9.1* 7.8*  HCT 26.6* 24.2* 26.2* 27.5* 23.7*  MCV 105.6* 102.5* 106.5* 108.3* 105.8*  PLT 215 187 217 280 239   Cardiac Enzymes:  Recent Labs Lab 03/25/16 0530  TROPONINI <0.03   BNP (last 3 results)  Recent Labs  03/16/16 0935 03/24/16 2355  BNP 150.0* 196.8*    ProBNP (last 3 results) No results for input(s): PROBNP in the last 8760 hours.  CBG: No results for input(s): GLUCAP in the last 168 hours.  No results found for this or any previous visit (from the past 240 hour(s)).   Studies: No results found.  Scheduled Meds: . apixaban  10 mg Oral BID   Followed by  . [START ON 04/02/2016] apixaban  5 mg Oral BID  . aspirin EC  81 mg Oral Daily  . cholecalciferol  1,000 Units Oral Daily  . dexamethasone  4 mg Oral Daily  . gabapentin  300 mg Oral TID  . irbesartan  150 mg Oral Daily  . levothyroxine  50 mcg Oral QAC breakfast  . mirtazapine  7.5 mg Oral QHS  . morphine  60 mg Oral Q12H  . multivitamin with minerals  1 tablet Oral Daily  . polyvinyl alcohol  2 drop Both Eyes BID  . sodium chloride flush  3 mL  Intravenous Q12H   Continuous Infusions:     Time spent: >35 minutes     Kinnie Feil  [facility] Pager [phone]. If 7PM-7AM, please contact night-coverage at [email or web address], password [email or web address] 03/28/2016, 9:14 AM  LOS: 4 days

## 2016-03-29 DIAGNOSIS — D630 Anemia in neoplastic disease: Secondary | ICD-10-CM

## 2016-03-29 LAB — CBC
HEMATOCRIT: 25 % — AB (ref 36.0–46.0)
HEMOGLOBIN: 8.1 g/dL — AB (ref 12.0–15.0)
MCH: 34.3 pg — AB (ref 26.0–34.0)
MCHC: 32.4 g/dL (ref 30.0–36.0)
MCV: 105.9 fL — AB (ref 78.0–100.0)
Platelets: 273 10*3/uL (ref 150–400)
RBC: 2.36 MIL/uL — AB (ref 3.87–5.11)
RDW: 16.2 % — ABNORMAL HIGH (ref 11.5–15.5)
WBC: 3.5 10*3/uL — ABNORMAL LOW (ref 4.0–10.5)

## 2016-03-29 MED ORDER — APIXABAN 5 MG PO TABS: 5.0000 mg | ORAL_TABLET | Freq: Two times a day (BID) | ORAL | 2 refills | Status: AC

## 2016-03-29 MED ORDER — CYANOCOBALAMIN 1000 MCG PO TABS: 1000.0000 ug | ORAL_TABLET | Freq: Every day | ORAL | 0 refills | Status: AC

## 2016-03-29 MED ORDER — HEPARIN SOD (PORK) LOCK FLUSH 100 UNIT/ML IV SOLN
500.0000 [IU] | INTRAVENOUS | Status: AC | PRN
  Administered 2016-03-29: 500 [IU]

## 2016-03-29 MED ORDER — POLYETHYLENE GLYCOL 3350 17 G PO PACK: 17.0000 g | PACK | Freq: Every day | ORAL | 0 refills | Status: AC | PRN

## 2016-03-29 MED ORDER — FOLIC ACID 1 MG PO TABS: 1.0000 mg | ORAL_TABLET | Freq: Every day | ORAL | 0 refills | Status: AC

## 2016-03-29 MED ORDER — SENNOSIDES-DOCUSATE SODIUM 8.6-50 MG PO TABS: 1.0000 | ORAL_TABLET | Freq: Two times a day (BID) | ORAL | 0 refills | Status: AC

## 2016-03-29 NOTE — Discharge Summary (Addendum)
Physician Discharge Summary  Natasha Jackson QPY:[id number] DOB: 12/03/1925 DOA: 03/24/2016  PCP: Thressa Sheller, MD  Admit date: 03/24/2016 Discharge date: 03/29/2016  Time spent: >45 minutes  Recommendations for Outpatient Follow-up:  PCP in 3-5 days as needed Hematology/oncology as scheduled   Discharge Diagnoses:  Active Problems:   Multiple myeloma in relapse (HCC)   LBBB (left bundle branch block)   Vertebral compression fracture (HCC)   Anemia in neoplastic disease   Quality of life palliative care encounter   Pulmonary embolism (HCC)   Acute respiratory failure with hypoxia (HCC)   Pulmonary embolus ([place])   Discharge Condition: stable   Diet recommendation: low sodium   Filed Weights   03/25/16 0123  Weight: 49.5 kg (109 lb 2 oz)    History of present illness:  82 y.o.woman with a history of MM (active treatment), chronic pancytopenia, HTN, CAD, systolic heart failure, neuropathy, thyroid disease, and remote breast cancer who was in her baseline state of health until almost two weeks ago. She developed intermittent episodes of chest pain and shortness of breath. She presented to [facility] on 9/11 and was admitted for an observation stay. She was thought to have a mild CHF exacerbation, and she was admitted for gentle diuresis. She was discharged in stable condition and was cleared to have her kyphoplasty. The patient tolerated this procedure on Friday. Symptoms returned by Saturday, and have been escalating in intensity and frequency, which prompted presentation to the ED today (even though she was seen in cardiology clinic today as well).   ED Course:CTA of the chest shows bilateral PE, segmental involvement to RLL and subsegmental involvement to LUL. She has been started on heparin infusion per protocol. She is requiring 2L [place] which is new due to O2 sat 84% on RA upon presentation. O2 sat 96% with 2L [place]. She is anxious and rates her pain 7-8 out of 10  currently. Hospitalist asked to admit. Pt admitted for evaluation of dyspnea, noted to have pulmonary embolus, started on heparin drip.  Hospital Course:   Acute hypoxic respiratory failure with hypoxia secondary to PE. Patient is stable, but will require oxygen upon discharge  -patient received iv heparin then transitionedto Eliquis and so far tolerating well. Mild anemia. No obvious s/s of bleeding.   Multiple myeloma in relapse ([place]).  per Dr. Alvy Bimler, pt started on Pomalidomidecomplicated by severe pancytopenia, recommend holding off treatment and due to diagnosis of pulmonary emboli, Dr. Ruffin Pyo does not feel comfortable continuing on treatment.  Chronic back pain greater than 3 months duration. s/p recent kyphoplasty which seems to help. On pain medication regimen, tolerating well, added bowel regimen   Pancytopenia due to antineoplastic chemotherapy ([place]). not symptomatic, no indication for transfusion at this time. Recommended to repeat labs in 5-7 days   Constipation due to opioid therapy, added bowel regimen. abdominal exam is benign. + flatus. + BM  Protein-calorie malnutrition, moderate ([place]). continue low dose dexamethasone per oncology  - still rather poor appetite but pt says this is fist time since admission she actually feels better so she is hopeful to start eating more    Procedures:  CT chest  (i.e. Studies not automatically included, echos, thoracentesis, etc; not x-rays)  Consultations:  Oncology   Discharge Exam: Vitals:   03/28/16 2128 03/29/16 0501  BP: (!) 142/58 (!) 139/54  Pulse: 83 (!) 58  Resp: 18 18  Temp: 97.9 F (36.6 C) 97.4 F (36.3 C)    General: alert, no distress, wants to  go home  Cardiovascular: s1,s2 rrr Respiratory: CTA BL  Discharge Instructions  Discharge Instructions    Diet - low sodium heart healthy    Complete by:  As directed    Discharge instructions    Complete by:  As directed    Please follow up  with primary care doctor in 5-7 days   Increase activity slowly    Complete by:  As directed        Medication List    STOP taking these medications   aspirin 81 MG EC tablet   pomalidomide 2 MG capsule Commonly known as:  POMALYST     TAKE these medications   ALPRAZolam 0.25 MG tablet Commonly known as:  XANAX Take 1 tablet (0.25 mg total) by mouth 3 (three) times daily as needed for anxiety.   apixaban 5 MG Tabs tablet Commonly known as:  ELIQUIS Take 1 tablet (5 mg total) by mouth 2 (two) times daily. apixaban take 10 mg two times daily for 4 days then take apixaban  5 mg twice daily   artificial tears ointment Apply 1 drop to eye as needed (dry eyes).   cholecalciferol 1000 units tablet Commonly known as:  VITAMIN D Take 1,000 Units by mouth daily.   cyanocobalamin 1000 MCG tablet Take 1 tablet (1,000 mcg total) by mouth daily.   dexamethasone 4 MG tablet Commonly known as:  DECADRON Take 4 mg by mouth daily.   folic acid 1 MG tablet Commonly known as:  FOLVITE Take 1 tablet (1 mg total) by mouth daily.   furosemide 20 MG tablet Commonly known as:  LASIX Take 1 tablet daily as needed for swelling What changed:  how much to take  how to take this  when to take this  reasons to take this  additional instructions   gabapentin 300 MG capsule Commonly known as:  NEURONTIN Take 300 mg by mouth 3 (three) times daily.   levothyroxine 50 MCG tablet Commonly known as:  SYNTHROID, LEVOTHROID Take 50 mcg by mouth daily before breakfast.   mirtazapine 7.5 MG tablet Commonly known as:  REMERON Take 7.5 mg by mouth at bedtime.   morphine 60 MG 12 hr tablet Commonly known as:  MS CONTIN Take 1 tablet (60 mg total) by mouth every 12 (twelve) hours.   multivitamin with minerals Tabs tablet Take 1 tablet by mouth daily.   nitroGLYCERIN 0.4 MG SL tablet Commonly known as:  NITROSTAT Place 1 tablet (0.4 mg total) under the tongue every 5 (five) minutes as  needed for chest pain.   oxycodone 30 MG immediate release tablet Commonly known as:  ROXICODONE Take 1 tablet (30 mg total) by mouth every 4 (four) hours as needed for severe pain.   polyethylene glycol packet Commonly known as:  MIRALAX / GLYCOLAX Take 17 g by mouth daily as needed for mild constipation.   REFRESH OP Place 2 drops into both eyes 2 (two) times daily as needed (dry eyes).   senna-docusate 8.6-50 MG tablet Commonly known as:  Senokot-S Take 1 tablet by mouth 2 (two) times daily.   valsartan 160 MG tablet Commonly known as:  DIOVAN Take 1 tablet (160 mg total) by mouth daily.      Allergies  Allergen Reactions  . No Known Allergies    Follow-up [place] [facility] .   Why:  Civil engineer, contracting information: [address] [place] [address] [phone]  [place] .   Why:  [place], physical therapy, occupational therapy Contact information: [address] [place] [address] [phone]            The results of significant diagnostics from this hospitalization (including imaging, microbiology, ancillary and laboratory) are listed below for reference.    Significant Diagnostic Studies: Dg Chest 2 View  Result Date: 03/24/2016 CLINICAL DATA:  Shortness of breath and back pain since this morning. Previous hospitalization 1 week ago with a cardiac event. EXAM: CHEST  2 VIEW COMPARISON:  03/16/2016 FINDINGS: Cardiac enlargement. Pulmonary vascularity is decreasing since previous study. No focal consolidation or airspace disease. Slight blunting of costophrenic angles may indicate small pleural effusions. Fibrosis in the lung apices. Apical pleural thickening bilaterally. Tortuous common dilated, and calcified aorta. Power port type central venous catheter with tip over right atrium. Multiple thoracic vertebral compression deformities post kyphoplasty. IMPRESSION: Cardiac enlargement.  Small bilateral pleural effusions. No significant vascular congestion or edema. Electronically Signed   By: Lucienne Capers M.D.   On: 03/24/2016 21:15   Ct Angio Chest Pe W And/or Wo Contrast  Result Date: 03/24/2016 CLINICAL DATA:  Acute onset of shortness of breath. Recent back surgery. Initial encounter. EXAM: CT ANGIOGRAPHY CHEST WITH CONTRAST TECHNIQUE: Multidetector CT imaging of the chest was performed using the standard protocol during bolus administration of intravenous contrast. Multiplanar CT image reconstructions and MIPs were obtained to evaluate the vascular anatomy. CONTRAST:  71.4 mL of Isovue 370 IV contrast COMPARISON:  Chest radiograph performed earlier today at 8:57 p.m., and CTA of the chest performed 08/03/2014 FINDINGS: Cardiovascular: Pulmonary embolus is noted within segmental branches to the right lower lobe, and within subsegmental branches to the left upper lobe. The RV/LV ratio is borderline normal, though this is difficult to assess given the patient's pectus excavatum. Diffuse coronary artery calcifications are seen. Mild biatrial enlargement is suggested. Mild calcification is noted along the aortic arch and descending thoracic aorta. The great vessels are grossly unremarkable in appearance. Mediastinum/Nodes: The patient's right-sided chest port is noted ending about the right atrium. No mediastinal lymphadenopathy is seen. No significant pericardial effusion is identified. The thyroid gland is diminutive, with a prominent right-sided calcification. No axillary lymphadenopathy is seen. Lungs/Pleura: Small right and trace left pleural effusions are noted, with bibasilar atelectasis or scarring. Underlying interstitial prominence raises concern for mild interstitial edema. No pneumothorax is seen. No masses are identified. Scarring is noted at the lung apices, with associated calcification. Upper Abdomen: The visualized portions of the liver and spleen are grossly unremarkable.  Reflux of contrast into the IVC is incidentally seen. Musculoskeletal: No acute osseous abnormalities are identified. There is mild chronic compression deformity of vertebral body T6, and changes of vertebroplasty are noted at T9 through T12. The visualized musculature is unremarkable in appearance. Review of the MIP images confirms the above findings. IMPRESSION: 1. Pulmonary embolus within segmental branches to the right lower lung lobe, and within subsegmental branches to the left upper lobe. 2. Small right and trace left pleural effusions, with underlying interstitial prominence, concerning for mild interstitial edema. 3. Diffuse coronary artery calcifications seen. Mild biatrial enlargement noted. 4. Scarring at the lung apices, with associated calcification. 5. Mild chronic compression deformity of vertebral body T6, and changes of vertebroplasty at T9 through T12. Critical Value/emergent results were called by telephone at the time of interpretation on 03/24/2016 at 10:59 pm to Dr. Shary Decamp, who verbally acknowledged these results. Electronically Signed  By: Garald Balding M.D.   On: 03/24/2016 23:00   Dg Chest Portable 1 View  Result Date: 03/16/2016 CLINICAL DATA:  Chest pain since last night.  Bone cancer on EXAM: PORTABLE CHEST 1 VIEW COMPARISON:  12/16/2015 metastatic survey FINDINGS: Cardiopericardial enlargement and vascular pedicle widening. Hazy appearance at the bases may reflect small effusions. Pulmonary venous congestion. No convincing pneumonia. Porta catheter on the right with tip at the right atrium. IMPRESSION: Cardiomegaly and pulmonary venous congestion. Electronically Signed   By: Monte Fantasia M.D.   On: 03/16/2016 10:42   Ir Vertebroplasty Cerv/thor Bx Inc Uni/bil Inc/inject/imaging  Result Date: 03/23/2016 INDICATION: Severe thoracic pain secondary to compression fractures at T10 and T11. EXAM: IR VERTEBROPLASTY CERVICOTHORACIC INJ; IR VERTEBROPLASTY ADDL INJECTION  MEDICATIONS: As antibiotic prophylaxis, 2 g Ancef IV was ordered pre-procedure and administered intravenously within 1 hour of incision. ANESTHESIA/SEDATION: Moderate (conscious) sedation was employed during this procedure. A total of Versed 2 mg and Fentanyl 75 mcg was administered intravenously. Moderate Sedation Time: 25 minutes. The patient's level of consciousness and vital signs were monitored continuously by radiology nursing throughout the procedure under my direct supervision. FLUOROSCOPY TIME:  Fluoroscopy Time: 30 minutes 30 seconds (053 mGy) COMPLICATIONS: None immediate. TECHNIQUE: Informed written consent was obtained from the patient after a thorough discussion of the procedural risks, benefits and alternatives. All questions were addressed. Maximal Sterile Barrier Technique was utilized including caps, mask, sterile gowns, sterile gloves, sterile drape, hand hygiene and skin antiseptic. A timeout was performed prior to the initiation of the procedure. PROCEDURE: The patient was placed prone on the fluoroscopic table. Nasal oxygen was administered. Physiologic monitoring was performed throughout the duration of the procedure. The skin overlying the region was prepped and draped in the usual sterile fashion. The T10 and T11 vertebral bodies were identified and the right pedicle at T11, and left pedicle at T10 were then infiltrated with 0.25% bupivacaine. This was then followed by the advancement of a 13-gauge Cook needles through the respective pediclesinto the anterior one-third at T10 and T11. A gentle contrast injection demonstrated a trabecular pattern of contrast with early opacification of paraspinous venous structures. This necessitated the use of Gel-Foam pledgets to embolize the venous channels prior to the delivery of the methylmethacrylate mixture at both levels. At this time, methylmethacrylate mixture was reconstituted. Under biplane intermittent fluoroscopy, the methylmethacrylate was  then injected into the T10 and T11 vertebral bodies with filling of the vertebral bodies. No extravasation was noted into the disk spaces or posteriorly into the spinal canal. No epidural venous contamination was seen. The needles were then removed. Hemostasis was achieved at the skin entry sites. There were no acute complications. Patient tolerated the procedure well. The patient was observed for 3 hours and discharged in good condition under the care of her daughter. IMPRESSION: 1. Status post vertebral body augmentation for painful compression fractures at T10 and T11 using vertebroplasty technique. Electronically Signed   By: Luanne Bras M.D.   On: 03/20/2016 12:54   Ir Vertebroplasty Ea Addl (t&ls) Bx Inc Uni/bil Inc Inject/imaging  Result Date: 03/23/2016 INDICATION: Severe thoracic pain secondary to compression fractures at T10 and T11. EXAM: IR VERTEBROPLASTY CERVICOTHORACIC INJ; IR VERTEBROPLASTY ADDL INJECTION MEDICATIONS: As antibiotic prophylaxis, 2 g Ancef IV was ordered pre-procedure and administered intravenously within 1 hour of incision. ANESTHESIA/SEDATION: Moderate (conscious) sedation was employed during this procedure. A total of Versed 2 mg and Fentanyl 75 mcg was administered intravenously. Moderate Sedation Time: 25 minutes. The  patient's level of consciousness and vital signs were monitored continuously by radiology nursing throughout the procedure under my direct supervision. FLUOROSCOPY TIME:  Fluoroscopy Time: 30 minutes 30 seconds (748 mGy) COMPLICATIONS: None immediate. TECHNIQUE: Informed written consent was obtained from the patient after a thorough discussion of the procedural risks, benefits and alternatives. All questions were addressed. Maximal Sterile Barrier Technique was utilized including caps, mask, sterile gowns, sterile gloves, sterile drape, hand hygiene and skin antiseptic. A timeout was performed prior to the initiation of the procedure. PROCEDURE: The patient  was placed prone on the fluoroscopic table. Nasal oxygen was administered. Physiologic monitoring was performed throughout the duration of the procedure. The skin overlying the region was prepped and draped in the usual sterile fashion. The T10 and T11 vertebral bodies were identified and the right pedicle at T11, and left pedicle at T10 were then infiltrated with 0.25% bupivacaine. This was then followed by the advancement of a 13-gauge Cook needles through the respective pediclesinto the anterior one-third at T10 and T11. A gentle contrast injection demonstrated a trabecular pattern of contrast with early opacification of paraspinous venous structures. This necessitated the use of Gel-Foam pledgets to embolize the venous channels prior to the delivery of the methylmethacrylate mixture at both levels. At this time, methylmethacrylate mixture was reconstituted. Under biplane intermittent fluoroscopy, the methylmethacrylate was then injected into the T10 and T11 vertebral bodies with filling of the vertebral bodies. No extravasation was noted into the disk spaces or posteriorly into the spinal canal. No epidural venous contamination was seen. The needles were then removed. Hemostasis was achieved at the skin entry sites. There were no acute complications. Patient tolerated the procedure well. The patient was observed for 3 hours and discharged in good condition under the care of her daughter. IMPRESSION: 1. Status post vertebral body augmentation for painful compression fractures at T10 and T11 using vertebroplasty technique. Electronically Signed   By: Luanne Bras M.D.   On: 03/20/2016 12:54    Microbiology: No results found for this or any previous visit (from the past 240 hour(s)).   Labs: Basic Metabolic Panel:  Recent Labs Lab 03/24/16 2110 03/25/16 0530 03/26/16 0557 03/27/16 0415 03/28/16 0450  NA 131* 131* 133* 133* 132*  K 4.4 4.1 4.5 4.8 4.6  CL 95* 95* 98* 98* 98*  CO2 28 28 29 31  29   GLUCOSE 117* 93 85 101* 101*  BUN 27* 23* 18 16 13   CREATININE 0.76 0.55 0.52 0.66 0.51  CALCIUM 8.5* 8.1* 8.2* 8.4* 8.1*   Liver Function Tests:  Recent Labs Lab 03/25/16 0530  AST 21  ALT 19  ALKPHOS 69  BILITOT 0.5  PROT 5.7*  ALBUMIN 2.8*   No results for input(s): LIPASE, AMYLASE in the last 168 hours. No results for input(s): AMMONIA in the last 168 hours. CBC:  Recent Labs Lab 03/25/16 0530 03/26/16 0557 03/27/16 0415 03/28/16 0450 03/29/16 0545  WBC 2.5* 2.3* 3.4* 2.9* 3.5*  NEUTROABS 1.0*  --   --   --   --   HGB 8.2* 8.7* 9.1* 7.8* 8.1*  HCT 24.2* 26.2* 27.5* 23.7* 25.0*  MCV 102.5* 106.5* 108.3* 105.8* 105.9*  PLT 187 217 280 239 273   Cardiac Enzymes:  Recent Labs Lab 03/25/16 0530  TROPONINI <0.03   BNP: BNP (last 3 results)  Recent Labs  03/16/16 0935 03/24/16 2355  BNP 150.0* 196.8*    ProBNP (last 3 results) No results for input(s): PROBNP in the last 8760 hours.  CBG: No results for input(s): GLUCAP in the last 168 hours.     SignedKinnie Feil  [facility] 03/29/2016, 8:40 AM

## 2016-03-29 NOTE — Progress Notes (Signed)
SATURATION QUALIFICATIONS: (This note is used to comply with regulatory documentation for home oxygen)  Patient Saturations on Room Air at Rest = 93  Patient Saturations on Room Air while Ambulating = 79  Patient Saturations on 2 Liters of oxygen while Ambulating = 95  Please briefly explain why patient needs home oxygen:

## 2016-03-29 NOTE — Care Management (Addendum)
CM spoke with patient. Patient confirms she has oxygen in the room for transport home at discharge. Maui Britten Film/video editor BSN CCM  10:50 CM spoke with [place] at [facility]. Patient's oxygen will be set up at home today. [facility] is aware of discharge for today. Presenter, broadcasting BSN CCM

## 2016-03-30 ENCOUNTER — Telehealth: Payer: Self-pay | Admitting: Cardiovascular Disease

## 2016-03-30 ENCOUNTER — Telehealth: Payer: Self-pay | Admitting: *Deleted

## 2016-03-30 ENCOUNTER — Telehealth: Payer: Self-pay | Admitting: Hematology and Oncology

## 2016-03-30 NOTE — Telephone Encounter (Signed)
Yes, 4 mg daily for appetite and help with bone pain. It's part of myeloma Rx

## 2016-03-30 NOTE — Telephone Encounter (Signed)
S/w Daugther, Tye [place], and informed Dr. Alvy Bimler recommends pt stay on Dexamethasone 4 mg daily to help w/ appetite and bone pain.  She verbalized understanding.  She is unsure if pt can make her appt to see Dr. Alvy Bimler tomorrow d/t weakness, but they will wait to see how she is feeling tomorrow before they cancel.

## 2016-03-30 NOTE — Telephone Encounter (Signed)
Daughter LVM asks if pt is supposed to continue taking dexamethasone at home?

## 2016-03-30 NOTE — Telephone Encounter (Signed)
°  FYI  Calling to notify Dr. Johnsie Cancel that pt is now out of the hospital.

## 2016-03-30 NOTE — Telephone Encounter (Signed)
Spoke with patient confirming 9/26 and 9/29

## 2016-03-31 ENCOUNTER — Telehealth: Payer: Self-pay | Admitting: *Deleted

## 2016-03-31 ENCOUNTER — Ambulatory Visit (HOSPITAL_BASED_OUTPATIENT_CLINIC_OR_DEPARTMENT_OTHER): Payer: Medicare Other | Admitting: Hematology and Oncology

## 2016-03-31 ENCOUNTER — Other Ambulatory Visit (HOSPITAL_BASED_OUTPATIENT_CLINIC_OR_DEPARTMENT_OTHER): Payer: Medicare Other

## 2016-03-31 ENCOUNTER — Encounter: Payer: Self-pay | Admitting: Hematology and Oncology

## 2016-03-31 VITALS — BP 127/79 | HR 81 | Temp 98.8°F | Resp 18

## 2016-03-31 DIAGNOSIS — I2699 Other pulmonary embolism without acute cor pulmonale: Secondary | ICD-10-CM

## 2016-03-31 DIAGNOSIS — F411 Generalized anxiety disorder: Secondary | ICD-10-CM

## 2016-03-31 DIAGNOSIS — F329 Major depressive disorder, single episode, unspecified: Secondary | ICD-10-CM | POA: Diagnosis not present

## 2016-03-31 DIAGNOSIS — F32A Depression, unspecified: Secondary | ICD-10-CM

## 2016-03-31 DIAGNOSIS — F5102 Adjustment insomnia: Secondary | ICD-10-CM

## 2016-03-31 DIAGNOSIS — G8929 Other chronic pain: Secondary | ICD-10-CM

## 2016-03-31 DIAGNOSIS — C9001 Multiple myeloma in remission: Secondary | ICD-10-CM

## 2016-03-31 DIAGNOSIS — F410 Panic disorder [episodic paroxysmal anxiety] without agoraphobia: Secondary | ICD-10-CM

## 2016-03-31 DIAGNOSIS — M549 Dorsalgia, unspecified: Secondary | ICD-10-CM | POA: Diagnosis not present

## 2016-03-31 DIAGNOSIS — D630 Anemia in neoplastic disease: Secondary | ICD-10-CM | POA: Diagnosis not present

## 2016-03-31 DIAGNOSIS — C9002 Multiple myeloma in relapse: Secondary | ICD-10-CM

## 2016-03-31 DIAGNOSIS — Z7189 Other specified counseling: Secondary | ICD-10-CM | POA: Insufficient documentation

## 2016-03-31 LAB — CBC WITH DIFFERENTIAL/PLATELET
BASO%: 1 % (ref 0.0–2.0)
Basophils Absolute: 0.1 10*3/uL (ref 0.0–0.1)
EOS ABS: 0 10*3/uL (ref 0.0–0.5)
EOS%: 0.5 % (ref 0.0–7.0)
HCT: 28.4 % — ABNORMAL LOW (ref 34.8–46.6)
HEMOGLOBIN: 9.5 g/dL — AB (ref 11.6–15.9)
LYMPH%: 6.5 % — AB (ref 14.0–49.7)
MCH: 36 pg — ABNORMAL HIGH (ref 25.1–34.0)
MCHC: 33.5 g/dL (ref 31.5–36.0)
MCV: 107.4 fL — ABNORMAL HIGH (ref 79.5–101.0)
MONO#: 0.1 10*3/uL (ref 0.1–0.9)
MONO%: 2.1 % (ref 0.0–14.0)
NEUT%: 89.9 % — ABNORMAL HIGH (ref 38.4–76.8)
NEUTROS ABS: 5.6 10*3/uL (ref 1.5–6.5)
Platelets: 334 10*3/uL (ref 145–400)
RBC: 2.65 10*6/uL — AB (ref 3.70–5.45)
RDW: 16.9 % — AB (ref 11.2–14.5)
WBC: 6.2 10*3/uL (ref 3.9–10.3)
lymph#: 0.4 10*3/uL — ABNORMAL LOW (ref 0.9–3.3)

## 2016-03-31 LAB — COMPREHENSIVE METABOLIC PANEL
ALBUMIN: 2.8 g/dL — AB (ref 3.5–5.0)
ALK PHOS: 94 U/L (ref 40–150)
ALT: 26 U/L (ref 0–55)
AST: 26 U/L (ref 5–34)
Anion Gap: 10 mEq/L (ref 3–11)
BILIRUBIN TOTAL: 0.46 mg/dL (ref 0.20–1.20)
BUN: 21.1 mg/dL (ref 7.0–26.0)
CO2: 25 meq/L (ref 22–29)
CREATININE: 0.7 mg/dL (ref 0.6–1.1)
Calcium: 8.8 mg/dL (ref 8.4–10.4)
Chloride: 97 mEq/L — ABNORMAL LOW (ref 98–109)
EGFR: 76 mL/min/{1.73_m2} — ABNORMAL LOW (ref >90)
GLUCOSE: 120 mg/dL (ref 70–140)
Potassium: 4.5 mEq/L (ref 3.5–5.1)
SODIUM: 132 meq/L — AB (ref 136–145)
TOTAL PROTEIN: 6.9 g/dL (ref 6.4–8.3)

## 2016-03-31 MED ORDER — TEMAZEPAM 15 MG PO CAPS: 15.0000 mg | ORAL_CAPSULE | Freq: Every evening | ORAL | 0 refills | Status: AC | PRN

## 2016-03-31 MED ORDER — SODIUM CHLORIDE 0.9 % IJ SOLN
10.0000 mL | INTRAMUSCULAR | Status: DC | PRN
  Filled 2016-03-31: qty 10

## 2016-03-31 MED ORDER — OXYCODONE HCL 30 MG PO TABS: 30.0000 mg | ORAL_TABLET | ORAL | 0 refills | Status: DC | PRN

## 2016-03-31 MED ORDER — HEPARIN SOD (PORK) LOCK FLUSH 100 UNIT/ML IV SOLN
500.0000 [IU] | INTRAVENOUS | Status: AC | PRN
  Administered 2016-03-31: 500 [IU]
  Filled 2016-03-31: qty 5

## 2016-03-31 NOTE — Assessment & Plan Note (Signed)
She has multifactorial chronic back pain. With recent change in her prescription pain medication, with MS Contin to 60 mg twice a day and oxycodone immediate release breakthrough pain medicine to 30 mg every 4 hours as needed for breakthrough pain, her pain is under good control She had recent kyphoplasty She is on calcium and vitamin D I refill her prescription pain medicine today. We will continue to same dose and I will reassess again in 3 days.

## 2016-03-31 NOTE — Assessment & Plan Note (Signed)
She has significant depression. She does not tolerate Mirtazapine and that seems to have exacerbated panic attacks. We discussed different strategies to help relieve her significant anxiety I will address her depression again in the next visit

## 2016-03-31 NOTE — Assessment & Plan Note (Signed)
We have discussed goals of care before. The patient is very frail with poor tolerance to treatment. I have attempted to discuss possibility of discontinuation of chemotherapy in the past but the patient wants to continue treatment. She wants to stay alive to take care of her husband who is 80 years old and frail. I will attempt to readdress goals of care in her next visit

## 2016-03-31 NOTE — Progress Notes (Signed)
[place] OFFICE PROGRESS NOTE  Patient Care Team: Thressa Sheller, MD as PCP - General (Internal Medicine) Thressa Sheller, MD (Internal Medicine) Renella Cunas, MD (Inactive) as Attending Physician (Cardiology) Heath Lark, MD as Consulting Physician (Hematology and Oncology) Luanne Bras, MD as Consulting Physician (Interventional Radiology)  SUMMARY OF ONCOLOGIC HISTORY: Oncology History   The     Multiple myeloma in relapse [facility])   11/12/2008 Imaging    Skeletal survey showed lytic lesion in the right femur compatible with  myeloma. There were Questionable skull lesions      11/15/2008 Bone Marrow Biopsy    BONE MARROW ASPIRATE AND BIOPSY: showed NORMOCELLULAR MARROW FOR AGE WITH PLASMA CELL DYSCRASIA  (PLASMA CELLS 25%). 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 Subsequently she was placed on Revlimid & Dexamethasone alone      05/12/2013 Bone Marrow Biopsy    Bone marrow biopsy showed persistent plasma cells. Blood work confirmed VGPR status      06/11/2014 Tumor Marker    Bloodwork show disease relapse      06/26/2014 Procedure    She has placement of port      07/05/2014 - 07/26/2014 Chemotherapy    She received Elotuzumab and Revlimid. Rx was discontinued with elotuzumab per patient preference      08/01/2014 - 08/05/2014 Hospital Admission    She was admitted to the hospital for kyphoplasty and pain management after traumatic compression fracture      08/23/2014 - 04/18/2015 Chemotherapy    She was placed on dexamethasone & Revlimid      04/11/2015 Tumor Marker    Blood work showed VGPR. Treatment was discontinued and she is maintained on Zometa only      10/20/2015 Imaging    MRI spine showed acute/subacute superior endplate compression fractures at T2 and T9 are incompletely healed.        11/05/2015 Procedure    Status post vertebral body augmentation using balloon kyphoplasty at T9 as described without event      12/27/2015 Procedure    She had port placement      01/02/2016 -  Chemotherapy    She received Daratumumab, Dexamethasone and Velcade. Velcade was discontinued after 01/30/16 after less than optimum response is noted and her Rx is switched to Daratumumab, dexamethasone and Pomalidomide      01/20/2016 Imaging    MRi spine showed acute to subacute pathologic fractures of T10 and T11. Up to 70% loss of vertebral body height at the former with mild retropulsion of bone contributing to mild spinal stenosis at the T10-T11 level.      03/16/2016 - 03/17/2016 Hospital Admission    She was admitted to the hospital because of shortness of breath      03/20/2016 Procedure    Status post vertebral body augmentation for painful compression fractures at T10 and T11 using vertebroplasty technique.       03/24/2016 Imaging    CT angiogram showed pulmonary embolus within segmental branches to the right lower lung lobe, and within subsegmental branches to the left upper lobe. Small right and trace left pleural effusions, with underlying interstitial prominence, concerning for mild interstitial edema. She and and atenolol was sitting at the [facility] this is okay to stop diffuse coronary artery calcifications seen. Mild biatrial enlargement noted. Scarring at the lung apices, with associated calcification.  03/24/2016 - 03/29/2016 Hospital Admission    The patient was admitted due to shortness of breath and was found to have pulmonary emboli       INTERVAL HISTORY: Please see below for problem oriented charting. She is here today with her daughter. She have recurrent panic attacks since recent discharge from the hospital. She has been taking Xanax on a regular basis. Her back pain is stable. She is very shaky, tearful and had problems with sleeping. She is concerned  about taking Remeron because of fear for side effects. She does not want to use oxygen because she thinks it is a hazard at home, and the oxygen tubing could cause risk of falls at home. The patient denies any recent signs or symptoms of bleeding such as spontaneous epistaxis, hematuria or hematochezia. She admits she is depressed but not suicidal. She denies chest pain or worsening shortness of breath.  REVIEW OF SYSTEMS:   Constitutional: Denies fevers, chills or abnormal weight loss Eyes: Denies blurriness of vision Ears, nose, mouth, throat, and face: Denies mucositis or sore throat Respiratory: Denies cough, dyspnea or wheezes Cardiovascular: Denies palpitation, chest discomfort or lower extremity swelling Gastrointestinal:  Denies nausea, heartburn or change in bowel habits Skin: Denies abnormal skin rashes Lymphatics: Denies new lymphadenopathy or easy bruising Neurological:Denies numbness, tingling or new weaknesses All other systems were reviewed with the patient and are negative.  I have reviewed the past medical history, past surgical history, social history and family history with the patient and they are unchanged from previous note.  ALLERGIES:  is allergic to no known allergies.  MEDICATIONS:  Current Outpatient Prescriptions  Medication Sig Dispense Refill  . ALPRAZolam (XANAX) 0.25 MG tablet Take 1 tablet (0.25 mg total) by mouth 3 (three) times daily as needed for anxiety. 30 tablet 0  . apixaban (ELIQUIS) 5 MG TABS tablet Take 1 tablet (5 mg total) by mouth 2 (two) times daily. apixaban take 10 mg two times daily for 4 days then take apixaban  5 mg twice daily 60 tablet 2  . Artificial Tear Ointment (ARTIFICIAL TEARS) ointment Apply 1 drop to eye as needed (dry eyes).     . cholecalciferol (VITAMIN D) 1000 UNITS tablet Take 1,000 Units by mouth daily.    [name] dexamethasone (DECADRON) 4 MG tablet Take 4 mg by mouth daily.     . folic acid (FOLVITE) 1 MG tablet Take 1  tablet (1 mg total) by mouth daily. 30 tablet 0  . furosemide (LASIX) 20 MG tablet Take 1 tablet daily as needed for swelling (Patient taking differently: Take 20 mg by mouth daily as needed for fluid. Take 1 tablet daily as needed for swelling) 30 tablet 3  . gabapentin (NEURONTIN) 300 MG capsule Take 300 mg by mouth 3 (three) times daily.    [name] levothyroxine (SYNTHROID, LEVOTHROID) 50 MCG tablet Take 50 mcg by mouth daily before breakfast.    . mirtazapine (REMERON) 7.5 MG tablet Take 7.5 mg by mouth at bedtime.    [name] morphine (MS CONTIN) 60 MG 12 hr tablet Take 1 tablet (60 mg total) by mouth every 12 (twelve) hours. 60 tablet 0  . Multiple Vitamin (MULTIVITAMIN WITH MINERALS) TABS tablet Take 1 tablet by mouth daily.    . nitroGLYCERIN (NITROSTAT) 0.4 MG SL tablet Place 1 tablet (0.4 mg total) under the tongue every 5 (five) minutes as needed for chest pain. 25 tablet 11  . oxycodone (ROXICODONE) 30 MG immediate release tablet Take 1 tablet (30  mg total) by mouth every 4 (four) hours as needed. 90 tablet 0  . polyethylene glycol (MIRALAX / GLYCOLAX) packet Take 17 g by mouth daily as needed for mild constipation. 14 each 0  . Polyvinyl Alcohol-Povidone (REFRESH OP) Place 2 drops into both eyes 2 (two) times daily as needed (dry eyes).    [name] senna-docusate (SENOKOT-S) 8.6-50 MG tablet Take 1 tablet by mouth 2 (two) times daily. 30 tablet 0  . valsartan (DIOVAN) 160 MG tablet Take 1 tablet (160 mg total) by mouth daily. 30 tablet 3  . vitamin B-12 1000 MCG tablet Take 1 tablet (1,000 mcg total) by mouth daily. 30 tablet 0  . temazepam (RESTORIL) 15 MG capsule Take 1 capsule (15 mg total) by mouth at bedtime as needed for sleep. 30 capsule 0   Current Facility-Administered Medications  Medication Dose Route Frequency Provider Last Rate Last Dose  . sodium chloride 0.9 % injection 10 mL  10 mL Intracatheter PRN Heath Lark, MD        PHYSICAL EXAMINATION: ECOG PERFORMANCE STATUS: 1 - Symptomatic  but completely ambulatory  Vitals:   03/31/16 1058  BP: 127/79  Pulse: 81  Resp: 18  Temp: 98.8 F (37.1 C)   There were no vitals filed for this visit.  GENERAL:alert, no distress and comfortable. She Looks thin, very anxious and jittery SKIN: skin color, texture, turgor are normal, no rashes or significant lesions EYES: normal, Conjunctiva are pink and non-injected, sclera clear Musculoskeletal:no cyanosis of digits and no clubbing  NEURO: alert & oriented x 3 with fluent speech, no focal motor/sensory deficits  LABORATORY DATA:  I have reviewed the data as listed    Component Value Date/Time   NA 132 (L) 03/31/2016 1032   K 4.5 03/31/2016 1032   CL 98 (L) 03/28/2016 0450   CL 103 11/25/2012 1328   CO2 25 03/31/2016 1032   GLUCOSE 120 03/31/2016 1032   GLUCOSE 91 11/25/2012 1328   BUN 21.1 03/31/2016 1032   CREATININE 0.7 03/31/2016 1032   CALCIUM 8.8 03/31/2016 1032   PROT 6.9 03/31/2016 1032   ALBUMIN 2.8 (L) 03/31/2016 1032   AST 26 03/31/2016 1032   ALT 26 03/31/2016 1032   ALKPHOS 94 03/31/2016 1032   BILITOT 0.46 03/31/2016 1032   GFRNONAA >60 03/28/2016 0450   GFRAA >60 03/28/2016 0450    No results found for: SPEP, UPEP  Lab Results  Component Value Date   WBC 6.2 03/31/2016   NEUTROABS 5.6 03/31/2016   HGB 9.5 (L) 03/31/2016   HCT 28.4 (L) 03/31/2016   MCV 107.4 (H) 03/31/2016   PLT 334 03/31/2016      Chemistry      Component Value Date/Time   NA 132 (L) 03/31/2016 1032   K 4.5 03/31/2016 1032   CL 98 (L) 03/28/2016 0450   CL 103 11/25/2012 1328   CO2 25 03/31/2016 1032   BUN 21.1 03/31/2016 1032   CREATININE 0.7 03/31/2016 1032      Component Value Date/Time   CALCIUM 8.8 03/31/2016 1032   ALKPHOS 94 03/31/2016 1032   AST 26 03/31/2016 1032   ALT 26 03/31/2016 1032   BILITOT 0.46 03/31/2016 1032     ASSESSMENT & PLAN:  Multiple myeloma in relapse ([place]) The patient tolerated treatment very poorly. Today, she appears extremely  anxious, jittery with significant panic attacks. Her pancytopenia has improved since discontinuation of Pomalidomide. She is scheduled to received Daratumumab at the end of the week. I will  see her again in 3 days to address all her other multiple concerns  Anemia in neoplastic disease This is likely anemia of chronic disease. The patient denies recent history of bleeding such as epistaxis, hematuria or hematochezia. She is asymptomatic from the anemia. We will observe for now.  She does not require transfusion now.    Chronic back pain greater than 3 months duration She has multifactorial chronic back pain. With recent change in her prescription pain medication, with MS Contin to 60 mg twice a day and oxycodone immediate release breakthrough pain medicine to 30 mg every 4 hours as needed for breakthrough pain, her pain is under good control She had recent kyphoplasty She is on calcium and vitamin D I refill her prescription pain medicine today. We will continue to same dose and I will reassess again in 3 days.  Depression She has significant depression. She does not tolerate Mirtazapine and that seems to have exacerbated panic attacks. We discussed different strategies to help relieve her significant anxiety I will address her depression again in the next visit  Insomnia due to stress We discussed different medication for this. I am concerned about increased risk of fall but the patient is reluctant to try other things. I will start her on temazepam but did warn her and family members about increased risk of fall and sedation  Anxiety attack She has significant recurrent panic attacks. She is very jittery my office and shaky. We discussed strategy by reducing the dose of dexamethasone to 2 mg daily and continue Xanax as needed. I told her she does not need oxygen therapy. She seems to be very concerned that the oxygen tank might cost injuries at home  Pulmonary embolism (HCC) Her  oxygenation is good. She does not need oxygen. She will continue anticoagulation therapy indefinitely. I have discontinued Pomalidomide  Goals of care, counseling/discussion We have discussed goals of care before. The patient is very frail with poor tolerance to treatment. I have attempted to discuss possibility of discontinuation of chemotherapy in the past but the patient wants to continue treatment. She wants to stay alive to take care of her husband who is 15 years old and frail. I will attempt to readdress goals of care in her next visit   No orders of the defined types were placed in this encounter.  All questions were answered. The patient knows to call the clinic with any problems, questions or concerns. No barriers to learning was detected. I spent 40 minutes counseling the patient face to face. The total time spent in the appointment was 55 minutes and more than 50% was on counseling and review of test results     Heath Lark, MD 03/31/2016 1:07 PM

## 2016-03-31 NOTE — Assessment & Plan Note (Signed)
She has significant recurrent panic attacks. She is very jittery my office and shaky. We discussed strategy by reducing the dose of dexamethasone to 2 mg daily and continue Xanax as needed. I told her she does not need oxygen therapy. She seems to be very concerned that the oxygen tank might cost injuries at home

## 2016-03-31 NOTE — Assessment & Plan Note (Signed)
The patient tolerated treatment very poorly. Today, she appears extremely anxious, jittery with significant panic attacks. Her pancytopenia has improved since discontinuation of Pomalidomide. She is scheduled to received Daratumumab at the end of the week. I will see her again in 3 days to address all her other multiple concerns

## 2016-03-31 NOTE — Assessment & Plan Note (Signed)
Her oxygenation is good. She does not need oxygen. She will continue anticoagulation therapy indefinitely. I have discontinued Pomalidomide

## 2016-03-31 NOTE — Assessment & Plan Note (Signed)
We discussed different medication for this. I am concerned about increased risk of fall but the patient is reluctant to try other things. I will start her on temazepam but did warn her and family members about increased risk of fall and sedation

## 2016-03-31 NOTE — Assessment & Plan Note (Signed)
This is likely anemia of chronic disease. The patient denies recent history of bleeding such as epistaxis, hematuria or hematochezia. She is asymptomatic from the anemia. We will observe for now.  She does not require transfusion now.   

## 2016-03-31 NOTE — Telephone Encounter (Signed)
April left VM asking if pt can increase Xanax to four times a day to include a bedtime dose? Says she was instructed by Dr. Alvy Bimler to take 3 times a day w/ breakfast, lunch and dinner, but pt needs a dose at bedtime to help her sleep.  If she can't take it QID, should she then take only 1/2 tab at dinner then the other 1/2 at bedtime?

## 2016-04-01 ENCOUNTER — Telehealth: Payer: Self-pay | Admitting: *Deleted

## 2016-04-01 LAB — KAPPA/LAMBDA LIGHT CHAINS
IG KAPPA FREE LIGHT CHAIN: 136.1 mg/L — AB (ref 3.3–19.4)
Ig Lambda Free Light Chain: 12.7 mg/L (ref 5.7–26.3)
Kappa/Lambda FluidC Ratio: 10.72 — ABNORMAL HIGH (ref 0.26–1.65)

## 2016-04-01 NOTE — Telephone Encounter (Signed)
VM from [place], Therapist, sports at [facility].  She states pt does not want oxygen in home and that Dr. Alvy Bimler has d/c'd the oxygen order.  Pt also declined Nurse visits.  Therefore pt is d/c'd from [place] services at this time.

## 2016-04-01 NOTE — Telephone Encounter (Signed)
Informed pt's daughter April for pt to take Xanax TID with meals as directed and then can take restoril at bedtime for sleep.  Per Dr. Alvy Bimler.  She verbalized understanding.

## 2016-04-01 NOTE — Telephone Encounter (Signed)
I have already prescribed temazepam at night for sleep. That is a long acting benzo. So, no, keep xanax at TID for now

## 2016-04-02 LAB — MULTIPLE MYELOMA PANEL, SERUM
ALBUMIN SERPL ELPH-MCNC: 3.3 g/dL (ref 2.9–4.4)
ALBUMIN/GLOB SERPL: 1.2 (ref 0.7–1.7)
ALPHA 1: 0.2 g/dL (ref 0.0–0.4)
ALPHA2 GLOB SERPL ELPH-MCNC: 0.8 g/dL (ref 0.4–1.0)
B-Globulin SerPl Elph-Mcnc: 0.7 g/dL (ref 0.7–1.3)
Gamma Glob SerPl Elph-Mcnc: 1.2 g/dL (ref 0.4–1.8)
Globulin, Total: 3 g/dL (ref 2.2–3.9)
IGA/IMMUNOGLOBULIN A, SERUM: 17 mg/dL — AB (ref 64–422)
IGM (IMMUNOGLOBIN M), SRM: 68 mg/dL (ref 26–217)
M Protein SerPl Elph-Mcnc: 1.1 g/dL — ABNORMAL HIGH
Total Protein: 6.3 g/dL (ref 6.0–8.5)

## 2016-04-03 ENCOUNTER — Ambulatory Visit (HOSPITAL_BASED_OUTPATIENT_CLINIC_OR_DEPARTMENT_OTHER): Payer: Medicare Other | Admitting: Hematology and Oncology

## 2016-04-03 ENCOUNTER — Ambulatory Visit (HOSPITAL_BASED_OUTPATIENT_CLINIC_OR_DEPARTMENT_OTHER): Payer: Medicare Other

## 2016-04-03 VITALS — BP 128/62 | HR 80 | Temp 98.4°F | Resp 18

## 2016-04-03 DIAGNOSIS — M549 Dorsalgia, unspecified: Secondary | ICD-10-CM

## 2016-04-03 DIAGNOSIS — I2699 Other pulmonary embolism without acute cor pulmonale: Secondary | ICD-10-CM

## 2016-04-03 DIAGNOSIS — E440 Moderate protein-calorie malnutrition: Secondary | ICD-10-CM

## 2016-04-03 DIAGNOSIS — Z5112 Encounter for antineoplastic immunotherapy: Secondary | ICD-10-CM | POA: Diagnosis present

## 2016-04-03 DIAGNOSIS — C9002 Multiple myeloma in relapse: Secondary | ICD-10-CM

## 2016-04-03 DIAGNOSIS — F419 Anxiety disorder, unspecified: Secondary | ICD-10-CM | POA: Diagnosis not present

## 2016-04-03 DIAGNOSIS — K219 Gastro-esophageal reflux disease without esophagitis: Secondary | ICD-10-CM | POA: Diagnosis not present

## 2016-04-03 DIAGNOSIS — F5102 Adjustment insomnia: Secondary | ICD-10-CM

## 2016-04-03 DIAGNOSIS — M4850XD Collapsed vertebra, not elsewhere classified, site unspecified, subsequent encounter for fracture with routine healing: Secondary | ICD-10-CM

## 2016-04-03 DIAGNOSIS — IMO0001 Reserved for inherently not codable concepts without codable children: Secondary | ICD-10-CM

## 2016-04-03 DIAGNOSIS — G8929 Other chronic pain: Secondary | ICD-10-CM | POA: Diagnosis not present

## 2016-04-03 DIAGNOSIS — R197 Diarrhea, unspecified: Secondary | ICD-10-CM

## 2016-04-03 DIAGNOSIS — F410 Panic disorder [episodic paroxysmal anxiety] without agoraphobia: Secondary | ICD-10-CM

## 2016-04-03 DIAGNOSIS — C9001 Multiple myeloma in remission: Secondary | ICD-10-CM

## 2016-04-03 MED ORDER — LORAZEPAM 2 MG/ML IJ SOLN
0.5000 mg | Freq: Once | INTRAMUSCULAR | Status: AC
  Administered 2016-04-03: 0.5 mg via INTRAVENOUS

## 2016-04-03 MED ORDER — SODIUM CHLORIDE 0.9 % IV SOLN
Freq: Once | INTRAVENOUS | Status: AC
  Administered 2016-04-03: 11:00:00 via INTRAVENOUS

## 2016-04-03 MED ORDER — PROCHLORPERAZINE MALEATE 10 MG PO TABS
ORAL_TABLET | ORAL | Status: AC
  Filled 2016-04-03: qty 1

## 2016-04-03 MED ORDER — HYDROMORPHONE HCL 4 MG/ML IJ SOLN
INTRAMUSCULAR | Status: AC
  Filled 2016-04-03: qty 1

## 2016-04-03 MED ORDER — ZOLEDRONIC ACID 4 MG/5ML IV CONC
3.3000 mg | Freq: Once | INTRAVENOUS | Status: AC
  Administered 2016-04-03: 3.3 mg via INTRAVENOUS
  Filled 2016-04-03: qty 4.13

## 2016-04-03 MED ORDER — ACETAMINOPHEN 325 MG PO TABS
650.0000 mg | ORAL_TABLET | Freq: Once | ORAL | Status: AC
  Administered 2016-04-03: 650 mg via ORAL

## 2016-04-03 MED ORDER — ACETAMINOPHEN 325 MG PO TABS
ORAL_TABLET | ORAL | Status: AC
  Filled 2016-04-03: qty 2

## 2016-04-03 MED ORDER — DIPHENHYDRAMINE HCL 25 MG PO CAPS
ORAL_CAPSULE | ORAL | Status: AC
  Filled 2016-04-03: qty 1

## 2016-04-03 MED ORDER — HYDROMORPHONE HCL 4 MG/ML IJ SOLN
1.0000 mg | INTRAMUSCULAR | Status: DC | PRN
  Administered 2016-04-03: 1 mg via INTRAVENOUS

## 2016-04-03 MED ORDER — METHYLPREDNISOLONE SODIUM SUCC 125 MG IJ SOLR
125.0000 mg | Freq: Once | INTRAMUSCULAR | Status: AC
  Administered 2016-04-03: 125 mg via INTRAVENOUS

## 2016-04-03 MED ORDER — LEVOTHYROXINE SODIUM 50 MCG PO TABS: 50.0000 ug | ORAL_TABLET | Freq: Every day | ORAL | 9 refills | Status: AC

## 2016-04-03 MED ORDER — ALPRAZOLAM 0.25 MG PO TABS: 0.2500 mg | ORAL_TABLET | Freq: Three times a day (TID) | ORAL | 0 refills | Status: DC | PRN

## 2016-04-03 MED ORDER — PROCHLORPERAZINE MALEATE 10 MG PO TABS
10.0000 mg | ORAL_TABLET | Freq: Once | ORAL | Status: AC
  Administered 2016-04-03: 10 mg via ORAL

## 2016-04-03 MED ORDER — DIPHENHYDRAMINE HCL 25 MG PO CAPS
50.0000 mg | ORAL_CAPSULE | Freq: Once | ORAL | Status: AC
  Administered 2016-04-03: 50 mg via ORAL

## 2016-04-03 MED ORDER — SODIUM CHLORIDE 0.9% FLUSH
10.0000 mL | INTRAVENOUS | Status: DC | PRN
  Administered 2016-04-03: 10 mL
  Filled 2016-04-03: qty 10

## 2016-04-03 MED ORDER — DARATUMUMAB CHEMO INJECTION 400 MG/20ML
15.3000 mg/kg | Freq: Once | INTRAVENOUS | Status: AC
  Administered 2016-04-03: 800 mg via INTRAVENOUS
  Filled 2016-04-03: qty 40

## 2016-04-03 MED ORDER — HEPARIN SOD (PORK) LOCK FLUSH 100 UNIT/ML IV SOLN
500.0000 [IU] | Freq: Once | INTRAVENOUS | Status: AC | PRN
  Administered 2016-04-03: 500 [IU]
  Filled 2016-04-03: qty 5

## 2016-04-03 MED ORDER — METHYLPREDNISOLONE SODIUM SUCC 125 MG IJ SOLR
INTRAMUSCULAR | Status: AC
  Filled 2016-04-03: qty 2

## 2016-04-03 MED ORDER — LORAZEPAM 2 MG/ML IJ SOLN
INTRAMUSCULAR | Status: AC
  Filled 2016-04-03: qty 1

## 2016-04-03 NOTE — Assessment & Plan Note (Signed)
This is related to recent laxatives. I recommend holding off laxatives

## 2016-04-03 NOTE — Assessment & Plan Note (Addendum)
She is less anxious She felt that the regular doses of Xanax is helping. I refill her prescription today I felt that some of her sensation of feeling jittery could be related to high-dose steroids. I recommend reducing the dose of dexamethasone to 2 mg every other day

## 2016-04-03 NOTE — Assessment & Plan Note (Signed)
She had recent poor appetite, weight loss and evidence of protein calorie malnutrition. She is referred to see a dietitian. She also has significant history of depression that would also contributed due to anorexia. I encouraged her to eat frequent small meals. With improvement of her appetite and side effects of steroids, I plan to reduce the dexamethasone to every other day.

## 2016-04-03 NOTE — Assessment & Plan Note (Signed)
This is improving on temazepam. I recommend she continues the same and I will reassess again in 2 weeks.

## 2016-04-03 NOTE — Patient Instructions (Signed)
[facility] Discharge Instructions for Patients Receiving Chemotherapy  Today you received the following chemotherapy agents Darzalex.  To help prevent nausea and vomiting after your treatment, we encourage you to take your nausea medication as directed.  If you develop nausea and vomiting that is not controlled by your nausea medication, call the clinic.   BELOW ARE SYMPTOMS THAT SHOULD BE REPORTED IMMEDIATELY:  *FEVER GREATER THAN 100.5 F  *CHILLS WITH OR WITHOUT FEVER  NAUSEA AND VOMITING THAT IS NOT CONTROLLED WITH YOUR NAUSEA MEDICATION  *UNUSUAL SHORTNESS OF BREATH  *UNUSUAL BRUISING OR BLEEDING  TENDERNESS IN MOUTH AND THROAT WITH OR WITHOUT PRESENCE OF ULCERS  *URINARY PROBLEMS  *BOWEL PROBLEMS  UNUSUAL RASH Items with * indicate a potential emergency and should be followed up as soon as possible.  Feel free to call the clinic you have any questions or concerns. The clinic phone number is [phone].  Please show the CHEMO ALERT CARD at check-in to the Emergency Department and triage nurse.    

## 2016-04-03 NOTE — Progress Notes (Signed)
[facility] OFFICE PROGRESS NOTE  Patient Care Team: Thressa Sheller, MD as PCP - General (Internal Medicine) Thressa Sheller, MD (Internal Medicine) Renella Cunas, MD (Inactive) as Attending Physician (Cardiology) Heath Lark, MD as Consulting Physician (Hematology and Oncology) Luanne Bras, MD as Consulting Physician (Interventional Radiology)  SUMMARY OF ONCOLOGIC HISTORY: Oncology History   The     Multiple myeloma in relapse [facility])   11/12/2008 Imaging    Skeletal survey showed lytic lesion in the right femur compatible with  myeloma. There were Questionable skull lesions      11/15/2008 Bone Marrow Biopsy    BONE MARROW ASPIRATE AND BIOPSY: showed NORMOCELLULAR MARROW FOR AGE WITH PLASMA CELL DYSCRASIA  (PLASMA CELLS 25%). 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 Subsequently she was placed on Revlimid & Dexamethasone alone      05/12/2013 Bone Marrow Biopsy    Bone marrow biopsy showed persistent plasma cells. Blood work confirmed VGPR status      06/11/2014 Tumor Marker    Bloodwork show disease relapse      06/26/2014 Procedure    She has placement of port      07/05/2014 - 07/26/2014 Chemotherapy    She received Elotuzumab and Revlimid. Rx was discontinued with elotuzumab per patient preference      08/01/2014 - 08/05/2014 Hospital Admission    She was admitted to the hospital for kyphoplasty and pain management after traumatic compression fracture      08/23/2014 - 04/18/2015 Chemotherapy    She was placed on dexamethasone & Revlimid      04/11/2015 Tumor Marker    Blood work showed VGPR. Treatment was discontinued and she is maintained on Zometa only      10/20/2015 Imaging    MRI spine showed acute/subacute superior endplate compression fractures at T2 and T9 are incompletely healed.        11/05/2015 Procedure    Status post vertebral body augmentation using balloon kyphoplasty at T9 as described without event      12/27/2015 Procedure    She had port placement      01/02/2016 -  Chemotherapy    She received Daratumumab, Dexamethasone and Velcade. Velcade was discontinued after 01/30/16 after less than optimum response is noted and her Rx is switched to Daratumumab, dexamethasone and Pomalidomide      01/20/2016 Imaging    MRi spine showed acute to subacute pathologic fractures of T10 and T11. Up to 70% loss of vertebral body height at the former with mild retropulsion of bone contributing to mild spinal stenosis at the T10-T11 level.      03/16/2016 - 03/17/2016 Hospital Admission    She was admitted to the hospital because of shortness of breath      03/20/2016 Procedure    Status post vertebral body augmentation for painful compression fractures at T10 and T11 using vertebroplasty technique.       03/24/2016 Imaging    CT angiogram showed pulmonary embolus within segmental branches to the right lower lung lobe, and within subsegmental branches to the left upper lobe. Small right and trace left pleural effusions, with underlying interstitial prominence, concerning for mild interstitial edema. She and and atenolol was sitting at the [facility] this is okay to stop diffuse coronary artery calcifications seen. Mild biatrial enlargement noted. Scarring at the lung apices, with associated calcification.  03/24/2016 - 03/29/2016 Hospital Admission    The patient was admitted due to shortness of breath and was found to have pulmonary emboli       INTERVAL HISTORY: Please see below for problem oriented charting. She is seen in the infusion room with her daughter. She appears less anxious. Overall, she felt better compared to 3 days ago. She slept well. Pain appears to be under control. She has some loose bowel movement recently but is on Miralax. The patient denies any  recent signs or symptoms of bleeding such as spontaneous epistaxis, hematuria or hematochezia. She complained of recent burping and bloating  REVIEW OF SYSTEMS:   Constitutional: Denies fevers, chills or abnormal weight loss Eyes: Denies blurriness of vision Ears, nose, mouth, throat, and face: Denies mucositis or sore throat Respiratory: Denies cough, dyspnea or wheezes Cardiovascular: Denies palpitation, chest discomfort or lower extremity swelling Skin: Denies abnormal skin rashes Lymphatics: Denies new lymphadenopathy or easy bruising Neurological:Denies numbness, tingling or new weaknesses Behavioral/Psych: Mood is stable, no new changes  All other systems were reviewed with the patient and are negative.  I have reviewed the past medical history, past surgical history, social history and family history with the patient and they are unchanged from previous note.  ALLERGIES:  is allergic to no known allergies.  MEDICATIONS:  Current Outpatient Prescriptions  Medication Sig Dispense Refill  . ALPRAZolam (XANAX) 0.25 MG tablet Take 1 tablet (0.25 mg total) by mouth 3 (three) times daily as needed for anxiety. 90 tablet 0  . apixaban (ELIQUIS) 5 MG TABS tablet Take 1 tablet (5 mg total) by mouth 2 (two) times daily. apixaban take 10 mg two times daily for 4 days then take apixaban  5 mg twice daily 60 tablet 2  . Artificial Tear Ointment (ARTIFICIAL TEARS) ointment Apply 1 drop to eye as needed (dry eyes).     . cholecalciferol (VITAMIN D) 1000 UNITS tablet Take 1,000 Units by mouth daily.    [name] dexamethasone (DECADRON) 4 MG tablet Take 4 mg by mouth daily.     . folic acid (FOLVITE) 1 MG tablet Take 1 tablet (1 mg total) by mouth daily. 30 tablet 0  . furosemide (LASIX) 20 MG tablet Take 1 tablet daily as needed for swelling (Patient taking differently: Take 20 mg by mouth daily as needed for fluid. Take 1 tablet daily as needed for swelling) 30 tablet 3  . gabapentin (NEURONTIN) 300 MG  capsule Take 300 mg by mouth 3 (three) times daily.    [name] levothyroxine (SYNTHROID, LEVOTHROID) 50 MCG tablet Take 1 tablet (50 mcg total) by mouth daily before breakfast. 30 tablet 9  . morphine (MS CONTIN) 60 MG 12 hr tablet Take 1 tablet (60 mg total) by mouth every 12 (twelve) hours. 60 tablet 0  . Multiple Vitamin (MULTIVITAMIN WITH MINERALS) TABS tablet Take 1 tablet by mouth daily.    . nitroGLYCERIN (NITROSTAT) 0.4 MG SL tablet Place 1 tablet (0.4 mg total) under the tongue every 5 (five) minutes as needed for chest pain. 25 tablet 11  . oxycodone (ROXICODONE) 30 MG immediate release tablet Take 1 tablet (30 mg total) by mouth every 4 (four) hours as needed. 90 tablet 0  . polyethylene glycol (MIRALAX / GLYCOLAX) packet Take 17 g by mouth daily as needed for mild constipation. 14 each 0  . Polyvinyl Alcohol-Povidone (REFRESH OP) Place 2 drops into both eyes 2 (two) times daily as needed (dry eyes).    [name] senna-docusate (SENOKOT-S) 8.6-50  MG tablet Take 1 tablet by mouth 2 (two) times daily. 30 tablet 0  . temazepam (RESTORIL) 15 MG capsule Take 1 capsule (15 mg total) by mouth at bedtime as needed for sleep. 30 capsule 0  . valsartan (DIOVAN) 160 MG tablet Take 1 tablet (160 mg total) by mouth daily. 30 tablet 3  . vitamin B-12 1000 MCG tablet Take 1 tablet (1,000 mcg total) by mouth daily. 30 tablet 0   No current facility-administered medications for this visit.    Facility-Administered Medications Ordered in Other Visits  Medication Dose Route Frequency Provider Last Rate Last Dose  . heparin lock flush 100 unit/mL  500 Units Intracatheter Once PRN Heath Lark, MD      . sodium chloride flush (NS) 0.9 % injection 10 mL  10 mL Intracatheter PRN Heath Lark, MD        PHYSICAL EXAMINATION: ECOG PERFORMANCE STATUS: 1 - Symptomatic but completely ambulatory  Vitals:   04/03/16 1058  BP: (!) 105/56  Pulse: 81  Resp: 18  Temp: 98.4 F (36.9 C)   There were no vitals filed for this  visit.  GENERAL:alert, no distress and comfortable. She is less jittery compared to previous visit SKIN: skin color, texture, turgor are normal, no rashes or significant lesions EYES: normal, Conjunctiva are pink and non-injected, sclera clear Musculoskeletal:no cyanosis of digits and no clubbing  NEURO: alert & oriented x 3 with fluent speech, no focal motor/sensory deficits  LABORATORY DATA:  I have reviewed the data as listed    Component Value Date/Time   NA 132 (L) 03/31/2016 1032   K 4.5 03/31/2016 1032   CL 98 (L) 03/28/2016 0450   CL 103 11/25/2012 1328   CO2 25 03/31/2016 1032   GLUCOSE 120 03/31/2016 1032   GLUCOSE 91 11/25/2012 1328   BUN 21.1 03/31/2016 1032   CREATININE 0.7 03/31/2016 1032   CALCIUM 8.8 03/31/2016 1032   PROT 6.3 03/31/2016 1032   PROT 6.9 03/31/2016 1032   ALBUMIN 2.8 (L) 03/31/2016 1032   AST 26 03/31/2016 1032   ALT 26 03/31/2016 1032   ALKPHOS 94 03/31/2016 1032   BILITOT 0.46 03/31/2016 1032   GFRNONAA >60 03/28/2016 0450   GFRAA >60 03/28/2016 0450    No results found for: SPEP, UPEP  Lab Results  Component Value Date   WBC 6.2 03/31/2016   NEUTROABS 5.6 03/31/2016   HGB 9.5 (L) 03/31/2016   HCT 28.4 (L) 03/31/2016   MCV 107.4 (H) 03/31/2016   PLT 334 03/31/2016      Chemistry      Component Value Date/Time   NA 132 (L) 03/31/2016 1032   K 4.5 03/31/2016 1032   CL 98 (L) 03/28/2016 0450   CL 103 11/25/2012 1328   CO2 25 03/31/2016 1032   BUN 21.1 03/31/2016 1032   CREATININE 0.7 03/31/2016 1032      Component Value Date/Time   CALCIUM 8.8 03/31/2016 1032   ALKPHOS 94 03/31/2016 1032   AST 26 03/31/2016 1032   ALT 26 03/31/2016 1032   BILITOT 0.46 03/31/2016 1032      ASSESSMENT & PLAN:  Multiple myeloma in relapse ([place]) I reviewed the recent myeloma panel with her and her daughter. The patient have almost 50% drop in serum light chain, M spike as well as normalization of serum IgG level. She is delighted with the  news. I will proceed with infusion treatment today but will continue to hold oral chemotherapy. I will reassess again in  2 weeks and if her pancytopenia improves, I will resume her oral chemotherapy  Anxiety attack She is less anxious She felt that the regular doses of Xanax is helping. I refill her prescription today I felt that some of her sensation of feeling jittery could be related to high-dose steroids. I recommend reducing the dose of dexamethasone to 2 mg every other day  Chronic back pain greater than 3 months duration She has multifactorial chronic back pain. With recent change in her prescription pain medication, with MS Contin to 60 mg twice a day and oxycodone immediate release breakthrough pain medicine to 30 mg every 4 hours as needed for breakthrough pain, her pain is under good control She had recent kyphoplasty She is on calcium and vitamin D We will continue to same dose and I will reassess again in 2 weeks  Pulmonary embolism (HCC) Her oxygenation is good. She does not need oxygen. She will continue anticoagulation therapy indefinitely.   Protein-calorie malnutrition, moderate ([place]) She had recent poor appetite, weight loss and evidence of protein calorie malnutrition. She is referred to see a dietitian. She also has significant history of depression that would also contributed due to anorexia. I encouraged her to eat frequent small meals. With improvement of her appetite and side effects of steroids, I plan to reduce the dexamethasone to every other day.  Insomnia due to stress This is improving on temazepam. I recommend she continues the same and I will reassess again in 2 weeks.  Diarrhea This is related to recent laxatives. I recommend holding off laxatives  GERD (gastroesophageal reflux disease) She had recent reflux symptoms with bloating and burping. Hopefully, with reduced dose dexamethasone, this will improve. In the meantime, I recommend her to  chew Tums as needed   No orders of the defined types were placed in this encounter.  All questions were answered. The patient knows to call the clinic with any problems, questions or concerns. No barriers to learning was detected. I spent 25 minutes counseling the patient face to face. The total time spent in the appointment was 30 minutes and more than 50% was on counseling and review of test results     Heath Lark, MD 04/03/2016 12:58 PM

## 2016-04-03 NOTE — Assessment & Plan Note (Signed)
Her oxygenation is good. She does not need oxygen. She will continue anticoagulation therapy indefinitely.

## 2016-04-03 NOTE — Assessment & Plan Note (Signed)
She has multifactorial chronic back pain. With recent change in her prescription pain medication, with MS Contin to 60 mg twice a day and oxycodone immediate release breakthrough pain medicine to 30 mg every 4 hours as needed for breakthrough pain, her pain is under good control She had recent kyphoplasty She is on calcium and vitamin D We will continue to same dose and I will reassess again in 2 weeks

## 2016-04-03 NOTE — Assessment & Plan Note (Signed)
I reviewed the recent myeloma panel with her and her daughter. The patient have almost 50% drop in serum light chain, M spike as well as normalization of serum IgG level. She is delighted with the news. I will proceed with infusion treatment today but will continue to hold oral chemotherapy. I will reassess again in 2 weeks and if her pancytopenia improves, I will resume her oral chemotherapy

## 2016-04-03 NOTE — Assessment & Plan Note (Signed)
She had recent reflux symptoms with bloating and burping. Hopefully, with reduced dose dexamethasone, this will improve. In the meantime, I recommend her to chew Tums as needed

## 2016-04-05 ENCOUNTER — Encounter (HOSPITAL_COMMUNITY): Payer: Self-pay | Admitting: Nurse Practitioner

## 2016-04-05 ENCOUNTER — Emergency Department (HOSPITAL_COMMUNITY)
Admission: EM | Admit: 2016-04-05 | Discharge: 2016-04-06 | Disposition: A | Discharge status: Home / Self Care | Payer: Medicare Other | Attending: Emergency Medicine | Admitting: Emergency Medicine

## 2016-04-05 ENCOUNTER — Other Ambulatory Visit: Payer: Self-pay

## 2016-04-05 ENCOUNTER — Emergency Department (HOSPITAL_COMMUNITY): Payer: Medicare Other

## 2016-04-05 DIAGNOSIS — E039 Hypothyroidism, unspecified: Secondary | ICD-10-CM | POA: Insufficient documentation

## 2016-04-05 DIAGNOSIS — Z7901 Long term (current) use of anticoagulants: Secondary | ICD-10-CM | POA: Diagnosis not present

## 2016-04-05 DIAGNOSIS — Z79899 Other long term (current) drug therapy: Secondary | ICD-10-CM | POA: Diagnosis not present

## 2016-04-05 DIAGNOSIS — Z8579 Personal history of other malignant neoplasms of lymphoid, hematopoietic and related tissues: Secondary | ICD-10-CM | POA: Diagnosis not present

## 2016-04-05 DIAGNOSIS — F419 Anxiety disorder, unspecified: Secondary | ICD-10-CM | POA: Diagnosis present

## 2016-04-05 DIAGNOSIS — Z853 Personal history of malignant neoplasm of breast: Secondary | ICD-10-CM | POA: Insufficient documentation

## 2016-04-05 DIAGNOSIS — I2510 Atherosclerotic heart disease of native coronary artery without angina pectoris: Secondary | ICD-10-CM | POA: Diagnosis not present

## 2016-04-05 DIAGNOSIS — I10 Essential (primary) hypertension: Secondary | ICD-10-CM | POA: Diagnosis not present

## 2016-04-05 LAB — CBC WITH DIFFERENTIAL/PLATELET
BASOS ABS: 0 10*3/uL (ref 0.0–0.1)
BASOS PCT: 0 %
EOS ABS: 0 10*3/uL (ref 0.0–0.7)
EOS PCT: 0 %
HCT: 28.4 % — ABNORMAL LOW (ref 36.0–46.0)
HEMOGLOBIN: 9.6 g/dL — AB (ref 12.0–15.0)
LYMPHS ABS: 1 10*3/uL (ref 0.7–4.0)
Lymphocytes Relative: 16 %
MCH: 34.7 pg — ABNORMAL HIGH (ref 26.0–34.0)
MCHC: 33.8 g/dL (ref 30.0–36.0)
MCV: 102.5 fL — ABNORMAL HIGH (ref 78.0–100.0)
Monocytes Absolute: 0.3 10*3/uL (ref 0.1–1.0)
Monocytes Relative: 5 %
NEUTROS PCT: 79 %
Neutro Abs: 4.8 10*3/uL (ref 1.7–7.7)
PLATELETS: 337 10*3/uL (ref 150–400)
RBC: 2.77 MIL/uL — AB (ref 3.87–5.11)
RDW: 16 % — ABNORMAL HIGH (ref 11.5–15.5)
WBC: 6.1 10*3/uL (ref 4.0–10.5)

## 2016-04-05 LAB — BASIC METABOLIC PANEL
Anion gap: 6 (ref 5–15)
BUN: 22 mg/dL — AB (ref 6–20)
CHLORIDE: 97 mmol/L — AB (ref 101–111)
CO2: 28 mmol/L (ref 22–32)
CREATININE: 0.68 mg/dL (ref 0.44–1.00)
Calcium: 8.4 mg/dL — ABNORMAL LOW (ref 8.9–10.3)
Glucose, Bld: 98 mg/dL (ref 65–99)
Potassium: 4.4 mmol/L (ref 3.5–5.1)
SODIUM: 131 mmol/L — AB (ref 135–145)

## 2016-04-05 LAB — I-STAT TROPONIN, ED: TROPONIN I, POC: 0.02 ng/mL (ref 0.00–0.08)

## 2016-04-05 MED ORDER — LORAZEPAM 2 MG/ML IJ SOLN
1.0000 mg | Freq: Once | INTRAMUSCULAR | Status: AC
  Administered 2016-04-05: 1 mg via INTRAVENOUS
  Filled 2016-04-05: qty 1

## 2016-04-05 MED ORDER — SODIUM CHLORIDE 0.9 % IV BOLUS (SEPSIS)
250.0000 mL | Freq: Once | INTRAVENOUS | Status: AC
  Administered 2016-04-05: 250 mL via INTRAVENOUS

## 2016-04-05 MED ORDER — SODIUM CHLORIDE 0.9 % IV SOLN
INTRAVENOUS | Status: DC
  Administered 2016-04-05: via INTRAVENOUS

## 2016-04-05 NOTE — ED Notes (Signed)
EKG given to EDP,Zackowski,MD., for review. 

## 2016-04-05 NOTE — ED Notes (Signed)
Pt. Made aware for the need of urine. 

## 2016-04-05 NOTE — ED Triage Notes (Signed)
Pt is presented from home, reportedly had an episode of shortness of breath and being severely anxious despite taking her xanax that she takes TID. Oncology pt with multiple myeloma recently hospitalized for PE.

## 2016-04-06 MED ORDER — LORAZEPAM 1 MG PO TABS: 1.0000 mg | ORAL_TABLET | Freq: Four times a day (QID) | ORAL | 0 refills | Status: DC | PRN

## 2016-04-06 NOTE — ED Provider Notes (Signed)
[facility] DEPT Provider Note   CSN: [id number] Arrival date & time: 04/05/16  2045     History   Chief Complaint Chief Complaint  Patient presents with  . Shortness of Breath  . Anxiety  . CA Pt    HPI Natasha Jackson is a 82 y.o. female.  Patient with history of multiple myeloma recent oncology visit showed some improvement in the status of this. Patient also with new diagnosis of pulmonary embolus confirmed by CT angios and is on the blood thinner Eloquist.  Patient with known history of anxiety. Is on Xanax on a regular basis. All her pain medication and anxiety medication is managed by her oncologist. Patient over the past 3 days according to her daughter has had increase anxiety. Patient stating that she feels very anxious think she can have panic attack and feels like she can't breathe. Room air sats have been consistently in the 90s. Even when she was anxious she was satting more than 93-95%.      Past Medical History:  Diagnosis Date  . Aortic regurgitation   . Breast cancer ([place])   . Coronary artery disease   . DCIS (ductal carcinoma in situ) 08/04/2012  . Depression 09/05/2014  . Dysphagia 01/23/2016  . Hot flash not due to menopause 12/23/2011  . HTN (hypertension)   . Hypothyroidism 10/18/2014  . Multiple myeloma   . Multiple myeloma ([place]) 10/11/2015  . Neuropathy ([place]) 12/23/2011  . Spinal fracture of T12 vertebra ([facility])    vertebral fx    Patient Active Problem List   Diagnosis Date Noted  . Diarrhea 04/03/2016  . Insomnia due to stress 03/31/2016  . Anxiety attack 03/31/2016  . Goals of care, counseling/discussion 03/31/2016  . Acute respiratory failure with hypoxia ([place]) 03/25/2016  . Pulmonary embolus ([place]) 03/25/2016  . Pulmonary embolism ([place]) 03/24/2016  . Dizziness 03/12/2016  . Quality of life palliative care encounter 02/07/2016  . Dysphagia 01/23/2016  . Protein-calorie malnutrition, moderate ([place]) 01/23/2016  . GERD (gastroesophageal reflux  disease) 01/09/2016  . Anemia in neoplastic disease 01/09/2016  . Constipation due to opioid therapy 10/21/2015  . Multiple myeloma ([place]) 10/11/2015  . Chronic back pain greater than 3 months duration 09/26/2015  . Left leg pain 11/22/2014  . Rib pain on left side 11/22/2014  . Hypothyroidism 10/18/2014  . Neutropenia ([place]) 09/20/2014  . Chest pain 09/20/2014  . Depression 09/05/2014  . Arthropathy   . Dehydration, moderate 08/01/2014  . Vertebral compression fracture ([place]) 07/19/2014  . Pancytopenia due to antineoplastic chemotherapy ([place]) 07/05/2014  . Rib pain on right side 06/18/2014  . Bradycardia 09/22/2012  . DCIS (ductal carcinoma in situ) 08/04/2012  . Hot flash not due to menopause 12/23/2011  . Neuropathy due to chemotherapeutic drug ([place]) 12/23/2011  . OTHER SPECIFIED IDIOPATHIC PERIPHERAL NEUROPATHY 06/25/2010  . LBBB (left bundle branch block) 06/05/2009  . Multiple myeloma in relapse ([place]) 01/04/2009  . Essential hypertension, benign 01/04/2009  . DERMATITIS 01/03/2009  . ARTHRITIS 01/03/2009  . POSTMENOPAUSAL STATUS 01/03/2009    Past Surgical History:  Procedure Laterality Date  . INCISIONAL HERNIA REPAIR    . IR GENERIC HISTORICAL  03/20/2016   IR VERTEBROPLASTY EA ADDL (T&LS) BX INC UNI/BIL INC INJECT/IMAGING 03/20/2016 Luanne Bras, MD [facility]  . IR GENERIC HISTORICAL  03/20/2016   IR VERTEBROPLASTY CERV/THOR BX INC UNI/BIL INC/INJECT/IMAGING 03/20/2016 Luanne Bras, MD [facility]  . lymph node excision - left groin      OB History  No data available       Home Medications    Prior to Admission medications   Medication Sig Start Date End Date Taking? Authorizing Provider  ALPRAZolam (XANAX) 0.25 MG tablet Take 1 tablet (0.25 mg total) by mouth 3 (three) times daily as needed for anxiety. 04/03/16  Yes Heath Lark, MD  apixaban (ELIQUIS) 5 MG TABS tablet Take 1 tablet (5 mg total) by mouth 2 (two) times daily. apixaban take 10 mg two  times daily for 4 days then take apixaban  5 mg twice daily 03/29/16  Yes Kinnie Feil, MD  Artificial Tear Ointment (ARTIFICIAL TEARS) ointment Apply 1 drop to eye as needed (dry eyes).    Yes Historical Provider, MD  cholecalciferol (VITAMIN D) 1000 UNITS tablet Take 1,000 Units by mouth daily. 05/01/13  Yes Heath Lark, MD  dexamethasone (DECADRON) 4 MG tablet Take 2 mg by mouth every other day.    Yes Historical Provider, MD  folic acid (FOLVITE) 1 MG tablet Take 1 tablet (1 mg total) by mouth daily. 03/29/16  Yes Kinnie Feil, MD  furosemide (LASIX) 20 MG tablet Take 1 tablet daily as needed for swelling Patient taking differently: Take 20 mg by mouth daily as needed for fluid. Take 1 tablet daily as needed for swelling 03/24/16  Yes Eileen Stanford, PA-C  gabapentin (NEURONTIN) 300 MG capsule Take 300 mg by mouth 3 (three) times daily.   Yes Historical Provider, MD  levothyroxine (SYNTHROID, LEVOTHROID) 50 MCG tablet Take 1 tablet (50 mcg total) by mouth daily before breakfast. 04/03/16  Yes Heath Lark, MD  morphine (MS CONTIN) 60 MG 12 hr tablet Take 1 tablet (60 mg total) by mouth every 12 (twelve) hours. 03/12/16  Yes Heath Lark, MD  Multiple Vitamin (MULTIVITAMIN WITH MINERALS) TABS tablet Take 1 tablet by mouth daily.   Yes Historical Provider, MD  nitroGLYCERIN (NITROSTAT) 0.4 MG SL tablet Place 1 tablet (0.4 mg total) under the tongue every 5 (five) minutes as needed for chest pain. 03/17/16  Yes Josue Hector, MD  oxycodone (ROXICODONE) 30 MG immediate release tablet Take 1 tablet (30 mg total) by mouth every 4 (four) hours as needed. Patient taking differently: Take 30 mg by mouth every 4 (four) hours as needed for pain.  03/31/16  Yes Heath Lark, MD  polyethylene glycol (MIRALAX / GLYCOLAX) packet Take 17 g by mouth daily as needed for mild constipation. 03/29/16  Yes Kinnie Feil, MD  Polyvinyl Alcohol-Povidone (REFRESH OP) Place 2 drops into both eyes 2 (two) times daily as  needed (dry eyes).   Yes Historical Provider, MD  temazepam (RESTORIL) 15 MG capsule Take 1 capsule (15 mg total) by mouth at bedtime as needed for sleep. 03/31/16  Yes Heath Lark, MD  valsartan (DIOVAN) 160 MG tablet Take 1 tablet (160 mg total) by mouth daily. 10/23/15  Yes Eileen Stanford, PA-C  vitamin B-12 1000 MCG tablet Take 1 tablet (1,000 mcg total) by mouth daily. 03/29/16  Yes Kinnie Feil, MD  LORazepam (ATIVAN) 1 MG tablet Take 1 tablet (1 mg total) by mouth every 6 (six) hours as needed for anxiety. 04/06/16   Fredia Sorrow, MD  senna-docusate (SENOKOT-S) 8.6-50 MG tablet Take 1 tablet by mouth 2 (two) times daily. Patient not taking: Reported on 04/05/2016 03/29/16   Kinnie Feil, MD    Family History Family History  Problem Relation Age of Onset  . Stroke Father   . Heart failure Mother  Social History Social History  Substance Use Topics  . Smoking status: Never Smoker  . Smokeless tobacco: Never Used  . Alcohol use No     Allergies   No known allergies   Review of Systems Review of Systems  Constitutional: Positive for appetite change. Negative for fever.  HENT: Negative for congestion.   Eyes: Negative for redness.  Respiratory: Positive for shortness of breath.   Cardiovascular: Negative for chest pain.  Gastrointestinal: Negative for abdominal pain, nausea and vomiting.  Genitourinary: Negative for dysuria.  Neurological: Positive for weakness. Negative for headaches.  Hematological: Bruises/bleeds easily.  Psychiatric/Behavioral: Negative for confusion. The patient is nervous/anxious.      Physical Exam Updated Vital Signs BP (!) 143/52 (BP Location: Right Arm)   Pulse 74   Temp 97.9 F (36.6 C) (Oral)   Resp 14   SpO2 92%   Physical Exam  Constitutional: She appears well-developed and well-nourished. No distress.  HENT:  Head: Normocephalic and atraumatic.  Eyes: EOM are normal. Pupils are equal, round, and reactive to light.    Neck: Normal range of motion. Neck supple.  Cardiovascular: Normal rate, regular rhythm and normal heart sounds.   Pulmonary/Chest: Effort normal and breath sounds normal. No respiratory distress.  Abdominal: Soft. Bowel sounds are normal. There is no tenderness.  Musculoskeletal: Normal range of motion.  Neurological: She is alert. No cranial nerve deficit. She exhibits normal muscle tone. Coordination normal.  Some generalized weakness no focal weakness.  Skin: Skin is warm.  Psychiatric:  Very anxious  Nursing note and vitals reviewed.    ED Treatments / Results  Labs (all labs ordered are listed, but only abnormal results are displayed) Labs Reviewed  BASIC METABOLIC PANEL - Abnormal; Notable for the following:       Result Value   Sodium 131 (*)    Chloride 97 (*)    BUN 22 (*)    Calcium 8.4 (*)    All other components within normal limits  CBC WITH DIFFERENTIAL/PLATELET - Abnormal; Notable for the following:    RBC 2.77 (*)    Hemoglobin 9.6 (*)    HCT 28.4 (*)    MCV 102.5 (*)    MCH 34.7 (*)    RDW 16.0 (*)    All other components within normal limits  I-STAT TROPOININ, ED    EKG  EKG Interpretation  Date/Time:  Sunday April 05 2016 21:29:58 EDT Ventricular Rate:  84 PR Interval:    QRS Duration: 137 QT Interval:  414 QTC Calculation: 490 R Axis:   -87 Text Interpretation:  Sinus rhythm Probable left atrial enlargement Left bundle branch block No significant change since last tracing Confirmed by Aster Screws  MD, Saleen Peden [phone]) on 04/05/2016 10:08:14 PM       Radiology Dg Chest 2 View  Result Date: 04/05/2016 CLINICAL DATA:  Shortness of breath. EXAM: CHEST  2 VIEW COMPARISON:  Radiographs of March 24, 2016. FINDINGS: Stable cardiomediastinal silhouette. Atherosclerosis of thoracic aorta is noted. Stable position of right-sided Port-A-Cath. No pneumothorax is noted. Status post kyphoplasty at 4 levels of lower thoracic spine. Mild bibasilar  subsegmental atelectasis is noted with minimal pleural effusions. IMPRESSION: Aortic atherosclerosis. Mild bibasilar subsegmental atelectasis with minimal associated pleural effusions. Electronically Signed   By: Marijo Conception, M.D.   On: 04/05/2016 21:59    Procedures Procedures (including critical care time)  Medications Ordered in ED Medications  0.9 %  sodium chloride infusion ( Intravenous New Bag/Given 04/05/16 2330)  sodium chloride 0.9 % bolus 250 mL (0 mLs Intravenous Stopped 04/06/16 0008)  LORazepam (ATIVAN) injection 1 mg (1 mg Intravenous Given 04/05/16 2308)     Initial Impression / Assessment and Plan / ED Course  I have reviewed the triage vital signs and the nursing notes.  Pertinent labs & imaging results that were available during my care of the patient were reviewed by me and considered in my medical decision making (see chart for details).  Clinical Course    Patient with known history of anxiety. Patient also with new diagnosis of pulmonary embolism diagnosed about a week ago patient is taking her Eloquist. Patient also with history of multiple myeloma. Oncology visit just a few days ago actually showed improvement in the lab findings related to this. Patient presents tonight with worsening symptoms of increased anxiety over the past few days. Patient also with some increased overall weakness.  Workup here tonight shows no evidence of hypoxia. Room air sats are in the upper 90s. Chest x-ray without any acute findings. Labs without any significant change from recent labs. Patient received 1 mg of Ativan by mouth with significant improvement in the anxiety. Will discharge patient home with Ativan to supplement her current regimen of Xanax. They will contact her oncology doctor that manages her pain medicine and anxiety medicines this week.  Clinically no concern for any worsening hypoxia or respiratory distress or any concern for any worsening of bilateral pulmonary  embolus.  Final Clinical Impressions(s) / ED Diagnoses   Final diagnoses:  Anxiety    New Prescriptions New Prescriptions   LORAZEPAM (ATIVAN) 1 MG TABLET    Take 1 tablet (1 mg total) by mouth every 6 (six) hours as needed for anxiety.     Fredia Sorrow, MD 04/06/16 [phone]

## 2016-04-06 NOTE — Discharge Instructions (Signed)
Use the Ativan to supplement her current medication regimen. Contact her oncologist for further discussion about the treatment for the anxiety. Rest of workup here today without any significant findings. No concerns for any acute pulmonary problems no hypoxia.

## 2016-04-07 ENCOUNTER — Ambulatory Visit (HOSPITAL_COMMUNITY): Admission: RE | Admit: 2016-04-07 | Payer: Medicare Other | Source: Ambulatory Visit

## 2016-04-08 ENCOUNTER — Ambulatory Visit (HOSPITAL_BASED_OUTPATIENT_CLINIC_OR_DEPARTMENT_OTHER): Payer: Medicare Other | Admitting: Hematology and Oncology

## 2016-04-08 ENCOUNTER — Encounter: Payer: Self-pay | Admitting: Hematology and Oncology

## 2016-04-08 ENCOUNTER — Telehealth: Payer: Self-pay | Admitting: Hematology and Oncology

## 2016-04-08 DIAGNOSIS — F410 Panic disorder [episodic paroxysmal anxiety] without agoraphobia: Secondary | ICD-10-CM | POA: Diagnosis not present

## 2016-04-08 DIAGNOSIS — C9002 Multiple myeloma in relapse: Secondary | ICD-10-CM

## 2016-04-08 DIAGNOSIS — R5381 Other malaise: Secondary | ICD-10-CM | POA: Diagnosis not present

## 2016-04-08 DIAGNOSIS — Z7189 Other specified counseling: Secondary | ICD-10-CM

## 2016-04-08 MED ORDER — DEXAMETHASONE 1 MG PO TABS: 1.0000 mg | ORAL_TABLET | Freq: Every day | ORAL | 0 refills | Status: DC

## 2016-04-08 MED ORDER — LORAZEPAM 1 MG PO TABS: 1.0000 mg | ORAL_TABLET | Freq: Three times a day (TID) | ORAL | 0 refills | Status: AC | PRN

## 2016-04-08 NOTE — Telephone Encounter (Signed)
NO LOS/ORDERS/REFERRAL 10/4

## 2016-04-08 NOTE — Assessment & Plan Note (Signed)
She has recurrent anxiety attacks with feeling jittery, depressed and sense of hopelessness because of loss of function, independence and other activities that she used to enjoy prior to feeling unwell related to recent myeloma relapse The patient is tearful. According to her daughter, the patient has been asking for more frequent than 3 times a day benzodiazepines. The patient appears to feel better while on lorazepam. I have discontinue Xanax and switch her to lorazepam. We also discussed starting her on mood stabilizers/antidepressants but the patient declined. We also discussed about potential referral to psychologist, psychiatrist or to talk to a pastor but the patient also declined. We discussed potential referral to palliative care consult and she declines. With changes of medications above, I will reassess next week

## 2016-04-08 NOTE — Assessment & Plan Note (Addendum)
Her recent myeloma panel show positive response to treatment. I will see her again next week for further supportive care I plan to reduce the dose of dexamethasone to 1 mg every other day with further dose reduction in her next visit

## 2016-04-08 NOTE — Progress Notes (Signed)
[facility] OFFICE PROGRESS NOTE  Patient Care Team: Thressa Sheller, MD as PCP - General (Internal Medicine) Thressa Sheller, MD (Internal Medicine) Renella Cunas, MD (Inactive) as Attending Physician (Cardiology) Heath Lark, MD as Consulting Physician (Hematology and Oncology) Luanne Bras, MD as Consulting Physician (Interventional Radiology)  SUMMARY OF ONCOLOGIC HISTORY: Oncology History   The     Multiple myeloma in relapse [facility])   11/12/2008 Imaging    Skeletal survey showed lytic lesion in the right femur compatible with  myeloma. There were Questionable skull lesions      11/15/2008 Bone Marrow Biopsy    BONE MARROW ASPIRATE AND BIOPSY: showed NORMOCELLULAR MARROW FOR AGE WITH PLASMA CELL DYSCRASIA  (PLASMA CELLS 25%). 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 Subsequently she was placed on Revlimid & Dexamethasone alone      05/12/2013 Bone Marrow Biopsy    Bone marrow biopsy showed persistent plasma cells. Blood work confirmed VGPR status      06/11/2014 Tumor Marker    Bloodwork show disease relapse      06/26/2014 Procedure    She has placement of port      07/05/2014 - 07/26/2014 Chemotherapy    She received Elotuzumab and Revlimid. Rx was discontinued with elotuzumab per patient preference      08/01/2014 - 08/05/2014 Hospital Admission    She was admitted to the hospital for kyphoplasty and pain management after traumatic compression fracture      08/23/2014 - 04/18/2015 Chemotherapy    She was placed on dexamethasone & Revlimid      04/11/2015 Tumor Marker    Blood work showed VGPR. Treatment was discontinued and she is maintained on Zometa only      10/20/2015 Imaging    MRI spine showed acute/subacute superior endplate compression fractures at T2 and T9 are incompletely healed.        11/05/2015 Procedure    Status post vertebral body augmentation using balloon kyphoplasty at T9 as described without event      12/27/2015 Procedure    She had port placement      01/02/2016 -  Chemotherapy    She received Daratumumab, Dexamethasone and Velcade. Velcade was discontinued after 01/30/16 after less than optimum response is noted and her Rx is switched to Daratumumab, dexamethasone and Pomalidomide      01/20/2016 Imaging    MRi spine showed acute to subacute pathologic fractures of T10 and T11. Up to 70% loss of vertebral body height at the former with mild retropulsion of bone contributing to mild spinal stenosis at the T10-T11 level.      03/16/2016 - 03/17/2016 Hospital Admission    She was admitted to the hospital because of shortness of breath      03/20/2016 Procedure    Status post vertebral body augmentation for painful compression fractures at T10 and T11 using vertebroplasty technique.       03/24/2016 Imaging    CT angiogram showed pulmonary embolus within segmental branches to the right lower lung lobe, and within subsegmental branches to the left upper lobe. Small right and trace left pleural effusions, with underlying interstitial prominence, concerning for mild interstitial edema. She and and atenolol was sitting at the [facility] this is okay to stop diffuse coronary artery calcifications seen. Mild biatrial enlargement noted. Scarring at the lung apices, with associated calcification.  03/24/2016 - 03/29/2016 Hospital Admission    The patient was admitted due to shortness of breath and was found to have pulmonary emboli       INTERVAL HISTORY: Please see below for problem oriented charting. She returns today for further follow-up. She was recently brought to the emergency department due to feeling jittery, anxious and sensation that she could not breathe. Evaluation in the emergency department reveals no major medical issues but the patient appears to  respond well to lorazepam and was subsequently discharged. She felt that her back pain is under control now. The patient felt hopeless due to loss of function, fear of falls, lack of independence and inability to perform all activities of daily living without help. She denies suicidal ideation. She sleeps well. Her daughter noted that the patient have increased mild sedation recently with increasing doses of Xanax  REVIEW OF SYSTEMS:   Constitutional: Denies fevers, chills or abnormal weight loss Eyes: Denies blurriness of vision Ears, nose, mouth, throat, and face: Denies mucositis or sore throat Respiratory: Denies cough, dyspnea or wheezes Cardiovascular: Denies palpitation, chest discomfort or lower extremity swelling Gastrointestinal:  Denies nausea, heartburn or change in bowel habits Skin: Denies abnormal skin rashes Lymphatics: Denies new lymphadenopathy or easy bruising Neurological:Denies numbness, tingling or new weaknesses All other systems were reviewed with the patient and are negative.  I have reviewed the past medical history, past surgical history, social history and family history with the patient and they are unchanged from previous note.  ALLERGIES:  is allergic to no known allergies.  MEDICATIONS:  Current Outpatient Prescriptions  Medication Sig Dispense Refill  . ALPRAZolam (XANAX) 0.25 MG tablet Take 1 tablet (0.25 mg total) by mouth 3 (three) times daily as needed for anxiety. 90 tablet 0  . apixaban (ELIQUIS) 5 MG TABS tablet Take 1 tablet (5 mg total) by mouth 2 (two) times daily. apixaban take 10 mg two times daily for 4 days then take apixaban  5 mg twice daily 60 tablet 2  . Artificial Tear Ointment (ARTIFICIAL TEARS) ointment Apply 1 drop to eye as needed (dry eyes).     . cholecalciferol (VITAMIN D) 1000 UNITS tablet Take 1,000 Units by mouth daily.    [name] dexamethasone (DECADRON) 4 MG tablet Take 2 mg by mouth every other day.     . folic acid (FOLVITE) 1  MG tablet Take 1 tablet (1 mg total) by mouth daily. 30 tablet 0  . furosemide (LASIX) 20 MG tablet Take 1 tablet daily as needed for swelling (Patient taking differently: Take 20 mg by mouth daily as needed for fluid. Take 1 tablet daily as needed for swelling) 30 tablet 3  . gabapentin (NEURONTIN) 300 MG capsule Take 300 mg by mouth 3 (three) times daily.    [name] levothyroxine (SYNTHROID, LEVOTHROID) 50 MCG tablet Take 1 tablet (50 mcg total) by mouth daily before breakfast. 30 tablet 9  . LORazepam (ATIVAN) 1 MG tablet Take 1 tablet (1 mg total) by mouth every 6 (six) hours as needed for anxiety. 15 tablet 0  . morphine (MS CONTIN) 60 MG 12 hr tablet Take 1 tablet (60 mg total) by mouth every 12 (twelve) hours. 60 tablet 0  . Multiple Vitamin (MULTIVITAMIN WITH MINERALS) TABS tablet Take 1 tablet by mouth daily.    [name] oxycodone (ROXICODONE) 30 MG immediate release tablet Take 1 tablet (30 mg total) by mouth every 4 (four) hours as needed. (Patient taking differently: Take 30 mg by mouth every 4 (  four) hours as needed for pain. ) 90 tablet 0  . polyethylene glycol (MIRALAX / GLYCOLAX) packet Take 17 g by mouth daily as needed for mild constipation. 14 each 0  . Polyvinyl Alcohol-Povidone (REFRESH OP) Place 2 drops into both eyes 2 (two) times daily as needed (dry eyes).    . temazepam (RESTORIL) 15 MG capsule Take 1 capsule (15 mg total) by mouth at bedtime as needed for sleep. 30 capsule 0  . valsartan (DIOVAN) 160 MG tablet Take 1 tablet (160 mg total) by mouth daily. 30 tablet 3  . vitamin B-12 1000 MCG tablet Take 1 tablet (1,000 mcg total) by mouth daily. 30 tablet 0  . dexamethasone (DECADRON) 1 MG tablet Take 1 tablet (1 mg total) by mouth daily. 30 tablet 0  . LORazepam (ATIVAN) 1 MG tablet Take 1 tablet (1 mg total) by mouth every 8 (eight) hours as needed for anxiety. 90 tablet 0  . nitroGLYCERIN (NITROSTAT) 0.4 MG SL tablet Place 1 tablet (0.4 mg total) under the tongue every 5 (five) minutes  as needed for chest pain. (Patient not taking: Reported on 04/08/2016) 25 tablet 11  . senna-docusate (SENOKOT-S) 8.6-50 MG tablet Take 1 tablet by mouth 2 (two) times daily. (Patient not taking: Reported on 04/08/2016) 30 tablet 0   No current facility-administered medications for this visit.     PHYSICAL EXAMINATION: ECOG PERFORMANCE STATUS: 2 - Symptomatic, <50% confined to bed  Vitals:   04/08/16 1143  BP: (!) 120/57  Pulse: 81  Resp: 17  Temp: 98.6 F (37 C)   Filed Weights   04/08/16 1143  Weight: 112 lb 6.4 oz (51 kg)    GENERAL:alert, no distress and comfortable. She looks thin, tearful and she sits on wheelchair SKIN: skin color, texture, turgor are normal, no rashes or significant lesions EYES: normal, Conjunctiva are pink and non-injected, sclera clear Musculoskeletal:no cyanosis of digits and no clubbing  NEURO: alert & oriented x 3 with fluent speech, no focal motor/sensory deficits  LABORATORY DATA:  I have reviewed the data as listed    Component Value Date/Time   NA 131 (L) 04/05/2016 2129   NA 132 (L) 03/31/2016 1032   K 4.4 04/05/2016 2129   K 4.5 03/31/2016 1032   CL 97 (L) 04/05/2016 2129   CL 103 11/25/2012 1328   CO2 28 04/05/2016 2129   CO2 25 03/31/2016 1032   GLUCOSE 98 04/05/2016 2129   GLUCOSE 120 03/31/2016 1032   GLUCOSE 91 11/25/2012 1328   BUN 22 (H) 04/05/2016 2129   BUN 21.1 03/31/2016 1032   CREATININE 0.68 04/05/2016 2129   CREATININE 0.7 03/31/2016 1032   CALCIUM 8.4 (L) 04/05/2016 2129   CALCIUM 8.8 03/31/2016 1032   PROT 6.3 03/31/2016 1032   PROT 6.9 03/31/2016 1032   ALBUMIN 2.8 (L) 03/31/2016 1032   AST 26 03/31/2016 1032   ALT 26 03/31/2016 1032   ALKPHOS 94 03/31/2016 1032   BILITOT 0.46 03/31/2016 1032   GFRNONAA >60 04/05/2016 2129   GFRAA >60 04/05/2016 2129    No results found for: SPEP, UPEP  Lab Results  Component Value Date   WBC 6.1 04/05/2016   NEUTROABS 4.8 04/05/2016   HGB 9.6 (L) 04/05/2016   HCT  28.4 (L) 04/05/2016   MCV 102.5 (H) 04/05/2016   PLT 337 04/05/2016      Chemistry      Component Value Date/Time   NA 131 (L) 04/05/2016 2129   NA 132 (L) 03/31/2016  1032   K 4.4 04/05/2016 2129   K 4.5 03/31/2016 1032   CL 97 (L) 04/05/2016 2129   CL 103 11/25/2012 1328   CO2 28 04/05/2016 2129   CO2 25 03/31/2016 1032   BUN 22 (H) 04/05/2016 2129   BUN 21.1 03/31/2016 1032   CREATININE 0.68 04/05/2016 2129   CREATININE 0.7 03/31/2016 1032      Component Value Date/Time   CALCIUM 8.4 (L) 04/05/2016 2129   CALCIUM 8.8 03/31/2016 1032   ALKPHOS 94 03/31/2016 1032   AST 26 03/31/2016 1032   ALT 26 03/31/2016 1032   BILITOT 0.46 03/31/2016 1032     ASSESSMENT & PLAN:  Multiple myeloma in relapse (HCC) Her recent myeloma panel show positive response to treatment. I will see her again next week for further supportive care I plan to reduce the dose of dexamethasone to 1 mg every other day with further dose reduction in her next visit  Anxiety attack She has recurrent anxiety attacks with feeling jittery, depressed and sense of hopelessness because of loss of function, independence and other activities that she used to enjoy prior to feeling unwell related to recent myeloma relapse The patient is tearful. According to her daughter, the patient has been asking for more frequent than 3 times a day benzodiazepines. The patient appears to feel better while on lorazepam. I have discontinue Xanax and switch her to lorazepam. We also discussed starting her on mood stabilizers/antidepressants but the patient declined. We also discussed about potential referral to psychologist, psychiatrist or to talk to a pastor but the patient also declined. We discussed potential referral to palliative care consult and she declines. With changes of medications above, I will reassess next week  Physical deconditioning She has significant physical deconditioning and muscle weakness. We discussed  referral to home physical therapy and she agreed I plan to reduce the dose of steroids further to reduce the risk of steroid myopathy  Goals of care, counseling/discussion We have discussed goals of care before. The patient is very frail with poor tolerance to treatment. I have attempted to discuss possibility of discontinuation of chemotherapy but the patient wants to continue treatment. She wants to stay alive to take care of her husband who is 44 years old and frail. Her daughter brought a copy of the advanced directives. Both her daughters are dedicated medical power of attorney. We discussed CODE STATUS and the patient desires full code We also discussed other life-sustaining measures such as hemodialysis, blood transfusion support or artificial feeding and the patient wants to continue all aggressive measures.    No orders of the defined types were placed in this encounter.  All questions were answered. The patient knows to call the clinic with any problems, questions or concerns. No barriers to learning was detected. I spent 55 minutes counseling the patient face to face. The total time spent in the appointment was 60 minutes and more than 50% was on counseling and review of test results     Heath Lark, MD 04/08/2016 1:22 PM

## 2016-04-08 NOTE — Assessment & Plan Note (Signed)
She has significant physical deconditioning and muscle weakness. We discussed referral to home physical therapy and she agreed I plan to reduce the dose of steroids further to reduce the risk of steroid myopathy

## 2016-04-08 NOTE — Assessment & Plan Note (Signed)
We have discussed goals of care before. The patient is very frail with poor tolerance to treatment. I have attempted to discuss possibility of discontinuation of chemotherapy but the patient wants to continue treatment. She wants to stay alive to take care of her husband who is 80 years old and frail. Her daughter brought a copy of the advanced directives. Both her daughters are dedicated medical power of attorney. We discussed CODE STATUS and the patient desires full code We also discussed other life-sustaining measures such as hemodialysis, blood transfusion support or artificial feeding and the patient wants to continue all aggressive measures.

## 2016-04-09 ENCOUNTER — Telehealth: Payer: Self-pay | Admitting: *Deleted

## 2016-04-09 NOTE — Telephone Encounter (Signed)
Daughter Called to clarify medication orders for pt.  States she thought Dr. Alvy Bimler told her to take Dex every other day but bottle says daily.   Also thought Ativan was three times daily w/ meals, but bottle says every 8 hrs.  She also reports pt seems over sedated and slurring her words on the amt of ativan she is taking.  Clarified w/ April that Dex is every other day.   And Ativan is three times a day w/ meals.  Suggested cutting ativan down to 1/2 tablet since pt is so sedated and slurring her words.   Can take the other 1/2 tablet if she starts feeling anxious.  Let us know how this works for pt.[name]  April agreed and and will call back tomorrow.

## 2016-04-10 ENCOUNTER — Telehealth: Payer: Self-pay | Admitting: Hematology and Oncology

## 2016-04-10 NOTE — Telephone Encounter (Signed)
SPOKE WITH PATIENT RE 10/13 APPOINTMENTS

## 2016-04-12 ENCOUNTER — Other Ambulatory Visit: Payer: Self-pay | Admitting: Hematology and Oncology

## 2016-04-13 ENCOUNTER — Other Ambulatory Visit: Payer: Self-pay | Admitting: Hematology and Oncology

## 2016-04-13 ENCOUNTER — Telehealth: Payer: Self-pay | Admitting: *Deleted

## 2016-04-13 ENCOUNTER — Encounter (HOSPITAL_BASED_OUTPATIENT_CLINIC_OR_DEPARTMENT_OTHER): Payer: Medicare Other | Admitting: Hematology and Oncology

## 2016-04-13 ENCOUNTER — Ambulatory Visit: Payer: Medicare Other | Admitting: Nurse Practitioner

## 2016-04-13 ENCOUNTER — Inpatient Hospital Stay (HOSPITAL_COMMUNITY)
Admission: AD | Admit: 2016-04-13 | Discharge: 2016-04-17 | DRG: 947 | Disposition: A | Discharge status: Hospice | Payer: Medicare Other | Source: Ambulatory Visit | Attending: Hematology and Oncology | Admitting: Hematology and Oncology

## 2016-04-13 VITALS — BP 138/67 | HR 93 | Temp 98.5°F | Resp 18

## 2016-04-13 DIAGNOSIS — Z79891 Long term (current) use of opiate analgesic: Secondary | ICD-10-CM

## 2016-04-13 DIAGNOSIS — M549 Dorsalgia, unspecified: Secondary | ICD-10-CM

## 2016-04-13 DIAGNOSIS — Z9221 Personal history of antineoplastic chemotherapy: Secondary | ICD-10-CM

## 2016-04-13 DIAGNOSIS — Z853 Personal history of malignant neoplasm of breast: Secondary | ICD-10-CM

## 2016-04-13 DIAGNOSIS — F418 Other specified anxiety disorders: Secondary | ICD-10-CM | POA: Diagnosis not present

## 2016-04-13 DIAGNOSIS — C9000 Multiple myeloma not having achieved remission: Secondary | ICD-10-CM | POA: Diagnosis present

## 2016-04-13 DIAGNOSIS — C9002 Multiple myeloma in relapse: Secondary | ICD-10-CM | POA: Diagnosis not present

## 2016-04-13 DIAGNOSIS — Z7901 Long term (current) use of anticoagulants: Secondary | ICD-10-CM | POA: Diagnosis not present

## 2016-04-13 DIAGNOSIS — F410 Panic disorder [episodic paroxysmal anxiety] without agoraphobia: Secondary | ICD-10-CM | POA: Diagnosis present

## 2016-04-13 DIAGNOSIS — G8929 Other chronic pain: Secondary | ICD-10-CM | POA: Diagnosis present

## 2016-04-13 DIAGNOSIS — Z823 Family history of stroke: Secondary | ICD-10-CM | POA: Diagnosis not present

## 2016-04-13 DIAGNOSIS — D72829 Elevated white blood cell count, unspecified: Secondary | ICD-10-CM | POA: Diagnosis not present

## 2016-04-13 DIAGNOSIS — F329 Major depressive disorder, single episode, unspecified: Secondary | ICD-10-CM | POA: Diagnosis present

## 2016-04-13 DIAGNOSIS — E440 Moderate protein-calorie malnutrition: Secondary | ICD-10-CM | POA: Diagnosis present

## 2016-04-13 DIAGNOSIS — I2510 Atherosclerotic heart disease of native coronary artery without angina pectoris: Secondary | ICD-10-CM | POA: Diagnosis present

## 2016-04-13 DIAGNOSIS — E860 Dehydration: Secondary | ICD-10-CM | POA: Diagnosis present

## 2016-04-13 DIAGNOSIS — D61818 Other pancytopenia: Secondary | ICD-10-CM | POA: Diagnosis not present

## 2016-04-13 DIAGNOSIS — M6281 Muscle weakness (generalized): Secondary | ICD-10-CM | POA: Diagnosis present

## 2016-04-13 DIAGNOSIS — E039 Hypothyroidism, unspecified: Secondary | ICD-10-CM | POA: Diagnosis present

## 2016-04-13 DIAGNOSIS — Z79899 Other long term (current) drug therapy: Secondary | ICD-10-CM | POA: Diagnosis not present

## 2016-04-13 DIAGNOSIS — I2699 Other pulmonary embolism without acute cor pulmonale: Secondary | ICD-10-CM

## 2016-04-13 DIAGNOSIS — I429 Cardiomyopathy, unspecified: Secondary | ICD-10-CM | POA: Diagnosis present

## 2016-04-13 DIAGNOSIS — Z7189 Other specified counseling: Secondary | ICD-10-CM | POA: Diagnosis not present

## 2016-04-13 DIAGNOSIS — Z66 Do not resuscitate: Secondary | ICD-10-CM | POA: Diagnosis present

## 2016-04-13 DIAGNOSIS — M4850XD Collapsed vertebra, not elsewhere classified, site unspecified, subsequent encounter for fracture with routine healing: Secondary | ICD-10-CM

## 2016-04-13 DIAGNOSIS — G893 Neoplasm related pain (acute) (chronic): Secondary | ICD-10-CM | POA: Diagnosis present

## 2016-04-13 DIAGNOSIS — I10 Essential (primary) hypertension: Secondary | ICD-10-CM | POA: Diagnosis present

## 2016-04-13 DIAGNOSIS — R262 Difficulty in walking, not elsewhere classified: Secondary | ICD-10-CM

## 2016-04-13 DIAGNOSIS — M4854XD Collapsed vertebra, not elsewhere classified, thoracic region, subsequent encounter for fracture with routine healing: Secondary | ICD-10-CM | POA: Diagnosis present

## 2016-04-13 DIAGNOSIS — Z7952 Long term (current) use of systemic steroids: Secondary | ICD-10-CM | POA: Diagnosis not present

## 2016-04-13 DIAGNOSIS — Z515 Encounter for palliative care: Secondary | ICD-10-CM | POA: Diagnosis present

## 2016-04-13 DIAGNOSIS — Z8249 Family history of ischemic heart disease and other diseases of the circulatory system: Secondary | ICD-10-CM

## 2016-04-13 DIAGNOSIS — I82409 Acute embolism and thrombosis of unspecified deep veins of unspecified lower extremity: Secondary | ICD-10-CM

## 2016-04-13 DIAGNOSIS — F411 Generalized anxiety disorder: Secondary | ICD-10-CM | POA: Diagnosis present

## 2016-04-13 DIAGNOSIS — K2100 Gastro-esophageal reflux disease with esophagitis, without bleeding: Secondary | ICD-10-CM

## 2016-04-13 DIAGNOSIS — F32A Depression, unspecified: Secondary | ICD-10-CM | POA: Diagnosis present

## 2016-04-13 DIAGNOSIS — IMO0001 Reserved for inherently not codable concepts without codable children: Secondary | ICD-10-CM

## 2016-04-13 DIAGNOSIS — K5900 Constipation, unspecified: Secondary | ICD-10-CM

## 2016-04-13 DIAGNOSIS — M4850XA Collapsed vertebra, not elsewhere classified, site unspecified, initial encounter for fracture: Secondary | ICD-10-CM | POA: Diagnosis present

## 2016-04-13 DIAGNOSIS — R5381 Other malaise: Secondary | ICD-10-CM | POA: Diagnosis present

## 2016-04-13 HISTORY — DX: Generalized anxiety disorder: F41.1

## 2016-04-13 MED ORDER — SODIUM CHLORIDE 0.9 % IV SOLN
8.0000 mg | Freq: Three times a day (TID) | INTRAVENOUS | Status: DC | PRN
  Filled 2016-04-13: qty 4

## 2016-04-13 MED ORDER — SENNOSIDES-DOCUSATE SODIUM 8.6-50 MG PO TABS
1.0000 | ORAL_TABLET | Freq: Two times a day (BID) | ORAL | Status: DC
  Administered 2016-04-13 – 2016-04-17 (×7): 1 via ORAL
  Filled 2016-04-13 (×8): qty 1

## 2016-04-13 MED ORDER — SODIUM CHLORIDE 0.9 % IV SOLN
INTRAVENOUS | Status: DC
  Administered 2016-04-13 – 2016-04-16 (×3): via INTRAVENOUS

## 2016-04-13 MED ORDER — LEVOTHYROXINE SODIUM 50 MCG PO TABS
50.0000 ug | ORAL_TABLET | Freq: Every day | ORAL | Status: DC
  Administered 2016-04-14 – 2016-04-17 (×4): 50 ug via ORAL
  Filled 2016-04-13 (×4): qty 1

## 2016-04-13 MED ORDER — TEMAZEPAM 15 MG PO CAPS
15.0000 mg | ORAL_CAPSULE | Freq: Every evening | ORAL | Status: DC | PRN
  Administered 2016-04-13: 15 mg via ORAL
  Filled 2016-04-13: qty 1

## 2016-04-13 MED ORDER — LORAZEPAM 0.5 MG PO TABS
0.2500 mg | ORAL_TABLET | Freq: Three times a day (TID) | ORAL | Status: DC | PRN
  Administered 2016-04-13: 0.25 mg via ORAL
  Filled 2016-04-13: qty 1

## 2016-04-13 MED ORDER — VITAMIN B-12 1000 MCG PO TABS
1000.0000 ug | ORAL_TABLET | Freq: Every day | ORAL | Status: DC
  Administered 2016-04-13 – 2016-04-17 (×4): 1000 ug via ORAL
  Filled 2016-04-13 (×5): qty 1

## 2016-04-13 MED ORDER — HYDROMORPHONE HCL 4 MG/ML IJ SOLN
INTRAMUSCULAR | Status: AC
  Filled 2016-04-13: qty 1

## 2016-04-13 MED ORDER — SENNOSIDES-DOCUSATE SODIUM 8.6-50 MG PO TABS: 1.0000 | ORAL_TABLET | Freq: Every evening | ORAL | Status: DC | PRN

## 2016-04-13 MED ORDER — HEPARIN SOD (PORK) LOCK FLUSH 100 UNIT/ML IV SOLN
500.0000 [IU] | INTRAVENOUS | Status: DC | PRN
  Filled 2016-04-13: qty 5

## 2016-04-13 MED ORDER — FOLIC ACID 1 MG PO TABS
1.0000 mg | ORAL_TABLET | Freq: Every day | ORAL | Status: DC
  Administered 2016-04-13 – 2016-04-17 (×4): 1 mg via ORAL
  Filled 2016-04-13 (×5): qty 1

## 2016-04-13 MED ORDER — LORAZEPAM 1 MG PO TABS: 1.0000 mg | ORAL_TABLET | Freq: Three times a day (TID) | ORAL | Status: DC | PRN

## 2016-04-13 MED ORDER — ONDANSETRON HCL 4 MG/2ML IJ SOLN: 4.0000 mg | Freq: Three times a day (TID) | INTRAMUSCULAR | Status: DC | PRN

## 2016-04-13 MED ORDER — HYDROMORPHONE HCL 1 MG/ML IJ SOLN
1.0000 mg | INTRAMUSCULAR | Status: DC | PRN
Start: 2016-04-13 — End: 2016-04-17
  Administered 2016-04-13 – 2016-04-15 (×3): 1 mg via INTRAVENOUS
  Filled 2016-04-13 (×3): qty 1

## 2016-04-13 MED ORDER — ACETAMINOPHEN 325 MG PO TABS
650.0000 mg | ORAL_TABLET | ORAL | Status: DC | PRN
Start: 2016-04-13 — End: 2016-04-17

## 2016-04-13 MED ORDER — GABAPENTIN 300 MG PO CAPS: 300.0000 mg | ORAL_CAPSULE | Freq: Three times a day (TID) | ORAL | Status: DC

## 2016-04-13 MED ORDER — MORPHINE SULFATE ER 30 MG PO TBCR
60.0000 mg | EXTENDED_RELEASE_TABLET | Freq: Two times a day (BID) | ORAL | Status: DC
  Administered 2016-04-13 – 2016-04-17 (×8): 60 mg via ORAL
  Filled 2016-04-13 (×9): qty 2

## 2016-04-13 MED ORDER — POLYETHYLENE GLYCOL 3350 17 G PO PACK: 17.0000 g | PACK | Freq: Every day | ORAL | Status: DC | PRN

## 2016-04-13 MED ORDER — DEXAMETHASONE 0.5 MG PO TABS
1.0000 mg | ORAL_TABLET | Freq: Every day | ORAL | Status: DC
  Administered 2016-04-13 – 2016-04-17 (×5): 1 mg via ORAL
  Filled 2016-04-13 (×5): qty 2

## 2016-04-13 MED ORDER — ARTIFICIAL TEARS OP OINT
TOPICAL_OINTMENT | Freq: Every evening | OPHTHALMIC | Status: DC | PRN
  Filled 2016-04-13: qty 3.5

## 2016-04-13 MED ORDER — GABAPENTIN 300 MG PO CAPS
300.0000 mg | ORAL_CAPSULE | Freq: Every day | ORAL | Status: DC
  Administered 2016-04-13 – 2016-04-16 (×4): 300 mg via ORAL
  Filled 2016-04-13 (×4): qty 1

## 2016-04-13 MED ORDER — ADULT MULTIVITAMIN W/MINERALS CH
1.0000 | ORAL_TABLET | Freq: Every day | ORAL | Status: DC
  Administered 2016-04-13 – 2016-04-17 (×4): 1 via ORAL
  Filled 2016-04-13 (×5): qty 1

## 2016-04-13 MED ORDER — REFRESH P.M. OP OINT: 1.0000 [drp] | TOPICAL_OINTMENT | OPHTHALMIC | Status: DC | PRN

## 2016-04-13 MED ORDER — OXYCODONE HCL 5 MG PO TABS
30.0000 mg | ORAL_TABLET | ORAL | Status: DC | PRN
  Administered 2016-04-15 – 2016-04-16 (×2): 30 mg via ORAL
  Filled 2016-04-13 (×2): qty 6

## 2016-04-13 MED ORDER — SODIUM CHLORIDE 0.9 % IJ SOLN
10.0000 mL | INTRAMUSCULAR | Status: AC | PRN
  Administered 2016-04-13: 10 mL
  Filled 2016-04-13: qty 10

## 2016-04-13 MED ORDER — ONDANSETRON 4 MG PO TBDP
4.0000 mg | ORAL_TABLET | Freq: Three times a day (TID) | ORAL | Status: DC | PRN
Start: 2016-04-13 — End: 2016-04-17

## 2016-04-13 MED ORDER — HYDROMORPHONE HCL 4 MG/ML IJ SOLN
1.0000 mg | INTRAMUSCULAR | Status: DC | PRN
Start: 2016-04-13 — End: 2016-04-13
  Administered 2016-04-13: 1 mg via INTRAVENOUS

## 2016-04-13 MED ORDER — VITAMIN D3 25 MCG (1000 UNIT) PO TABS
1000.0000 [IU] | ORAL_TABLET | Freq: Every day | ORAL | Status: DC
  Administered 2016-04-13 – 2016-04-17 (×4): 1000 [IU] via ORAL
  Filled 2016-04-13 (×5): qty 1

## 2016-04-13 MED ORDER — APIXABAN 5 MG PO TABS
5.0000 mg | ORAL_TABLET | Freq: Two times a day (BID) | ORAL | Status: DC
  Administered 2016-04-13 – 2016-04-17 (×8): 5 mg via ORAL
  Filled 2016-04-13 (×8): qty 1

## 2016-04-13 MED ORDER — LORAZEPAM 1 MG PO TABS
1.0000 mg | ORAL_TABLET | Freq: Four times a day (QID) | ORAL | Status: DC | PRN
  Administered 2016-04-13: 1 mg via ORAL
  Filled 2016-04-13: qty 1

## 2016-04-13 MED ORDER — ONDANSETRON HCL 4 MG PO TABS: 4.0000 mg | ORAL_TABLET | Freq: Three times a day (TID) | ORAL | Status: DC | PRN

## 2016-04-13 MED ORDER — NITROGLYCERIN 0.4 MG SL SUBL: 0.4000 mg | SUBLINGUAL_TABLET | SUBLINGUAL | Status: DC | PRN

## 2016-04-13 NOTE — Telephone Encounter (Signed)
Daughter called concerned about pt's condition.  State Anxiety still very difficult to manage because she feels like pt is a "Zombie" and confused when they give her the ativan as prescribed w/ meals.  They have tried to decrease the dose, but then pt is having periods of severe anxiety and restlessness.  Pt has hardly eaten or drank this weekend.  She is unsure if her pain is managed well or it is mostly anxiety.   Dr. Alvy Bimler will see pt today at 12:30 pm.  Instructed April to bring pt at noon.  She agreed.

## 2016-04-13 NOTE — Progress Notes (Signed)
Palliative Medicine consult noted. Due to high referral volume, there may be a delay seeing this patient. Please call the Palliative Medicine Team office at [phone] if recommendations are needed in the interim.  Thank you for inviting [place] to see this patient.  Marjie Skiff Champion Corales, RN, BSN, [facility] 04/13/2016 2:38 PM Cell [phone] 8:00-4:00 Monday-Friday Office [phone]

## 2016-04-13 NOTE — H&P (Signed)
[place] ADMISSION NOTE  Patient Care Team: Thressa Sheller, MD as PCP - General (Internal Medicine) Thressa Sheller, MD (Internal Medicine) Renella Cunas, MD (Inactive) as Attending Physician (Cardiology) Heath Lark, MD as Consulting Physician (Hematology and Oncology) Luanne Bras, MD as Consulting Physician (Interventional Radiology)  CHIEF COMPLAINTS/PURPOSE OF ADMISSION Severe, uncontrolled pain with depression and anxiety, on background history of multiple myeloma  HISTORY OF PRESENTING ILLNESS:  Natasha Jackson 82 y.o. female is admitted for symptom management Summary of oncologic history as follows: Oncology History   The     Multiple myeloma in relapse [facility])   11/12/2008 Imaging    Skeletal survey showed lytic lesion in the right femur compatible with  myeloma. There were Questionable skull lesions      11/15/2008 Bone Marrow Biopsy    BONE MARROW ASPIRATE AND BIOPSY: showed NORMOCELLULAR MARROW FOR AGE WITH PLASMA CELL DYSCRASIA  (PLASMA CELLS 25%). 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 Subsequently she was placed on Revlimid & Dexamethasone alone      05/12/2013 Bone Marrow Biopsy    Bone marrow biopsy showed persistent plasma cells. Blood work confirmed VGPR status      06/11/2014 Tumor Marker    Bloodwork show disease relapse      06/26/2014 Procedure    She has placement of port      07/05/2014 - 07/26/2014 Chemotherapy    She received Elotuzumab and Revlimid. Rx was discontinued with elotuzumab per patient preference      08/01/2014 - 08/05/2014 Hospital Admission    She was admitted to the hospital for kyphoplasty and pain management after traumatic compression fracture      08/23/2014 - 04/18/2015 Chemotherapy    She was placed on dexamethasone & Revlimid      04/11/2015  Tumor Marker    Blood work showed VGPR. Treatment was discontinued and she is maintained on Zometa only      10/20/2015 Imaging    MRI spine showed acute/subacute superior endplate compression fractures at T2 and T9 are incompletely healed.       11/05/2015 Procedure    Status post vertebral body augmentation using balloon kyphoplasty at T9 as described without event      12/27/2015 Procedure    She had port placement      01/02/2016 -  Chemotherapy    She received Daratumumab, Dexamethasone and Velcade. Velcade was discontinued after 01/30/16 after less than optimum response is noted and her Rx is switched to Daratumumab, dexamethasone and Pomalidomide      01/20/2016 Imaging    MRi spine showed acute to subacute pathologic fractures of T10 and T11. Up to 70% loss of vertebral body height at the former with mild retropulsion of bone contributing to mild spinal stenosis at the T10-T11 level.      03/16/2016 - 03/17/2016 Hospital Admission    She was admitted to the hospital because of shortness of breath      03/20/2016 Procedure    Status post vertebral body augmentation for painful compression fractures at T10 and T11 using vertebroplasty technique.       03/24/2016 Imaging    CT angiogram showed pulmonary embolus within segmental branches to the right lower lung lobe, and within subsegmental branches to the left upper lobe. Small right and trace left pleural effusions, with underlying interstitial prominence, concerning for  mild interstitial edema. She and and atenolol was sitting at the IMA this is okay to stop diffuse coronary artery calcifications seen. Mild biatrial enlargement noted. Scarring at the lung apices, with associated calcification.      03/24/2016 - 03/29/2016 Hospital Admission    The patient was admitted due to shortness of breath and was found to have pulmonary emboli      I have seen the patient in my office today. The patient has been seen almost on a weekly  basis for the past month for symptom management. She has background history of multiple myeloma. She has severe, uncontrolled back pain since I saw her last week. Today, she felt that her pain is 8 out of 10 and is "all over". She was not able to get out of bed because of this. According to her daughter, the patient appears to be taking extended release morphine consistently except for possibly 1 - 2 nights last week when she thought her mother did not take and 2-3 times per day breakthrough oxycodone. The increased pain level coincides with the dexamethasone taper. Despite significant reduction in dexamethasone dose, the patient continues to have significant symptoms of anxiety and jittery and unable to function. When I switch her medication from Xanax to lorazepam, her anxiety is better controlled but then the patient is noted to be more sedated. She has a very poor oral intake over the past few days and has lost 3 pounds since I saw her. The daughter is concerned about safety. The patient lives with her elderly husband with dementia. There were no reported recent falls at home but she is not consistent with taking her medications She denies constipation. Denies nausea. Due to symptoms of uncontrolled pain, anxiety, depression with poor oral intake and significant decline in performance status, the patient and family member agreed to be admitted for symptom management and failed outpatient therapy  MEDICAL HISTORY:  Past Medical History:  Diagnosis Date  . Aortic regurgitation   . Breast cancer ([place])   . Coronary artery disease   . DCIS (ductal carcinoma in situ) 08/04/2012  . Depression 09/05/2014  . Dysphagia 01/23/2016  . Hot flash not due to menopause 12/23/2011  . HTN (hypertension)   . Hypothyroidism 10/18/2014  . Multiple myeloma   . Multiple myeloma ([place]) 10/11/2015  . Neuropathy ([place]) 12/23/2011  . Spinal fracture of T12 vertebra ([facility])    vertebral fx    SURGICAL HISTORY: Past  Surgical History:  Procedure Laterality Date  . INCISIONAL HERNIA REPAIR    . IR GENERIC HISTORICAL  03/20/2016   IR VERTEBROPLASTY EA ADDL (T&LS) BX INC UNI/BIL INC INJECT/IMAGING 03/20/2016 Luanne Bras, MD [facility]-INTERV RAD  . IR GENERIC HISTORICAL  03/20/2016   IR VERTEBROPLASTY CERV/THOR BX INC UNI/BIL INC/INJECT/IMAGING 03/20/2016 Luanne Bras, MD [facility]INTER[facility]  . lymph node excision - left groin      SOCIAL HISTORY: Social History   Social History  . Marital status: Married    Spouse name: N/A  . Number of children: N/A  . Years of education: N/A   Occupational History  . Not on file.   Social History Main Topics  . Smoking status: Never Smoker  . Smokeless tobacco: Never Used  . Alcohol use No  . Drug use: No  . Sexual activity: No   Other Topics Concern  . Not on file   Social History Narrative  . No narrative on file    FAMILY HISTORY: Family History  Problem Relation Age  of Onset  . Stroke Father   . Heart failure Mother     ALLERGIES:  is allergic to no known allergies.  MEDICATIONS:  Current Facility-Administered Medications  Medication Dose Route Frequency Provider Last Rate Last Dose  . 0.9 %  sodium chloride infusion   Intravenous Continuous Heath Lark, MD      . acetaminophen (TYLENOL) tablet 650 mg  650 mg Oral Q4H PRN Heath Lark, MD      . apixaban (ELIQUIS) tablet 5 mg  5 mg Oral BID Heath Lark, MD      . artificial tears ointment 1 drop  1 drop Ophthalmic PRN Heath Lark, MD      . cholecalciferol (VITAMIN D) tablet 1,000 Units  1,000 Units Oral Daily Heath Lark, MD      . dexamethasone (DECADRON) tablet 1 mg  1 mg Oral Daily Heath Lark, MD      . folic acid (FOLVITE) tablet 1 mg  1 mg Oral Daily Heath Lark, MD      . gabapentin (NEURONTIN) capsule 300 mg  300 mg Oral TID Heath Lark, MD      . Derrill Memo ON 04/14/2016] levothyroxine (SYNTHROID, LEVOTHROID) tablet 50 mcg  50 mcg Oral QAC breakfast Heath Lark, MD      . LORazepam (ATIVAN)  tablet 1 mg  1 mg Oral Q8H PRN Heath Lark, MD      . morphine (MS CONTIN) 12 hr tablet 60 mg  60 mg Oral Q12H Heath Lark, MD      . multivitamin with minerals tablet 1 tablet  1 tablet Oral Daily Heath Lark, MD      . nitroGLYCERIN (NITROSTAT) SL tablet 0.4 mg  0.4 mg Sublingual Q5 min PRN Heath Lark, MD      . ondansetron (ZOFRAN) tablet 4-8 mg  4-8 mg Oral Q8H PRN Heath Lark, MD       Or  . ondansetron (ZOFRAN-ODT) disintegrating tablet 4-8 mg  4-8 mg Oral Q8H PRN Heath Lark, MD       Or  . ondansetron (ZOFRAN) injection 4 mg  4 mg Intravenous Q8H PRN Heath Lark, MD       Or  . ondansetron (ZOFRAN) 8 mg in sodium chloride 0.9 % 50 mL IVPB  8 mg Intravenous Q8H PRN Jamar Casagrande, MD      . oxyCODONE (Oxy IR/ROXICODONE) immediate release tablet 30 mg  30 mg Oral Q4H PRN Princella Jaskiewicz, MD      . polyethylene glycol (MIRALAX / GLYCOLAX) packet 17 g  17 g Oral Daily PRN Heath Lark, MD      . senna-docusate (Senokot-S) tablet 1 tablet  1 tablet Oral BID Heath Lark, MD      . temazepam (RESTORIL) capsule 15 mg  15 mg Oral QHS PRN Heath Lark, MD      . vitamin B-12 (CYANOCOBALAMIN) tablet 1,000 mcg  1,000 mcg Oral Daily Heath Lark, MD        REVIEW OF SYSTEMS:   Constitutional: Denies fevers, chills or abnormal night sweats Eyes: Denies blurriness of vision, double vision or watery eyes Ears, nose, mouth, throat, and face: Denies mucositis or sore throat Respiratory: Denies cough, dyspnea or wheezes Cardiovascular: Denies palpitation, chest discomfort or lower extremity swelling Gastrointestinal:  Denies nausea, heartburn or change in bowel habits Skin: Denies abnormal skin rashes Lymphatics: Denies new lymphadenopathy or easy bruising Neurological:Denies numbness, tingling or new weaknesses All other systems were reviewed with the patient and are negative.  PHYSICAL EXAMINATION:  ECOG PERFORMANCE STATUS: 3 - Symptomatic, >50% confined to bed BP 138-67 Hr 93 RR 18 Temperature 98.5 Saturation  95% GENERAL:alert, in moderate distress and appears to be shaking. She is thin and cachectic SKIN: skin color, texture, turgor are normal, no rashes or significant lesions EYES: normal, conjunctiva are pink and non-injected, sclera clear OROPHARYNX:no exudate, no erythema and lips, buccal mucosa, and tongue normal  NECK: supple, thyroid normal size, non-tender, without nodularity LYMPH:  no palpable lymphadenopathy in the cervical, axillary or inguinal LUNGS: clear to auscultation and percussion with normal breathing effort HEART: regular rate & rhythm and no murmurs and no lower extremity edema ABDOMEN:abdomen soft, non-tender and normal bowel sounds Musculoskeletal:no cyanosis of digits and no clubbing  PSYCH: alert & oriented x 3 with fluent speech. She appears depressed NEURO: no focal motor/sensory deficits  LABORATORY DATA:  I have reviewed the data as listed Lab Results  Component Value Date   WBC 6.1 04/05/2016   HGB 9.6 (L) 04/05/2016   HCT 28.4 (L) 04/05/2016   MCV 102.5 (H) 04/05/2016   PLT 337 04/05/2016    Recent Labs  03/17/16 0157  03/25/16 0530  03/27/16 0415 03/28/16 0450 03/31/16 1032 03/31/16 1032 04/05/16 2129  NA 129*  < > 131*  < > 133* 132* 132*  --  131*  K 4.2  < > 4.1  < > 4.8 4.6 4.5  --  4.4  CL 95*  < > 95*  < > 98* 98*  --   --  97*  CO2 27  < > 28  < > _0 --  28  GLUCOSE 128*  < > 93  < > 101* 101* 120  --  98  BUN 14  < > 23*  < > 16 13 21.1  --  22*  CREATININE 0.65  < > 0.55  < > 0.66 0.51 0.7  --  0.68  CALCIUM 7.9*  < > 8.1*  < > 8.4* 8.1* 8.8  --  8.4*  GFRNONAA >60  < > >60  < > >60 >60  --   --  >60  GFRAA >60  < > >60  < > >60 >60  --   --  >60  PROT 5.5*  --  5.7*  --   --   --  6.9 6.3  --   ALBUMIN 2.5*  --  2.8*  --   --   --  2.8*  --   --   AST 19  --  21  --   --   --  26  --   --   ALT 18  --  19  --   --   --  26  --   --   ALKPHOS 56  --  69  --   --   --  94  --   --   BILITOT 0.6  --  0.5  --   --   --  0.46   --   --   < > = values in this interval not displayed.  RADIOGRAPHIC STUDIES: I have personally reviewed the radiological images as listed and agreed with the findings in the report. Dg Chest 2 View  Result Date: 04/05/2016 CLINICAL DATA:  Shortness of breath. EXAM: CHEST  2 VIEW COMPARISON:  Radiographs of March 24, 2016. FINDINGS: Stable cardiomediastinal silhouette. Atherosclerosis of thoracic aorta is noted. Stable position of right-sided Port-A-Cath. No pneumothorax is noted. Status post kyphoplasty  at 4 levels of lower thoracic spine. Mild bibasilar subsegmental atelectasis is noted with minimal pleural effusions. IMPRESSION: Aortic atherosclerosis. Mild bibasilar subsegmental atelectasis with minimal associated pleural effusions. Electronically Signed   By: Marijo Conception, M.D.   On: 04/05/2016 21:59   Dg Chest 2 View  Result Date: 03/24/2016 CLINICAL DATA:  Shortness of breath and back pain since this morning. Previous hospitalization 1 week ago with a cardiac event. EXAM: CHEST  2 VIEW COMPARISON:  03/16/2016 FINDINGS: Cardiac enlargement. Pulmonary vascularity is decreasing since previous study. No focal consolidation or airspace disease. Slight blunting of costophrenic angles may indicate small pleural effusions. Fibrosis in the lung apices. Apical pleural thickening bilaterally. Tortuous common dilated, and calcified aorta. Power port type central venous catheter with tip over right atrium. Multiple thoracic vertebral compression deformities post kyphoplasty. IMPRESSION: Cardiac enlargement. Small bilateral pleural effusions. No significant vascular congestion or edema. Electronically Signed   By: Lucienne Capers M.D.   On: 03/24/2016 21:15   Ct Angio Chest Pe W And/or Wo Contrast  Result Date: 03/24/2016 CLINICAL DATA:  Acute onset of shortness of breath. Recent back surgery. Initial encounter. EXAM: CT ANGIOGRAPHY CHEST WITH CONTRAST TECHNIQUE: Multidetector CT imaging of the chest  was performed using the standard protocol during bolus administration of intravenous contrast. Multiplanar CT image reconstructions and MIPs were obtained to evaluate the vascular anatomy. CONTRAST:  71.4 mL of Isovue 370 IV contrast COMPARISON:  Chest radiograph performed earlier today at 8:57 p.m., and CTA of the chest performed 08/03/2014 FINDINGS: Cardiovascular: Pulmonary embolus is noted within segmental branches to the right lower lobe, and within subsegmental branches to the left upper lobe. The RV/LV ratio is borderline normal, though this is difficult to assess given the patient's pectus excavatum. Diffuse coronary artery calcifications are seen. Mild biatrial enlargement is suggested. Mild calcification is noted along the aortic arch and descending thoracic aorta. The great vessels are grossly unremarkable in appearance. Mediastinum/Nodes: The patient's right-sided chest port is noted ending about the right atrium. No mediastinal lymphadenopathy is seen. No significant pericardial effusion is identified. The thyroid gland is diminutive, with a prominent right-sided calcification. No axillary lymphadenopathy is seen. Lungs/Pleura: Small right and trace left pleural effusions are noted, with bibasilar atelectasis or scarring. Underlying interstitial prominence raises concern for mild interstitial edema. No pneumothorax is seen. No masses are identified. Scarring is noted at the lung apices, with associated calcification. Upper Abdomen: The visualized portions of the liver and spleen are grossly unremarkable. Reflux of contrast into the IVC is incidentally seen. Musculoskeletal: No acute osseous abnormalities are identified. There is mild chronic compression deformity of vertebral body T6, and changes of vertebroplasty are noted at T9 through T12. The visualized musculature is unremarkable in appearance. Review of the MIP images confirms the above findings. IMPRESSION: 1. Pulmonary embolus within segmental  branches to the right lower lung lobe, and within subsegmental branches to the left upper lobe. 2. Small right and trace left pleural effusions, with underlying interstitial prominence, concerning for mild interstitial edema. 3. Diffuse coronary artery calcifications seen. Mild biatrial enlargement noted. 4. Scarring at the lung apices, with associated calcification. 5. Mild chronic compression deformity of vertebral body T6, and changes of vertebroplasty at T9 through T12. Critical Value/emergent results were called by telephone at the time of interpretation on 03/24/2016 at 10:59 pm to Dr. Shary Decamp, who verbally acknowledged these results. Electronically Signed   By: Garald Balding M.D.   On: 03/24/2016 23:00   Dg Chest Portable  1 View  Result Date: 03/16/2016 CLINICAL DATA:  Chest pain since last night.  Bone cancer on EXAM: PORTABLE CHEST 1 VIEW COMPARISON:  12/16/2015 metastatic survey FINDINGS: Cardiopericardial enlargement and vascular pedicle widening. Hazy appearance at the bases may reflect small effusions. Pulmonary venous congestion. No convincing pneumonia. Porta catheter on the right with tip at the right atrium. IMPRESSION: Cardiomegaly and pulmonary venous congestion. Electronically Signed   By: Monte Fantasia M.D.   On: 03/16/2016 10:42   Ir Vertebroplasty Cerv/thor Bx Inc Uni/bil Inc/inject/imaging  Result Date: 03/23/2016 INDICATION: Severe thoracic pain secondary to compression fractures at T10 and T11. EXAM: IR VERTEBROPLASTY CERVICOTHORACIC INJ; IR VERTEBROPLASTY ADDL INJECTION MEDICATIONS: As antibiotic prophylaxis, 2 g Ancef IV was ordered pre-procedure and administered intravenously within 1 hour of incision. ANESTHESIA/SEDATION: Moderate (conscious) sedation was employed during this procedure. A total of Versed 2 mg and Fentanyl 75 mcg was administered intravenously. Moderate Sedation Time: 25 minutes. The patient's level of consciousness and vital signs were monitored  continuously by radiology nursing throughout the procedure under my direct supervision. FLUOROSCOPY TIME:  Fluoroscopy Time: 30 minutes 30 seconds (381 mGy) COMPLICATIONS: None immediate. TECHNIQUE: Informed written consent was obtained from the patient after a thorough discussion of the procedural risks, benefits and alternatives. All questions were addressed. Maximal Sterile Barrier Technique was utilized including caps, mask, sterile gowns, sterile gloves, sterile drape, hand hygiene and skin antiseptic. A timeout was performed prior to the initiation of the procedure. PROCEDURE: The patient was placed prone on the fluoroscopic table. Nasal oxygen was administered. Physiologic monitoring was performed throughout the duration of the procedure. The skin overlying the region was prepped and draped in the usual sterile fashion. The T10 and T11 vertebral bodies were identified and the right pedicle at T11, and left pedicle at T10 were then infiltrated with 0.25% bupivacaine. This was then followed by the advancement of a 13-gauge Cook needles through the respective pediclesinto the anterior one-third at T10 and T11. A gentle contrast injection demonstrated a trabecular pattern of contrast with early opacification of paraspinous venous structures. This necessitated the use of Gel-Foam pledgets to embolize the venous channels prior to the delivery of the methylmethacrylate mixture at both levels. At this time, methylmethacrylate mixture was reconstituted. Under biplane intermittent fluoroscopy, the methylmethacrylate was then injected into the T10 and T11 vertebral bodies with filling of the vertebral bodies. No extravasation was noted into the disk spaces or posteriorly into the spinal canal. No epidural venous contamination was seen. The needles were then removed. Hemostasis was achieved at the skin entry sites. There were no acute complications. Patient tolerated the procedure well. The patient was observed for 3  hours and discharged in good condition under the care of her daughter. IMPRESSION: 1. Status post vertebral body augmentation for painful compression fractures at T10 and T11 using vertebroplasty technique. Electronically Signed   By: Luanne Bras M.D.   On: 03/20/2016 12:54   Ir Vertebroplasty Ea Addl (t&ls) Bx Inc Uni/bil Inc Inject/imaging  Result Date: 03/23/2016 INDICATION: Severe thoracic pain secondary to compression fractures at T10 and T11. EXAM: IR VERTEBROPLASTY CERVICOTHORACIC INJ; IR VERTEBROPLASTY ADDL INJECTION MEDICATIONS: As antibiotic prophylaxis, 2 g Ancef IV was ordered pre-procedure and administered intravenously within 1 hour of incision. ANESTHESIA/SEDATION: Moderate (conscious) sedation was employed during this procedure. A total of Versed 2 mg and Fentanyl 75 mcg was administered intravenously. Moderate Sedation Time: 25 minutes. The patient's level of consciousness and vital signs were monitored continuously by radiology nursing throughout the  procedure under my direct supervision. FLUOROSCOPY TIME:  Fluoroscopy Time: 30 minutes 30 seconds (277 mGy) COMPLICATIONS: None immediate. TECHNIQUE: Informed written consent was obtained from the patient after a thorough discussion of the procedural risks, benefits and alternatives. All questions were addressed. Maximal Sterile Barrier Technique was utilized including caps, mask, sterile gowns, sterile gloves, sterile drape, hand hygiene and skin antiseptic. A timeout was performed prior to the initiation of the procedure. PROCEDURE: The patient was placed prone on the fluoroscopic table. Nasal oxygen was administered. Physiologic monitoring was performed throughout the duration of the procedure. The skin overlying the region was prepped and draped in the usual sterile fashion. The T10 and T11 vertebral bodies were identified and the right pedicle at T11, and left pedicle at T10 were then infiltrated with 0.25% bupivacaine. This was then  followed by the advancement of a 13-gauge Cook needles through the respective pediclesinto the anterior one-third at T10 and T11. A gentle contrast injection demonstrated a trabecular pattern of contrast with early opacification of paraspinous venous structures. This necessitated the use of Gel-Foam pledgets to embolize the venous channels prior to the delivery of the methylmethacrylate mixture at both levels. At this time, methylmethacrylate mixture was reconstituted. Under biplane intermittent fluoroscopy, the methylmethacrylate was then injected into the T10 and T11 vertebral bodies with filling of the vertebral bodies. No extravasation was noted into the disk spaces or posteriorly into the spinal canal. No epidural venous contamination was seen. The needles were then removed. Hemostasis was achieved at the skin entry sites. There were no acute complications. Patient tolerated the procedure well. The patient was observed for 3 hours and discharged in good condition under the care of her daughter. IMPRESSION: 1. Status post vertebral body augmentation for painful compression fractures at T10 and T11 using vertebroplasty technique. Electronically Signed   By: Luanne Bras M.D.   On: 03/20/2016 12:54    ASSESSMENT & PLAN:  Multiple myeloma in relapse [facility]) Her recent myeloma panel show positive response to treatment. I plan to continue dexamethasone at 1 mg daily for now pending further discussions about goals of care. I will continue hold Pomalyst and daratumumab.  Generalized anxiety with depression She has recurrent anxiety attacks with feeling jittery, depressed and sense of hopelessness because of loss of function, independence and other activities that she used to enjoy prior to feeling unwell related to recent myeloma relapse The patient is tearful. The patient appears to feel better while on lorazepam. I have recently discontinued Xanax and switched her to lorazepam. We also discussed  starting her on mood stabilizers/antidepressants but the patient declined. We also discussed about potential referral to psychologist, psychiatrist or to talk to a pastor but the patient also declined. For now, I will continue lorazepam along with temazepam at night to help with sleep  Physical deconditioning She has significant physical deconditioning and muscle weakness. We discussed referral to home physical therapy and she agreed With uncontrolled pain, I will hold off further physical therapy while hospitalized  Severe uncontrolled pain This coincides with dexamethasone taper For now, I will continue extended release morphine sulfate along with oxycodone for breakthrough pain. I will consult palliative care for assistance in symptom management  Recent DVT/PE Continue Apixaban  Moderate protein calorie malnutrition Clinical dehydration due to poor oral intake She has recent poor oral intake and clinically appears dehydrated This also coincide with recent dexamethasone taper I will start her on gentle IV fluid hydration and will consult dietitian while hospitalized  Recent cardiomyopathy  I will hold off her blood pressure medication due to poor oral intake and risk of dehydration and dizziness  History of severe constipation We will continue aggressive laxative therapy  Goals of care, counseling/discussion DO NOT RESUSCITATE We have discussed goals of care before. The patient is very frail with poor tolerance to treatment. Recently, I have attempted to discuss possibility of discontinuation of chemotherapy but the patient wants to continue treatment. She wants to stay alive to take care of her husband who is 74 years old and frail. However, with continuous decline in performance status, I recommend we direct her current goals of care to getting better. Over the next few days, I will attempt to discuss with the patient again about discontinuation of chemotherapy and transition  her care to comfort care measures Her daughter recently brought a copy of the advanced directives. Both her daughters are dedicated medical power of attorney. I discussed with her daughter, April, again today about CODE STATUS and she agreed to change the CODE STATUS to DO NOT RESUSCITATE The patient agreed to palliative care consult for further discussions about goals of care and symptom management.  Discharge planning Hopefully within the next 3-4 days once we can get her pain under better control and also her anxiety/depression under control She has failed outpatient management   All questions were answered. The patient knows to call the clinic with any problems, questions or concerns. I spent 55 minutes counseling the patient face to face. The total time spent in the appointment was 80 minutes and more than 50% was on counseling.     Heath Lark, MD 04/13/2016 1:27 PM

## 2016-04-13 NOTE — Progress Notes (Signed)
see admission H&P

## 2016-04-14 ENCOUNTER — Encounter (HOSPITAL_COMMUNITY): Payer: Self-pay | Admitting: Hematology and Oncology

## 2016-04-14 DIAGNOSIS — F411 Generalized anxiety disorder: Secondary | ICD-10-CM

## 2016-04-14 DIAGNOSIS — D72819 Decreased white blood cell count, unspecified: Secondary | ICD-10-CM

## 2016-04-14 DIAGNOSIS — D61818 Other pancytopenia: Secondary | ICD-10-CM

## 2016-04-14 DIAGNOSIS — D649 Anemia, unspecified: Secondary | ICD-10-CM

## 2016-04-14 HISTORY — DX: Generalized anxiety disorder: F41.1

## 2016-04-14 LAB — COMPREHENSIVE METABOLIC PANEL
ALBUMIN: 2.7 g/dL — AB (ref 3.5–5.0)
ALT: 18 U/L (ref 14–54)
AST: 21 U/L (ref 15–41)
Alkaline Phosphatase: 58 U/L (ref 38–126)
Anion gap: 6 (ref 5–15)
BUN: 10 mg/dL (ref 6–20)
CHLORIDE: 100 mmol/L — AB (ref 101–111)
CO2: 28 mmol/L (ref 22–32)
CREATININE: 0.52 mg/dL (ref 0.44–1.00)
Calcium: 8 mg/dL — ABNORMAL LOW (ref 8.9–10.3)
GFR calc Af Amer: >60 mL/min (ref >60)
GLUCOSE: 93 mg/dL (ref 65–99)
POTASSIUM: 3.8 mmol/L (ref 3.5–5.1)
Sodium: 134 mmol/L — ABNORMAL LOW (ref 135–145)
Total Bilirubin: 0.6 mg/dL (ref 0.3–1.2)
Total Protein: 5.6 g/dL — ABNORMAL LOW (ref 6.5–8.1)

## 2016-04-14 LAB — CBC
HCT: 24.3 % — ABNORMAL LOW (ref 36.0–46.0)
Hemoglobin: 8.1 g/dL — ABNORMAL LOW (ref 12.0–15.0)
MCH: 35.4 pg — ABNORMAL HIGH (ref 26.0–34.0)
MCHC: 33.3 g/dL (ref 30.0–36.0)
MCV: 106.1 fL — AB (ref 78.0–100.0)
PLATELETS: 181 10*3/uL (ref 150–400)
RBC: 2.29 MIL/uL — AB (ref 3.87–5.11)
RDW: 15.9 % — ABNORMAL HIGH (ref 11.5–15.5)
WBC: 3.2 10*3/uL — AB (ref 4.0–10.5)

## 2016-04-14 MED ORDER — LORAZEPAM 0.5 MG PO TABS
0.5000 mg | ORAL_TABLET | Freq: Three times a day (TID) | ORAL | Status: DC
  Administered 2016-04-14 – 2016-04-15 (×4): 0.5 mg via ORAL
  Filled 2016-04-14 (×4): qty 1

## 2016-04-14 MED ORDER — LORAZEPAM 2 MG/ML IJ SOLN
0.5000 mg | INTRAMUSCULAR | Status: DC | PRN
  Administered 2016-04-14 – 2016-04-15 (×4): 0.5 mg via INTRAVENOUS
  Filled 2016-04-14 (×4): qty 1

## 2016-04-14 MED ORDER — TEMAZEPAM 15 MG PO CAPS
15.0000 mg | ORAL_CAPSULE | Freq: Every day | ORAL | Status: DC
  Administered 2016-04-14: 15 mg via ORAL
  Filled 2016-04-14: qty 1

## 2016-04-14 NOTE — Evaluation (Signed)
Physical Therapy Evaluation Patient Details Name: Natasha Jackson MRN: [id number] DOB: December 11, 1925 Today's Date: 04/14/2016   History of Present Illness  82 yo female admitted with uncontrolled pain. Hx of multiple myeloma-undergoing active tx, CAD, HTN, comp fx-VP T10 and T11, anxiety, PE  Clinical Impression  On eval, pt required Min guard-Min assist for mobility. She walked ~100 feet with a RW. Pt rated pain 3/10 during session.Will follow during hospital stay. Pt and family would like to resume HHPT at discharge.     Follow Up Recommendations Home health PT;Supervision/Assistance - 24 hour    Equipment Recommendations  None recommended by PT    Recommendations for Other Services       Precautions / Restrictions Precautions Precautions: Fall Restrictions Weight Bearing Restrictions: No      Mobility  Bed Mobility Overal bed mobility: Needs Assistance Bed Mobility: Sidelying to Sit;Sit to Supine   Sidelying to sit: Min guard   Sit to supine: Min guard   General bed mobility comments: close guard for safety  Transfers Overall transfer level: Needs assistance Equipment used: Rolling walker (2 wheeled) Transfers: Sit to/from Stand Sit to Stand: Min guard         General transfer comment: close guard for safety. VCs hand placement  Ambulation/Gait Ambulation/Gait assistance: Min assist Ambulation Distance (Feet): 100 Feet Assistive device: Rolling walker (2 wheeled) Gait Pattern/deviations: Step-through pattern;Decreased stride length     General Gait Details: slow gait speed. Assist to maneuver with RW. Pt steered into objects on R side x 2.   Stairs            Wheelchair Mobility    Modified Rankin (Stroke Patients Only)       Balance Overall balance assessment: Needs assistance           Standing balance-Leahy Scale: Poor                               Pertinent Vitals/Pain Pain Assessment: 0-10 Pain Score: 3  Pain  Location: back, hips Pain Descriptors / Indicators: Aching;Sore Pain Intervention(s): Limited activity within patient's tolerance;Repositioned    Home Living Family/patient expects to be discharged to:: Private residence Living Arrangements: Spouse/significant other;Children (husband has dementia) Available Help at Discharge: Family;Available PRN/intermittently (children intermittently) Type of Home: House Home Access: Stairs to enter   [employer] of Steps: 1 Home Layout: One level Home Equipment: Walker - 2 wheels;Cane - quad      Prior Function Level of Independence: Independent with assistive device(s)         Comments: using RW for ambulation     Hand Dominance        Extremity/Trunk Assessment   Upper Extremity Assessment: Generalized weakness           Lower Extremity Assessment: Generalized weakness      Cervical / Trunk Assessment: Kyphotic  Communication   Communication: No difficulties  Cognition Arousal/Alertness: Awake/alert Behavior During Therapy: WFL for tasks assessed/performed Overall Cognitive Status: Within Functional Limits for tasks assessed                      General Comments      Exercises     Assessment/Plan    PT Assessment Patient needs continued PT services  PT Problem List Decreased strength;Decreased mobility;Decreased balance;Decreased activity tolerance;Pain;Decreased knowledge of use of DME          PT Treatment Interventions DME instruction;Gait  training;Therapeutic activities;Therapeutic exercise;Patient/family education;Functional mobility training    PT Goals (Current goals can be found in the Care Plan section)  Acute Rehab PT Goals Patient Stated Goal: less pain. home. PT Goal Formulation: With patient Time For Goal Achievement: 04/28/16 Potential to Achieve Goals: Fair    Frequency Min 3X/week   Barriers to discharge        Co-evaluation               End of Session  Equipment Utilized During Treatment: Gait belt Activity Tolerance: Patient tolerated treatment well Patient left: in bed;with call bell/phone within reach;with family/visitor present;with bed alarm set           Time: 5397-6734 PT Time Calculation (min) (ACUTE ONLY): 18 min   Charges:   PT Evaluation $PT Eval Low Complexity: 1 Procedure     PT G Codes:        Weston Anna, MPT Pager: [phone]

## 2016-04-14 NOTE — Progress Notes (Signed)
Nursing Note: Pt asleep after night meds given.Explained to pt's daughter that only staff are to give her meds ,she could get overmedicated and could be life threatening.After explaining to daughter she says she would let the staff know before she gave the pt anything.Explained to daughter that prn ativan was not ordered Q2hrs.wbb

## 2016-04-14 NOTE — Progress Notes (Signed)
Palliative Medicine Team consult was received.   I stopped by to meet with patient and her daughter this afternoon.  Her daughter reports that her mother "had a bad spell of anxiety" and is finally more calm.  She does not want to talk this afternoon.  She is agreeable to meeting tomorrow morning.  We have scheduled a meeting for tomorrow at 9:00 AM. If there are urgent needs or questions please call [phone]. Thank you for consulting out team to assist with this patients care.  Micheline Rough, MD [facility] [phone]

## 2016-04-14 NOTE — Progress Notes (Signed)
Natasha Jackson   DOB:[birth year]   IF#:[id number]    Subjective: She is seen this morning. Her daughter, April is present. Yesterday, unfortunately the dose of lorazepam was not given correctly. She did not get adequate dosing and that resulted in the patient having significant anxiety last evening. Subsequently, after temazepam dosing, she slept well. Palliative care consult is pending This morning, her back pain is well controlled. Her anxiety level is stable.  Objective:  Vitals:   04/13/16 2203 04/14/16 0511  BP: (!) 143/59 (!) 130/57  Pulse: 81 72  Resp: 18 16  Temp: 98.7 F (37.1 C) 98.2 F (36.8 C)     Intake/Output Summary (Last 24 hours) at 04/14/16 0851 Last data filed at 04/13/16 1908  Gross per 24 hour  Intake              240 ml  Output                0 ml  Net              240 ml    GENERAL:alert, no distress and comfortable SKIN: skin color, texture, turgor are normal, no rashes or significant lesions EYES: normal, Conjunctiva are pink and non-injected, sclera clear Musculoskeletal:no cyanosis of digits and no clubbing  NEURO: alert & oriented x 3 with fluent speech, no focal motor/sensory deficits   Labs:  Lab Results  Component Value Date   WBC 3.2 (L) 04/14/2016   HGB 8.1 (L) 04/14/2016   HCT 24.3 (L) 04/14/2016   MCV 106.1 (H) 04/14/2016   PLT 181 04/14/2016   NEUTROABS 4.8 04/05/2016    Lab Results  Component Value Date   NA 134 (L) 04/14/2016   K 3.8 04/14/2016   CL 100 (L) 04/14/2016   CO2 28 04/14/2016    Assessment & Plan:   Multiple myeloma in relapse ([place]) Her recent myeloma panel show positive response to treatment. I plan to continue dexamethasone at 1 mg daily for now pending further discussions about goals of care. I will continue hold Pomalyst and daratumumab.  Generalized anxiety with depression She has recurrent anxietyattackswith feeling jittery,depressed and sense of hopelessness because of loss of function, independence and  other activities that she used to enjoy prior to feeling unwell related to recent myeloma relapse. The patient appears to feel better while on lorazepam. After much discussion with her daughter, I will place lorazepam at 0.5 mg on scheduled basis along with IV lorazepam as needed for breakthrough anxiety Previously, we had discussed starting her on mood stabilizers/antidepressants but the patient declined.  We had also discussed about potential referral to psychologist, psychiatrist or to talk to a pastor but the patient also declined. For now, I will continue lorazepam along with temazepam at night to help with sleep Awaiting palliative care consult for symptom management  Pancytopenia with mild leukopenia and anemia She is not symptomatic Likely due to underlying bone marrow disease Recommend observation only at this point  Physical deconditioning She has significant physical deconditioning and muscle weakness. We discussed referral to home physical therapy and she agreed Will consult PT while hospitalized  Severe, chronic back pain  This coincides with dexamethasone taper For now, I will continue extended release morphine sulfate along with oxycodone for breakthrough pain. Yesterday, 1 mg of IV Dilaudid in the outpatient clinic seems to be sufficient to control her back pain I will consult palliative care for assistance in symptom management  Recent DVT/PE Continue Apixaban  Moderate protein  calorie malnutrition Clinical dehydration due to poor oral intake She has recent poor oral intake and clinically appears dehydrated This also coincide with recent dexamethasone taper I will start her on gentle IV fluid hydration and will consult dietitian while hospitalized  Recent cardiomyopathy I will hold off her blood pressure medication due to poor oral intake and risk of dehydration and dizziness  History of severe constipation We will continue aggressive laxative  therapy  Goals of care, counseling/discussion DO NOT RESUSCITATE We have discussed goals of care before. The patient is very frail with poor tolerance to treatment. Recently, I have attempted to discuss possibility of discontinuation of chemotherapy but the patient wants to continue treatment. She wants to stay alive to take care of her husband who is 62 years old and frail. However, with continuous decline in performance status, I recommend we direct her current goals of care to getting better. Over the next few days, I will attempt to discuss with the patient again about discontinuation of chemotherapy and transition her care to comfort care measures Her daughter recently brought a copy of the advanced directives. Both her daughters are dedicated medical power of attorney. I discussed with her daughter, April on 04/13/2016 about CODE STATUS and she agreed to change the CODE STATUS to DO NOT RESUSCITATE The patient agreed to palliative care consult for further discussions about goals of care and symptom management.  Discharge planning Hopefully within the next 3-4 days once we can get her pain under better control and also her anxiety/depression under control She has failed outpatient management   Heath Lark, MD 04/14/2016  8:51 AM

## 2016-04-15 DIAGNOSIS — Z7189 Other specified counseling: Secondary | ICD-10-CM

## 2016-04-15 DIAGNOSIS — F410 Panic disorder [episodic paroxysmal anxiety] without agoraphobia: Secondary | ICD-10-CM

## 2016-04-15 DIAGNOSIS — Z515 Encounter for palliative care: Secondary | ICD-10-CM

## 2016-04-15 MED ORDER — ALUM & MAG HYDROXIDE-SIMETH 200-200-20 MG/5ML PO SUSP: 15.0000 mL | ORAL | Status: DC | PRN

## 2016-04-15 MED ORDER — OLANZAPINE 5 MG PO TABS
5.0000 mg | ORAL_TABLET | Freq: Every day | ORAL | Status: DC
  Administered 2016-04-15 – 2016-04-16 (×2): 5 mg via ORAL
  Filled 2016-04-15 (×2): qty 1

## 2016-04-15 MED ORDER — DIAZEPAM 2 MG PO TABS
1.0000 mg | ORAL_TABLET | Freq: Two times a day (BID) | ORAL | Status: DC
  Administered 2016-04-15 – 2016-04-17 (×4): 1 mg via ORAL
  Filled 2016-04-15 (×5): qty 1

## 2016-04-15 MED ORDER — LORAZEPAM 2 MG/ML IJ SOLN
0.5000 mg | INTRAMUSCULAR | Status: DC | PRN
  Administered 2016-04-15 – 2016-04-16 (×2): 1 mg via INTRAVENOUS
  Filled 2016-04-15 (×2): qty 1

## 2016-04-15 MED ORDER — ENSURE ENLIVE PO LIQD
237.0000 mL | Freq: Two times a day (BID) | ORAL | Status: DC
  Administered 2016-04-16: 237 mL via ORAL

## 2016-04-15 MED ORDER — HALOPERIDOL LACTATE 5 MG/ML IJ SOLN
0.5000 mg | Freq: Four times a day (QID) | INTRAMUSCULAR | Status: DC | PRN
  Administered 2016-04-16: 0.5 mg via INTRAVENOUS
  Filled 2016-04-15: qty 1

## 2016-04-15 NOTE — Progress Notes (Signed)
This CM met with pt daughters for discharge planning. PT eval done and recommendations were for HHPT. Pt already active with [facility]. Family want pt to go to snf prior to home however medicare will not pay for snf if HHPT is recommended. This was explained to daughters. PT to continue working with pt during stay and CM will continue to follow and assist with DC planning. Marney Doctor RN,BSN,NCM [phone]

## 2016-04-15 NOTE — Progress Notes (Signed)
Checked in on patient to ensure improvement in symptoms and no signs of excessive sedation.    Patient resting comfortable in bed in no distress.  She reports that her anxiety is much better and she thinks that this dose is more effective.  At that point, her daughter in room started asking how long until she can get the next dose, "she is going to start getting anxious again before then.  Mom, don;t you think that you are going to be more anxious later," and "how are we going to address if she wants more before it is due."  Advised her that I would not increase dose or frequency any further before we have a chance to assess how she responds to current medication regimen.   Daughter reports being concerned that her mother will become agitated as she became agitated at one point last evening.  I discussed with her and patient regarding haldol as needed if she has more agitation rather than anxiety.  I think that she seems to be benefiting from current regimen of benzodiazapines for anxiety, but will add on low dose haldol as needed if agitation becomes more prominent.  Micheline Rough, MD [facility] Team [phone]

## 2016-04-15 NOTE — Progress Notes (Signed)
I was called by bedside RN due to patient's family concern that patient continues to have significant anxiety.  Met with patient and her daughter Claiborne Billings (different daughter than April, who I met earlier today).    Patient reports that she is still feeling anxious and requests more medication to help her relax.  During encounter, it was apparent that she and her daughter were feeding off of each other's anxiety.  Attempted to discuss relaxation strategies, which only exacerbated her daughter's anxiety and increased patient's anxiety as well.  Patient and her daughter deny any sedation to earlier doses of ativan.  After seeing patient this evening, she appears more consistent with anxiety/panic than delirium at this point.     As she is on concurrent narcotic and benzodiazapine therapy, I discussed the risk vs benefit of increased dosage of ativan and that this may contribute to oversedation/respiratory depression.  Her daughter expressed that her family wants to ensure that she is comfortable (though not strictly comfort care) and thinks that she should have higher dose of medication available as needed despite risk.  Patient expressed this is her desire as well.  Increased ativan to 0.5-71m Q 4 hr prn.      Also reviewed chart and note that patient is not eligible for placement at SNF for rehab.  Will discuss with daughter (April) tomorrow regarding options for discharge.  Based on my earlier conversation with April regarding home needs, I strongly believe that she would best be served by hospice support on discharge either to home or residential hospice (if her condition progresses to he point that she would qualify for residential hospice).  GMicheline Rough MD [place] [place] [phone]

## 2016-04-15 NOTE — Progress Notes (Signed)
Initial Nutrition Assessment  DOCUMENTATION CODES:   Severe malnutrition in context of chronic illness  INTERVENTION:   - Provide Magic Cup oral nutrition supplement TID with meals. Each provides 290 kcal and 9 grams protein. - Provide vanilla Ensure Enlive oral nutrition supplement with vanilla ice cream BID in afternoon and evening. Each provides 350 kcal and 20 grams protein. - Encourage PO intake. - Will continue to monitor for nutrition-related needs.  NUTRITION DIAGNOSIS:   Malnutrition related to chronic illness as evidenced by moderate depletion of body fat, severe depletion of muscle mass, energy intake < or equal to 75% for > or equal to 1 month.  GOAL:   Patient will meet greater than or equal to 90% of their needs  MONITOR:   PO intake, Supplement acceptance, Weight trends, I & O's, Skin  REASON FOR ASSESSMENT:   Malnutrition Screening Tool   ASSESSMENT:   82 y.o. female is admitted for symptom management. Pt presents with severe, uncontrolled pain with depression and anxiety. Pt with hx of multiple myeloma.  Spoke with pt and daughter at bedside. Pt reports varied appetite PTA. Pt states she may consume 2-3 meals per day. Pt's daughter states that a meal might consist of a few bites of each food on the plate. For example, pt might consume a few bites of eggs, 1/2 piece of bacon, and coffee at breakfast. Per chart, PO intake at meals is 10-80%. Pt consumed 1/4 cup [employer] soup, 1/2 cookie, and 1 cracker for lunch today. Pt's daughter states pt feels pressure in chest after consuming foods due to narrowing of lower esophagus. Previous MBS indicated narrowed esophagus and instances of aspiration. Pt has to walk around to relieve chest pressure but often loses interest in finishing her meal during that time. Pt's daughter also reports pt has poor hydration and will only drink sips of water throughout the day or sips of beverages at mealtimes.  Pt's daughter reports her UBW  as 135#. States it has been "a while" since she last weighed this. With initial multiple myeloma, pt lost weight rapidly. Since then, pt has lost weight more gradually. Pt's daughter reports pt has lost 7-8# since Christmas 2016. No weight hx in chart prior to June 2017.  Pt's daughter states pt consumed Ensure oral nutrition supplement with vanilla ice cream PTA. States pt would only consume a few bites. Pt and daughter amenable to trying vanilla Ensure Enlive with vanilla ice cream and Magic Cup oral nutrition supplements during current hospitalization. Will order Ensure Enlive with ice cream BID in afternoon and evening. Will order Magic Cup to come with every meal.  Medications reviewed and include 1,000 units cholecalciferol daily (pt refused this AM), 1 mg folic acid daily (pt refused this AM), 50 mcg Synthroid daily, MVI with minerals (pt refused this AM), Senokot tablet twice daily (pt refused this AM), 1,000 mcg vitamin B-12 daily (pt refused this AM), PRN Zofran, PRN Miralax  Labs reviewed and include low sodium (134 mmol/L), low chloride (100 mmol/L), low calcium (8.0 mg/dL)  NFPE: Exam completed. Moderate fat depletion, moderate to severe muscle depletion, and no edema noted.  Diet Order:  Diet regular Room service appropriate? Yes; Fluid consistency: Thin  Skin:  Reviewed, no issues  Last BM:  04/14/16  Height:   Ht Readings from Last 1 Encounters:  03/25/16 _0  (1.626 m)    Weight:   Wt Readings from Last 1 Encounters:  04/08/16 112 lb 6.4 oz (51 kg)  Ideal Body Weight:  54.5 kg  BMI:  19.3 kg/m^2  Estimated Nutritional Needs:   Kcal:  1200-1400 (23-26 kcal/kg)  Protein:  61-71 grams (1.2-1.4 g/kg)  Fluid:  1.2-1.4 L/day  EDUCATION NEEDS:   No education needs identified at this time  Jeb Levering Dietetic Intern Pager Number: [phone]

## 2016-04-15 NOTE — Consult Note (Signed)
Consultation Note Date: 04/15/2016   Patient Name: Natasha Jackson  DOB: 10/20/1925  MRN: [id number]  Age / Sex: 83 y.o., female  PCP: Thressa Sheller, MD Referring Physician: Heath Lark, MD  Reason for Consultation: Establishing goals of care, Non pain symptom management and Pain control  HPI/Patient Profile: 82 y.o. female  with past medical history of multiple myeloma with decreasing functional status, CAD, HTN, multiple compression fractures, PE, anxiety admitted on 04/13/2016 with uncontrolled pain and anxiety.  Palliative consulted for symptom management recommendations as well as assistance with discussions regarding EOL.    Clinical Assessment and Goals of Care:  I met this AM with patient and her daughter in addition to further discussion with family again once her husband arrived.  [name] reports that the most important things to her are her family and living as well as possible for as long as possible.  She worked for [employer] prior to retiring several years ago.  We discussed her clinical course at length, including changes she has been having in her nutrition, cognition, and functional status.  We also discussed the goal of medical care she has been receiving is to add time and quality to her life. I did not originally push farther than this in our conversation as I did not want to overwhelm the patient or her family.  Her daughter, however, expressed that she felt that her mother and father needed more information about where she is in her disease course in order to make informed decisions about her care moving forward.    Per her daughter's request, I asked if the patient would like more information and she replied that she would. We then discussed that there is a point in every chronic disease where continuation of disease modifying therapy is not likely to add time and quality to life, but this does  not mean that there is not care left to give.  At that point, rather, we need to focus care on aggressive symptom management to preserve functional status and live each day as well as possible.  I shared that I do not know exactly where she is in her disease course, but I am worried that we are at a point where she may not benefit in adding more quality time to her life by continuing to pursue aggressive therapies.  Her family then asked where I though she was regarding her prognosis.  I explained that my best guess is that we were likely in a period of weeks to months if she continues along her current trajectory based on the decline in nutrition and functional status that she has been experiencing.  We also discussed her continued anxiety and the fact that she appears to be having discreet periods of worsening consistent with panic attacks.  These have improved since starting on scheduled ativan, however, she has continued to have periods of acute worsening of her anxiety.  Discussed medications for anxiety, including SSRIs, benzodiazapines, and olanzapine.  Her daughter is concerned that she may be having  periods of more confusion as well.    We discussed her pain and she reports that this currently is improved.  Her daughter was concerned that her confusion may be related to being on morphine as she has read this can be a side effect. We discussed this as well and the fact that her mother has appropriate kidney function which would make it less likely that she would have metabolite buildup they could cause confusion. I do agree with her that this is a consideration to keep in mind, however, she is in agreement that we should not change multiple things at one time in order try and differentiate what is the most likely things to be beneficial moving forward.            Additionally, I met with patient's daughter outside of the room after seen her in the hallway and her asking to speak further. We talked  about pathways forward for discharge including potential for discharge home with hospice, residential hospice facility, skilled facility with hospice, or skilled facility for attempt at rehabilitation (if she would qualify for this).  She is concerned about discharge home even if her mother were to elect to enroll the hospice benefit. She does think that her mother eventually would like to be home and states that she wants to try to work out how this may be possible. I shared with her that based upon her condition today, I am not sure that her mother would qualify for residential hospice at this specific point. I do think there is high likelihood if she continues to decline as she has been over the past several days to weeks that she would be residential hospice appropriate in the near future. She reports that there are monetary concerns that would preclude hiring 24 hour care at home.  She reports today that she thinks that her mother may be best served by going to rehab for a period to try to regain some strength followed by eventual transition either to home or to residential hospice depending on how she does during trial of rehab.    SUMMARY OF RECOMMENDATIONS   - Patient is DNR/DNI - Anxiety/panic disorder: Discussed options with patient and her daughter and she is feeling better on continued benzodiazapine therapy.  Her anxiety does seem to worsen when her ativan wears off.  While she would likely benefit from addition of SSRI, this takes several weeks to take effect.  I am not sure that timing of waiting for this is reasonable based on significant decline in her nutritional and functional status over the last week.  Will therefore plan for trial of longer acting benzo (diazepam 22m BID) with continuation of short acting (lorazepam 0.521mQ4H prn).  See Palliative Care Fast Facts #145 ([email or web address]/  Also with her poor sleep, poor appetite, anxiety, and concern for elements of  confusion with high risk of delirium, discussed initiation of Zyprexa with patient and her family.  Trial of Zyprexa 49m52mHS. - Pain: Currently reports that her pain is well controlled.  Her daughter expressed concern that she might be more confused related to morphine. We discussed rotation to another long-acting opioid, but she agrees with only changing one area of treatment at a time in order to try and determine what is most likely to be helpful. We discussed that she is more likely to benefit from focusing on anxiety at this time and will therefore continue with current regimen as her pain is currently well controlled.  I would consider rotation of MS Contin to OxyContin if needed. - Goals of care: The patient's daughter was concerned that her mother did not have a clear picture of where she is at in her disease process. When asked, the patient reports that she wanted more information and therefore we discussed goal of continued aggressive therapy to be adding time in quality to her life. I expressed concern that we may have maximized the benefit she is likely to receive from continuation of disease modifying therapy and this is something that she should continue to discuss with Dr. Alvy Bimler.  Family asked my thoughts on prognosis, and I told them this is something that no physician can determine with certainty, however, with the change noted in her nutrition, functional status, and cognition, I believe it is very possible that we may be approaching the last weeks to months of life.  Patient and her family report needing time to process this.  Plan for f/u tomorrow.   Code Status/Advance Care Planning:  DNR   Symptom Management:   As above  Palliative Prophylaxis:   Delirium Protocol and Frequent Pain Assessment  Psycho-social/Spiritual:   Desire for further Chaplaincy support:no  Additional Recommendations: Education on Hospice  Prognosis:   Weeks to months.  Discharge Planning: To Be  Determined      Primary Diagnoses: Present on Admission: . Multiple myeloma in relapse ([place]) . Vertebral compression fracture ([place]) . Depression . Chronic back pain greater than 3 months duration . Protein-calorie malnutrition, moderate ([place]) . Pulmonary embolism ([place]) . Anxiety attack . Physical deconditioning . Myeloma ([place])   I have reviewed the medical record, interviewed the patient and family, and examined the patient. The following aspects are pertinent.  Past Medical History:  Diagnosis Date  . Aortic regurgitation   . Breast cancer ([place])   . Coronary artery disease   . DCIS (ductal carcinoma in situ) 08/04/2012  . Depression 09/05/2014  . Dysphagia 01/23/2016  . Generalized anxiety disorder 04/14/2016  . Hot flash not due to menopause 12/23/2011  . HTN (hypertension)   . Hypothyroidism 10/18/2014  . Multiple myeloma   . Multiple myeloma ([place]) 10/11/2015  . Neuropathy ([place]) 12/23/2011  . Spinal fracture of T12 vertebra ([facility])    vertebral fx   Social History   Social History  . Marital status: Married    Spouse name: N/A  . Number of children: N/A  . Years of education: N/A   Social History Main Topics  . Smoking status: Never Smoker  . Smokeless tobacco: Never Used  . Alcohol use No  . Drug use: No  . Sexual activity: No   Other Topics Concern  . Not on file   Social History Narrative  . No narrative on file   Family History  Problem Relation Age of Onset  . Stroke Father   . Heart failure Mother    Scheduled Meds: . apixaban  5 mg Oral BID  . cholecalciferol  1,000 Units Oral Daily  . dexamethasone  1 mg Oral Daily  . diazepam  1 mg Oral BID  . folic acid  1 mg Oral Daily  . gabapentin  300 mg Oral QHS  . levothyroxine  50 mcg Oral QAC breakfast  . morphine  60 mg Oral Q12H  . multivitamin with minerals  1 tablet Oral Daily  . OLANZapine  5 mg Oral QHS  . senna-docusate  1 tablet Oral BID  . cyanocobalamin  1,000 mcg Oral Daily   Continuous  Infusions: . sodium chloride 50 mL/hr at 04/14/16 1012   PRN Meds:.acetaminophen, alum & mag hydroxide-simeth, artificial tears, HYDROmorphone (DILAUDID) injection, LORazepam, nitroGLYCERIN, ondansetron **OR** ondansetron **OR** ondansetron (ZOFRAN) IV **OR** ondansetron (ZOFRAN) IV, oxycodone, polyethylene glycol Medications Prior to Admission:  Prior to Admission medications   Medication Sig Start Date End Date Taking? Authorizing Provider  apixaban (ELIQUIS) 5 MG TABS tablet Take 1 tablet (5 mg total) by mouth 2 (two) times daily. apixaban take 10 mg two times daily for 4 days then take apixaban  5 mg twice daily Patient taking differently: Take 5 mg by mouth 2 (two) times daily.  03/29/16  Yes Kinnie Feil, MD  Artificial Tear Ointment (ARTIFICIAL TEARS) ointment Apply 1 drop to eye as needed (dry eyes).    Yes Historical Provider, MD  cholecalciferol (VITAMIN D) 1000 UNITS tablet Take 1,000 Units by mouth daily. 05/01/13  Yes Heath Lark, MD  dexamethasone (DECADRON) 1 MG tablet Take 1 tablet (1 mg total) by mouth daily. Patient taking differently: Take 1 mg by mouth every other day.  04/08/16  Yes Heath Lark, MD  folic acid (FOLVITE) 1 MG tablet Take 1 tablet (1 mg total) by mouth daily. 03/29/16  Yes Kinnie Feil, MD  furosemide (LASIX) 20 MG tablet Take 1 tablet daily as needed for swelling Patient taking differently: Take 20 mg by mouth daily as needed for fluid. Take 1 tablet daily as needed for swelling 03/24/16  Yes Eileen Stanford, PA-C  gabapentin (NEURONTIN) 300 MG capsule TAKE ONE CAPSULE BY MOUTH THREE TIMES DAILY Patient taking differently: TAKE ONE CAPSULE BY MOUTH AT BEDTIME 04/13/16  Yes Heath Lark, MD  levothyroxine (SYNTHROID, LEVOTHROID) 50 MCG tablet Take 1 tablet (50 mcg total) by mouth daily before breakfast. 04/03/16  Yes Heath Lark, MD  LORazepam (ATIVAN) 1 MG tablet Take 1 tablet (1 mg total) by mouth every 8 (eight) hours as needed for anxiety. 04/08/16  Yes Heath Lark, MD  morphine (MS CONTIN) 60 MG 12 hr tablet Take 1 tablet (60 mg total) by mouth every 12 (twelve) hours. 03/12/16  Yes Heath Lark, MD  Multiple Vitamin (MULTIVITAMIN WITH MINERALS) TABS tablet Take 1 tablet by mouth daily.   Yes Historical Provider, MD  oxycodone (ROXICODONE) 30 MG immediate release tablet Take 1 tablet (30 mg total) by mouth every 4 (four) hours as needed. Patient taking differently: Take 30 mg by mouth every 4 (four) hours as needed for pain.  03/31/16  Yes Heath Lark, MD  polyethylene glycol (MIRALAX / GLYCOLAX) packet Take 17 g by mouth daily as needed for mild constipation. 03/29/16  Yes Kinnie Feil, MD  senna-docusate (SENOKOT-S) 8.6-50 MG tablet Take 1 tablet by mouth 2 (two) times daily. Patient taking differently: Take 1 tablet by mouth as directed. TAKES 1 TABLET TWICE WEEKLY ON Tuesday AND FRIDAY 03/29/16  Yes Kinnie Feil, MD  temazepam (RESTORIL) 15 MG capsule Take 1 capsule (15 mg total) by mouth at bedtime as needed for sleep. 03/31/16  Yes Heath Lark, MD  valsartan (DIOVAN) 160 MG tablet Take 1 tablet (160 mg total) by mouth daily. 10/23/15  Yes Eileen Stanford, PA-C  vitamin B-12 1000 MCG tablet Take 1 tablet (1,000 mcg total) by mouth daily. 03/29/16  Yes Kinnie Feil, MD  nitroGLYCERIN (NITROSTAT) 0.4 MG SL tablet Place 1 tablet (0.4 mg total) under the tongue every 5 (five) minutes as needed for chest pain. 03/17/16   Josue Hector, MD  Allergies  Allergen Reactions  . No Known Allergies    Review of Systems  Constitutional: Positive for activity change, appetite change, fatigue and unexpected weight change.  Gastrointestinal: Positive for constipation.  Psychiatric/Behavioral: Positive for agitation, decreased concentration and sleep disturbance. The patient is nervous/anxious and is hyperactive.     Physical Exam  General: Alert, awake, Intermittent anxiety. HEENT: No bruits, no goiter, no JVD Heart: Regular rate and rhythm. No  murmur appreciated. Lungs: Fair air movement, clear Abdomen: Soft, nontender, nondistended, positive bowel sounds.  Ext: No significant edema Skin: Warm and dry Neuro: Grossly intact, nonfocal.  Vital Signs: BP (!) 149/61 (BP Location: Left Arm)   Pulse 74   Temp 98.4 F (36.9 C) (Oral)   Resp 16   SpO2 100%  Pain Assessment: No/denies pain   Pain Score: Asleep   SpO2: SpO2: 100 % O2 Device:SpO2: 100 % O2 Flow Rate: .   IO: Intake/output summary:  Intake/Output Summary (Last 24 hours) at 04/15/16 1448 Last data filed at 04/15/16 1101  Gross per 24 hour  Intake             1584 ml  Output              600 ml  Net              984 ml    LBM: Last BM Date: 04/14/16 Baseline Weight:   Most recent weight:       Palliative Assessment/Data:   Flowsheet Rows   Flowsheet Row Most Recent Value  Intake Tab  Referral Department  Oncology  Unit at Time of Referral  Oncology Unit  Palliative Care Primary Diagnosis  Cancer  Date Notified  04/13/16  Palliative Care Type  New Palliative care  Reason for referral  Clarify Goals of Care, Counsel Regarding Hospice, End of Life Care Assistance  Date of Admission  04/13/16  Date first seen by Palliative Care  04/15/16  # of days Palliative referral response time  2 Day(s)  # of days IP prior to Palliative referral  0  Clinical Assessment  Palliative Performance Scale Score  40%  Pain Max last 24 hours  6  Pain Min Last 24 hours  0  Psychosocial & Spiritual Assessment  Palliative Care Outcomes  Patient/Family meeting held?  Yes  Who was at the meeting?  Patient, husband, daughter  [place] regarding hospice      Time In: 0920 Time Out: 1100 Time Total: 100 minutes Greater than 50%  of this time was spent counseling and coordinating care related to the above assessment and plan.  Signed by: Micheline Rough, MD   Please contact Palliative Medicine Team phone at [phone] for questions and concerns.   For individual provider: See Shea Evans

## 2016-04-15 NOTE — Progress Notes (Signed)
Natasha Jackson   DOB:03/08/1926   MR#:[id number]    Subjective: She is sleeping. Appears comfortable. I spoke with Natasha Jackson, her daughter regarding events from yesterday. Her pain was under controlled and she was able to work with PT. She had significant episode of emotional breakdown with crying that was recorded and she showed it to me. This happens around 3 pm. She had received lorazepam at 1 pm. The patient was also noted to be mildly confused with gibberish conversation. She slept well last night  Objective:  Vitals:   04/14/16 2210 04/15/16 0500  BP: 123/61 (!) 128/46  Pulse: 82 (!) 59  Resp: 20 16  Temp: 98.9 F (37.2 C) 98.7 F (37.1 C)     Intake/Output Summary (Last 24 hours) at 04/15/16 0835 Last data filed at 04/14/16 1800  Gross per 24 hour  Intake             1704 ml  Output              600 ml  Net             1104 ml    GENERAL: sleeping, no distress and comfortable SKIN: skin color, texture, turgor are normal, no rashes or significant lesions Musculoskeletal:no cyanosis of digits and no clubbing    Labs:  Lab Results  Component Value Date   WBC 3.2 (L) 04/14/2016   HGB 8.1 (L) 04/14/2016   HCT 24.3 (L) 04/14/2016   MCV 106.1 (H) 04/14/2016   PLT 181 04/14/2016   NEUTROABS 4.8 04/05/2016    Lab Results  Component Value Date   NA 134 (L) 04/14/2016   K 3.8 04/14/2016   CL 100 (L) 04/14/2016   CO2 28 04/14/2016    Assessment & Plan:   Multiple myeloma in relapse ([facility]) Her recent myeloma panel show positive response to treatment. I plan to continue dexamethasone at1 mg daily for now pending further discussions about goals of care. I will continue hold Pomalyst and daratumumab.  Generalized anxiety with depression She has recurrent anxietyattackswith feeling jittery,depressed and sense of hopelessness because of loss of function, independence and other activities that she used to enjoy prior to feeling unwell related to recent myeloma relapse. The  patient appears to feel better while on lorazepam. After much discussion with her daughter, I have placed her on  lorazepam at 0.5 mg on scheduled basis along with IV lorazepam as needed for breakthrough anxiety Previously, we had discussed starting her on mood stabilizers/antidepressants but the patient declined.  We had also discussed about potential referral to psychologist, psychiatrist or to talk to a pastor but the patient also declined. For now, I will continue lorazepam along with temazepam at night to help with sleep Awaiting palliative care consult for symptom management  Pancytopenia with mild leukopenia and anemia She is not symptomatic Likely due to underlying bone marrow disease Recommend observation only at this point  Physical deconditioning She has significant physical deconditioning and muscle weakness. We discussed referral to home physical therapy and she agreed Will continue PT as tolerated while hospitalized  Severe, chronic back pain  This coincides with dexamethasone taper For now, I will continue extended release morphine sulfate along with oxycodone for breakthrough pain. Yesterday, 1 mg of IV Dilaudid in the outpatient clinic seems to be sufficient to control her back pain Awaiting palliative care for assistance in symptom management  Recent DVT/PE Continue Apixaban  Moderate protein calorie malnutrition Clinical dehydration due to poor oral intake She   has recent poor oral intake and clinically appears dehydrated This also coincide with recent dexamethasone taper I will continue her on gentle IV fluid hydration and will consult dietitian while hospitalized  Recent cardiomyopathy I will hold off her blood pressure medication due to poor oral intake and risk of dehydration and dizziness  History of severe constipation We will continue aggressive laxative therapy  Goals of care, counseling/discussion DO NOT RESUSCITATE  We have discussed goals of  care before. The patient is very frail with poor tolerance to treatment. Recently, I have attempted to discuss possibility of discontinuation of chemotherapy but the patient wants to continue treatment. She wants to stay alive to take care of her husband who is 90 years old and frail. However, with continuous decline in performance status, I recommend we direct her current goals of care to getting better. Over the next few days, I will attempt to discuss with the patient again about discontinuation of chemotherapy and transition her care to comfort care measures Her daughter recentlybrought a copy of the advanced directives. Both her daughters are dedicated medical power of attorney. I discussed with her daughter, Natasha Jackson on 04/13/2016 about CODE STATUS and she agreed to change the CODE STATUS to DO NOT RESUSCITATE The patient agreed to palliative care consult for further discussions about goals of care and symptom management. I also recommend her daughter to contact the patient's church minister/pastor for counseling and spiritual support  Discharge planning Hopefully within the next 3-4 days once we can get her anxiety/depression under control She has failed outpatient management She is currently not safe for discharge and is at significant fall risk. Her home situation is prohibitive with lack of adequate support for her to be discharged. She may need to be discharged to [facility] at some point once we have achieved reasonable control of her symptoms   , MD 04/15/2016  8:35 AM  

## 2016-04-15 NOTE — Progress Notes (Signed)
Spent 30 minutes addressing goals of care with her daughter today

## 2016-04-16 ENCOUNTER — Telehealth: Payer: Self-pay | Admitting: *Deleted

## 2016-04-16 ENCOUNTER — Telehealth (HOSPITAL_COMMUNITY): Payer: Self-pay

## 2016-04-16 DIAGNOSIS — D72829 Elevated white blood cell count, unspecified: Secondary | ICD-10-CM

## 2016-04-16 MED ORDER — OXYCODONE HCL 20 MG/ML PO CONC
30.0000 mg | ORAL | Status: DC | PRN
  Administered 2016-04-17: 30 mg via ORAL
  Filled 2016-04-16: qty 2

## 2016-04-16 MED ORDER — LORAZEPAM 2 MG/ML IJ SOLN
1.0000 mg | INTRAMUSCULAR | Status: DC | PRN
  Administered 2016-04-16: 1 mg via INTRAVENOUS
  Filled 2016-04-16: qty 1

## 2016-04-16 MED ORDER — HALOPERIDOL LACTATE 2 MG/ML PO CONC
1.0000 mg | Freq: Four times a day (QID) | ORAL | Status: DC | PRN
  Filled 2016-04-16: qty 0.5

## 2016-04-16 MED ORDER — HALOPERIDOL LACTATE 5 MG/ML IJ SOLN: 1.0000 mg | Freq: Four times a day (QID) | INTRAMUSCULAR | Status: DC | PRN

## 2016-04-16 MED ORDER — LORAZEPAM 2 MG/ML PO CONC: 1.0000 mg | ORAL | Status: DC | PRN

## 2016-04-16 MED ORDER — SODIUM CHLORIDE 0.9% FLUSH
10.0000 mL | INTRAVENOUS | Status: DC | PRN
Start: 2016-04-16 — End: 2016-04-17

## 2016-04-16 NOTE — Progress Notes (Signed)
Daily Progress Note   Patient Name: Natasha Jackson       Date: 04/16/2016 DOB: [birth year]  Age: 82 y.o. MRN#: [id number] Attending Physician: Heath Lark, MD Primary Care Physician: Thressa Sheller, MD Admit Date: 04/13/2016  Reason for Consultation/Follow-up: Establishing goals of care, Hospice Evaluation, Non pain symptom management, Pain control and Psychosocial/spiritual support  Subjective: I met this AM with patient and Natasha daughter, Natasha Jackson.  Natasha Jackson was awake and alert and eating breakfast, however she remained intermittently confused and unable to fully participate in conversation and decision making process.  Discussed again wishes of being at home and being free of pain and anxiety.  Natasha Jackson and I then met outside of the room and she reports that Natasha Jackson is clear in Natasha wishes to go home and that she wants to go home with the support of hospice "to try, at least."  She understands that Natasha Jackson is going to continue to decline quickly and Natasha care needs will continue to increase.  At that point in time, she is hopeful that Natasha Jackson is agreeable to Natasha Jackson transitioning to residential hospice for EOL care.  We discussed symptom management including pain and anxiety as well as risk of sedation and respiratory depression with concurrent benzos and opioids.  Natasha Jackson this AM was clear in Natasha wish to be comfortable, and daughter agrees that comfort is primary goals and medications for comfort should not be held if needed for Natasha comfort.    Length of Stay: 3  Current Medications: Scheduled Meds:  . apixaban  5 mg Oral BID  . cholecalciferol  1,000 Units Oral Daily  . dexamethasone  1 mg Oral Daily  . diazepam  1 mg Oral BID  . feeding supplement (ENSURE ENLIVE)  237 mL Oral BID BM   . folic acid  1 mg Oral Daily  . gabapentin  300 mg Oral QHS  . levothyroxine  50 mcg Oral QAC breakfast  . morphine  60 mg Oral Q12H  . multivitamin with minerals  1 tablet Oral Daily  . OLANZapine  5 mg Oral QHS  . senna-docusate  1 tablet Oral BID  . cyanocobalamin  1,000 mcg Oral Daily    Continuous Infusions:    PRN Meds: acetaminophen, alum & mag hydroxide-simeth, artificial tears, haloperidol lactate, HYDROmorphone (DILAUDID)  injection, LORazepam, nitroGLYCERIN, ondansetron **OR** ondansetron **OR** ondansetron (ZOFRAN) IV **OR** ondansetron (ZOFRAN) IV, oxycodone, polyethylene glycol, sodium chloride flush  Physical Exam     General: Alert, awake. Eating breakfast HEENT: No bruits, no goiter, no JVD Heart: Regular rate and rhythm. No murmur appreciated. Lungs: Fair air movement, clear Abdomen: Soft, nontender, nondistended, positive bowel sounds.  Ext: No significant edema Skin: Warm and dry Neuro: Grossly intact, nonfocal.      Vital Signs: BP (!) 136/59 (BP Location: Left Arm)   Pulse 71   Temp 98.8 F (37.1 C) (Oral)   Resp 16   SpO2 99%  SpO2: SpO2: 99 % O2 Device: O2 Device: Not Delivered O2 Flow Rate:    Intake/output summary:  Intake/Output Summary (Last 24 hours) at 04/16/16 1329 Last data filed at 04/16/16 6712  Gross per 24 hour  Intake              240 ml  Output             1100 ml  Net             -860 ml   LBM: Last BM Date: 04/15/16 Baseline Weight:   Most recent weight:         Palliative Assessment/Data: 30%   Flowsheet Rows   Flowsheet Row Most Recent Value  Intake Tab  Referral Department  Oncology  Unit at Time of Referral  Oncology Unit  Palliative Care Primary Diagnosis  Cancer  Date Notified  04/13/16  Palliative Care Type  New Palliative care  Reason for referral  Clarify Goals of Care, Counsel Regarding Hospice, End of [place]  Date of Admission  04/13/16  Date first seen by Palliative Care  04/15/16  #  of days Palliative referral response time  2 Day(s)  # of days IP prior to Palliative referral  0  Clinical Assessment  Palliative Performance Scale Score  40%  Pain Max last 24 hours  6  Pain Min Last 24 hours  0  Psychosocial & Spiritual Assessment  Palliative Care Outcomes  Patient/Family meeting held?  Yes  Who was at the meeting?  Patient, husband, daughter  Palliative Care Outcomes  Counseled regarding hospice      Patient Active Problem List   Diagnosis Date Noted  . Generalized anxiety disorder 04/14/2016  . Myeloma ([place]) 04/13/2016  . Physical deconditioning 04/08/2016  . Diarrhea 04/03/2016  . Insomnia due to stress 03/31/2016  . Anxiety attack 03/31/2016  . Goals of care, counseling/discussion 03/31/2016  . Acute respiratory failure with hypoxia ([place]) 03/25/2016  . Pulmonary embolus ([place]) 03/25/2016  . Pulmonary embolism ([place]) 03/24/2016  . Dizziness 03/12/2016  . Quality of life palliative care encounter 02/07/2016  . Dysphagia 01/23/2016  . Protein-calorie malnutrition, moderate ([place]) 01/23/2016  . GERD (gastroesophageal reflux disease) 01/09/2016  . Anemia in neoplastic disease 01/09/2016  . Constipation due to opioid therapy 10/21/2015  . Multiple myeloma ([place]) 10/11/2015  . Chronic back pain greater than 3 months duration 09/26/2015  . Left leg pain 11/22/2014  . Rib pain on left side 11/22/2014  . Hypothyroidism 10/18/2014  . Neutropenia ([place]) 09/20/2014  . Chest pain 09/20/2014  . Depression 09/05/2014  . Arthropathy   . Dehydration, moderate 08/01/2014  . Vertebral compression fracture ([place]) 07/19/2014  . Pancytopenia due to antineoplastic chemotherapy ([place]) 07/05/2014  . Rib pain on right side 06/18/2014  . Bradycardia 09/22/2012  . DCIS (ductal carcinoma in situ) 08/04/2012  . Hot flash not  due to menopause 12/23/2011  . Neuropathy due to chemotherapeutic drug ([place]) 12/23/2011  . OTHER SPECIFIED IDIOPATHIC PERIPHERAL NEUROPATHY 06/25/2010  .  LBBB (left bundle branch block) 06/05/2009  . Multiple myeloma in relapse ([place]) 01/04/2009  . Essential hypertension, benign 01/04/2009  . DERMATITIS 01/03/2009  . ARTHRITIS 01/03/2009  . POSTMENOPAUSAL STATUS 01/03/2009    Palliative Care Assessment & Plan   Patient Profile: 82 yo female with multiple myeloma  Recommendations/Plan: Pain: Appears well controlled on current regimen.  On discharge, recommend continue MS Contin as well as rescue oxycodone as needed.  I did transition rescue oxycodone to oral concentrate in anticipation of discharge home on this medication. Anxiety: Recommend continue valium 75m BID, lorazepam 129mQ4hr as needed. Agitation: Haldol as needed.  Goals is discharge home with hospice support.  In addition to Natasha other medications, for symptom management on discharge, recommend: For pain:  MS Contin 6040mID (for as long as she can tolerate pills) Oxycodone Concentrate 68m90m5ml: 30mg62m5ml) 77mlingual every 1 hour as needed for pain or shortness of breath: Disp 30ml  72manxiety:  Diazepam 1mg BID44mor as long as she can tolerate pills) Zyprexa 5mg nigh37m Lorazepam 2mg/ml co64mntrated solution: 1mg (0.5ml29mublin102ml every 4 hours as needed for anxiety: Disp 30ml  For Ag62mion:  Haldol 2mg/ml soluti40m 0.5mg (0.25ml) s73mingua107mery 4 hours as needed for agitation or nausea: Disp 30ml     Goals o47mre and Additional Recommendations:  Limitations on Scope of Treatment: Full Comfort Care  Code Status:    Code Status Orders        Start     Ordered   04/13/16 1327  Do not attempt resuscitation (DNR)  Continuous    Question Answer Comment  In the event of cardiac or respiratory ARREST Do not call a "code blue"   In the event of cardiac or respiratory ARREST Do not perform Intubation, CPR, defibrillation or ACLS   In the event of cardiac or respiratory ARREST Use medication by any route, position, wound care, and other measures to relive pain  and suffering. May use oxygen, suction and manual treatment of airway obstruction as needed for comfort.      04/13/16 1326    Code Status History    Date Active Date Inactive Code Status Order ID Comments User Context   03/25/2016  1:05 AM 03/29/2016  4:37 PM Partial Code [id number]  Nikki [id number] Lily Kocher9/05/2016  3:51 PM 03/17/2016  5:49 PM Partial Code [id number]  [id number] Stanford  03/16/2016  1:19 PM 03/16/2016  3:50 PM Partial Code [id number]  Henry [id number] Crome16  1:54 AM 09/21/2014  8:43 PM Full Code [id number]  [id number] MLavina Hamman1/27/2016  3:56 PM 08/05/2014  2:42 PM Full Code [id number]  [id number] Reyne Dumas16 11:17 AM 08/01/2014  3:37 AM Full Code [id number]  [id number] Hickman2015  3:34 PM 06/27/2014  3:38 AM Full Code [id number]  John [id number] Mariscalce Directive Documentation   Flowsheet Row Most Recent Value  Type of Advance Directive  Healthcare Power of Attorney, Living will  Pre-existing out of facility DNR order (yellow form or pink MOST form)  No data  "MOST" Form in Place?  No data       Prognosis:   < 2 weeks.  Natasha condition has continued to decline. She has had  minimal oral hydration and have now stopped IV fluids. I anticipate that Natasha condition will continue to decline quickly. Goal is to transition home with support of hospice for as long as they can maintain as this is patient's desire. They are interested in transitioning to inpatient hospice after she has been able to go to Natasha own home for a period of time. I do think she would be well served by residential hospice placement at some point in the future as she will likely require titration of medications for anxiety and pain as Natasha condition worsens, and Natasha prognosis is likely less than 2 weeks based upon continued decline and lack of nutrition and hydration.  Discharge Planning:  Home with Hospice  Care plan was discussed with patient,  family, bedside RN, Dr. Alvy Bimler  Thank you for allowing the Palliative Medicine Team to assist in the care of this patient.   Time In: 0845 Time Out: 0955 Total Time 70 Prolonged Time Billed Yes      Greater than 50%  of this time was spent counseling and coordinating care related to the above assessment and plan.  Micheline Rough, MD  Please contact Palliative Medicine Team phone at [phone] for questions and concerns.

## 2016-04-16 NOTE — Progress Notes (Signed)
Natasha Jackson   DOB:[birth year]   QD#:[id number]    Subjective: The patient is intermittently alert this morning. I spent a lot of time with her daughter discussing the plan of care. Since the addition of Valium, her anxiety is better controlled. The patient had very poor oral intake. Family members desire to take her home with home-based palliative care/hospice in place. Her pain appears to be under good control.  Objective:  Vitals:   04/15/16 2020 04/16/16 0725  BP: (!) 141/56 (!) 136/59  Pulse: 85 71  Resp: 16 16  Temp: 98.6 F (37 C) 98.8 F (37.1 C)     Intake/Output Summary (Last 24 hours) at 04/16/16 1037 Last data filed at 04/16/16 0942  Gross per 24 hour  Intake              360 ml  Output             1100 ml  Net             -740 ml    GENERAL:alert, no distress and comfortable, sleeopy SKIN: skin color, texture, turgor are normal, no rashes or significant lesions EYES: normal, Conjunctiva are pink and non-injected, sclera clear Musculoskeletal:no cyanosis of digits and no clubbing  NEURO:drowsy with fluent speech, no focal motor/sensory deficits   Labs:  Lab Results  Component Value Date   WBC 3.2 (L) 04/14/2016   HGB 8.1 (L) 04/14/2016   HCT 24.3 (L) 04/14/2016   MCV 106.1 (H) 04/14/2016   PLT 181 04/14/2016   NEUTROABS 4.8 04/05/2016    Lab Results  Component Value Date   NA 134 (L) 04/14/2016   K 3.8 04/14/2016   CL 100 (L) 04/14/2016   CO2 28 04/14/2016    Assessment & Plan:   Multiple myeloma in relapse ([place]) Her recent myeloma panel show positive response to treatment. I plan to continue dexamethasone at1 mg daily for now for stabilization of bone pain I will discontinue chemotherapy.  Generalized anxiety with depression She has recurrent anxietyattackswith feeling jittery,depressed and sense of hopelessness because of loss of function, independence and other activities that she used to enjoy prior to feeling unwell related to recent myeloma  relapse. The patient appears to feel better while on lorazepam.After much discussion with her daughter, I have placed her on  lorazepam at0.5 mg on scheduled basis along with IV lorazepam as needed for breakthrough anxiety Previously, we had discussed starting her on mood stabilizers/antidepressants but the patient declined. We had also discussed about potential referral to psychologist, psychiatrist or to talk to a pastor but the patient also declined. For now, I will continue lorazepam along with temazepam at night to help with sleep Palliative care recommended addition of Valium and the patient have good success. I will defer to them for further management  Pancytopenia with mild leukopenia and anemia She is not symptomatic Likely due to underlying bone marrow disease Recommend observation only at this point  Physical deconditioning She has significant physical deconditioning and muscle weakness. We discussed referral to home physical therapy and she agreed Will continue PT as tolerated while hospitalized  Severe, chronic back pain  This coincides with dexamethasone taper For now, I will continue extended release morphine sulfate along with oxycodone for breakthrough pain. Yesterday, 1 mg of IV Dilaudid in the outpatient clinic seems to be sufficient to control her back pain Continue current pain management  Recent DVT/PE Continue Apixaban. I will consider discontinuation upon discharge  Moderate protein calorie malnutrition  Clinical dehydration due to poor oral intake She has recent poor oral intake and clinically appears dehydrated This also coincide with recent dexamethasone taper I will discontinue IV fluids  Recent cardiomyopathy I will hold off her blood pressure medication due to poor oral intake and risk of dehydration and dizziness  History of severe constipation We will continue aggressive laxative therapy  Goals of care, counseling/discussion DO NOT  RESUSCITATE  We have discussed goals of care before. The patient is very frail with poor tolerance to treatment. Recently, I have attempted to discuss possibility of discontinuation of chemotherapy but the patient wants to continue treatment. She wants to stay alive to take care of her husband who is 66 years old and frail. However, with continuous decline in performance status, I recommend we direct her current goals of care to getting better. After much discussion, it is clear that the patient will not be a candidate for further chemotherapy.  Over the next few days, I will transition her care to comfort care measures Her daughter recentlybrought a copy of the advanced directives. Both her daughters are dedicated medical power of attorney. I discussed with her daughter, April on 04/13/2016 about CODE STATUS and she agreed to change the CODE STATUS to DO NOT RESUSCITATE  Discharge planning Hopefully the patient can be discharged tomorrow. The plan will be to go home with home hospice based on family preference to take her home. She may need to be discharged to [facility] at some point in the future. I discussed prognosis with the family.  Heath Lark, MD 04/16/2016  10:37 AM

## 2016-04-16 NOTE — Care Management Note (Signed)
Case Management Note  Patient Details  Name: Natasha Jackson MRN: [id number] Date of Birth: 07/24/1925  Subjective/Objective:    82 yo admitted with Multiple myeloma                Action/Plan: Pt from home with spouse. CM consult for home with hospice. This CM met with daughter April for choice and [facility] was chosen. Referral called to [facility]. April also provided with private duty list. CM will continue to follow.  Expected Discharge Date:   (unknown)               Expected Discharge Plan:  Home w Hospice Care  In-House Referral:     Discharge planning Services  CM Consult  Post Acute Care Choice:  Hospice Choice offered to:  Adult Children  DME Arranged:    DME Agency:     HH Arranged:  Disease Management HH Agency:  [facility]  Status of Service:  In process, will continue to follow  If discussed at Long Length of Stay Meetings, dates discussed:    Additional Comments:  ,  H, RN 04/16/2016, 11:16 AM  [phone]  

## 2016-04-16 NOTE — Progress Notes (Signed)
Notified by [place], of family request for Hospice and [place] services at home after discharge.  Chart and patient information currently under review to confirm hospice eligibility.   Spoke with daughter, April Moran via telephone, to initiate education related to hospice philosophy, services and team approach to care.  April verbalized understanding of the information provided.  Patient will need prescriptions for discharge and comfort medications.   DME needs discussed and April requested a lightweight wheelchair only.   [facility] Chartered certified accountant, Gayland Curry, notified and will contact [place] to arrange delivery to the home. The home address has been verified and is [address] [place], [place]. [facility] is aware of the above.   Completed discharge summary will need to be faxed to [facility] at [phone] when final.   Please notify [facility] when patient is ready to leave unit at discharge--call [phone].   [facility] information and contact numbers have been given to April.   Above information shared with Alinda Sierras [facility].   Please call with any questions.  Thank you, Efrain Sella, RN, [facility] Liaison [phone]

## 2016-04-16 NOTE — Telephone Encounter (Signed)
Call from [facility] from [facility] to confirm Dr. Alvy Bimler will be the Attending for [place] orders.  Pt to be d/c'd home from hospital tomorrow w/ Hospice.   Notified Yvette Dr. Alvy Bimler will be the Attending.

## 2016-04-16 NOTE — Telephone Encounter (Signed)
Called to reschedule consult, left message for pt to return call. AW 

## 2016-04-16 NOTE — Progress Notes (Signed)
Physical Therapy Treatment Patient Details Name: Natasha Jackson MRN: [id number] DOB: [birth year] Today's Date: 04/16/2016    History of Present Illness 82 yo female admitted with uncontrolled pain. Hx of multiple myeloma-undergoing active tx, CAD, HTN, comp fx-VP T10 and T11, anxiety, PE    PT Comments    Pt continues to tolerate activity well during PT sessions. She appeared a little drowsy on today. Daughter present towards end of session. She states plan is still for pt to return home.   Follow Up Recommendations  Home health PT;Supervision/Assistance - 24 hour     Equipment Recommendations  None recommended by PT    Recommendations for Other Services       Precautions / Restrictions Precautions Precautions: Fall Restrictions Weight Bearing Restrictions: No    Mobility  Bed Mobility   Bed Mobility: Supine to Sit;Sit to Supine   Supine to sit: Supervision   Sit to supine: Supervision   General bed mobility comments: for safety  Transfers Overall transfer level: Needs assistance Equipment used: Rolling walker (2 wheeled) Transfers: Sit to/from Stand Sit to Stand: Min assist         General transfer comment: Assist to rise (especially from toilet), stabilize, control descent. VCs hand placement  Ambulation/Gait Ambulation/Gait assistance: Min assist Ambulation Distance (Feet): 120 Feet Assistive device: Rolling walker (2 wheeled) Gait Pattern/deviations: Step-through pattern;Decreased stride length     General Gait Details: slow gait speed. Assist to maneuver with RW intermittently. Occasional steering into objects in environment noted as well   Stairs            Wheelchair Mobility    Modified Rankin (Stroke Patients Only)       Balance                                    Cognition Arousal/Alertness: Awake/alert (a little drowsy) Behavior During Therapy: WFL for tasks assessed/performed Overall Cognitive Status: Within  Functional Limits for tasks assessed                      Exercises      General Comments        Pertinent Vitals/Pain Pain Assessment: Faces Faces Pain Scale: Hurts little more Pain Location: back Pain Descriptors / Indicators: Sore Pain Intervention(s): Monitored during session;Repositioned    Home Living                      Prior Function            PT Goals (current goals can now be found in the care plan section) Progress towards PT goals: Progressing toward goals    Frequency    Min 3X/week      PT Plan Current plan remains appropriate    Co-evaluation             End of Session Equipment Utilized During Treatment: Gait belt Activity Tolerance: Patient tolerated treatment well Patient left: in bed;with call bell/phone within reach;with family/visitor present;with bed alarm set     Time: 0953-1010 PT Time Calculation (min) (ACUTE ONLY): 17 min  Charges:  $Gait Training: 8-22 mins                    G Codes:      Weston Anna, MPT Pager: [phone]

## 2016-04-16 NOTE — Consult Note (Signed)
   [facility] Inpatient Consult   04/16/2016  THEODORA ELDRED 02-22-1926 [id number]    Patient screened for potential [facility] Care Management services. Chart reviewed. Noted current discharge plan is for home with hospice.  There are no identifiable [facility] Care Management needs at this time.  Marthenia Rolling, MSN-Ed, RN,BSN [facility] Liaison [phone]

## 2016-04-17 ENCOUNTER — Other Ambulatory Visit: Payer: Medicare Other

## 2016-04-17 ENCOUNTER — Ambulatory Visit: Payer: Medicare Other | Admitting: Hematology and Oncology

## 2016-04-17 ENCOUNTER — Other Ambulatory Visit: Payer: Self-pay | Admitting: Hematology and Oncology

## 2016-04-17 ENCOUNTER — Ambulatory Visit: Payer: Medicare Other

## 2016-04-17 DIAGNOSIS — M4850XD Collapsed vertebra, not elsewhere classified, site unspecified, subsequent encounter for fracture with routine healing: Secondary | ICD-10-CM

## 2016-04-17 DIAGNOSIS — C9002 Multiple myeloma in relapse: Secondary | ICD-10-CM

## 2016-04-17 DIAGNOSIS — IMO0001 Reserved for inherently not codable concepts without codable children: Secondary | ICD-10-CM

## 2016-04-17 DIAGNOSIS — F411 Generalized anxiety disorder: Secondary | ICD-10-CM

## 2016-04-17 MED ORDER — HALOPERIDOL LACTATE 2 MG/ML PO CONC: 0.6000 mg | ORAL | 0 refills | Status: AC | PRN

## 2016-04-17 MED ORDER — OXYCODONE HCL 10 MG/0.5ML PO CONC: 30.0000 mg | ORAL | 0 refills | Status: AC | PRN

## 2016-04-17 MED ORDER — OLANZAPINE 5 MG PO TABS: 5.0000 mg | ORAL_TABLET | Freq: Every day | ORAL | 9 refills | Status: AC

## 2016-04-17 MED ORDER — HEPARIN SOD (PORK) LOCK FLUSH 100 UNIT/ML IV SOLN
500.0000 [IU] | INTRAVENOUS | Status: AC | PRN
Start: 2016-04-17 — End: 2016-04-17
  Administered 2016-04-17: 500 [IU]
  Filled 2016-04-17: qty 5

## 2016-04-17 MED ORDER — DIAZEPAM 2 MG PO TABS: 1.0000 mg | ORAL_TABLET | Freq: Two times a day (BID) | ORAL | 0 refills | Status: DC

## 2016-04-17 MED ORDER — LORAZEPAM 2 MG/ML PO CONC: 1.0000 mg | ORAL | 0 refills | Status: AC | PRN

## 2016-04-17 NOTE — Discharge Summary (Signed)
Physician Discharge Summary  Patient ID: Natasha Jackson MRN: [id number] DOB/AGE: [birth year] 80 y.o.  Admit date: 04/13/2016 Discharge date: 04/17/2016  Primary Care Physician:  Thressa Sheller, MD   Discharge Diagnoses:    Present on Admission: . Multiple myeloma in relapse ([place]) . Vertebral compression fracture ([place]) . Depression . Chronic back pain greater than 3 months duration . Protein-calorie malnutrition, moderate ([place]) . Pulmonary embolism ([place]) . Anxiety attack . Physical deconditioning . Myeloma [facility])   Discharge Medications:  See medication reconciliation  Disposition and Follow-up:   Significant Diagnostic Studies:  Dg Chest 2 View  Result Date: 04/05/2016 CLINICAL DATA:  Shortness of breath. EXAM: CHEST  2 VIEW COMPARISON:  Radiographs of March 24, 2016. FINDINGS: Stable cardiomediastinal silhouette. Atherosclerosis of thoracic aorta is noted. Stable position of right-sided Port-A-Cath. No pneumothorax is noted. Status post kyphoplasty at 4 levels of lower thoracic spine. Mild bibasilar subsegmental atelectasis is noted with minimal pleural effusions. IMPRESSION: Aortic atherosclerosis. Mild bibasilar subsegmental atelectasis with minimal associated pleural effusions. Electronically Signed   By: Marijo Conception, M.D.   On: 04/05/2016 21:59   Dg Chest 2 View  Result Date: 03/24/2016 CLINICAL DATA:  Shortness of breath and back pain since this morning. Previous hospitalization 1 week ago with a cardiac event. EXAM: CHEST  2 VIEW COMPARISON:  03/16/2016 FINDINGS: Cardiac enlargement. Pulmonary vascularity is decreasing since previous study. No focal consolidation or airspace disease. Slight blunting of costophrenic angles may indicate small pleural effusions. Fibrosis in the lung apices. Apical pleural thickening bilaterally. Tortuous common dilated, and calcified aorta. Power port type central venous catheter with tip over right atrium. Multiple thoracic  vertebral compression deformities post kyphoplasty. IMPRESSION: Cardiac enlargement. Small bilateral pleural effusions. No significant vascular congestion or edema. Electronically Signed   By: Lucienne Capers M.D.   On: 03/24/2016 21:15   Ct Angio Chest Pe W And/or Wo Contrast  Result Date: 03/24/2016 CLINICAL DATA:  Acute onset of shortness of breath. Recent back surgery. Initial encounter. EXAM: CT ANGIOGRAPHY CHEST WITH CONTRAST TECHNIQUE: Multidetector CT imaging of the chest was performed using the standard protocol during bolus administration of intravenous contrast. Multiplanar CT image reconstructions and MIPs were obtained to evaluate the vascular anatomy. CONTRAST:  71.4 mL of Isovue 370 IV contrast COMPARISON:  Chest radiograph performed earlier today at 8:57 p.m., and CTA of the chest performed 08/03/2014 FINDINGS: Cardiovascular: Pulmonary embolus is noted within segmental branches to the right lower lobe, and within subsegmental branches to the left upper lobe. The RV/LV ratio is borderline normal, though this is difficult to assess given the patient's pectus excavatum. Diffuse coronary artery calcifications are seen. Mild biatrial enlargement is suggested. Mild calcification is noted along the aortic arch and descending thoracic aorta. The great vessels are grossly unremarkable in appearance. Mediastinum/Nodes: The patient's right-sided chest port is noted ending about the right atrium. No mediastinal lymphadenopathy is seen. No significant pericardial effusion is identified. The thyroid gland is diminutive, with a prominent right-sided calcification. No axillary lymphadenopathy is seen. Lungs/Pleura: Small right and trace left pleural effusions are noted, with bibasilar atelectasis or scarring. Underlying interstitial prominence raises concern for mild interstitial edema. No pneumothorax is seen. No masses are identified. Scarring is noted at the lung apices, with associated calcification. Upper  Abdomen: The visualized portions of the liver and spleen are grossly unremarkable. Reflux of contrast into the IVC is incidentally seen. Musculoskeletal: No acute osseous abnormalities are identified. There is mild chronic compression deformity of vertebral body T6,  and changes of vertebroplasty are noted at T9 through T12. The visualized musculature is unremarkable in appearance. Review of the MIP images confirms the above findings. IMPRESSION: 1. Pulmonary embolus within segmental branches to the right lower lung lobe, and within subsegmental branches to the left upper lobe. 2. Small right and trace left pleural effusions, with underlying interstitial prominence, concerning for mild interstitial edema. 3. Diffuse coronary artery calcifications seen. Mild biatrial enlargement noted. 4. Scarring at the lung apices, with associated calcification. 5. Mild chronic compression deformity of vertebral body T6, and changes of vertebroplasty at T9 through T12. Critical Value/emergent results were called by telephone at the time of interpretation on 03/24/2016 at 10:59 pm to Dr. Shary Decamp, who verbally acknowledged these results. Electronically Signed   By: Garald Balding M.D.   On: 03/24/2016 23:00   Ir Vertebroplasty Cerv/thor Bx Inc Uni/bil Inc/inject/imaging  Result Date: 03/23/2016 INDICATION: Severe thoracic pain secondary to compression fractures at T10 and T11. EXAM: IR VERTEBROPLASTY CERVICOTHORACIC INJ; IR VERTEBROPLASTY ADDL INJECTION MEDICATIONS: As antibiotic prophylaxis, 2 g Ancef IV was ordered pre-procedure and administered intravenously within 1 hour of incision. ANESTHESIA/SEDATION: Moderate (conscious) sedation was employed during this procedure. A total of Versed 2 mg and Fentanyl 75 mcg was administered intravenously. Moderate Sedation Time: 25 minutes. The patient's level of consciousness and vital signs were monitored continuously by radiology nursing throughout the procedure under my direct  supervision. FLUOROSCOPY TIME:  Fluoroscopy Time: 30 minutes 30 seconds (016 mGy) COMPLICATIONS: None immediate. TECHNIQUE: Informed written consent was obtained from the patient after a thorough discussion of the procedural risks, benefits and alternatives. All questions were addressed. Maximal Sterile Barrier Technique was utilized including caps, mask, sterile gowns, sterile gloves, sterile drape, hand hygiene and skin antiseptic. A timeout was performed prior to the initiation of the procedure. PROCEDURE: The patient was placed prone on the fluoroscopic table. Nasal oxygen was administered. Physiologic monitoring was performed throughout the duration of the procedure. The skin overlying the region was prepped and draped in the usual sterile fashion. The T10 and T11 vertebral bodies were identified and the right pedicle at T11, and left pedicle at T10 were then infiltrated with 0.25% bupivacaine. This was then followed by the advancement of a 13-gauge Cook needles through the respective pediclesinto the anterior one-third at T10 and T11. A gentle contrast injection demonstrated a trabecular pattern of contrast with early opacification of paraspinous venous structures. This necessitated the use of Gel-Foam pledgets to embolize the venous channels prior to the delivery of the methylmethacrylate mixture at both levels. At this time, methylmethacrylate mixture was reconstituted. Under biplane intermittent fluoroscopy, the methylmethacrylate was then injected into the T10 and T11 vertebral bodies with filling of the vertebral bodies. No extravasation was noted into the disk spaces or posteriorly into the spinal canal. No epidural venous contamination was seen. The needles were then removed. Hemostasis was achieved at the skin entry sites. There were no acute complications. Patient tolerated the procedure well. The patient was observed for 3 hours and discharged in good condition under the care of her daughter.  IMPRESSION: 1. Status post vertebral body augmentation for painful compression fractures at T10 and T11 using vertebroplasty technique. Electronically Signed   By: Luanne Bras M.D.   On: 03/20/2016 12:54   Ir Vertebroplasty Ea Addl (t&ls) Bx Inc Uni/bil Inc Inject/imaging  Result Date: 03/23/2016 INDICATION: Severe thoracic pain secondary to compression fractures at T10 and T11. EXAM: IR VERTEBROPLASTY CERVICOTHORACIC INJ; IR VERTEBROPLASTY ADDL INJECTION MEDICATIONS: As  antibiotic prophylaxis, 2 g Ancef IV was ordered pre-procedure and administered intravenously within 1 hour of incision. ANESTHESIA/SEDATION: Moderate (conscious) sedation was employed during this procedure. A total of Versed 2 mg and Fentanyl 75 mcg was administered intravenously. Moderate Sedation Time: 25 minutes. The patient's level of consciousness and vital signs were monitored continuously by radiology nursing throughout the procedure under my direct supervision. FLUOROSCOPY TIME:  Fluoroscopy Time: 30 minutes 30 seconds (637 mGy) COMPLICATIONS: None immediate. TECHNIQUE: Informed written consent was obtained from the patient after a thorough discussion of the procedural risks, benefits and alternatives. All questions were addressed. Maximal Sterile Barrier Technique was utilized including caps, mask, sterile gowns, sterile gloves, sterile drape, hand hygiene and skin antiseptic. A timeout was performed prior to the initiation of the procedure. PROCEDURE: The patient was placed prone on the fluoroscopic table. Nasal oxygen was administered. Physiologic monitoring was performed throughout the duration of the procedure. The skin overlying the region was prepped and draped in the usual sterile fashion. The T10 and T11 vertebral bodies were identified and the right pedicle at T11, and left pedicle at T10 were then infiltrated with 0.25% bupivacaine. This was then followed by the advancement of a 13-gauge Cook needles through the  respective pediclesinto the anterior one-third at T10 and T11. A gentle contrast injection demonstrated a trabecular pattern of contrast with early opacification of paraspinous venous structures. This necessitated the use of Gel-Foam pledgets to embolize the venous channels prior to the delivery of the methylmethacrylate mixture at both levels. At this time, methylmethacrylate mixture was reconstituted. Under biplane intermittent fluoroscopy, the methylmethacrylate was then injected into the T10 and T11 vertebral bodies with filling of the vertebral bodies. No extravasation was noted into the disk spaces or posteriorly into the spinal canal. No epidural venous contamination was seen. The needles were then removed. Hemostasis was achieved at the skin entry sites. There were no acute complications. Patient tolerated the procedure well. The patient was observed for 3 hours and discharged in good condition under the care of her daughter. IMPRESSION: 1. Status post vertebral body augmentation for painful compression fractures at T10 and T11 using vertebroplasty technique. Electronically Signed   By: Luanne Bras M.D.   On: 03/20/2016 12:54    Discharge Laboratory Values: Lab Results  Component Value Date   WBC 3.2 (L) 04/14/2016   HGB 8.1 (L) 04/14/2016   HCT 24.3 (L) 04/14/2016   MCV 106.1 (H) 04/14/2016   PLT 181 04/14/2016   Lab Results  Component Value Date   NA 134 (L) 04/14/2016   K 3.8 04/14/2016   CL 100 (L) 04/14/2016   CO2 28 04/14/2016    Brief H and P: For complete details please refer to admission H and P, but in brief, The patient was admitted for symptom management due to uncontrolled pain and severe anxiety. Palliative care team was consulted and significant medication modifications were introduced. At the time of discharge, her pain appears to be under control and anxiety level is under good control. Goals of care were discussed extensively. The patient will be discharged  with home-based palliative care/hospice care. CODE STATUS is DO NOT RESUSCITATE. Chemotherapy was discontinued and her overall care is transitioned slowly towards comfort measures  Physical Exam at Discharge: BP 135/82 (BP Location: Left Arm)   Pulse 66   Temp 97.6 F (36.4 C) (Oral)   Resp 20   SpO2 94%  GENERAL:alert, no distress and comfortable SKIN: skin color, texture, turgor are normal, no rashes  or significant lesions EYES: normal, Conjunctiva are pink and non-injected, sclera clear Musculoskeletal:no cyanosis of digits and no clubbing  NEURO: alert but not fully oriented with fluent speech, no focal motor/sensory deficits  Hospital Course:  Active Problems:   Multiple myeloma in relapse (H[facility])   Vertebral compression fracture (H[facility])   Depression   Chronic back pain greater than 3 months duration   Protein-calorie malnutrition, moderate (H[facility])   Pulmonary embolism (H[facility])   Anxiety attack   Physical deconditioning   Myeloma ([place])   Generalized anxiety disorder   Diet:  Regular  Activity:  As tolerated  Condition at Discharge:   stable  Signed: Dr. Heath Lark [phone]  04/17/2016, 8:40 AM

## 2016-04-17 NOTE — Progress Notes (Signed)
Daily Progress Note   Patient Name: Natasha Jackson       Date: 04/17/2016 DOB: 05-29-1926  Age: 82 y.o. MRN#: [id number] Attending Physician: Natasha Lark, MD Primary Care Physician: Natasha Sheller, MD Admit Date: 04/13/2016  Reason for Consultation/Follow-up: Establishing goals of care, Hospice Evaluation, Non pain symptom management, Pain control and Psychosocial/spiritual support  Subjective: I met this AM with patient and her daughter, Natasha Jackson.  [name] was awake and alert and reports being ready to go home.  Her daughter reports that her mother was originally upset for plan to go home with hospice, but she is now in agreement after discussing that the goal is to transition home to live as well as possible for whatever time she has left, rather than "just sending me home to die."  Patient reports that her symptoms are currently well controlled.  She appears less anxious this AM.  I reviewed in detail plan for medications at home with patient and her daughter.    Length of Stay: 4  Current Medications: Scheduled Meds:  . apixaban  5 mg Oral BID  . cholecalciferol  1,000 Units Oral Daily  . dexamethasone  1 mg Oral Daily  . diazepam  1 mg Oral BID  . feeding supplement (ENSURE ENLIVE)  237 mL Oral BID BM  . folic acid  1 mg Oral Daily  . gabapentin  300 mg Oral QHS  . levothyroxine  50 mcg Oral QAC breakfast  . morphine  60 mg Oral Q12H  . multivitamin with minerals  1 tablet Oral Daily  . OLANZapine  5 mg Oral QHS  . senna-docusate  1 tablet Oral BID  . cyanocobalamin  1,000 mcg Oral Daily    Continuous Infusions:    PRN Meds: acetaminophen, alum & mag hydroxide-simeth, artificial tears, haloperidol lactate **OR** haloperidol, HYDROmorphone (DILAUDID) injection, LORazepam **OR**  LORazepam, nitroGLYCERIN, ondansetron **OR** ondansetron **OR** ondansetron (ZOFRAN) IV **OR** ondansetron (ZOFRAN) IV, oxyCODONE, polyethylene glycol, sodium chloride flush  Physical Exam     General: Alert, awake. Eating breakfast HEENT: No bruits, no goiter, no JVD Heart: Regular rate and rhythm. No murmur appreciated. Lungs: Fair air movement, clear Abdomen: Soft, nontender, nondistended, positive bowel sounds.  Ext: No significant edema Skin: Warm and dry Neuro: Grossly intact, nonfocal.      Vital Signs: BP 135/82 (  BP Location: Left Arm)   Pulse 66   Temp 97.6 F (36.4 C) (Oral)   Resp 20   SpO2 94%  SpO2: SpO2: 94 % O2 Device: O2 Device: Not Delivered O2 Flow Rate:    Intake/output summary:   Intake/Output Summary (Last 24 hours) at 04/17/16 1039 Last data filed at 04/17/16 0935  Gross per 24 hour  Intake              240 ml  Output                0 ml  Net              240 ml   LBM: Last BM Date: 04/16/16 Baseline Weight:   Most recent weight:         Palliative Assessment/Data: 30%   Flowsheet Rows   Flowsheet Row Most Recent Value  Intake Tab  Referral Department  Oncology  Unit at Time of Referral  Oncology Unit  Palliative Care Primary Diagnosis  Cancer  Date Notified  04/13/16  Palliative Care Type  New Palliative care  Reason for referral  Clarify Goals of Care, Counsel Regarding Hospice, End of [place]  Date of Admission  04/13/16  Date first seen by Palliative Care  04/15/16  # of days Palliative referral response time  2 Day(s)  # of days IP prior to Palliative referral  0  Clinical Assessment  Palliative Performance Scale Score  40%  Pain Max last 24 hours  6  Pain Min Last 24 hours  0  Psychosocial & Spiritual Assessment  Palliative Care Outcomes  Patient/Family meeting held?  Yes  Who was at the meeting?  Patient, husband, daughter  Palliative Care Outcomes  Counseled regarding hospice      Patient Active Problem List    Diagnosis Date Noted  . Generalized anxiety disorder 04/14/2016  . Myeloma ([place]) 04/13/2016  . Physical deconditioning 04/08/2016  . Diarrhea 04/03/2016  . Insomnia due to stress 03/31/2016  . Anxiety attack 03/31/2016  . Goals of care, counseling/discussion 03/31/2016  . Acute respiratory failure with hypoxia ([place]) 03/25/2016  . Pulmonary embolus ([place]) 03/25/2016  . Pulmonary embolism ([place]) 03/24/2016  . Dizziness 03/12/2016  . Palliative care encounter 02/07/2016  . Dysphagia 01/23/2016  . Protein-calorie malnutrition, moderate ([place]) 01/23/2016  . GERD (gastroesophageal reflux disease) 01/09/2016  . Anemia in neoplastic disease 01/09/2016  . Constipation due to opioid therapy 10/21/2015  . Multiple myeloma ([place]) 10/11/2015  . Chronic back pain greater than 3 months duration 09/26/2015  . Left leg pain 11/22/2014  . Rib pain on left side 11/22/2014  . Hypothyroidism 10/18/2014  . Neutropenia ([place]) 09/20/2014  . Chest pain 09/20/2014  . Depression 09/05/2014  . Arthropathy   . Dehydration, moderate 08/01/2014  . Vertebral compression fracture ([place]) 07/19/2014  . Pancytopenia due to antineoplastic chemotherapy ([place]) 07/05/2014  . Rib pain on right side 06/18/2014  . Bradycardia 09/22/2012  . DCIS (ductal carcinoma in situ) 08/04/2012  . Hot flash not due to menopause 12/23/2011  . Neuropathy due to chemotherapeutic drug ([place]) 12/23/2011  . OTHER SPECIFIED IDIOPATHIC PERIPHERAL NEUROPATHY 06/25/2010  . LBBB (left bundle branch block) 06/05/2009  . Multiple myeloma in relapse ([place]) 01/04/2009  . Essential hypertension, benign 01/04/2009  . DERMATITIS 01/03/2009  . ARTHRITIS 01/03/2009  . POSTMENOPAUSAL STATUS 01/03/2009    Palliative Care Assessment & Plan   Patient Profile: 82 yo female with multiple myeloma  Recommendations/Plan: Plan for d/c home  with hospice this AM.  Reviewed plan for symptom management at home with patient and her daughter. For pain:  MS  Contin 15m BID (for as long as she can tolerate pills) Oxycodone Concentrate 135m0.5ml: 3080m1.5ml37mublingual every 1 hour as needed for pain or shortness of breath: Disp 30ml28mr anxiety:  Diazepam 1mg B46m(for as long as she can tolerate pills) Zyprexa 5mg ni14mly Lorazepam 2mg/ml 63mcentrated solution: 1mg (0.536m subl13mual every 4 hours as needed for anxiety: Disp 30ml  For 2mation:  Haldol 2mg/ml solu57mn: 0.5mg (0.25ml)21mbling53mevery 4 hours as needed for agitation or nausea: Disp 30ml     Goals60mCare and Additional Recommendations:  Limitations on Scope of Treatment: Full Comfort Care  Code Status:    Code Status Orders        Start     Ordered   04/13/16 1327  Do not attempt resuscitation (DNR)  Continuous    Question Answer Comment  In the event of cardiac or respiratory ARREST Do not call a "code blue"   In the event of cardiac or respiratory ARREST Do not perform Intubation, CPR, defibrillation or ACLS   In the event of cardiac or respiratory ARREST Use medication by any route, position, wound care, and other measures to relive pain and suffering. May use oxygen, suction and manual treatment of airway obstruction as needed for comfort.      04/13/16 1326    Code Status History    Date Active Date Inactive Code Status Order ID Comments User Context   03/25/2016  1:05 AM 03/29/2016  4:37 PM Partial Code [id number]  [id number] MLily Kocher  03/16/2016  3:51 PM 03/17/2016  5:49 PM Partial Code [id number]  [id number] Stanfordt   03/16/2016  1:19 PM 03/16/2016  3:50 PM Partial Code [id number]  [id number], Belva Crome2016  1:54 AM 09/21/2014  8:43 PM Full Code [id number]  [id number],Lavina Hamman  08/01/2014  3:56 PM 08/05/2014  2:42 PM Full Code [id number]  [id number] MReyne Dumas2016 11:17 AM 08/01/2014  3:37 AM Full Code [id number]  [id number] Hickman2/2015  3:34 PM 06/27/2014  3:38 AM Full Code [id number]  [id number] Sandi Mariscaldvance Directive Documentation   Flowsheet Row Most Recent Value  Type of Advance Directive  Healthcare Power of Attorney, Living will  Pre-existing out of facility DNR order (yellow form or pink MOST form)  No data  "MOST" Form in Place?  No data       Prognosis: Days to weeks.  Her mood appears improved this morning, however, I anticipate that her condition will continue to decline quickly. Goal is to transition home with support of hospice for as long as they can maintain. They are interested in transitioning to inpatient hospice after she has been able to go to her own home for a period of time. I do think she would be well served by residential hospice placement at some point in the future as she will likely require titration of medications for anxiety and pain as her condition worsens.  Discharge Planning:  Home with Hospice  Care plan was discussed with patient, family, bedside RN, Dr. Gorsuch  Thank Alvy Bimler allowing the Palliative Medicine Team to assist in the care of this patient.   Time In: 0930 Time Out: 1000 Total Time 30 Prolonged Time Billed No  Greater than 50%  of this time was spent counseling and coordinating care related to the above assessment and plan.  Micheline Rough, MD  Please contact Palliative Medicine Team phone at [phone] for questions and concerns.

## 2016-04-21 ENCOUNTER — Ambulatory Visit (HOSPITAL_COMMUNITY): Payer: Medicare Other

## 2016-04-23 ENCOUNTER — Telehealth: Payer: Self-pay | Admitting: *Deleted

## 2016-04-23 MED ORDER — MORPHINE SULFATE ER 60 MG PO TBCR: 60.0000 mg | EXTENDED_RELEASE_TABLET | Freq: Two times a day (BID) | ORAL | 0 refills | Status: DC

## 2016-04-23 NOTE — Telephone Encounter (Signed)
Call from Hospice RN states pt needs new Rx MS Contin 60 mg faxed to [facility] in [employer] on [address].  Rx faxed and notified Hospice RN, Ruffin Frederick [phone].

## 2016-05-01 ENCOUNTER — Telehealth: Payer: Self-pay | Admitting: *Deleted

## 2016-05-01 NOTE — Telephone Encounter (Signed)
VM from pt's Caregiver, Sharyn Lull, asking if pt should have a flu Shot?  Called back and informed caregiver that it is ok w/ Dr. Alvy Bimler for pt to have Flu Shot if she wants it.  There is no contraindication for Flu Shot.  Pt will schedule it at her PCP since it is closer to her and more convenient.

## 2016-05-02 ENCOUNTER — Other Ambulatory Visit: Payer: Self-pay | Admitting: Hematology and Oncology

## 2016-05-04 ENCOUNTER — Other Ambulatory Visit: Payer: Self-pay | Admitting: Hematology and Oncology

## 2016-05-04 ENCOUNTER — Telehealth: Payer: Self-pay | Admitting: *Deleted

## 2016-05-04 MED ORDER — DIAZEPAM 2 MG PO TABS: 1.0000 mg | ORAL_TABLET | Freq: Two times a day (BID) | ORAL | 1 refills | Status: AC

## 2016-05-04 NOTE — Telephone Encounter (Signed)
Refill request for Dex and Valium received from [facility].  Called pt's daughter, April, to confirm need for refill and current doses.  April confirms pt taking Dex 1 mg daily and Valium 1 mg (1/2 2mg  tablet) twice a day.  April says pt's symptoms are well managed at this time.  She is taking a little more pain medication but her anxiety is much better.  Refills for Valium and Dex [facility] into [facility].  Instructed April to contact Hospice for refills in the future to allow them to manage pt's medications. She verbalized understanding.

## 2016-05-04 NOTE — Telephone Encounter (Signed)
I keep receiving multiple refills requests Is hospice taking care of this?

## 2016-05-13 ENCOUNTER — Telehealth: Payer: Self-pay | Admitting: *Deleted

## 2016-05-13 NOTE — Telephone Encounter (Signed)
Received call from [facility] @ [facility] re:  Pt has not had good pain management.  Pt takes MS Contin 60 mg every 12 hours ; however, pt has been taking a lot of breakthrough pain meds during the day.   Pt also is taking Ativan every 4 hours - which may cause memory loss.    Informed Nunzio Cory that Dr. Alvy Bimler is not in office today.  Asked Nunzio Cory to have hospice physician to assist with pt's pain management.  Nunzio Cory voiced understanding. [employer]   Phone     [phone].

## 2016-05-14 ENCOUNTER — Telehealth: Payer: Self-pay | Admitting: *Deleted

## 2016-05-14 MED ORDER — OXYCODONE HCL 30 MG PO TABS: ORAL_TABLET | ORAL | 0 refills | Status: AC

## 2016-05-14 MED ORDER — MORPHINE SULFATE ER 60 MG PO TBCR: 60.0000 mg | EXTENDED_RELEASE_TABLET | Freq: Three times a day (TID) | ORAL | 0 refills | Status: AC

## 2016-05-14 NOTE — Telephone Encounter (Signed)
Dr. Alvy Bimler recommends increasing MS Contin to 60 mg TID and Oxycodone 30 mg pt may take up to 2 tabs every 4 hrs as needed for pain.  Suggested pt try 1 1/2 tabs of oxycodone if 1 is not helping.    I left VM for [facility].  Informed her of new orders and to discuss w/ pt's family.  Will fax new Rxs to [facility].

## 2016-05-14 NOTE — Telephone Encounter (Signed)
Called Hospice RN, Nunzio Cory, to ask if Hospice MD is willing to manage pt's pain and anxiety?  Dr. Alvy Bimler can see pt in office today if she needs to manage pt's symptoms.   Nunzio Cory says she is going to ask Hospice MD today to help manage pt's symptoms.  Informed Nunzio Cory that Dr. Alvy Bimler really wants to make sure pt's symptoms are managed well and she does not want pt to be having uncontrolled pain.  Please let us know if Hospice MD cannot help and if Dr. Alvy Bimler needs to see pt today.   Nunzio Cory says she will speak w/ Hospice MD and call us back if needed.

## 2016-05-14 NOTE — Telephone Encounter (Signed)
Hospice RN states Dr. Lyman Speller says he prefers Dr. Alvy Bimler manage symptoms/ pain and anxiety at this time.  He does not know pt and would not be able to see pt until next week.   Hospice RN wants to clarify she thinks pt's pain is managed ok.  She just wanted Dr. Alvy Bimler to be aware pt is taking a lot of Breakthrough pain meds and possibly increasing MS Contin dose would be indicated?

## 2016-05-14 NOTE — Telephone Encounter (Signed)
Long discussion w/ pt's daughter, April, regarding pain and anxiety medications.  Informed her Dr. Alvy Bimler offered to see pt in office today if pt would want to see her in person.   After much discussion it was decided pt will try change in medications as prescribed.  Rxs faxed to [facility].   April will call back if meds not effective.  Notified Hospice RN of new orders.

## 2016-06-01 ENCOUNTER — Telehealth: Payer: Self-pay | Admitting: *Deleted

## 2016-06-01 NOTE — Telephone Encounter (Signed)
Call from [place], [place] [phone],  Reports   1.  Pt has had diarrhea for past 24 hrs.  She has not taken any imodium because she thinks it is from taking laxatives although pt last took Senokot- S 4 days ago.   Instructed for pt to take imodium as needed per Dr. Alvy Bimler if diarrhea does not resolve on it's own.  Also encourage pt to drink plenty of fluids to prevent dehydration.  Let us know if diarrhea does not resolve.  2. Daughter, April, asking how to know if pt has any more fractures in her back and what we can do about it?  Discussed that pt would need to follow up w/ Dr. Estanislado Pandy is she is interested in any procedures for her back.  Pt may not be a candidate for any procedures due to her weakened condition.  They can call to make appt to see Dr. Estanislado Pandy to discuss and Dr. Estanislado Pandy might need pt to have another MRI to determine if any new fractures.  All this may be too uncomfortable for pt to tolerate for possibly little if any benefit.  Hospice RN will discuss w/ April and patient if they really want to pursue then Hospice would have to approve.

## 2016-06-02 ENCOUNTER — Other Ambulatory Visit: Payer: Self-pay | Admitting: Hematology and Oncology

## 2016-06-02 ENCOUNTER — Other Ambulatory Visit (HOSPITAL_COMMUNITY): Payer: Self-pay | Admitting: Interventional Radiology

## 2016-06-02 DIAGNOSIS — Z8781 Personal history of (healed) traumatic fracture: Secondary | ICD-10-CM

## 2016-06-03 ENCOUNTER — Telehealth: Payer: Self-pay | Admitting: *Deleted

## 2016-06-03 NOTE — Telephone Encounter (Signed)
Nunzio Cory, Hospice RN,  Called to notify pt's daughter, April, reporting pt's pain is not controlled.  Apparently pt called April last night crying with c/o increased back pain. Taking MS Contin 60 mg TID and Oxycodone 30 mg, 2 tablets every 4 hrs.   Informed Nunzio Cory Dr. Alvy Bimler would really appreciate Hospice MD to manage pain if possible.  Dr. Alvy Bimler would need to see pt in office to assess her pain, but would be easier on pt if Hospice MD could make a home visit.  Dr. Alvy Bimler is also out of office today.   Nunzio Cory states Dr. Lyman Speller and Dr. Tomasa Hosteller aware of pt's pain and agree to manage pain at this time.  They are discussing possible [facility] for pain management.  Nunzio Cory will let us know what they decide.

## 2016-06-04 ENCOUNTER — Telehealth: Payer: Self-pay | Admitting: *Deleted

## 2016-06-04 NOTE — Telephone Encounter (Signed)
VM from [place], Hospice RN reporting pt has been transferred from home to [facility] for [facility].  Dr. Alvy Bimler notified.

## 2016-06-16 ENCOUNTER — Ambulatory Visit (HOSPITAL_COMMUNITY): Admission: RE | Admit: 2016-06-16 | Payer: Medicare Other | Source: Ambulatory Visit

## 2016-06-22 ENCOUNTER — Telehealth: Payer: Self-pay | Admitting: *Deleted

## 2016-06-22 NOTE — Telephone Encounter (Signed)
VM from April to inform us that pt passed away last night.  There will be a Service for her at their [place], [place] on [address] [employer], this Wednesday at 68 am.  Viewing at 10 am.   April expressed her thanks to Dr. Alvy Bimler for taking care of her mother and "doing everything she could."

## 2016-06-26 ENCOUNTER — Other Ambulatory Visit: Payer: Self-pay | Admitting: Nurse Practitioner

## 2016-07-06 DEATH — deceased

## 2017-11-11 IMAGING — CT CT ANGIO CHEST
2 of 6 series · 18 of 36 positions shown · IV contrast (ISOVUE 370)
Comparison: Chest radiograph performed earlier today at [DATE] p.m.,
and CTA of the chest performed 08/03/2014

CLINICAL DATA: Acute onset of shortness of breath. Recent back
surgery. Initial encounter.

EXAM:
CT ANGIOGRAPHY CHEST WITH CONTRAST
TECHNIQUE: Multidetector CT imaging of the chest was performed using the
standard protocol during bolus administration of intravenous
contrast. Multiplanar CT image reconstructions and MIPs were
obtained to evaluate the vascular anatomy.
CONTRAST:  71.4 mL of Isovue 370 IV contrast

[Series 6: thins for pacs · axial · 0.56mm/px · z∈[+1236,+1444]mm · 17 of 232 slices shown]
[im 12/232  lung]
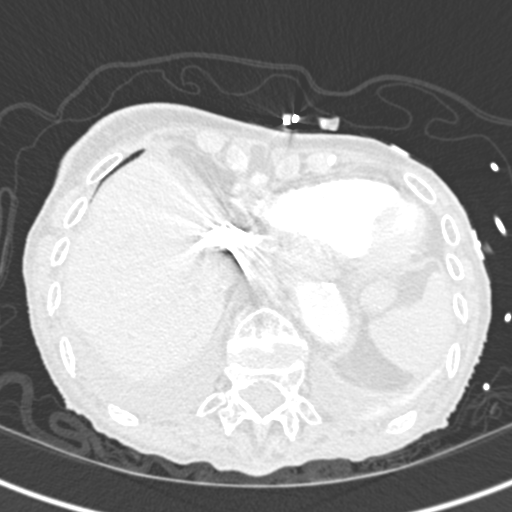
[im 24/232  mediastinal]
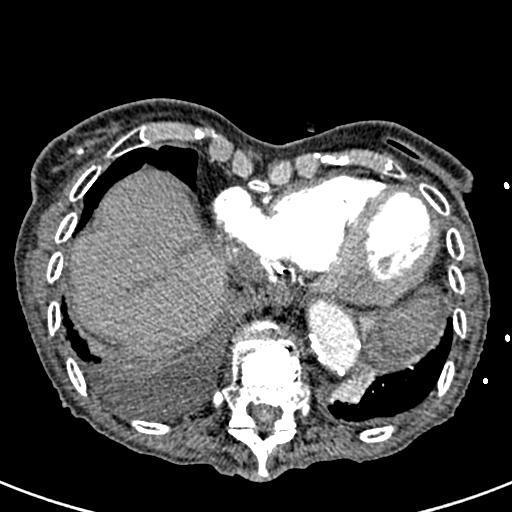
[im 35/232  lung]
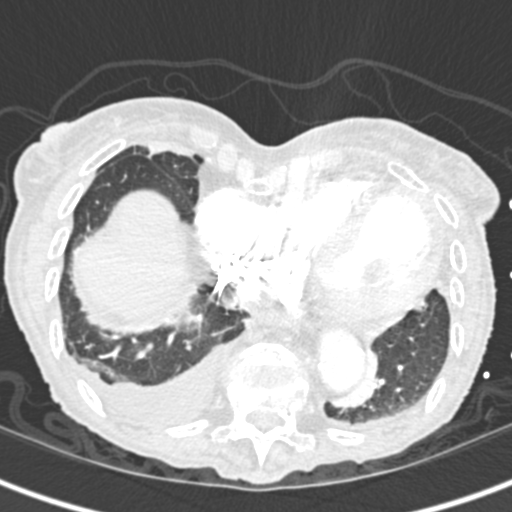
[im 47/232  mediastinal]
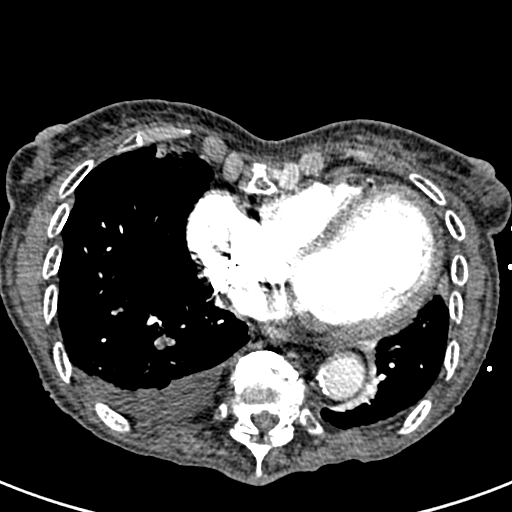
[im 70/232  lung]
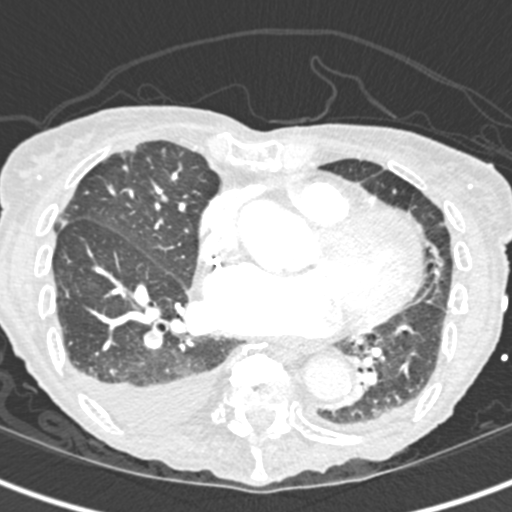
[im 81/232  mediastinal]
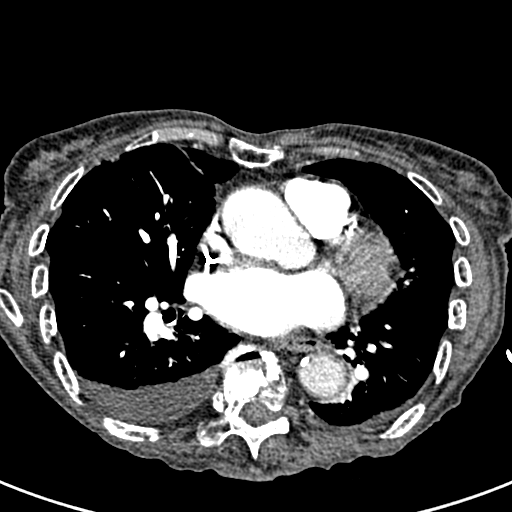
[im 93/232  lung]
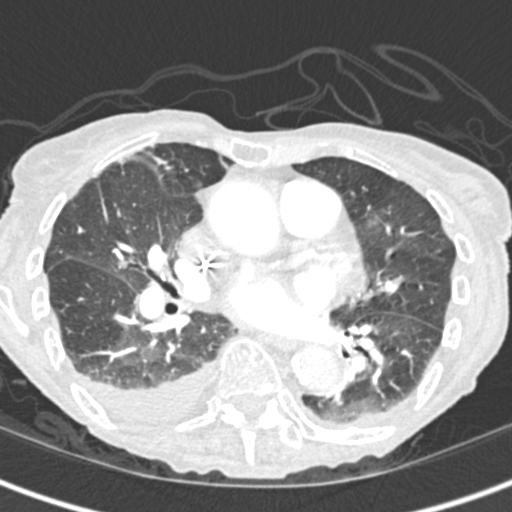
[im 104/232  mediastinal]
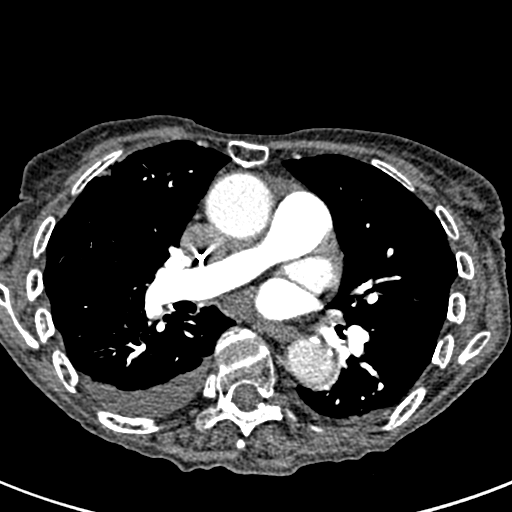
[im 116/232  lung]
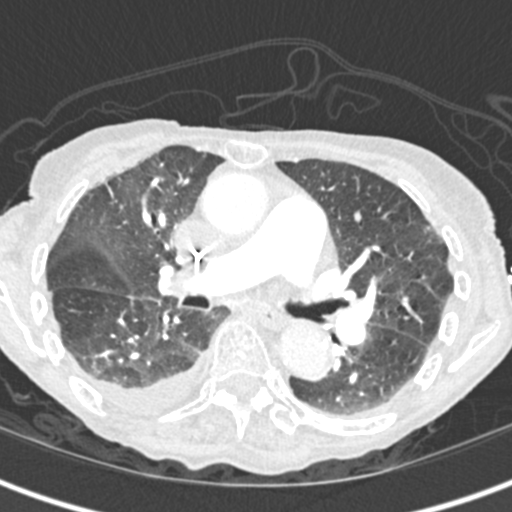
[im 128/232  mediastinal]
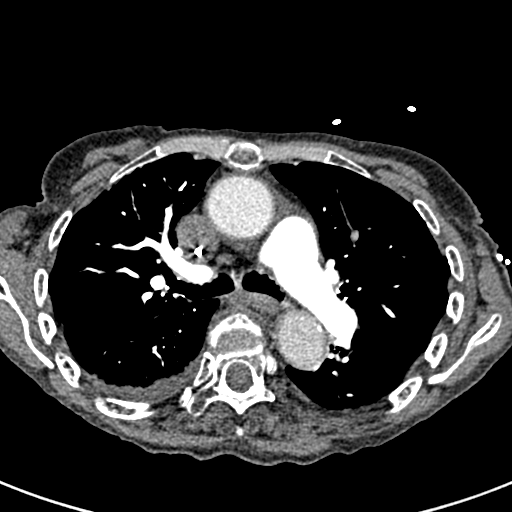
[im 139/232  lung]
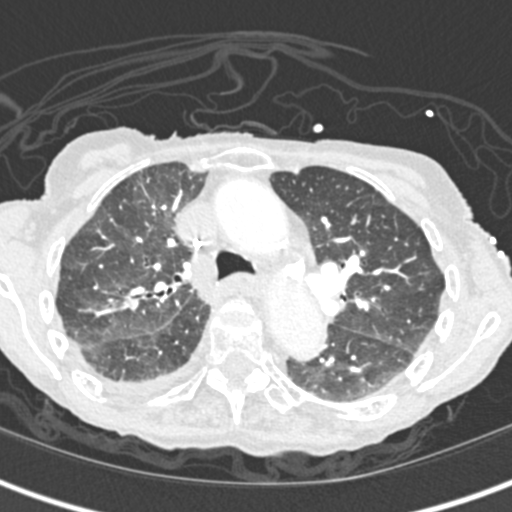
[im 151/232  mediastinal]
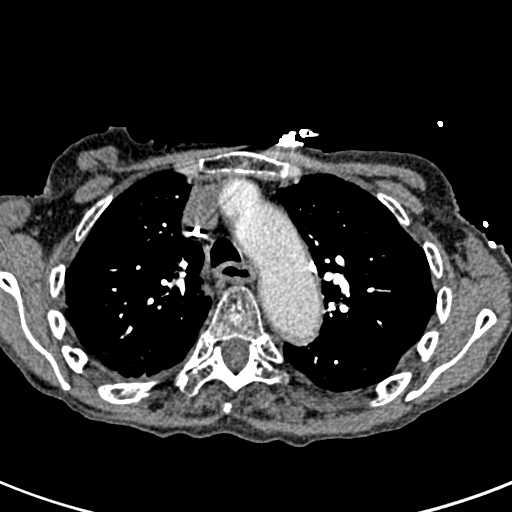
[im 162/232  lung]
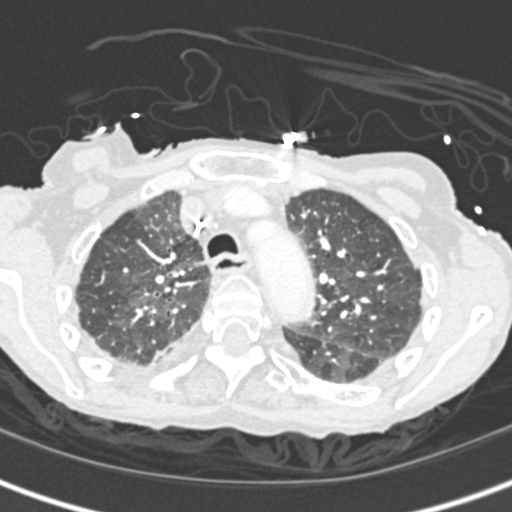
[im 185/232  mediastinal]
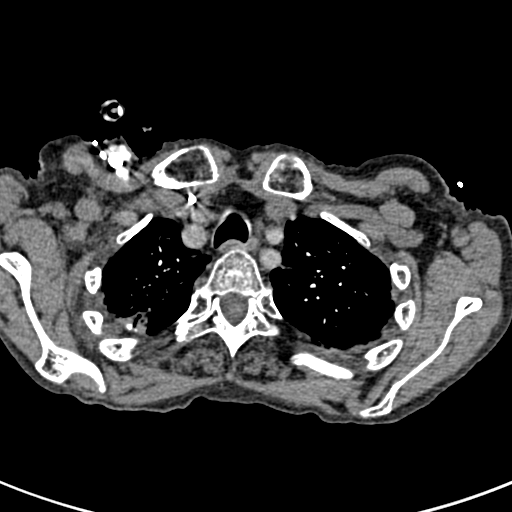
[im 197/232  lung]
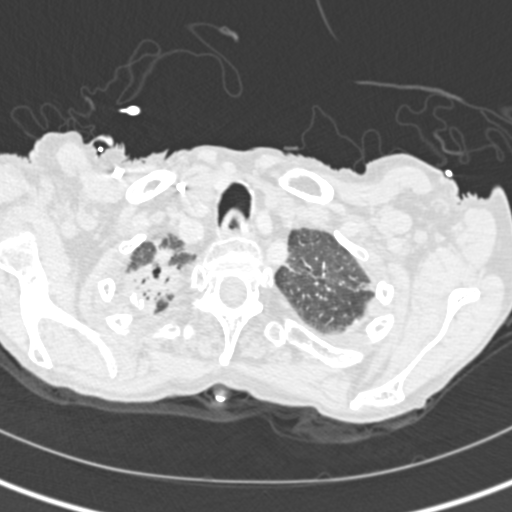
[im 208/232  mediastinal]
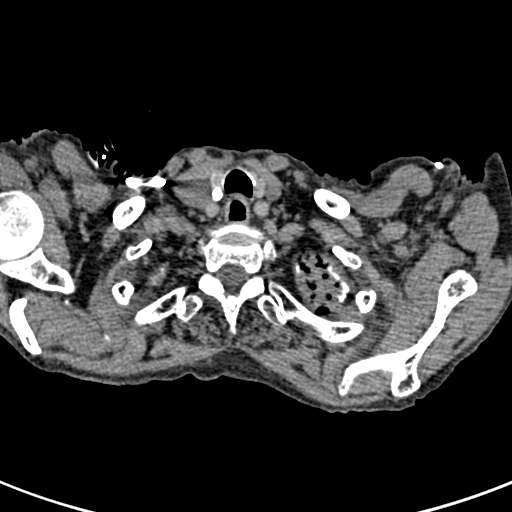
[im 220/232  lung]
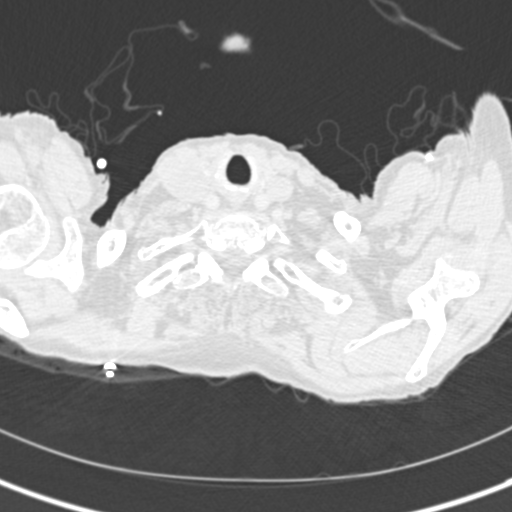

[Series 8: coronal mpr · coronal · 0.46mm/px · 1 of 95 slices shown]
[im 48/95  mediastinal]
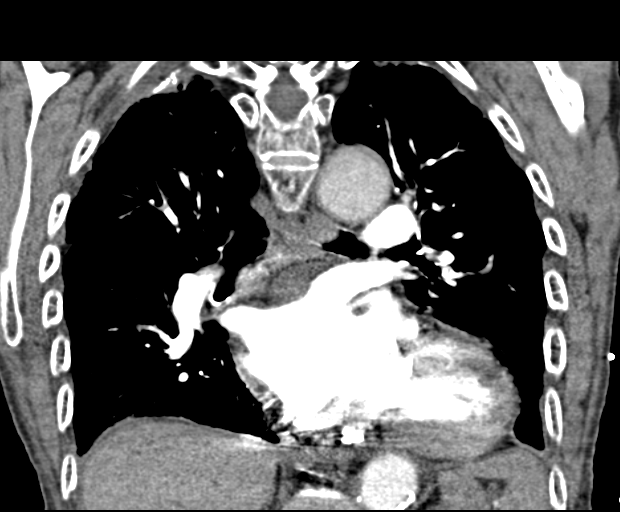

[18 of 36 positions shown; findings below may reference images not displayed]

FINDINGS: Cardiovascular: Pulmonary embolus is noted within segmental branches
to the right lower lobe, and within subsegmental branches to the
left upper lobe. The RV/LV ratio is borderline normal, though this
is difficult to assess given the patient's pectus excavatum.

Diffuse coronary artery calcifications are seen. Mild biatrial
enlargement is suggested. Mild calcification is noted along the
aortic arch and descending thoracic aorta. The great vessels are
grossly unremarkable in appearance.

Mediastinum/Nodes: The patient's right-sided chest port is noted
ending about the right atrium. No mediastinal lymphadenopathy is
seen. No significant pericardial effusion is identified. The thyroid
gland is diminutive, with a prominent right-sided calcification. No
axillary lymphadenopathy is seen.

Lungs/Pleura: Small right and trace left pleural effusions are
noted, with bibasilar atelectasis or scarring. Underlying
interstitial prominence raises concern for mild interstitial edema.
No pneumothorax is seen. No masses are identified. Scarring is noted
at the lung apices, with associated calcification.

Upper Abdomen: The visualized portions of the liver and spleen are
grossly unremarkable. Reflux of contrast into the IVC is
incidentally seen.

Musculoskeletal: No acute osseous abnormalities are identified.
There is mild chronic compression deformity of vertebral body T6,
and changes of vertebroplasty are noted at T9 through T12. The
visualized musculature is unremarkable in appearance.

Review of the MIP images confirms the above findings.
IMPRESSION: 1. Pulmonary embolus within segmental branches to the right lower
lung lobe, and within subsegmental branches to the left upper lobe.
2. Small right and trace left pleural effusions, with underlying
interstitial prominence, concerning for mild interstitial edema.
3. Diffuse coronary artery calcifications seen. Mild biatrial
enlargement noted.
4. Scarring at the lung apices, with associated calcification.
5. Mild chronic compression deformity of vertebral body T6, and
changes of vertebroplasty at T9 through T12.
Critical Value/emergent results were called by telephone at the time
of interpretation on 03/24/2016 at [DATE] to Dr. MARCELL DELAFUENTE, who
verbally acknowledged these results.
